# Patient Record
Sex: Female | Born: 1970 | Race: White | Hispanic: No | Smoking: Current every day smoker
Health system: Southern US, Community
[De-identification: ages and names within clinical notes are randomized; demographics above are authoritative.]

## PROBLEM LIST (undated history)

## (undated) DIAGNOSIS — N200 Calculus of kidney: Secondary | ICD-10-CM

## (undated) DIAGNOSIS — I219 Acute myocardial infarction, unspecified: Secondary | ICD-10-CM

## (undated) DIAGNOSIS — M1990 Unspecified osteoarthritis, unspecified site: Secondary | ICD-10-CM

## (undated) DIAGNOSIS — M546 Pain in thoracic spine: Secondary | ICD-10-CM

## (undated) DIAGNOSIS — I509 Heart failure, unspecified: Secondary | ICD-10-CM

## (undated) DIAGNOSIS — J439 Emphysema, unspecified: Secondary | ICD-10-CM

## (undated) DIAGNOSIS — G8929 Other chronic pain: Secondary | ICD-10-CM

## (undated) DIAGNOSIS — C801 Malignant (primary) neoplasm, unspecified: Secondary | ICD-10-CM

## (undated) DIAGNOSIS — M542 Cervicalgia: Secondary | ICD-10-CM

## (undated) DIAGNOSIS — M549 Dorsalgia, unspecified: Secondary | ICD-10-CM

## (undated) DIAGNOSIS — E119 Type 2 diabetes mellitus without complications: Secondary | ICD-10-CM

## (undated) DIAGNOSIS — R52 Pain, unspecified: Secondary | ICD-10-CM

## (undated) DIAGNOSIS — I2510 Atherosclerotic heart disease of native coronary artery without angina pectoris: Secondary | ICD-10-CM

## (undated) DIAGNOSIS — M797 Fibromyalgia: Secondary | ICD-10-CM

## (undated) DIAGNOSIS — M79642 Pain in left hand: Secondary | ICD-10-CM

## (undated) DIAGNOSIS — I10 Essential (primary) hypertension: Secondary | ICD-10-CM

## (undated) DIAGNOSIS — M79641 Pain in right hand: Secondary | ICD-10-CM

## (undated) DIAGNOSIS — M5412 Radiculopathy, cervical region: Secondary | ICD-10-CM

## (undated) HISTORY — DX: Atherosclerotic heart disease of native coronary artery without angina pectoris: I25.10

## (undated) HISTORY — PX: CARDIAC SURGERY: SHX584

## (undated) HISTORY — PX: TUBAL LIGATION: SHX77

## (undated) HISTORY — DX: Emphysema, unspecified: J43.9

## (undated) HISTORY — PX: LITHOTRIPSY: SUR834

## (undated) HISTORY — PX: SPINAL FUSION: SHX223

---

## 2008-02-07 ENCOUNTER — Emergency Department (HOSPITAL_COMMUNITY): Admission: EM | Admit: 2008-02-07 | Discharge: 2008-02-07 | Payer: Self-pay | Admitting: Emergency Medicine

## 2008-02-14 ENCOUNTER — Ambulatory Visit (HOSPITAL_COMMUNITY): Admission: RE | Admit: 2008-02-14 | Discharge: 2008-02-14 | Payer: Self-pay | Admitting: Urology

## 2008-03-29 ENCOUNTER — Ambulatory Visit (HOSPITAL_COMMUNITY): Admission: RE | Admit: 2008-03-29 | Discharge: 2008-03-29 | Payer: Self-pay | Admitting: Urology

## 2010-08-01 ENCOUNTER — Encounter: Payer: Self-pay | Admitting: Orthopedic Surgery

## 2010-08-20 ENCOUNTER — Encounter: Payer: Self-pay | Admitting: Orthopedic Surgery

## 2010-09-19 ENCOUNTER — Encounter: Payer: Self-pay | Admitting: Orthopedic Surgery

## 2010-10-20 ENCOUNTER — Encounter: Payer: Self-pay | Admitting: Orthopedic Surgery

## 2010-11-20 ENCOUNTER — Encounter: Payer: Self-pay | Admitting: Orthopedic Surgery

## 2010-12-19 ENCOUNTER — Encounter: Payer: Self-pay | Admitting: Orthopedic Surgery

## 2011-07-15 LAB — URINALYSIS, ROUTINE W REFLEX MICROSCOPIC
Nitrite: NEGATIVE
Protein, ur: 30 — AB
Specific Gravity, Urine: 1.025
Urobilinogen, UA: 0.2

## 2011-07-15 LAB — COMPREHENSIVE METABOLIC PANEL
BUN: 7
CO2: 25
Calcium: 9.2
Chloride: 104
Creatinine, Ser: 0.74
GFR calc non Af Amer: >60
Total Bilirubin: 0.3

## 2011-07-15 LAB — CBC
HCT: 40.7
MCHC: 34.9
MCV: 88.2
Platelets: 174
RBC: 4.61
WBC: 8

## 2011-07-15 LAB — DIFFERENTIAL
Basophils Absolute: 0
Lymphocytes Relative: 16
Lymphs Abs: 1.3
Neutro Abs: 6.3

## 2011-07-15 LAB — URINE MICROSCOPIC-ADD ON

## 2011-07-15 LAB — PREGNANCY, URINE: Preg Test, Ur: NEGATIVE

## 2011-07-15 LAB — LIPASE, BLOOD: Lipase: 24

## 2014-10-24 ENCOUNTER — Emergency Department (HOSPITAL_COMMUNITY): Payer: Self-pay

## 2014-10-24 ENCOUNTER — Inpatient Hospital Stay (HOSPITAL_COMMUNITY)
Admission: EM | Admit: 2014-10-24 | Discharge: 2014-10-26 | DRG: 247 | Disposition: A | Discharge status: Home / Self Care | Payer: Self-pay | Attending: Cardiology | Admitting: Cardiology

## 2014-10-24 ENCOUNTER — Encounter (HOSPITAL_COMMUNITY): Payer: Self-pay | Admitting: Emergency Medicine

## 2014-10-24 DIAGNOSIS — Z955 Presence of coronary angioplasty implant and graft: Secondary | ICD-10-CM

## 2014-10-24 DIAGNOSIS — E785 Hyperlipidemia, unspecified: Secondary | ICD-10-CM | POA: Diagnosis present

## 2014-10-24 DIAGNOSIS — Z6839 Body mass index (BMI) 39.0-39.9, adult: Secondary | ICD-10-CM

## 2014-10-24 DIAGNOSIS — F17210 Nicotine dependence, cigarettes, uncomplicated: Secondary | ICD-10-CM | POA: Diagnosis present

## 2014-10-24 DIAGNOSIS — Z981 Arthrodesis status: Secondary | ICD-10-CM

## 2014-10-24 DIAGNOSIS — M797 Fibromyalgia: Secondary | ICD-10-CM | POA: Diagnosis present

## 2014-10-24 DIAGNOSIS — I214 Non-ST elevation (NSTEMI) myocardial infarction: Principal | ICD-10-CM

## 2014-10-24 DIAGNOSIS — I2510 Atherosclerotic heart disease of native coronary artery without angina pectoris: Secondary | ICD-10-CM | POA: Diagnosis present

## 2014-10-24 DIAGNOSIS — Z8249 Family history of ischemic heart disease and other diseases of the circulatory system: Secondary | ICD-10-CM

## 2014-10-24 DIAGNOSIS — Z79899 Other long term (current) drug therapy: Secondary | ICD-10-CM

## 2014-10-24 DIAGNOSIS — E669 Obesity, unspecified: Secondary | ICD-10-CM | POA: Diagnosis present

## 2014-10-24 DIAGNOSIS — R079 Chest pain, unspecified: Secondary | ICD-10-CM | POA: Diagnosis present

## 2014-10-24 DIAGNOSIS — I10 Essential (primary) hypertension: Secondary | ICD-10-CM | POA: Diagnosis present

## 2014-10-24 DIAGNOSIS — Z720 Tobacco use: Secondary | ICD-10-CM | POA: Diagnosis present

## 2014-10-24 HISTORY — DX: Fibromyalgia: M79.7

## 2014-10-24 HISTORY — DX: Unspecified osteoarthritis, unspecified site: M19.90

## 2014-10-24 HISTORY — DX: Essential (primary) hypertension: I10

## 2014-10-24 LAB — CBC
HEMATOCRIT: 39.2 % (ref 36.0–46.0)
HEMOGLOBIN: 12.7 g/dL (ref 12.0–15.0)
MCH: 27.4 pg (ref 26.0–34.0)
MCHC: 32.4 g/dL (ref 30.0–36.0)
MCV: 84.5 fL (ref 78.0–100.0)
PLATELETS: 276 10*3/uL (ref 150–400)
RBC: 4.64 MIL/uL (ref 3.87–5.11)
RDW: 15.3 % (ref 11.5–15.5)
WBC: 13.8 10*3/uL — AB (ref 4.0–10.5)

## 2014-10-24 LAB — COMPREHENSIVE METABOLIC PANEL
ALT: 19 U/L (ref 0–35)
ANION GAP: 7 (ref 5–15)
AST: 22 U/L (ref 0–37)
Albumin: 3.8 g/dL (ref 3.5–5.2)
Alkaline Phosphatase: 89 U/L (ref 39–117)
BUN: 7 mg/dL (ref 6–23)
CALCIUM: 9 mg/dL (ref 8.4–10.5)
CO2: 26 mmol/L (ref 19–32)
CREATININE: 0.75 mg/dL (ref 0.50–1.10)
Chloride: 107 mEq/L (ref 96–112)
GLUCOSE: 110 mg/dL — AB (ref 70–99)
Potassium: 3.5 mmol/L (ref 3.5–5.1)
SODIUM: 140 mmol/L (ref 135–145)
TOTAL PROTEIN: 6.8 g/dL (ref 6.0–8.3)
Total Bilirubin: 0.2 mg/dL — ABNORMAL LOW (ref 0.3–1.2)

## 2014-10-24 LAB — TROPONIN I
Troponin I: 0.06 ng/mL — ABNORMAL HIGH (ref <0.031)
Troponin I: 0.37 ng/mL — ABNORMAL HIGH (ref <0.031)

## 2014-10-24 LAB — PROTIME-INR
INR: 1.1 (ref 0.00–1.49)
Prothrombin Time: 14.3 seconds (ref 11.6–15.2)

## 2014-10-24 LAB — APTT: aPTT: 28 seconds (ref 24–37)

## 2014-10-24 LAB — TSH: TSH: 2.092 u[IU]/mL (ref 0.350–4.500)

## 2014-10-24 MED ORDER — OXYCODONE-ACETAMINOPHEN 5-325 MG PO TABS
1.0000 | ORAL_TABLET | Freq: Three times a day (TID) | ORAL | Status: DC | PRN
  Administered 2014-10-25 – 2014-10-26 (×4): 1 via ORAL
  Filled 2014-10-24 (×4): qty 1

## 2014-10-24 MED ORDER — SODIUM CHLORIDE 0.9 % IJ SOLN: 3.0000 mL | Freq: Two times a day (BID) | INTRAMUSCULAR | Status: DC

## 2014-10-24 MED ORDER — MORPHINE SULFATE 4 MG/ML IJ SOLN: 4.0000 mg | INTRAMUSCULAR | Status: DC | PRN

## 2014-10-24 MED ORDER — OXYCODONE-ACETAMINOPHEN 10-325 MG PO TABS: 1.0000 | ORAL_TABLET | Freq: Three times a day (TID) | ORAL | Status: DC | PRN

## 2014-10-24 MED ORDER — ASPIRIN 81 MG PO CHEW
324.0000 mg | CHEWABLE_TABLET | Freq: Once | ORAL | Status: AC
  Administered 2014-10-24: 324 mg via ORAL
  Filled 2014-10-24: qty 4

## 2014-10-24 MED ORDER — HEPARIN SODIUM (PORCINE) 5000 UNIT/ML IJ SOLN
5000.0000 [IU] | Freq: Three times a day (TID) | INTRAMUSCULAR | Status: DC
  Administered 2014-10-24: 5000 [IU] via SUBCUTANEOUS
  Filled 2014-10-24: qty 1

## 2014-10-24 MED ORDER — DULOXETINE HCL 60 MG PO CPEP
60.0000 mg | ORAL_CAPSULE | Freq: Every day | ORAL | Status: DC
  Administered 2014-10-26: 11:00:00 60 mg via ORAL
  Filled 2014-10-24 (×2): qty 1

## 2014-10-24 MED ORDER — NITROGLYCERIN 0.4 MG SL SUBL
0.4000 mg | SUBLINGUAL_TABLET | SUBLINGUAL | Status: DC | PRN
  Administered 2014-10-24 (×2): 0.4 mg via SUBLINGUAL
  Filled 2014-10-24 (×2): qty 1

## 2014-10-24 MED ORDER — GABAPENTIN 400 MG PO CAPS
1200.0000 mg | ORAL_CAPSULE | Freq: Three times a day (TID) | ORAL | Status: DC
  Administered 2014-10-24 – 2014-10-26 (×4): 1200 mg via ORAL
  Filled 2014-10-24 (×9): qty 3

## 2014-10-24 MED ORDER — OXYCODONE HCL 5 MG PO TABS
5.0000 mg | ORAL_TABLET | Freq: Three times a day (TID) | ORAL | Status: DC | PRN
  Administered 2014-10-26: 10:00:00 5 mg via ORAL
  Filled 2014-10-24 (×2): qty 1

## 2014-10-24 MED ORDER — PANTOPRAZOLE SODIUM 40 MG PO TBEC
40.0000 mg | DELAYED_RELEASE_TABLET | Freq: Every day | ORAL | Status: DC
  Administered 2014-10-26: 40 mg via ORAL
  Filled 2014-10-24 (×2): qty 1

## 2014-10-24 MED ORDER — CYCLOBENZAPRINE HCL 10 MG PO TABS
10.0000 mg | ORAL_TABLET | Freq: Three times a day (TID) | ORAL | Status: DC | PRN
  Administered 2014-10-25: 10 mg via ORAL
  Filled 2014-10-24: qty 1

## 2014-10-24 MED ORDER — SODIUM CHLORIDE 0.9 % IV SOLN
1000.0000 mL | INTRAVENOUS | Status: DC
  Administered 2014-10-24: 1000 mL via INTRAVENOUS

## 2014-10-24 NOTE — ED Notes (Signed)
MD at bedside. 

## 2014-10-24 NOTE — ED Provider Notes (Signed)
CSN: [id number]     Arrival date & time 10/24/14  1719 History   First MD Initiated Contact with Patient 10/24/14 1810     Chief Complaint  Patient presents with  . Shoulder Pain     HPI Patient started developing pain in her right shoulder this afternoon while washing her door. She also developed pain in her right jaw as well as pain across her chest. Patient began feeling short of breath. She denies any trouble with nausea or vomiting. She denied any diaphoresis. Nothing seems to be helping. She does have history of fibromyalgia and chronic back pain. She denies any history of heart disease. She does smoke, has hypertension and there is a family history of heart disease in her mother. Past Medical History  Diagnosis Date  . Hypertension   . Arthritis   . Fibromyalgia    Past Surgical History  Procedure Laterality Date  . Spinal fusion      L5-S1  . Tubal ligation     Family History  Problem Relation Age of Onset  . Diabetes Mother   . Stroke Mother   . Seizures Other   . Cancer Other   . Diabetes Other    History  Substance Use Topics  . Smoking status: Current Every Day Smoker -- 1.00 packs/day for 24 years    Types: Cigarettes  . Smokeless tobacco: Never Used  . Alcohol Use: No   OB History    Gravida Para Term Preterm AB TAB SAB Ectopic Multiple Living   2 2 2       2      Review of Systems  All other systems reviewed and are negative.     Allergies  Prednisone  Home Medications   Prior to Admission medications   Medication Sig Start Date End Date Taking? Authorizing Provider  cyclobenzaprine (FLEXERIL) 10 MG tablet Take 10 mg by mouth 3 (three) times daily as needed for muscle spasms.   Yes Historical Provider, MD  DULoxetine (CYMBALTA) 60 MG capsule Take 60 mg by mouth daily.   Yes Historical Provider, MD  gabapentin (NEURONTIN) 600 MG tablet Take 1,200 mg by mouth 3 (three) times daily.   Yes Historical Provider, MD  omeprazole (PRILOSEC) 20 MG capsule  Take 20 mg by mouth daily.   Yes Historical Provider, MD  oxyCODONE-acetaminophen (PERCOCET) 10-325 MG per tablet Take 1 tablet by mouth 3 (three) times daily as needed for pain.   Yes Historical Provider, MD   BP 107/74 mmHg  Pulse 88  Temp(Src) 97.8 F (36.6 C) (Oral)  Resp 17  Ht 5\' 2"  (1.575 m)  Wt 210 lb 14.4 oz (95.664 kg)  BMI 38.56 kg/m2  SpO2 93%  LMP 10/10/2014 Physical Exam  Constitutional: She appears well-developed and well-nourished. No distress.  HENT:  Head: Normocephalic and atraumatic.  Right Ear: External ear normal.  Left Ear: External ear normal.  Eyes: Conjunctivae are normal. Right eye exhibits no discharge. Left eye exhibits no discharge. No scleral icterus.  Neck: Neck supple. No tracheal deviation present.  Cardiovascular: Normal rate, regular rhythm and intact distal pulses.   Pulmonary/Chest: Effort normal and breath sounds normal. No stridor. No respiratory distress. She has no wheezes. She has no rales.  Abdominal: Soft. Bowel sounds are normal. She exhibits no distension. There is no tenderness. There is no rebound and no guarding.  Musculoskeletal: She exhibits no edema.       Right shoulder: She exhibits tenderness. She exhibits normal range of motion  and no deformity.  Patient has some tenderness palpation her shoulder but there is full range of motion does not cause any pain or discomfort  Neurological: She is alert. She has normal strength. No cranial nerve deficit (no facial droop, extraocular movements intact, no slurred speech) or sensory deficit. She exhibits normal muscle tone. She displays no seizure activity. Coordination normal.  Skin: Skin is warm and dry. No rash noted.  Psychiatric: She has a normal mood and affect.  Nursing note and vitals reviewed.   ED Course  Procedures (including critical care time) Labs Review Labs Reviewed  CBC - Abnormal; Notable for the following:    WBC 13.8 (*)    All other components within normal  limits  COMPREHENSIVE METABOLIC PANEL - Abnormal; Notable for the following:    Glucose, Bld 110 (*)    Total Bilirubin 0.2 (*)    All other components within normal limits  TROPONIN I - Abnormal; Notable for the following:    Troponin I 0.06 (*)    All other components within normal limits  APTT  PROTIME-INR    Imaging Review Dg Chest Portable 1 View  10/24/2014   CLINICAL DATA:  Right-sided chest pain since 4:30 pm Today.  EXAM: PORTABLE CHEST - 1 VIEW  COMPARISON:  None.  FINDINGS: The cardiac silhouette, mediastinal and hilar contours are within normal limits. The lungs are clear. No pleural effusion. The bony thorax is intact.  IMPRESSION: No acute cardiopulmonary findings.   Electronically Signed   By: Loralie Champagne M.D.   On: 10/24/2014 18:38     EKG Interpretation   Date/Time:  Tuesday October 24 2014 17:55:03 EST Ventricular Rate:  98 PR Interval:  146 QRS Duration: 86 QT Interval:  358 QTC Calculation: 457 R Axis:   55 Text Interpretation:  Sinus rhythm with occasional Premature ventricular  complexes Otherwise normal ECG No old tracing to compare Confirmed by  Teela Narducci  MD-J, Jaelie Aguilera ([id number]) on 10/24/2014 6:10:29 PM     Medications  0.9 %  sodium chloride infusion (1,000 mLs Intravenous New Bag/Given 10/24/14 1833)  nitroGLYCERIN (NITROSTAT) SL tablet 0.4 mg (0.4 mg Sublingual Given 10/24/14 1948)  aspirin chewable tablet 324 mg (324 mg Oral Given 10/24/14 1833)    MDM   Final diagnoses:  None    Pt with symptoms concerning for the possibility of unstable angina.  Pt has a heart score of 5.  Pain free now in the ED.  Will consult with medical service regarding admission, cardiology consultation, serial enzymes.    Linwood Dibbles, MD 10/24/14 2029

## 2014-10-24 NOTE — H&P (Signed)
[facility] History and Physical  Tanya Lowe:[id number] DOB: 1971/08/06 DOA: 10/24/2014  Referring physician: ER PCP: No primary care provider on file.   Chief Complaint: Chest pain  HPI: Tanya Lowe is a 44 y.o. female  This is a 44 year old lady, smoker, who presents with history of right-sided shoulder pain associated with chest and jaw pain. She describes this as a tightness across her chest and jaw area. It began at 4:30 PM today and lasted until she got to the emergency room approximately one hour later when it was relieved with sublingual nitroglycerin. She has never had pain like this before. She says that the pain was associated with a degree of dyspnea but there was no nausea or sweating. She also has a history of fibromyalgia and chronic back pain. There is no palpitations, fever, cough. She is now being admitted for further evaluation.   Review of Systems:  Apart from symptoms above, all systems negative.  Past Medical History  Diagnosis Date  . Hypertension   . Arthritis   . Fibromyalgia    Past Surgical History  Procedure Laterality Date  . Spinal fusion      L5-S1  . Tubal ligation     Social History:  reports that she has been smoking Cigarettes.  She has a 24 pack-year smoking history. She has never used smokeless tobacco. She reports that she does not drink alcohol or use illicit drugs.  Allergies  Allergen Reactions  . Prednisone Hypertension    Tachycardia     Family History  Problem Relation Age of Onset  . Diabetes Mother   . Stroke Mother   . Seizures Other   . Cancer Other   . Diabetes Other      Prior to Admission medications   Medication Sig Start Date End Date Taking? Authorizing Provider  cyclobenzaprine (FLEXERIL) 10 MG tablet Take 10 mg by mouth 3 (three) times daily as needed for muscle spasms.   Yes Historical Provider, MD  DULoxetine (CYMBALTA) 60 MG capsule Take 60 mg by mouth daily.   Yes Historical Provider, MD    gabapentin (NEURONTIN) 600 MG tablet Take 1,200 mg by mouth 3 (three) times daily.   Yes Historical Provider, MD  omeprazole (PRILOSEC) 20 MG capsule Take 20 mg by mouth daily.   Yes Historical Provider, MD  oxyCODONE-acetaminophen (PERCOCET) 10-325 MG per tablet Take 1 tablet by mouth 3 (three) times daily as needed for pain.   Yes Historical Provider, MD   Physical Exam: Filed Vitals:   10/24/14 1816 10/24/14 1830 10/24/14 1900 10/24/14 1930  BP: 119/72 102/71 115/72 107/74  Pulse: 98 96 94 88  Temp:      TempSrc:      Resp: Height:      Weight:      SpO2: 97% 92% 95% 93%    Wt Readings from Last 3 Encounters:  10/24/14 95.664 kg (210 lb 14.4 oz)    General:  Appears calm and comfortable Eyes: PERRL, normal lids, irises & conjunctiva ENT: grossly normal hearing, lips & tongue Neck: no LAD, masses or thyromegaly Cardiovascular: RRR, no m/r/g. No LE edema. Telemetry: SR, no arrhythmias  Respiratory: CTA bilaterally, no w/r/r. Normal respiratory effort. Abdomen: soft, ntnd Skin: no rash or induration seen on limited exam Musculoskeletal: grossly normal tone BUE/BLE Psychiatric: grossly normal mood and affect, speech fluent and appropriate Neurologic: grossly non-focal.          Labs on Admission:  Basic Metabolic Panel:  Recent Labs Lab 10/24/14 1820  NA 140  K 3.5  CL 107  CO2 26  GLUCOSE 110*  BUN 7  CREATININE 0.75  CALCIUM 9.0   Liver Function Tests:  Recent Labs Lab 10/24/14 1820  AST 22  ALT 19  ALKPHOS 89  BILITOT 0.2*  PROT 6.8  ALBUMIN 3.8   No results for input(s): LIPASE, AMYLASE in the last 168 hours. No results for input(s): AMMONIA in the last 168 hours. CBC:  Recent Labs Lab 10/24/14 1820  WBC 13.8*  HGB 12.7  HCT 39.2  MCV 84.5  PLT 276   Cardiac Enzymes:  Recent Labs Lab 10/24/14 1820  TROPONINI 0.06*    BNP (last 3 results) No results for input(s): PROBNP in the last 8760 hours. CBG: No results for  input(s): GLUCAP in the last 168 hours.  Radiological Exams on Admission: Dg Chest Portable 1 View  10/24/2014   CLINICAL DATA:  Right-sided chest pain since 4:30 pm Today.  EXAM: PORTABLE CHEST - 1 VIEW  COMPARISON:  None.  FINDINGS: The cardiac silhouette, mediastinal and hilar contours are within normal limits. The lungs are clear. No pleural effusion. The bony thorax is intact.  IMPRESSION: No acute cardiopulmonary findings.   Electronically Signed   By: Loralie ChampagneMark  Gallerani M.D.   On: 10/24/2014 18:38    EKG: Independently reviewed. Normal sinus rhythm without any acute ST-T wave changes.  Assessment/Plan   1. Chest pain, possibly cardiac in origin. Initial troponin is slightly elevated. She does have risk factors for coronary artery disease including tobacco use, obesity, family history of coronary artery disease. We will cycle cardiac enzymes, repeat electrocardiogram in the morning and ask cardiology consultation.  Further recommendations will depend on patient's hospital progress.   Code Status: Full code   DVT Prophylaxis: Heparin.  Family Communication: I discussed the plan with the patient at the bedside.   Disposition Plan: Home when medically stable.   Time spent: 45 minutes.  [name],[name] [facility] Pager [phone].

## 2014-10-24 NOTE — ED Notes (Signed)
PT stated around 1630 this evening she was washing her door and felt a sudden pain in her right jaw and into her right shoulder and felt a little sob. PT stated she took her 1200 gabapentin, 10mg  flexeril, 10/325 percocet prior to ED arrival.

## 2014-10-24 NOTE — ED Notes (Signed)
Per patient was washing windows today when she felt a sudden pain in right side of jaw. Patient now reports pain mostly in right shoulder that radiates across right side of chest and into neck. Patient states "I just don't feel right." Denies any cardiac hx.

## 2014-10-25 ENCOUNTER — Encounter (HOSPITAL_COMMUNITY)
Admission: EM | Disposition: A | Discharge status: Home / Self Care | Payer: Self-pay | Source: Home / Self Care | Attending: Cardiology

## 2014-10-25 ENCOUNTER — Encounter (HOSPITAL_COMMUNITY): Payer: Self-pay

## 2014-10-25 ENCOUNTER — Ambulatory Visit (HOSPITAL_COMMUNITY): Admit: 2014-10-25 | Payer: Self-pay | Admitting: Cardiovascular Disease

## 2014-10-25 DIAGNOSIS — E785 Hyperlipidemia, unspecified: Secondary | ICD-10-CM

## 2014-10-25 DIAGNOSIS — I214 Non-ST elevation (NSTEMI) myocardial infarction: Principal | ICD-10-CM

## 2014-10-25 DIAGNOSIS — R7989 Other specified abnormal findings of blood chemistry: Secondary | ICD-10-CM

## 2014-10-25 DIAGNOSIS — I25110 Atherosclerotic heart disease of native coronary artery with unstable angina pectoris: Secondary | ICD-10-CM

## 2014-10-25 HISTORY — PX: LEFT HEART CATHETERIZATION WITH CORONARY ANGIOGRAM: SHX5451

## 2014-10-25 LAB — CBC
HCT: 38.1 % (ref 36.0–46.0)
Hemoglobin: 12.2 g/dL (ref 12.0–15.0)
MCH: 27.3 pg (ref 26.0–34.0)
MCHC: 32 g/dL (ref 30.0–36.0)
MCV: 85.2 fL (ref 78.0–100.0)
Platelets: 263 10*3/uL (ref 150–400)
RBC: 4.47 MIL/uL (ref 3.87–5.11)
RDW: 15.4 % (ref 11.5–15.5)
WBC: 7.4 10*3/uL (ref 4.0–10.5)

## 2014-10-25 LAB — COMPREHENSIVE METABOLIC PANEL
ALT: 18 U/L (ref 0–35)
ANION GAP: 7 (ref 5–15)
AST: 23 U/L (ref 0–37)
Albumin: 3.3 g/dL — ABNORMAL LOW (ref 3.5–5.2)
Alkaline Phosphatase: 84 U/L (ref 39–117)
BUN: 7 mg/dL (ref 6–23)
CALCIUM: 8.6 mg/dL (ref 8.4–10.5)
CO2: 26 mmol/L (ref 19–32)
CREATININE: 0.63 mg/dL (ref 0.50–1.10)
Chloride: 108 mEq/L (ref 96–112)
GFR calc Af Amer: >90 mL/min (ref >90)
GFR calc non Af Amer: >90 mL/min (ref >90)
Glucose, Bld: 94 mg/dL (ref 70–99)
POTASSIUM: 3.6 mmol/L (ref 3.5–5.1)
SODIUM: 141 mmol/L (ref 135–145)
Total Bilirubin: 0.3 mg/dL (ref 0.3–1.2)
Total Protein: 6.1 g/dL (ref 6.0–8.3)

## 2014-10-25 LAB — LIPID PANEL
Cholesterol: 121 mg/dL (ref 0–200)
HDL: 21 mg/dL — ABNORMAL LOW (ref >39)
LDL Cholesterol: 50 mg/dL (ref 0–99)
Total CHOL/HDL Ratio: 5.8 RATIO
Triglycerides: 251 mg/dL — ABNORMAL HIGH (ref <150)
VLDL: 50 mg/dL — ABNORMAL HIGH (ref 0–40)

## 2014-10-25 LAB — POCT ACTIVATED CLOTTING TIME
Activated Clotting Time: 270 seconds
Activated Clotting Time: 399 seconds

## 2014-10-25 LAB — HEMOGLOBIN A1C
HEMOGLOBIN A1C: 5.9 % — AB (ref <5.7)
Mean Plasma Glucose: 123 mg/dL — ABNORMAL HIGH (ref <117)

## 2014-10-25 LAB — TROPONIN I
Troponin I: 0.81 ng/mL (ref <0.031)
Troponin I: 0.8 ng/mL (ref <0.031)

## 2014-10-25 LAB — HEPARIN LEVEL (UNFRACTIONATED)

## 2014-10-25 SURGERY — LEFT HEART CATHETERIZATION WITH CORONARY ANGIOGRAM

## 2014-10-25 MED ORDER — SODIUM CHLORIDE 0.9 % IJ SOLN: 3.0000 mL | INTRAMUSCULAR | Status: DC | PRN

## 2014-10-25 MED ORDER — FENTANYL CITRATE 0.05 MG/ML IJ SOLN
INTRAMUSCULAR | Status: AC
  Filled 2014-10-25: qty 2

## 2014-10-25 MED ORDER — NICOTINE 14 MG/24HR TD PT24
14.0000 mg | MEDICATED_PATCH | Freq: Every day | TRANSDERMAL | Status: DC
  Administered 2014-10-25 – 2014-10-26 (×2): 14 mg via TRANSDERMAL
  Filled 2014-10-25 (×2): qty 1

## 2014-10-25 MED ORDER — HEPARIN (PORCINE) IN NACL 2-0.9 UNIT/ML-% IJ SOLN
INTRAMUSCULAR | Status: AC
  Filled 2014-10-25: qty 1000

## 2014-10-25 MED ORDER — SODIUM CHLORIDE 0.9 % IV SOLN: INTRAVENOUS | Status: AC

## 2014-10-25 MED ORDER — ASPIRIN 81 MG PO CHEW
81.0000 mg | CHEWABLE_TABLET | Freq: Every day | ORAL | Status: DC
  Administered 2014-10-25 – 2014-10-26 (×2): 81 mg via ORAL
  Filled 2014-10-25: qty 1

## 2014-10-25 MED ORDER — SODIUM CHLORIDE 0.9 % IV SOLN
INTRAVENOUS | Status: DC
  Administered 2014-10-25: 09:00:00 via INTRAVENOUS

## 2014-10-25 MED ORDER — SODIUM CHLORIDE 0.9 % IJ SOLN: 3.0000 mL | Freq: Two times a day (BID) | INTRAMUSCULAR | Status: DC

## 2014-10-25 MED ORDER — ATORVASTATIN CALCIUM 40 MG PO TABS
40.0000 mg | ORAL_TABLET | Freq: Every day | ORAL | Status: DC
  Administered 2014-10-25: 40 mg via ORAL
  Filled 2014-10-25: qty 1

## 2014-10-25 MED ORDER — METOPROLOL TARTRATE 12.5 MG HALF TABLET
12.5000 mg | ORAL_TABLET | Freq: Two times a day (BID) | ORAL | Status: DC
  Administered 2014-10-25 – 2014-10-26 (×3): 12.5 mg via ORAL
  Filled 2014-10-25 (×7): qty 1

## 2014-10-25 MED ORDER — NITROGLYCERIN 1 MG/10 ML FOR IR/CATH LAB
INTRA_ARTERIAL | Status: AC
  Filled 2014-10-25: qty 10

## 2014-10-25 MED ORDER — HEPARIN (PORCINE) IN NACL 100-0.45 UNIT/ML-% IJ SOLN
1000.0000 [IU]/h | INTRAMUSCULAR | Status: DC
  Administered 2014-10-25: 1000 [IU]/h via INTRAVENOUS
  Filled 2014-10-25: qty 250

## 2014-10-25 MED ORDER — TICAGRELOR 90 MG PO TABS
ORAL_TABLET | ORAL | Status: AC
  Filled 2014-10-25: qty 1

## 2014-10-25 MED ORDER — HEPARIN SODIUM (PORCINE) 1000 UNIT/ML IJ SOLN
INTRAMUSCULAR | Status: AC
  Filled 2014-10-25: qty 1

## 2014-10-25 MED ORDER — SODIUM CHLORIDE 0.9 % IV SOLN: 250.0000 mL | INTRAVENOUS | Status: DC | PRN

## 2014-10-25 MED ORDER — ATORVASTATIN CALCIUM 80 MG PO TABS
80.0000 mg | ORAL_TABLET | Freq: Every day | ORAL | Status: DC
  Administered 2014-10-25: 80 mg via ORAL
  Filled 2014-10-25 (×3): qty 1

## 2014-10-25 MED ORDER — MIDAZOLAM HCL 2 MG/2ML IJ SOLN
INTRAMUSCULAR | Status: AC
Start: 2014-10-25 — End: 2014-10-25
  Filled 2014-10-25: qty 2

## 2014-10-25 MED ORDER — VERAPAMIL HCL 2.5 MG/ML IV SOLN
INTRAVENOUS | Status: AC
  Filled 2014-10-25: qty 2

## 2014-10-25 MED ORDER — HEART ATTACK BOUNCING BOOK
Freq: Once | Status: AC
  Administered 2014-10-25: 23:00:00
  Filled 2014-10-25: qty 1

## 2014-10-25 MED ORDER — TICAGRELOR 90 MG PO TABS
90.0000 mg | ORAL_TABLET | Freq: Two times a day (BID) | ORAL | Status: DC
  Administered 2014-10-25 – 2014-10-26 (×2): 90 mg via ORAL
  Filled 2014-10-25 (×3): qty 1

## 2014-10-25 MED ORDER — LIDOCAINE HCL (PF) 1 % IJ SOLN
INTRAMUSCULAR | Status: AC
  Filled 2014-10-25: qty 30

## 2014-10-25 MED ORDER — ACTIVE PARTNERSHIP FOR HEALTH OF YOUR HEART BOOK
Freq: Once | Status: AC
  Administered 2014-10-25: 22:00:00
  Filled 2014-10-25: qty 1

## 2014-10-25 MED ORDER — ASPIRIN 81 MG PO CHEW
CHEWABLE_TABLET | ORAL | Status: AC
  Filled 2014-10-25: qty 1

## 2014-10-25 MED ORDER — MIDAZOLAM HCL 2 MG/2ML IJ SOLN
INTRAMUSCULAR | Status: AC
  Filled 2014-10-25: qty 2

## 2014-10-25 MED ORDER — ASPIRIN 81 MG PO CHEW: 81.0000 mg | CHEWABLE_TABLET | ORAL | Status: DC

## 2014-10-25 NOTE — Progress Notes (Signed)
UR completed 

## 2014-10-25 NOTE — Interval H&P Note (Signed)
History and Physical Interval Note:  10/25/2014 10:27 AM  Brieonna Liz MaladyM Hale  has presented today for cardiac cath with the diagnosis of chest pain, NSTEMI. The various methods of treatment have been discussed with the patient and family. After consideration of risks, benefits and other options for treatment, the patient has consented to  Procedure(s): LEFT HEART CATHETERIZATION WITH CORONARY ANGIOGRAM (N/A) as a surgical intervention .  The patient's history has been reviewed, patient examined, no change in status, stable for surgery.  I have reviewed the patient's chart and labs.  Questions were answered to the patient's satisfaction.    Cath Lab Visit (complete for each Cath Lab visit)  Clinical Evaluation Leading to the Procedure:   ACS: Yes.    Non-ACS:    Anginal Classification: CCS IV  Anti-ischemic medical therapy: No Therapy  Non-Invasive Test Results: No non-invasive testing performed  Prior CABG: No previous CABG         MCALHANY,CHRISTOPHER

## 2014-10-25 NOTE — Progress Notes (Signed)
44 yo female admitted earlier for chest pain. Troponins increasing. Pt not having any active chest pain, but given her age and risk factors for ACS, will start Heparin drip, Lipitor, and BB. Ordered lipid panel. Cardio consult in am. Continue to cycle enzymes.  Tanya NormanKaren Kirby Graham, NP [facility]

## 2014-10-25 NOTE — Progress Notes (Signed)
Critical Lab Value result: Troponin 0.81 Called Dr Selena BattenKim  No new orders given at this time. Dr Selena BattenKim said to just continue Heparin drip at this time. VSS. Pt denies chest pain or any pain @ this time. Continuing to monitor. Bed is in lowest position and call bell is within reach.

## 2014-10-25 NOTE — Progress Notes (Signed)
Nurse in room to assess pt, but pt wasn't in room. Nurse walked around the unit; pt was no where to be found. Called security, who said pt outside at road smoking. Security escorted pt back to unit. Nurse explained importance of staying on unit and that the hospital is a smoke free facility. Offered pt nicotine patch; pt refused. Pt irritable with staff. Will continue to monitor frequently throughout night. Other staff alerted about situation and asked to watch for her.

## 2014-10-25 NOTE — CV Procedure (Signed)
Cardiac Catheterization Operative Report  Tanya Lowe [id number] 1/6/201610:28 AM No primary care provider on file.  Procedure Performed:  1. Left Heart Catheterization 2. Selective Coronary Angiography 3. Left ventricular angiogram 4. PTCA/DES x 1 distal RCA 5. PTCA/DES x 1 mid RCA 6. PTCA/DES x 1 mid LAD  Operator: Verne Carrow, MD  Arterial access site:  Right radial artery.   Indication: 44 yo female with history of tobacco abuse admitted with unstable angina, NSTEMI to [facility]. Transferred to [facility] for cardiac cath.                                       Procedure Details: The risks, benefits, complications, treatment options, and expected outcomes were discussed with the patient. The patient and/or family concurred with the proposed plan, giving informed consent. The patient was brought to the cath lab after IV hydration was begun and oral premedication was given. The patient was further sedated with Versed and Fentanyl. The right wrist was assessed with a modified Allens test which was positive. The right wrist was prepped and draped in a sterile fashion. 1% lidocaine was used for local anesthesia. Using the modified Seldinger access technique, a 5 French sheath was placed in the right radial artery. 3 mg Verapamil was given through the sheath. 5000 units IV heparin was given. Standard diagnostic catheters were used to perform selective coronary angiography. A pigtail catheter was used to perform a left ventricular angiogram. She was found to have severe stenosis mid and distal RCA and a severe stenosis in the mid LAD. I elected to proceed to PCI of the RCA and LAD.    PCI Note: She was given an additional 5000 units IV heparin x 1. ACT was over 200. She was given Brilinta 180 mg po x 1.   Lesion # 1 Distal RCA: JR4 guiding catheter used to engage the RCA. Cougar IC wire down the RCA. 2.0 x 15 mm balloon x 1 for pre-dilatation. 2.25 x 16 mm Promus Premier DES x 1  distal RCA post-dilated with a 2.5 x 12 mm [place] balloon x 1. Stenosis taken from 90% down to 0%.   Lesion # 2 mid RCA: 2.0 x 15 mm balloon x 1. 3.0 x 12 mm Promus Premier DES x 1. The mid stenosis was followed by an aneurysmal segment with a size mismatch before the distal vessel so stents were not overlapped. Post-dilatation with a 3.5 x 8 mm [place] balloon x 1. Stenosis taken from 95% down to 0%.   Lesion #3 mid LAD: XB LAD 3.5 guiding catheter to engage left main. 2.5 x 12 mm balloon x 1 for pre-dilatation. 3.5 x 16 mm Promus Premier DES x 1 mid LAD. Stent post-dilated with a 3.5 x 8 mm [place] balloon x 2. Stenosis taken from 80% down to 0%.  The sheath was removed from the right radial artery and a Terumo hemostasis band was applied at the arteriotomy site on the right wrist. There were no immediate complications. The patient was taken to the recovery area in stable condition.   Hemodynamic Findings: Central aortic pressure: 115/66 Left ventricular pressure: 121/3/14  Angiographic Findings:  Left main: No obstructive disease.   Left Anterior Descending Artery: Large caliber vessel that courses to the apex. 80% mid stenosis. Small caliber diagonal branch. The distal LAD tapers to a small caliber vessel.   Circumflex  Artery: Large caliber vessel with 20% proximal stenosis.   Right Coronary Artery: Large dominant vessel with diffuse 30% proximal stenosis, 95% mid stenosis followed by an aneurysmal segment. Distal 90% stenosis. Diffuse mild plaque in the posterolateral artery and PDA.   Left Ventricular Angiogram: LVEF=55-60%.   Impression: 1. Severe double vessel CAD 2. NSTEMI 3. Normal LV systolic function 4. Successful PTCA/DES x 1 mid RCA and PTCA/DES x 1 distal RCA (stents not overlapped due to size mismatch in the vessel and the presence of an aneurysmal segment in the distal segment of the mid vessel) 5. Successful PTCA/DES x 1 mid LAD  Recommendations: Will need dual anti-platelet  therapy with ASA and Brilinta for at least one year. Continue beta blocker and statin. Tobacco cessation.        Complications:  None. The patient tolerated the procedure well.

## 2014-10-25 NOTE — Progress Notes (Signed)
Tanya Lowe XBM:[id number] DOB: 21-Apr-1971 DOA: 10/24/2014 PCP: No primary care provider on file.  Brief narrative: 44 year old female chronic smoker obese no cardiac history washing her window outside yesterday she experienced sharp onset shoulder pain which radiated to her right neck and into her jaw which lasted about 45 minutes, felt like a dull pain and had radiation down to her chest. No aggravating or relieving factors. Had to sit down and only then did some of the pressure ease off. EMS gave her nitroglycerin which completely resolved her pain. Admitted overnight to the hospital and troponins trended upward cardiology consulted and recommended cardiac catheterization  Past medical history-As per Problem list Chart reviewed as below- Reviewed  Consultants:  Cardiology will take over care of this patient as per my discussion with Dr. branch  Procedures:  Cardiac cath planned 10/25/14  Antibiotics:  None   Subjective  Feels better No further pain next line somewhat anxious Does not wish tobacco patch No shortness of breath About to be transported by [facility]   Objective    Interim History:   Telemetry: Sinus rhythm   Objective: Filed Vitals:   10/24/14 1900 10/24/14 1930 10/24/14 2133 10/25/14 0538  BP: 115/72 107/74 109/57 120/72  Pulse: 94 88 88 74  Temp:   98.3 F (36.8 C) 98.1 F (36.7 C)  TempSrc:   Oral Oral  Resp: Height:    (1.575 m)   Weight:   97.796 kg (215 lb 9.6 oz)   SpO2: 95% 93% 96% 95%    Intake/Output Summary (Last 24 hours) at 10/25/14 0901 Last data filed at 10/25/14 0538  Gross per 24 hour  Intake      0 ml  Output    400 ml  Net   -400 ml    Exam:  General: Alert anxious flat affect Cardiovascular: S1-S2 no murmur rub or gallop Respiratory: Clinically clear no added sound Abdomen: Soft nontender nondistended   Data Reviewed: Basic Metabolic Panel:  Recent Labs Lab 10/24/14 1820 10/25/14 0412  NA  140 141  K 3.5 3.6  CL 107 108  CO2 26 26  GLUCOSE 110* 94  BUN 7 7  CREATININE 0.75 0.63  CALCIUM 9.0 8.6   Liver Function Tests:  Recent Labs Lab 10/24/14 1820 10/25/14 0412  AST 22 23  ALT 19 18  ALKPHOS 89 84  BILITOT 0.2* 0.3  PROT 6.8 6.1  ALBUMIN 3.8 3.3*   No results for input(s): LIPASE, AMYLASE in the last 168 hours. No results for input(s): AMMONIA in the last 168 hours. CBC:  Recent Labs Lab 10/24/14 1820  WBC 13.8*  HGB 12.7  HCT 39.2  MCV 84.5  PLT 276   Cardiac Enzymes:  Recent Labs Lab 10/24/14 1820 10/24/14 2134 10/25/14 0412  TROPONINI 0.06* 0.37* 0.81*   BNP: Invalid input(s): POCBNP CBG: No results for input(s): GLUCAP in the last 168 hours.  No results found for this or any previous visit (from the past 240 hour(s)).   Studies:              All Imaging reviewed and is as per above notation   Scheduled Meds: . aspirin  81 mg Oral Daily  . atorvastatin  80 mg Oral q1800  . DULoxetine  60 mg Oral Daily  . gabapentin  1,200 mg Oral TID  . metoprolol tartrate  12.5 mg Oral BID  . pantoprazole  40 mg Oral Daily  . sodium chloride  3 mL Intravenous Q12H   Continuous Infusions: . sodium chloride 75 mL/hr at 10/25/14 0846  . heparin 1,000 Units/hr (10/25/14 0113)     Assessment/Plan: 1. Patient has new onset chest pain which is concerning for ischemic chest pain.  HEART score~5-6.  Her features are pretty customary. She will probably undergo cardiac cath as per Dr. branch/Dr. Sanjuana KavaMcalhaney discussion with her later today.  She seems to understand the seriousness of this 2. She does not wish tobacco patch and states she will quit on her own 3. Continue high intensity statin for hyperlipidemia   HOSPITALIST SERVICE TO SIGN OFF-TRANSFERRED TO CARDIOLOGY SERVICE    Pleas KochJai Chevelle Coulson, MD  [facility] Pager [phone] 10/25/2014, 9:01 AM    LOS: 1 day

## 2014-10-25 NOTE — Progress Notes (Signed)
Troponin increased from 0.06 to 0.37. Pt denying pain at this time. VSS. Notified Dr Craige CottaKirby.  0000 Dr Craige CottaKirby called nurse and is ordering Heparin drip at this time.  Continuing to monitor pt.

## 2014-10-25 NOTE — Progress Notes (Signed)
[facility] to [facility]. [facility] Patient without c/o pain.

## 2014-10-25 NOTE — Consult Note (Signed)
Reason for Consultchest pain Referring Physician: PTH Primary Care: Dr. Henrine Screws, [facility]. Cardiologist:[facility]  Tanya Lowe is an 44 y.o. female.  HPI: This is a 44 yr old female patient with no prior cardiac history. While washing windows yesterday she developed right shoulder pain followed by chest tightness with severe jaw pain. Pain eased with 2 NTG in ER. Never had pain like this before. EKG Nonspecific ST changes. Troponins positive x 3. 0.06 0.37 , 0.81. Patient is a smoker(1ppd x 25 yrs), family history of CAD(mom MI 52), hyperlipidemia. No HTN or DM. She has chronic back pain and fibromyalgia. Recent viral URI.   Past Medical History  Diagnosis Date  . Hypertension   . Arthritis   . Fibromyalgia     Past Surgical History  Procedure Laterality Date  . Spinal fusion      L5-S1  . Tubal ligation      Family History  Problem Relation Age of Onset  . Diabetes Mother   . Stroke Mother   . Seizures Other   . Cancer Other   . Diabetes Other     Social History:  reports that she has been smoking Cigarettes.  She has a 24 pack-year smoking history. She has never used smokeless tobacco. She reports that she does not drink alcohol or use illicit drugs.  Allergies:  Allergies  Allergen Reactions  . Prednisone Hypertension    Tachycardia     Medications: Scheduled Meds: . atorvastatin  40 mg Oral q1800  . DULoxetine  60 mg Oral Daily  . gabapentin  1,200 mg Oral TID  . metoprolol tartrate  12.5 mg Oral BID  . pantoprazole  40 mg Oral Daily  . sodium chloride  3 mL Intravenous Q12H   Continuous Infusions: . heparin 1,000 Units/hr (10/25/14 0113)   PRN Meds:.cyclobenzaprine, morphine injection, nitroGLYCERIN, oxyCODONE-acetaminophen **AND** oxyCODONE   Results for orders placed or performed during the hospital encounter of 10/24/14 (from the past 48 hour(s))  APTT     Status: None   Collection Time: 10/24/14  6:20 PM  Result Value Ref Range    aPTT 28 24 - 37 seconds  CBC     Status: Abnormal   Collection Time: 10/24/14  6:20 PM  Result Value Ref Range   WBC 13.8 (H) 4.0 - 10.5 K/uL   RBC 4.64 3.87 - 5.11 MIL/uL   Hemoglobin 12.7 12.0 - 15.0 g/dL   HCT 39.2 36.0 - 46.0 %   MCV 84.5 78.0 - 100.0 fL   MCH 27.4 26.0 - 34.0 pg   MCHC 32.4 30.0 - 36.0 g/dL   RDW 15.3 11.5 - 15.5 %   Platelets 276 150 - 400 K/uL  Comprehensive metabolic panel     Status: Abnormal   Collection Time: 10/24/14  6:20 PM  Result Value Ref Range   Sodium 140 135 - 145 mmol/L    Comment: Please note change in reference range.   Potassium 3.5 3.5 - 5.1 mmol/L    Comment: Please note change in reference range.   Chloride 107 96 - 112 mEq/L   CO2 26 19 - 32 mmol/L   Glucose, Bld 110 (H) 70 - 99 mg/dL   BUN 7 6 - 23 mg/dL   Creatinine, Ser 0.75 0.50 - 1.10 mg/dL   Calcium 9.0 8.4 - 10.5 mg/dL   Total Protein 6.8 6.0 - 8.3 g/dL   Albumin 3.8 3.5 - 5.2 g/dL   AST 22 0 - 37 U/L  ALT 19 0 - 35 U/L   Alkaline Phosphatase 89 39 - 117 U/L   Total Bilirubin 0.2 (L) 0.3 - 1.2 mg/dL   GFR calc non Af Amer >90 >90 mL/min   GFR calc Af Amer >90 >90 mL/min    Comment: (NOTE) The eGFR has been calculated using the CKD EPI equation. This calculation has not been validated in all clinical situations. eGFR's persistently <90 mL/min signify possible Chronic Kidney Disease.    Anion gap 7 5 - 15  Protime-INR     Status: None   Collection Time: 10/24/14  6:20 PM  Result Value Ref Range   Prothrombin Time 14.3 11.6 - 15.2 seconds   INR 1.10 0.00 - 1.49  Troponin I (order at [facility])     Status: Abnormal   Collection Time: 10/24/14  6:20 PM  Result Value Ref Range   Troponin I 0.06 (H) <0.031 ng/mL    Comment:        PERSISTENTLY INCREASED TROPONIN VALUES IN THE RANGE OF 0.04-0.49 ng/mL CAN BE SEEN IN:       -UNSTABLE ANGINA       -CONGESTIVE HEART FAILURE       -MYOCARDITIS       -CHEST TRAUMA       -ARRYHTHMIAS       -LATE PRESENTING MYOCARDIAL  INFARCTION       -COPD   CLINICAL FOLLOW-UP RECOMMENDED. Please note change in reference range.   TSH     Status: None   Collection Time: 10/24/14  9:34 PM  Result Value Ref Range   TSH 2.092 0.350 - 4.500 uIU/mL  Troponin I     Status: Abnormal   Collection Time: 10/24/14  9:34 PM  Result Value Ref Range   Troponin I 0.37 (H) <0.031 ng/mL    Comment:        PERSISTENTLY INCREASED TROPONIN VALUES IN THE RANGE OF 0.04-0.49 ng/mL CAN BE SEEN IN:       -UNSTABLE ANGINA       -CONGESTIVE HEART FAILURE       -MYOCARDITIS       -CHEST TRAUMA       -ARRYHTHMIAS       -LATE PRESENTING MYOCARDIAL INFARCTION       -COPD   CLINICAL FOLLOW-UP RECOMMENDED. Please note change in reference range.   Lipid panel     Status: Abnormal   Collection Time: 10/25/14 12:04 AM  Result Value Ref Range   Cholesterol 121 0 - 200 mg/dL   Triglycerides 251 (H) <150 mg/dL   HDL 21 (L) >39 mg/dL   Total CHOL/HDL Ratio 5.8 RATIO   VLDL 50 (H) 0 - 40 mg/dL   LDL Cholesterol 50 0 - 99 mg/dL    Comment:        Total Cholesterol/HDL:CHD Risk Coronary Heart Disease Risk Table                     Men   Women  1/2 Average Risk   3.4   3.3  Average Risk       5.0   4.4  2 X Average Risk   9.6   7.1  3 X Average Risk  23.4   11.0        Use the calculated Patient Ratio above and the CHD Risk Table to determine the patient's CHD Risk.        ATP III CLASSIFICATION (LDL):  <100     mg/dL  Optimal  100-129  mg/dL   Near or Above                    Optimal  130-159  mg/dL   Borderline  160-189  mg/dL   High  >190     mg/dL   Very High   Troponin I     Status: Abnormal   Collection Time: 10/25/14  4:12 AM  Result Value Ref Range   Troponin I 0.81 (HH) <0.031 ng/mL    Comment: CRITICAL RESULT CALLED TO, READ BACK BY AND VERIFIED WITH: HANCOCK,C AT 6:10AM ON 10/25/13 BY FESTERMAN,C        POSSIBLE MYOCARDIAL ISCHEMIA. SERIAL TESTING RECOMMENDED. Please note change in reference range.    Comprehensive metabolic panel     Status: Abnormal   Collection Time: 10/25/14  4:12 AM  Result Value Ref Range   Sodium 141 135 - 145 mmol/L    Comment: Please note change in reference range.   Potassium 3.6 3.5 - 5.1 mmol/L    Comment: Please note change in reference range.   Chloride 108 96 - 112 mEq/L   CO2 26 19 - 32 mmol/L   Glucose, Bld 94 70 - 99 mg/dL   BUN 7 6 - 23 mg/dL   Creatinine, Ser 0.63 0.50 - 1.10 mg/dL   Calcium 8.6 8.4 - 10.5 mg/dL   Total Protein 6.1 6.0 - 8.3 g/dL   Albumin 3.3 (L) 3.5 - 5.2 g/dL   AST 23 0 - 37 U/L   ALT 18 0 - 35 U/L   Alkaline Phosphatase 84 39 - 117 U/L   Total Bilirubin 0.3 0.3 - 1.2 mg/dL   GFR calc non Af Amer >90 >90 mL/min   GFR calc Af Amer >90 >90 mL/min    Comment: (NOTE) The eGFR has been calculated using the CKD EPI equation. This calculation has not been validated in all clinical situations. eGFR's persistently <90 mL/min signify possible Chronic Kidney Disease.    Anion gap 7 5 - 15    Dg Chest Portable 1 View  10/24/2014   CLINICAL DATA:  Right-sided chest pain since 4:30 pm Today.  EXAM: PORTABLE CHEST - 1 VIEW  COMPARISON:  None.  FINDINGS: The cardiac silhouette, mediastinal and hilar contours are within normal limits. The lungs are clear. No pleural effusion. The bony thorax is intact.  IMPRESSION: No acute cardiopulmonary findings.   Electronically Signed   By: Kalman Jewels M.D.   On: 10/24/2014 18:38    ROS  See HPI Eyes: Negative Ears:Negative for hearing loss, tinnitus Cardiovascular: Negative for  palpitations,irregular heartbeat, dyspnea, , near-syncope, orthopnea, paroxysmal nocturnal dyspnea and syncope,edema, claudication, cyanosis,.  Respiratory: recent URI, but clearing.Some DOE.  Negative for cough, hemoptysis, shortness of breath, sleep disturbances due to breathing, sputum production and wheezing.   Endocrine: Negative for cold intolerance and heat intolerance.  Hematologic/Lymphatic: Negative for  adenopathy and bleeding problem. Does not bruise/bleed easily.  Musculoskeletal: chronic back pain & fibromyalgia.   Gastrointestinal: Negative for nausea, vomiting, reflux, abdominal pain, diarrhea, constipation.   Genitourinary: Negative for bladder incontinence, dysuria, flank pain, frequency, hematuria, hesitancy, nocturia and urgency.  Neurological: Negative.  Allergic/Immunologic: Negative for environmental allergies.  Blood pressure 120/72, pulse 74, temperature 98.1 F (36.7 C), temperature source Oral, resp. rate 18, height 5' 2"  (1.575 m), weight 215 lb 9.6 oz (97.796 kg), last menstrual period 10/10/2014, SpO2 95 %. Physical Exam  PHYSICAL EXAM: Obese, in no acute distress. Neck:  No JVD, HJR, Bruit, or thyroid enlargement Lungs: No tachypnea, clear without wheezing, rales, or rhonchi Cardiovascular: RRR, PMI not displaced, heart sounds normal, no murmurs, gallops, bruit, thrill, or heave. Abdomen: BS normal. Soft without organomegaly, masses, lesions or tenderness. Extremities: without cyanosis, clubbing or edema. Good distal pulses bilateral SKin: Warm, no lesions or rashes  Musculoskeletal: No deformities Neuro: no focal signs     Assessment/Plan: NSTEMI: Elevated Troponins.Recommend transfer to [facility] for cardiac cath. Discussed risks and benefits with patient who is agreeable. Will arranged. Check 2Decho for LV function.  Smoker: smoking cessation discussed.  Hyperlipidemia: statin started  Family history of CAD  Fibromyalgia  Ermalinda Barrios 10/25/2014, 7:39 AM   Attending Note Patient seen and discussed with PA Bonnell Public, I agree with her documentation above. 44 yo female with no prior cardiac history admitted with chest and jaw pain, improved with SL NG. Trop up to 0.81 and has not yet peaked, repeat EKG with some lateral TWIs. Presentation consistent with NSTEMI, we are arranging transfer to [facility] for cath. She is on statin, hep gtt, ASA, beta blocker. Defer  additional antiplatelet agent loading until time of cath. Will need echo, pending hemodynamics consider adding ACE-I in near future.Will need echo to eval LVEF. F/u HgbA1c and lipid panel.   Zandra Abts MD

## 2014-10-25 NOTE — Progress Notes (Signed)
ANTICOAGULATION CONSULT NOTE - Initial Consult  Pharmacy Consult for Heparin Indication: chest pain/ACS  Allergies  Allergen Reactions  . Prednisone Hypertension    Tachycardia     Patient Measurements: Height: 5\' 2"  (157.5 cm) Weight: 215 lb 9.6 oz (97.796 kg) IBW/kg (Calculated) : 50.1 Heparin Dosing Weight: 73 kg  Vital Signs: Temp: 98.1 F (36.7 C) (01/06 0538) Temp Source: Oral (01/06 0538) BP: 120/72 mmHg (01/06 0538) Pulse Rate: 74 (01/06 0538)  Labs:  Recent Labs  10/24/14 1820 10/24/14 2134 10/25/14 0412  HGB 12.7  --   --   HCT 39.2  --   --   PLT 276  --   --   APTT 28  --   --   LABPROT 14.3  --   --   INR 1.10  --   --   CREATININE 0.75  --  0.63  TROPONINI 0.06* 0.37* 0.81*    Estimated Creatinine Clearance: 96 mL/min (by C-G formula based on Cr of 0.63).   Medical History: Past Medical History  Diagnosis Date  . Hypertension   . Arthritis   . Fibromyalgia     Medications:  Scheduled:  . atorvastatin  40 mg Oral q1800  . DULoxetine  60 mg Oral Daily  . gabapentin  1,200 mg Oral TID  . metoprolol tartrate  12.5 mg Oral BID  . pantoprazole  40 mg Oral Daily  . sodium chloride  3 mL Intravenous Q12H    Assessment: Tanya Lowe presented with chest pain, multiple risk factors for CAD.  Asked to initiate heparin for increasing troponins.   CBC reviewed.  No bleeding noted.  Patient received 5000 units sq heparin at 2329 so will defer heparin bolus.   Goal of Therapy:  Heparin level 0.3-0.7 units/ml Monitor platelets by anticoagulation protocol: Yes   Plan:  No bolus Start heparin infusion at 1000 units/hr Check anti-Xa level in 6 hours and daily while on heparin Continue to monitor H&H and platelets  F/U cardiology recommendations  Elson ClanLilliston, Malaijah Houchen Michelle 10/25/2014,7:30 AM

## 2014-10-25 NOTE — H&P (View-Only) (Signed)
Reason for Consultchest pain Referring Physician: PTH Primary Care: Dr. Henrine Screws, [facility]. Cardiologist:new-[facility]  Tanya Lowe is an 44 y.o. female.  HPI: This is a 44 yr old female patient with no prior cardiac history. While washing windows yesterday she developed right shoulder pain followed by chest tightness with severe jaw pain. Pain eased with 2 NTG in ER. Never had pain like this before. EKG Nonspecific ST changes. Troponins positive x 3. 0.06 0.37 , 0.81. Patient is a smoker(1ppd x 25 yrs), family history of CAD(mom MI 100), hyperlipidemia. No HTN or DM. She has chronic back pain and fibromyalgia. Recent viral URI.   Past Medical History  Diagnosis Date  . Hypertension   . Arthritis   . Fibromyalgia     Past Surgical History  Procedure Laterality Date  . Spinal fusion      L5-S1  . Tubal ligation      Family History  Problem Relation Age of Onset  . Diabetes Mother   . Stroke Mother   . Seizures Other   . Cancer Other   . Diabetes Other     Social History:  reports that she has been smoking Cigarettes.  She has a 24 pack-year smoking history. She has never used smokeless tobacco. She reports that she does not drink alcohol or use illicit drugs.  Allergies:  Allergies  Allergen Reactions  . Prednisone Hypertension    Tachycardia     Medications: Scheduled Meds: . atorvastatin  40 mg Oral q1800  . DULoxetine  60 mg Oral Daily  . gabapentin  1,200 mg Oral TID  . metoprolol tartrate  12.5 mg Oral BID  . pantoprazole  40 mg Oral Daily  . sodium chloride  3 mL Intravenous Q12H   Continuous Infusions: . heparin 1,000 Units/hr (10/25/14 0113)   PRN Meds:.cyclobenzaprine, morphine injection, nitroGLYCERIN, oxyCODONE-acetaminophen **AND** oxyCODONE   Results for orders placed or performed during the hospital encounter of 10/24/14 (from the past 48 hour(s))  APTT     Status: None   Collection Time: 10/24/14  6:20 PM  Result Value Ref Range    aPTT 28 24 - 37 seconds  CBC     Status: Abnormal   Collection Time: 10/24/14  6:20 PM  Result Value Ref Range   WBC 13.8 (H) 4.0 - 10.5 K/uL   RBC 4.64 3.87 - 5.11 MIL/uL   Hemoglobin 12.7 12.0 - 15.0 g/dL   HCT 39.2 36.0 - 46.0 %   MCV 84.5 78.0 - 100.0 fL   MCH 27.4 26.0 - 34.0 pg   MCHC 32.4 30.0 - 36.0 g/dL   RDW 15.3 11.5 - 15.5 %   Platelets 276 150 - 400 K/uL  Comprehensive metabolic panel     Status: Abnormal   Collection Time: 10/24/14  6:20 PM  Result Value Ref Range   Sodium 140 135 - 145 mmol/L    Comment: Please note change in reference range.   Potassium 3.5 3.5 - 5.1 mmol/L    Comment: Please note change in reference range.   Chloride 107 96 - 112 mEq/L   CO2 26 19 - 32 mmol/L   Glucose, Bld 110 (H) 70 - 99 mg/dL   BUN 7 6 - 23 mg/dL   Creatinine, Ser 0.75 0.50 - 1.10 mg/dL   Calcium 9.0 8.4 - 10.5 mg/dL   Total Protein 6.8 6.0 - 8.3 g/dL   Albumin 3.8 3.5 - 5.2 g/dL   AST 22 0 - 37 U/L  ALT 19 0 - 35 U/L   Alkaline Phosphatase 89 39 - 117 U/L   Total Bilirubin 0.2 (L) 0.3 - 1.2 mg/dL   GFR calc non Af Amer >90 >90 mL/min   GFR calc Af Amer >90 >90 mL/min    Comment: (NOTE) The eGFR has been calculated using the CKD EPI equation. This calculation has not been validated in all clinical situations. eGFR's persistently <90 mL/min signify possible Chronic Kidney Disease.    Anion gap 7 5 - 15  Protime-INR     Status: None   Collection Time: 10/24/14  6:20 PM  Result Value Ref Range   Prothrombin Time 14.3 11.6 - 15.2 seconds   INR 1.10 0.00 - 1.49  Troponin I (order at [facility])     Status: Abnormal   Collection Time: 10/24/14  6:20 PM  Result Value Ref Range   Troponin I 0.06 (H) <0.031 ng/mL    Comment:        PERSISTENTLY INCREASED TROPONIN VALUES IN THE RANGE OF 0.04-0.49 ng/mL CAN BE SEEN IN:       -UNSTABLE ANGINA       -CONGESTIVE HEART FAILURE       -MYOCARDITIS       -CHEST TRAUMA       -ARRYHTHMIAS       -LATE PRESENTING MYOCARDIAL  INFARCTION       -COPD   CLINICAL FOLLOW-UP RECOMMENDED. Please note change in reference range.   TSH     Status: None   Collection Time: 10/24/14  9:34 PM  Result Value Ref Range   TSH 2.092 0.350 - 4.500 uIU/mL  Troponin I     Status: Abnormal   Collection Time: 10/24/14  9:34 PM  Result Value Ref Range   Troponin I 0.37 (H) <0.031 ng/mL    Comment:        PERSISTENTLY INCREASED TROPONIN VALUES IN THE RANGE OF 0.04-0.49 ng/mL CAN BE SEEN IN:       -UNSTABLE ANGINA       -CONGESTIVE HEART FAILURE       -MYOCARDITIS       -CHEST TRAUMA       -ARRYHTHMIAS       -LATE PRESENTING MYOCARDIAL INFARCTION       -COPD   CLINICAL FOLLOW-UP RECOMMENDED. Please note change in reference range.   Lipid panel     Status: Abnormal   Collection Time: 10/25/14 12:04 AM  Result Value Ref Range   Cholesterol 121 0 - 200 mg/dL   Triglycerides 251 (H) <150 mg/dL   HDL 21 (L) >39 mg/dL   Total CHOL/HDL Ratio 5.8 RATIO   VLDL 50 (H) 0 - 40 mg/dL   LDL Cholesterol 50 0 - 99 mg/dL    Comment:        Total Cholesterol/HDL:CHD Risk Coronary Heart Disease Risk Table                     Men   Women  1/2 Average Risk   3.4   3.3  Average Risk       5.0   4.4  2 X Average Risk   9.6   7.1  3 X Average Risk  23.4   11.0        Use the calculated Patient Ratio above and the CHD Risk Table to determine the patient's CHD Risk.        ATP III CLASSIFICATION (LDL):  <100     mg/dL  Optimal  100-129  mg/dL   Near or Above                    Optimal  130-159  mg/dL   Borderline  160-189  mg/dL   High  >190     mg/dL   Very High   Troponin I     Status: Abnormal   Collection Time: 10/25/14  4:12 AM  Result Value Ref Range   Troponin I 0.81 (HH) <0.031 ng/mL    Comment: CRITICAL RESULT CALLED TO, READ BACK BY AND VERIFIED WITH: HANCOCK,C AT 6:10AM ON 10/25/13 BY FESTERMAN,C        POSSIBLE MYOCARDIAL ISCHEMIA. SERIAL TESTING RECOMMENDED. Please note change in reference range.    Comprehensive metabolic panel     Status: Abnormal   Collection Time: 10/25/14  4:12 AM  Result Value Ref Range   Sodium 141 135 - 145 mmol/L    Comment: Please note change in reference range.   Potassium 3.6 3.5 - 5.1 mmol/L    Comment: Please note change in reference range.   Chloride 108 96 - 112 mEq/L   CO2 26 19 - 32 mmol/L   Glucose, Bld 94 70 - 99 mg/dL   BUN 7 6 - 23 mg/dL   Creatinine, Ser 0.63 0.50 - 1.10 mg/dL   Calcium 8.6 8.4 - 10.5 mg/dL   Total Protein 6.1 6.0 - 8.3 g/dL   Albumin 3.3 (L) 3.5 - 5.2 g/dL   AST 23 0 - 37 U/L   ALT 18 0 - 35 U/L   Alkaline Phosphatase 84 39 - 117 U/L   Total Bilirubin 0.3 0.3 - 1.2 mg/dL   GFR calc non Af Amer >90 >90 mL/min   GFR calc Af Amer >90 >90 mL/min    Comment: (NOTE) The eGFR has been calculated using the CKD EPI equation. This calculation has not been validated in all clinical situations. eGFR's persistently <90 mL/min signify possible Chronic Kidney Disease.    Anion gap 7 5 - 15    Dg Chest Portable 1 View  10/24/2014   CLINICAL DATA:  Right-sided chest pain since 4:30 pm Today.  EXAM: PORTABLE CHEST - 1 VIEW  COMPARISON:  None.  FINDINGS: The cardiac silhouette, mediastinal and hilar contours are within normal limits. The lungs are clear. No pleural effusion. The bony thorax is intact.  IMPRESSION: No acute cardiopulmonary findings.   Electronically Signed   By: Kalman Jewels M.D.   On: 10/24/2014 18:38    ROS  See HPI Eyes: Negative Ears:Negative for hearing loss, tinnitus Cardiovascular: Negative for  palpitations,irregular heartbeat, dyspnea, , near-syncope, orthopnea, paroxysmal nocturnal dyspnea and syncope,edema, claudication, cyanosis,.  Respiratory: recent URI, but clearing.Some DOE.  Negative for cough, hemoptysis, shortness of breath, sleep disturbances due to breathing, sputum production and wheezing.   Endocrine: Negative for cold intolerance and heat intolerance.  Hematologic/Lymphatic: Negative for  adenopathy and bleeding problem. Does not bruise/bleed easily.  Musculoskeletal: chronic back pain & fibromyalgia.   Gastrointestinal: Negative for nausea, vomiting, reflux, abdominal pain, diarrhea, constipation.   Genitourinary: Negative for bladder incontinence, dysuria, flank pain, frequency, hematuria, hesitancy, nocturia and urgency.  Neurological: Negative.  Allergic/Immunologic: Negative for environmental allergies.  Blood pressure 120/72, pulse 74, temperature 98.1 F (36.7 C), temperature source Oral, resp. rate 18, height 5' 2"  (1.575 m), weight 215 lb 9.6 oz (97.796 kg), last menstrual period 10/10/2014, SpO2 95 %. Physical Exam  PHYSICAL EXAM: Obese, in no acute distress. Neck:  No JVD, HJR, Bruit, or thyroid enlargement Lungs: No tachypnea, clear without wheezing, rales, or rhonchi Cardiovascular: RRR, PMI not displaced, heart sounds normal, no murmurs, gallops, bruit, thrill, or heave. Abdomen: BS normal. Soft without organomegaly, masses, lesions or tenderness. Extremities: without cyanosis, clubbing or edema. Good distal pulses bilateral SKin: Warm, no lesions or rashes  Musculoskeletal: No deformities Neuro: no focal signs     Assessment/Plan: NSTEMI: Elevated Troponins.Recommend transfer to [facility] for cardiac cath. Discussed risks and benefits with patient who is agreeable. Will arranged. Check 2Decho for LV function.  Smoker: smoking cessation discussed.  Hyperlipidemia: statin started  Family history of CAD  Fibromyalgia  Ermalinda Barrios 10/25/2014, 7:39 AM   Attending Note Patient seen and discussed with PA Bonnell Public, I agree with her documentation above. 44 yo female with no prior cardiac history admitted with chest and jaw pain, improved with SL NG. Trop up to 0.81 and has not yet peaked, repeat EKG with some lateral TWIs. Presentation consistent with NSTEMI, we are arranging transfer to [facility] for cath. She is on statin, hep gtt, ASA, beta blocker. Defer  additional antiplatelet agent loading until time of cath. Will need echo, pending hemodynamics consider adding ACE-I in near future.Will need echo to eval LVEF. F/u HgbA1c and lipid panel.   Zandra Abts MD

## 2014-10-26 LAB — CBC
HCT: 38.7 % (ref 36.0–46.0)
Hemoglobin: 12.2 g/dL (ref 12.0–15.0)
MCH: 26.3 pg (ref 26.0–34.0)
MCHC: 31.5 g/dL (ref 30.0–36.0)
MCV: 83.4 fL (ref 78.0–100.0)
Platelets: 237 10*3/uL (ref 150–400)
RBC: 4.64 MIL/uL (ref 3.87–5.11)
RDW: 15.2 % (ref 11.5–15.5)
WBC: 6.5 10*3/uL (ref 4.0–10.5)

## 2014-10-26 LAB — BASIC METABOLIC PANEL
Anion gap: 4 — ABNORMAL LOW (ref 5–15)
BUN: 5 mg/dL — AB (ref 6–23)
CHLORIDE: 108 meq/L (ref 96–112)
CO2: 28 mmol/L (ref 19–32)
CREATININE: 0.69 mg/dL (ref 0.50–1.10)
Calcium: 9 mg/dL (ref 8.4–10.5)
GFR calc Af Amer: >90 mL/min (ref >90)
GFR calc non Af Amer: >90 mL/min (ref >90)
GLUCOSE: 106 mg/dL — AB (ref 70–99)
POTASSIUM: 4.3 mmol/L (ref 3.5–5.1)
Sodium: 140 mmol/L (ref 135–145)

## 2014-10-26 MED ORDER — METOPROLOL TARTRATE 25 MG PO TABS: 12.5000 mg | ORAL_TABLET | Freq: Two times a day (BID) | ORAL | Status: DC

## 2014-10-26 MED ORDER — ATORVASTATIN CALCIUM 80 MG PO TABS: 80.0000 mg | ORAL_TABLET | Freq: Every day | ORAL | Status: AC

## 2014-10-26 MED ORDER — TICAGRELOR 90 MG PO TABS: 90.0000 mg | ORAL_TABLET | Freq: Two times a day (BID) | ORAL | Status: DC

## 2014-10-26 MED ORDER — ASPIRIN 81 MG PO CHEW: 81.0000 mg | CHEWABLE_TABLET | Freq: Every day | ORAL | Status: DC

## 2014-10-26 MED ORDER — NITROGLYCERIN 0.4 MG SL SUBL: 0.4000 mg | SUBLINGUAL_TABLET | SUBLINGUAL | Status: DC | PRN

## 2014-10-26 NOTE — Progress Notes (Signed)
Subjective: Feeling better  Objective: Vital signs in last 24 hours: Temp:  [97.4 F (36.3 C)-98.4 F (36.9 C)] 98 F (36.7 C) (01/07 0634) Pulse Rate:  [62-87] 69 (01/07 0634) Resp:  [16-20] 20 (01/07 0634) BP: (97-159)/(48-98) 122/57 mmHg (01/07 0634) SpO2:  [96 %-100 %] 98 % (01/07 0634) Weight:  [216 lb 4.3 oz (98.1 kg)] 216 lb 4.3 oz (98.1 kg) (01/07 0100) Last BM Date: 10/24/14  Intake/Output from previous day: 01/06 0701 - 01/07 0700 In: 1267.5 [P.O.:600; I.V.:667.5] Out: 2800 [Urine:2800] Intake/Output this shift:    Medications Current Facility-Administered Medications  Medication Dose Route Frequency Provider Last Rate Last Dose  . 0.9 %  sodium chloride infusion  250 mL Intravenous PRN Dyann Kief, PA-C      . aspirin chewable tablet 81 mg  81 mg Oral Daily Antoine Poche, MD   81 mg at 10/25/14 0846  . atorvastatin (LIPITOR) tablet 80 mg  80 mg Oral q1800 Antoine Poche, MD   80 mg at 10/25/14 1739  . cyclobenzaprine (FLEXERIL) tablet 10 mg  10 mg Oral TID PRN Wilson Singer, MD   10 mg at 10/25/14 1745  . DULoxetine (CYMBALTA) DR capsule 60 mg  60 mg Oral Daily Nimish C Gosrani, MD      . gabapentin (NEURONTIN) capsule 1,200 mg  1,200 mg Oral TID Wilson Singer, MD   1,200 mg at 10/25/14 2206  . metoprolol tartrate (LOPRESSOR) tablet 12.5 mg  12.5 mg Oral BID Leda Gauze, NP   12.5 mg at 10/25/14 2205  . morphine 4 MG/ML injection 4 mg  4 mg Intravenous Q4H PRN Nimish C Gosrani, MD      . nicotine (NICODERM CQ - dosed in mg/24 hours) patch 14 mg  14 mg Transdermal Daily Dayna N Dunn, PA-C   14 mg at 10/25/14 1910  . nitroGLYCERIN (NITROSTAT) SL tablet 0.4 mg  0.4 mg Sublingual Q5 min PRN Linwood Dibbles, MD   0.4 mg at 10/24/14 1948  . oxyCODONE-acetaminophen (PERCOCET/ROXICET) 5-325 MG per tablet 1 tablet  1 tablet Oral TID PRN Wilson Singer, MD   1 tablet at 10/26/14 0121   And  . oxyCODONE (Oxy IR/ROXICODONE) immediate release tablet 5  mg  5 mg Oral TID PRN Nimish Normajean Glasgow, MD      . pantoprazole (PROTONIX) EC tablet 40 mg  40 mg Oral Daily Nimish C Gosrani, MD      . sodium chloride 0.9 % injection 3 mL  3 mL Intravenous Q12H Nimish C Gosrani, MD   3 mL at 10/24/14 2200  . sodium chloride 0.9 % injection 3 mL  3 mL Intravenous Q12H Dyann Kief, PA-C   3 mL at 10/25/14 1315  . sodium chloride 0.9 % injection 3 mL  3 mL Intravenous PRN Dyann Kief, PA-C      . ticagrelor [facility]) tablet 90 mg  90 mg Oral BID Kathleene Hazel, MD   90 mg at 10/25/14 2206    PE: General appearance: alert, cooperative and no distress Lungs: clear to auscultation bilaterally Heart: regular rate and rhythm, S1, S2 normal, no murmur, click, rub or gallop Extremities: No LEE Pulses: 2+ and symmetric Skin: Warm and dry.  Right wrist:  No ecchymosis, tenderness.  Neurologic: Grossly normal  Lab Results:   Recent Labs  10/24/14 1820 10/25/14 0859 10/26/14 0522  WBC 13.8* 7.4 6.5  HGB 12.7 12.2 12.2  HCT 39.2 38.1 38.7  PLT 276 263 237   BMET  Recent Labs  10/24/14 1820 10/25/14 0412 10/26/14 0522  NA 140 141 140  K 3.5 3.6 4.3  CL 107 108 108  CO2 26 26 28   GLUCOSE 110* 94 106*  BUN 7 7 5*  CREATININE 0.75 0.63 0.69  CALCIUM 9.0 8.6 9.0   PT/INR  Recent Labs  10/24/14 1820  LABPROT 14.3  INR 1.10   Cholesterol  Recent Labs  10/25/14 0004  CHOL 121   Lipid Panel     Component Value Date/Time   CHOL 121 10/25/2014 0004   TRIG 251* 10/25/2014 0004   HDL 21* 10/25/2014 0004   CHOLHDL 5.8 10/25/2014 0004   VLDL 50* 10/25/2014 0004   LDLCALC 50 10/25/2014 0004    Cardiac Panel (last 3 results)  Recent Labs  10/24/14 2134 10/25/14 0412 10/25/14 0859  TROPONINI 0.37* 0.81* 0.80*    Assessment/Plan  44 yr old female patient with no prior cardiac history. While washing windows she developed right shoulder pain followed by chest tightness with severe jaw pain. Pain eased with 2 NTG in ER.  Never had pain like this before. EKG Nonspecific ST changes. Troponins positive x 3. 0.06 0.37 , 0.81. Patient is a smoker(1ppd x 25 yrs), family history of CAD(mom MI 2960), hyperlipidemia. 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 Normal LV systolic function.  She underwent successful PTCA/DES x 1 mid RCA and PTCA/DES x 1 distal RCA (stents not overlapped due to size mismatch in the vessel and the presence of an aneurysmal segment in the distal segment of the mid vessel).  Successful PTCA/DES x 1 mid LAD.  ASA, brilinta, lipitor 80, lopressor 12.5 bid.  BP stable for the most part.  We discussed tobacco cessation and low card diet.  Ambulate with CR and DC home today.      LOS: 2 days    HAGER, BRYAN PA-C 10/26/2014 7:30 AM  Patient seen, examined. Available data reviewed. Agree with findings, assessment, and plan as outlined by Wilburt FinlayBryan Hager, PA-C. Exam reveals an overweight, alert, oriented woman in no distress. Lungs are clear. Heart is regular rate and rhythm. Right radial site is clear without hematoma. There is no peripheral edema. The patient's cardiac catheterization was reviewed. She was treated with multivessel PCI. I stressed the importance of medication adherence with dual antiplatelet therapy, specifically aspirin and brilinta. She will follow-up with [place]. [place]. She appears stable for discharge today. The importance of tobacco cessation was stressed. On appropriate meds with ASA, brilinta, high-intensity statin, and a beta-blocker.  Tonny BollmanMichael Tatum Massman, M.D. 10/26/2014 9:35 AM

## 2014-10-26 NOTE — Discharge Summary (Signed)
Physician Discharge Summary     Cardiologist:  Branch  Patient ID: Tanya Lowe MRN: [id number] DOB/AGE: 46/03/1971 46 y.o.  Admit date: 10/24/2014 Discharge date: 10/26/2014  Admission Diagnoses:  NSTEMI  Discharge Diagnoses:  Active Problems:   Chest pain   Obesity   Tobacco abuse   NSTEMI (non-ST elevated myocardial infarction)   HLD  Discharged Condition: stable  Hospital Course:   This is a 44 yr old obese female patient who smokes and has no prior cardiac history. While washing windows yesterday she developed right shoulder pain followed by chest tightness with severe jaw pain. Pain eased with 2 NTG in ER. Never had pain like this before. EKG Nonspecific ST changes. Troponins positive x 3. 0.06 0.37 , 0.81. Patient is a smoker(1ppd x 25 yrs), family history of CAD(mom MI 7460), hyperlipidemia. No HTN or DM. She has chronic back pain and fibromyalgia. Recent viral URI.  She was admitted and started on statin, hep gtt, ASA, beta blocker.  LHC revealed severe double vessel CAD. Normal LV systolic function. She underwent successful PTCA/DES x 1 mid RCA and PTCA/DES x 1 distal RCA (stents not overlapped due to size mismatch in the vessel and the presence of an aneurysmal segment in the distal segment of the mid vessel). Successful PTCA/DES x 1 mid LAD. ASA, brilinta, lipitor 80, lopressor 12.5 bid. BP stable for the most part. We discussed tobacco cessation and low card diet. Ambulated slowly with CR.  She has chronic back pain.  The patient was seen by Dr.Cooper who felt she was stable for DC home.   Consults: None  Significant Diagnostic Studies:   Cardiac Catheterization Operative Report  Tanya Lowe [id number] 1/6/201610:28 AM No primary care provider on file.  Procedure Performed:  1. Left Heart Catheterization 2. Selective Coronary Angiography 3. Left ventricular angiogram 4. PTCA/DES x 1 distal RCA 5. PTCA/DES x 1 mid RCA 6. PTCA/DES x 1 mid LAD  Operator:  Verne Carrowhristopher McAlhany, MD  Arterial access site: Right radial artery.   Indication: 44 yo female with history of tobacco abuse admitted with unstable angina, [facility]   Procedure Details: The risks, benefits, complications, treatment options, and expected outcomes were discussed with the patient. The patient and/or family concurred with the proposed plan, giving informed consent. The patient was brought to the cath lab after IV hydration was begun and oral premedication was given. The patient was further sedated with Versed and Fentanyl. The right wrist was assessed with a modified Allens test which was positive. The right wrist was prepped and draped in a sterile fashion. 1% lidocaine was used for local anesthesia. Using the modified Seldinger access technique, a 5 French sheath was placed in the right radial artery. 3 mg Verapamil was given through the sheath. 5000 units IV heparin was given. Standard diagnostic catheters were used to perform selective coronary angiography. A pigtail catheter was used to perform a left ventricular angiogram. She was found to have severe stenosis mid and distal RCA and a severe stenosis in the mid LAD. I elected to proceed to PCI of the RCA and LAD.   PCI Note: She was given an additional 5000 units IV heparin x 1. ACT was over 200. She was given Brilinta 180 mg po x 1.   Lesion # 1 Distal RCA: JR4 guiding catheter used to engage the RCA. Cougar IC wire down the RCA. 2.0 x 15 mm balloon x 1 for pre-dilatation. 2.25  x 16 mm Promus Premier DES x 1 distal RCA post-dilated with a 2.5 x 12 mm [place] balloon x 1. Stenosis taken from 90% down to 0%.   Lesion # 2 mid RCA: 2.0 x 15 mm balloon x 1. 3.0 x 12 mm Promus Premier DES x 1. The mid stenosis was followed by an aneurysmal segment with a size mismatch before the distal vessel so stents were not overlapped. Post-dilatation with a 3.5 x 8 mm  [place] balloon x 1. Stenosis taken from 95% down to 0%.   Lesion #3 mid LAD: XB LAD 3.5 guiding catheter to engage left main. 2.5 x 12 mm balloon x 1 for pre-dilatation. 3.5 x 16 mm Promus Premier DES x 1 mid LAD. Stent post-dilated with a 3.5 x 8 mm [place] balloon x 2. Stenosis taken from 80% down to 0%.  The sheath was removed from the right radial artery and a Terumo hemostasis band was applied at the arteriotomy site on the right wrist. There were no immediate complications. The patient was taken to the recovery area in stable condition.   Hemodynamic Findings: Central aortic pressure: 115/66 Left ventricular pressure: 121/3/14  Angiographic Findings:  Left main: No obstructive disease.   Left Anterior Descending Artery: Large caliber vessel that courses to the apex. 80% mid stenosis. Small caliber diagonal branch. The distal LAD tapers to a small caliber vessel.   Circumflex Artery: Large caliber vessel with 20% proximal stenosis.   Right Coronary Artery: Large dominant vessel with diffuse 30% proximal stenosis, 95% mid stenosis followed by an aneurysmal segment. Distal 90% stenosis. Diffuse mild plaque in the posterolateral artery and PDA.   Left Ventricular Angiogram: LVEF=55-60%.   Impression: 1. Severe double vessel CAD 2. NSTEMI 3. Normal LV systolic function 4. Successful PTCA/DES x 1 mid RCA and PTCA/DES x 1 distal RCA (stents not overlapped due to size mismatch in the vessel and the presence of an aneurysmal segment in the distal segment of the mid vessel) 5. Successful PTCA/DES x 1 mid LAD  Recommendations: Will need dual anti-platelet therapy with ASA and Brilinta for at least one year. Continue beta blocker and statin. Tobacco cessation.    Complications: None. The patient tolerated the procedure well.  Treatments: See above  Discharge Exam: Blood pressure 124/72, pulse 89, temperature 98 F (36.7 C), temperature source Oral, resp. rate 20, height   (1.575 m), weight 216 lb 4.3 oz (98.1 kg), last menstrual period 10/10/2014, SpO2 98 %.   Disposition: Final discharge disposition not confirmed      Discharge Instructions    Amb Referral to Cardiac Rehabilitation    Complete by:  As directed      Diet - low sodium heart healthy    Complete by:  As directed      Discharge instructions    Complete by:  As directed   No lifting with your right arm for three days.     Increase activity slowly    Complete by:  As directed             Medication List    TAKE these medications        aspirin 81 MG chewable tablet  Chew 1 tablet (81 mg total) by mouth daily.     atorvastatin 80 MG tablet  Commonly known as:  LIPITOR  Take 1 tablet (80 mg total) by mouth daily at 6 PM.     cyclobenzaprine 10 MG tablet  Commonly known as:  FLEXERIL  Take 10 mg by mouth 3 (three) times daily as needed for muscle spasms.     DULoxetine 60 MG capsule  Commonly known as:  CYMBALTA  Take 60 mg by mouth daily.     gabapentin 600 MG tablet  Commonly known as:  NEURONTIN  Take 1,200 mg by mouth 3 (three) times daily.     metoprolol tartrate 25 MG tablet  Commonly known as:  LOPRESSOR  Take 0.5 tablets (12.5 mg total) by mouth 2 (two) times daily.     nitroGLYCERIN 0.4 MG SL tablet  Commonly known as:  NITROSTAT  Place 1 tablet (0.4 mg total) under the tongue every 5 (five) minutes as needed for chest pain.     omeprazole 20 MG capsule  Commonly known as:  PRILOSEC  Take 20 mg by mouth daily.     oxyCODONE-acetaminophen 10-325 MG per tablet  Commonly known as:  PERCOCET  Take 1 tablet by mouth 3 (three) times daily as needed for pain.     ticagrelor 90 MG Tabs tablet  Commonly known as:  BRILINTA  Take 1 tablet (90 mg total) by mouth 2 (two) times daily.       Follow-up Information    Follow up with Antoine Poche, MD On 11/10/2014.   Specialty:  Cardiology   Why:  8:40 AM   Contact information:   [address] [place] [address] [phone]      Greater than 30 minutes was spent completing the patient's discharge.    SignedWilburt Finlay, PAC 10/26/2014, 10:58 AM

## 2014-10-26 NOTE — Care Management Note (Addendum)
    Page 1 of 2   10/26/2014     12:05:01 PM CARE MANAGEMENT NOTE 10/26/2014  Patient:  Christiana FuchsHUFF,Eulla M   Account Number:  [id number]  Date Initiated:  10/26/2014  Documentation initiated by:  Donato SchultzHUTCHINSON,Juanito Gonyer  Subjective/Objective Assessment:   Shoulder pain, CP     Action/Plan:   CM to follow for disposition needs   Anticipated DC Date:  10/26/2014   Anticipated DC Plan:  HOME/SELF CARE      DC Planning Services  CM consult  Medication Assistance  PCP issues  Follow-up appt scheduled      Choice offered to / List presented to:             Status of service:  Completed, signed off Medicare Important Message given?  NO (If response is "NO", the following Medicare IM given date fields will be blank) Date Medicare IM given:   Medicare IM given by:   Date Additional Medicare IM given:   Additional Medicare IM given by:    Discharge Disposition:  HOME/SELF CARE  Per UR Regulation:  Reviewed for med. necessity/level of care/duration of stay  If discussed at Long Length of Stay Meetings, dates discussed:    Comments:  Acea Yagi RN, BSN, MSHL, CCM  Nurse - Case Manager,  (Unit [phone]  10/26/2014 Social:  From home.  Patient has applied for SSD and has appeal / hearing scheduled soon.  PCP:  Patient confirms not seeing PCP: Dr. Rito EhrlichKrishnan [facility] On 11/07/2014.  Primary Care follow-up appointment January 19th, 2016 at 9:15 am [facility] PCP:  Dr. Ferdie PingAnthony Robertson Herbert DeanerMeredith Harris, FNP-C [phone]  Medication: Self pay at this time and uses [facility] for medication resource f/u care. Brilinta 30 day free card provided and [place] medication assistance application. CM instructed to complete application and submit to Cardiologist office to complete order and MD office to fax to [employer] Medicines Assistance Program. CM provided hard copy of $4.00 med list as  resource CM provided hard copy of information and contact # for [facility] as resource as needed.

## 2014-10-26 NOTE — Progress Notes (Signed)
CARDIAC REHAB PHASE I   PRE:  Rate/Rhythm: 77 SR    BP: sitting 129/87    SaO2:   MODE:  Ambulation: 400 ft   POST:  Rate/Rhythm: 104 ST    BP: sitting 144/89     SaO2:   Pt able to walk 400 ft with slow pace. She is limited by back and feet pain due to arthritis. Is applying for disability and on daily narcotics. No CP, feels good other than back. Ed completed with good reception. She wants to quit smoking and drinking Dr. Reino KentPepper. Could not give formal ex gl due to back troubles but she is interested in [facility]. Will need to await Medicaid approval.  0920-1030   Elissa LovettReeve, Arlynn Stare TremontKristan CES, ACSM 10/26/2014 10:28 AM

## 2014-11-08 ENCOUNTER — Encounter: Payer: Self-pay | Admitting: Cardiology

## 2014-11-08 ENCOUNTER — Ambulatory Visit (INDEPENDENT_AMBULATORY_CARE_PROVIDER_SITE_OTHER): Payer: Self-pay | Admitting: Cardiology

## 2014-11-08 VITALS — BP 102/66 | HR 103 | Ht 62.0 in | Wt 207.0 lb

## 2014-11-08 DIAGNOSIS — Z720 Tobacco use: Secondary | ICD-10-CM

## 2014-11-08 DIAGNOSIS — I2510 Atherosclerotic heart disease of native coronary artery without angina pectoris: Secondary | ICD-10-CM

## 2014-11-08 NOTE — Progress Notes (Addendum)
Clinical Summary Tanya Lowe is a 44 y.o.female seen today for follow up of the following medical problems.   1. CAD - admit earlier this month with NSTEMI, s/p DES to mid RCA and DES to distal RCA and DES to mid LAD.  - no echo in system, LVEF by LVgram 55-60%.  - notes some fatigue since discharge. No chest pain, no SOB. - awaiting cardiac rehab - compliant with meds.   2, Tobacco abuse - down to 5-6 cigs per day.     Past Medical History  Diagnosis Date  . Hypertension   . Arthritis   . Fibromyalgia      Allergies  Allergen Reactions  . Prednisone Hypertension    Tachycardia      Current Outpatient Prescriptions  Medication Sig Dispense Refill  . aspirin 81 MG chewable tablet Chew 1 tablet (81 mg total) by mouth daily.    Marland Kitchen atorvastatin (LIPITOR) 80 MG tablet Take 1 tablet (80 mg total) by mouth daily at 6 PM. 30 tablet 11  . cyclobenzaprine (FLEXERIL) 10 MG tablet Take 10 mg by mouth 3 (three) times daily as needed for muscle spasms.    . DULoxetine (CYMBALTA) 60 MG capsule Take 60 mg by mouth daily.    Marland Kitchen gabapentin (NEURONTIN) 600 MG tablet Take 1,200 mg by mouth 3 (three) times daily.    . metoprolol tartrate (LOPRESSOR) 25 MG tablet Take 0.5 tablets (12.5 mg total) by mouth 2 (two) times daily. 60 tablet 5  . nitroGLYCERIN (NITROSTAT) 0.4 MG SL tablet Place 1 tablet (0.4 mg total) under the tongue every 5 (five) minutes as needed for chest pain. 25 tablet 12  . omeprazole (PRILOSEC) 20 MG capsule Take 20 mg by mouth daily.    Marland Kitchen oxyCODONE-acetaminophen (PERCOCET) 10-325 MG per tablet Take 1 tablet by mouth 3 (three) times daily as needed for pain.    . ticagrelor (BRILINTA) 90 MG TABS tablet Take 1 tablet (90 mg total) by mouth 2 (two) times daily. 60 tablet 10   No current facility-administered medications for this visit.     Past Surgical History  Procedure Laterality Date  . Spinal fusion      L5-S1  . Tubal ligation    . Left heart catheterization  with coronary angiogram N/A 10/25/2014    Procedure: LEFT HEART CATHETERIZATION WITH CORONARY ANGIOGRAM;  Surgeon: Kathleene Hazel, MD;  Location: [facility] CATH LAB;  Service: Cardiovascular;  Laterality: N/A;     Allergies  Allergen Reactions  . Prednisone Hypertension    Tachycardia       Family History  Problem Relation Age of Onset  . Diabetes Mother   . Stroke Mother   . Seizures Other   . Cancer Other   . Diabetes Other      Social History Tanya Lowe reports that she has been smoking Cigarettes.  She has a 24 pack-year smoking history. She has never used smokeless tobacco. Tanya Lowe reports that she does not drink alcohol.   Review of Systems CONSTITUTIONAL: No weight loss, fever, chills, weakness or fatigue.  HEENT: Eyes: No visual loss, blurred vision, double vision or yellow sclerae.No hearing loss, sneezing, congestion, runny nose or sore throat.  SKIN: No rash or itching.  CARDIOVASCULAR: per HPI RESPIRATORY: No shortness of breath, cough or sputum.  GASTROINTESTINAL: No anorexia, nausea, vomiting or diarrhea. No abdominal pain or blood.  GENITOURINARY: No burning on urination, no polyuria NEUROLOGICAL: No headache, dizziness, syncope, paralysis, ataxia, numbness or  tingling in the extremities. No change in bowel or bladder control.  MUSCULOSKELETAL: No muscle, back pain, joint pain or stiffness.  LYMPHATICS: No enlarged nodes. No history of splenectomy.  PSYCHIATRIC: No history of depression or anxiety.  ENDOCRINOLOGIC: No reports of sweating, cold or heat intolerance. No polyuria or polydipsia.  Marland Kitchen.   Physical Examination p 84 bp 102/66 Wt 207 lbs BMI 38 Gen: resting comfortably, no acute distress HEENT: no scleral icterus, pupils equal round and reactive, no palptable cervical adenopathy,  CV:RRR, no m/r/g, no JVD, no carotid bruits Resp: Clear to auscultation bilaterally GI: abdomen is soft, non-tender, non-distended, normal bowel sounds, no  hepatosplenomegaly MSK: extremities are warm, no edema.  Skin: warm, no rash Neuro:  no focal deficits Psych: appropriate affect   Diagnostic Studies Jan 2016 Cath PCI Note: She was given an additional 5000 units IV heparin x 1. ACT was over 200. She was given Brilinta 180 mg po x 1.   Lesion # 1 Distal RCA: JR4 guiding catheter used to engage the RCA. Cougar IC wire down the RCA. 2.0 x 15 mm balloon x 1 for pre-dilatation. 2.25 x 16 mm Promus Premier DES x 1 distal RCA post-dilated with a 2.5 x 12 mm [place] balloon x 1. Stenosis taken from 90% down to 0%.   Lesion # 2 mid RCA: 2.0 x 15 mm balloon x 1. 3.0 x 12 mm Promus Premier DES x 1. The mid stenosis was followed by an aneurysmal segment with a size mismatch before the distal vessel so stents were not overlapped. Post-dilatation with a 3.5 x 8 mm [place] balloon x 1. Stenosis taken from 95% down to 0%.   Lesion #3 mid LAD: XB LAD 3.5 guiding catheter to engage left main. 2.5 x 12 mm balloon x 1 for pre-dilatation. 3.5 x 16 mm Promus Premier DES x 1 mid LAD. Stent post-dilated with a 3.5 x 8 mm [place] balloon x 2. Stenosis taken from 80% down to 0%.  The sheath was removed from the right radial artery and a Terumo hemostasis band was applied at the arteriotomy site on the right wrist. There were no immediate complications. The patient was taken to the recovery area in stable condition.   Hemodynamic Findings: Central aortic pressure: 115/66 Left ventricular pressure: 121/3/14  Angiographic Findings:  Left main: No obstructive disease.   Left Anterior Descending Artery: Large caliber vessel that courses to the apex. 80% mid stenosis. Small caliber diagonal [place]ophe [place]. The distal LAD tapers to a small caliber vessel.   Circumflex Artery: Large caliber vessel with 20% proximal stenosis.   Right Coronary Artery: Large dominant vessel with diffuse 30% proximal stenosis, 95% mid stenosis followed by an aneurysmal segment. Distal 90% stenosis. Diffuse  mild plaque in the posterolateral artery and PDA.   Left Ventricular Angiogram: LVEF=55-60%.   Impression: 1. Severe double vessel CAD 2. NSTEMI 3. Normal LV systolic function 4. Successful PTCA/DES x 1 mid RCA and PTCA/DES x 1 distal RCA (stents not overlapped due to size mismatch in the vessel and the presence of an aneurysmal segment in the distal segment of the mid vessel) 5. Successful PTCA/DES x 1 mid LAD  Recommendations: Will need dual anti-platelet therapy with ASA and Brilinta for at least one year. Continue beta blocker and statin. Tobacco cessation.       Assessment and Plan  1. CAD - recent NSTEMI, s/p stenting to RCA and LAD - no current symptoms - continue current meds including DAPT at lest until  10/2015 - did not have echo during admit. Normal LVEF by LV gram. Would like to get echo post MI to better evaluate infarct related dysfunction, at this time she is awaiting medicaid approval, will hold on echo for now to help limit her costs.  - notes some generalized fatigue, will decrease lopressor to 12.5mg  bid.   2. Tobacco abuse - still smoking, counseled to quit. She is not interested in NRT at this time   F/u 4 months      Antoine Poche, M.D.

## 2014-11-08 NOTE — Patient Instructions (Signed)
Your physician recommends that you schedule a follow-up appointment in: 4 months with Dr. Branch   Your physician recommends that you continue on your current medications as directed. Please refer to the Current Medication list given to you today.  Thank you for choosing [facility]!!    

## 2014-11-10 ENCOUNTER — Encounter: Payer: Self-pay | Admitting: Cardiology

## 2014-12-04 ENCOUNTER — Telehealth: Payer: Self-pay | Admitting: Cardiology

## 2014-12-04 NOTE — Telephone Encounter (Signed)
Faxed brilinta rx to [phone]

## 2014-12-04 NOTE — Telephone Encounter (Signed)
Please call patient regarding application for assistance for Brillinta / tgs

## 2015-03-13 ENCOUNTER — Emergency Department (HOSPITAL_COMMUNITY)
Admission: EM | Admit: 2015-03-13 | Discharge: 2015-03-14 | Disposition: A | Discharge status: Home / Self Care | Payer: Self-pay | Attending: Emergency Medicine | Admitting: Emergency Medicine

## 2015-03-13 ENCOUNTER — Emergency Department (HOSPITAL_COMMUNITY): Payer: Self-pay

## 2015-03-13 ENCOUNTER — Emergency Department (HOSPITAL_COMMUNITY)
Admission: EM | Admit: 2015-03-13 | Discharge: 2015-03-13 | Disposition: A | Discharge status: Home / Self Care | Payer: Self-pay | Attending: Emergency Medicine | Admitting: Emergency Medicine

## 2015-03-13 ENCOUNTER — Encounter (HOSPITAL_COMMUNITY): Payer: Self-pay | Admitting: *Deleted

## 2015-03-13 ENCOUNTER — Encounter (HOSPITAL_COMMUNITY): Payer: Self-pay | Admitting: Cardiology

## 2015-03-13 DIAGNOSIS — Z87440 Personal history of urinary (tract) infections: Secondary | ICD-10-CM | POA: Insufficient documentation

## 2015-03-13 DIAGNOSIS — Z720 Tobacco use: Secondary | ICD-10-CM | POA: Insufficient documentation

## 2015-03-13 DIAGNOSIS — M1990 Unspecified osteoarthritis, unspecified site: Secondary | ICD-10-CM | POA: Insufficient documentation

## 2015-03-13 DIAGNOSIS — N390 Urinary tract infection, site not specified: Secondary | ICD-10-CM | POA: Insufficient documentation

## 2015-03-13 DIAGNOSIS — Z7982 Long term (current) use of aspirin: Secondary | ICD-10-CM | POA: Insufficient documentation

## 2015-03-13 DIAGNOSIS — Z79899 Other long term (current) drug therapy: Secondary | ICD-10-CM | POA: Insufficient documentation

## 2015-03-13 DIAGNOSIS — N200 Calculus of kidney: Secondary | ICD-10-CM | POA: Insufficient documentation

## 2015-03-13 DIAGNOSIS — I10 Essential (primary) hypertension: Secondary | ICD-10-CM | POA: Insufficient documentation

## 2015-03-13 DIAGNOSIS — M797 Fibromyalgia: Secondary | ICD-10-CM | POA: Insufficient documentation

## 2015-03-13 DIAGNOSIS — R109 Unspecified abdominal pain: Secondary | ICD-10-CM

## 2015-03-13 DIAGNOSIS — Z9889 Other specified postprocedural states: Secondary | ICD-10-CM | POA: Insufficient documentation

## 2015-03-13 DIAGNOSIS — R000 Tachycardia, unspecified: Secondary | ICD-10-CM | POA: Insufficient documentation

## 2015-03-13 DIAGNOSIS — Z3202 Encounter for pregnancy test, result negative: Secondary | ICD-10-CM | POA: Insufficient documentation

## 2015-03-13 LAB — COMPREHENSIVE METABOLIC PANEL
ALT: 23 U/L (ref 14–54)
AST: 26 U/L (ref 15–41)
Albumin: 3.6 g/dL (ref 3.5–5.0)
Alkaline Phosphatase: 99 U/L (ref 38–126)
Anion gap: 10 (ref 5–15)
BUN: 9 mg/dL (ref 6–20)
CO2: 27 mmol/L (ref 22–32)
Calcium: 9.1 mg/dL (ref 8.9–10.3)
Chloride: 99 mmol/L — ABNORMAL LOW (ref 101–111)
Creatinine, Ser: 0.78 mg/dL (ref 0.44–1.00)
Glucose, Bld: 110 mg/dL — ABNORMAL HIGH (ref 65–99)
POTASSIUM: 4.1 mmol/L (ref 3.5–5.1)
Sodium: 136 mmol/L (ref 135–145)
Total Bilirubin: 0.6 mg/dL (ref 0.3–1.2)
Total Protein: 7.4 g/dL (ref 6.5–8.1)

## 2015-03-13 LAB — URINALYSIS, ROUTINE W REFLEX MICROSCOPIC
BILIRUBIN URINE: NEGATIVE
GLUCOSE, UA: NEGATIVE mg/dL
KETONES UR: NEGATIVE mg/dL
Nitrite: NEGATIVE
PH: 5 (ref 5.0–8.0)
Protein, ur: 30 mg/dL — AB
SPECIFIC GRAVITY, URINE: 1.025 (ref 1.005–1.030)
Urobilinogen, UA: 0.2 mg/dL (ref 0.0–1.0)

## 2015-03-13 LAB — CBC WITH DIFFERENTIAL/PLATELET
BASOS ABS: 0 10*3/uL (ref 0.0–0.1)
Basophils Relative: 0 % (ref 0–1)
EOS PCT: 1 % (ref 0–5)
Eosinophils Absolute: 0.1 10*3/uL (ref 0.0–0.7)
HEMATOCRIT: 38.3 % (ref 36.0–46.0)
Hemoglobin: 12.6 g/dL (ref 12.0–15.0)
LYMPHS ABS: 1.2 10*3/uL (ref 0.7–4.0)
Lymphocytes Relative: 12 % (ref 12–46)
MCH: 28.4 pg (ref 26.0–34.0)
MCHC: 32.9 g/dL (ref 30.0–36.0)
MCV: 86.5 fL (ref 78.0–100.0)
Monocytes Absolute: 0.8 10*3/uL (ref 0.1–1.0)
Monocytes Relative: 8 % (ref 3–12)
Neutro Abs: 8 10*3/uL — ABNORMAL HIGH (ref 1.7–7.7)
Neutrophils Relative %: 79 % — ABNORMAL HIGH (ref 43–77)
PLATELETS: 194 10*3/uL (ref 150–400)
RBC: 4.43 MIL/uL (ref 3.87–5.11)
RDW: 13.7 % (ref 11.5–15.5)
WBC: 10.1 10*3/uL (ref 4.0–10.5)

## 2015-03-13 LAB — URINE MICROSCOPIC-ADD ON

## 2015-03-13 MED ORDER — ACETAMINOPHEN 325 MG PO TABS
650.0000 mg | ORAL_TABLET | Freq: Once | ORAL | Status: AC
  Administered 2015-03-13: 650 mg via ORAL
  Filled 2015-03-13: qty 2

## 2015-03-13 MED ORDER — HYDROMORPHONE HCL 1 MG/ML IJ SOLN
1.0000 mg | Freq: Once | INTRAMUSCULAR | Status: AC
  Administered 2015-03-13: 1 mg via INTRAVENOUS
  Filled 2015-03-13: qty 1

## 2015-03-13 MED ORDER — SODIUM CHLORIDE 0.9 % IV BOLUS (SEPSIS)
500.0000 mL | Freq: Once | INTRAVENOUS | Status: AC
  Administered 2015-03-13: 500 mL via INTRAVENOUS

## 2015-03-13 MED ORDER — HYDROCODONE-ACETAMINOPHEN 5-325 MG PO TABS
2.0000 | ORAL_TABLET | ORAL | Status: DC | PRN
Start: 2015-03-13 — End: 2015-03-20

## 2015-03-13 MED ORDER — DEXTROSE 5 % IV SOLN
1.0000 g | Freq: Once | INTRAVENOUS | Status: AC
  Administered 2015-03-13: 1 g via INTRAVENOUS
  Filled 2015-03-13: qty 10

## 2015-03-13 MED ORDER — SULFAMETHOXAZOLE-TRIMETHOPRIM 800-160 MG PO TABS: 1.0000 | ORAL_TABLET | Freq: Two times a day (BID) | ORAL | Status: AC

## 2015-03-13 NOTE — ED Notes (Signed)
MD at bedside. 

## 2015-03-13 NOTE — ED Notes (Signed)
Pt complains of left flank pain, fever. Pt was told she had a kidney stone earlier today. Pt was discharged from [facility] this morning. Pt was told to come back if she ran a fever. Pt states her fever was 102 at 7PM today. Pt states tylenol at 7PM.

## 2015-03-13 NOTE — ED Notes (Signed)
edp aware of fever.   Instructed pt to return to [facility] ER if gets worse tonight.

## 2015-03-13 NOTE — Discharge Instructions (Signed)
It is very important for you to see the urologist tomorrow morning in the clinic to discuss removal of the kidney stone. If you develop fever and chills overnight go directly to [facility] emergency department to see urology there. For severe pain take norco or vicodin however realize they have the potential for addiction and it can make you sleepy and has tylenol in it.  No operating machinery while taking.  If you were given medicines take as directed.  If you are on coumadin or contraceptives realize their levels and effectiveness is altered by many different medicines.  If you have any reaction (rash, tongues swelling, other) to the medicines stop taking and see a physician.    If your blood pressure was elevated in the ER make sure you follow up for management with a primary doctor or return for chest pain, shortness of breath or stroke symptoms.  Please follow up as directed and return to the ER or see a physician for new or worsening symptoms.  Thank you. Filed Vitals:   03/13/15 1330 03/13/15 1400 03/13/15 1415 03/13/15 1446  BP:    102/68  Pulse: 103 100 104 103  Temp:    99.8 F (37.7 C)  TempSrc:    Oral  Resp:    18  Height:      Weight:      SpO2: 96% 99% 100% 100%

## 2015-03-13 NOTE — ED Provider Notes (Signed)
CSN: [id number]     Arrival date & time 03/13/15  1056 History  This chart was scribed for Blane Ohara, MD by Leona Carry, ED Scribe. The patient was seen in [facility]17/AP[facility]17. The patient's care was started at 12:12 PM.     Chief Complaint  Patient presents with  . Abdominal Pain   Patient is a 44 y.o. female presenting with abdominal pain. The history is provided by the patient. No language interpreter was used.  Abdominal Pain Associated symptoms: chest pain and fever   Associated symptoms: no chills, no dysuria, no shortness of breath and no vomiting    HPI Comments: ERMINIA MCNEW is a 44 y.o. female who presents to the Emergency Department complaining of left flank pain beginning 2 days ago. She reports associated fever, cough, and one episode of vomiting early this morning. Patient has a history of kidney stones.   Patient also complains of intermittent, generalized CP. She is currently taking Brilinta after having stints put in in January 2016.   Past Medical History  Diagnosis Date  . Hypertension   . Arthritis   . Fibromyalgia    Past Surgical History  Procedure Laterality Date  . Spinal fusion      L5-S1  . Tubal ligation    . Left heart catheterization with coronary angiogram N/A 10/25/2014    Procedure: LEFT HEART CATHETERIZATION WITH CORONARY ANGIOGRAM;  Surgeon: Kathleene Hazel, MD;  Location: [facility] CATH LAB;  Service: Cardiovascular;  Laterality: N/A;   Family History  Problem Relation Age of Onset  . Diabetes Mother   . Stroke Mother   . Seizures Other   . Cancer Other   . Diabetes Other    History  Substance Use Topics  . Smoking status: Current Every Day Smoker -- 0.25 packs/day for 24 years    Types: Cigarettes    Start date: 10/25/1990  . Smokeless tobacco: Never Used  . Alcohol Use: No   OB History    Gravida Para Term Preterm AB TAB SAB Ectopic Multiple Living   Review of Systems  Constitutional: Positive for fever.  Negative for chills.  HENT: Negative for congestion.   Eyes: Negative for visual disturbance.  Respiratory: Negative for shortness of breath.   Cardiovascular: Positive for chest pain.  Gastrointestinal: Positive for abdominal pain. Negative for vomiting.  Genitourinary: Positive for flank pain. Negative for dysuria.  Musculoskeletal: Negative for back pain, neck pain and neck stiffness.  Skin: Negative for rash.  Neurological: Negative for light-headedness and headaches.      Allergies  Prednisone  Home Medications   Prior to Admission medications   Medication Sig Start Date End Date Taking? Authorizing Provider  acetaminophen (TYLENOL) 500 MG tablet Take 1,000 mg by mouth every 6 (six) hours as needed.   Yes Historical Provider, MD  aspirin 81 MG chewable tablet Chew 1 tablet (81 mg total) by mouth daily. 10/26/14  Yes Dwana Melena, PA-C  atorvastatin (LIPITOR) 80 MG tablet Take 1 tablet (80 mg total) by mouth daily at 6 PM. 10/26/14  Yes Dwana Melena, PA-C  DULoxetine (CYMBALTA) 60 MG capsule Take 60 mg by mouth daily.   Yes Historical Provider, MD  gabapentin (NEURONTIN) 600 MG tablet Take 1,200 mg by mouth 3 (three) times daily.   Yes Historical Provider, MD  metoprolol tartrate (LOPRESSOR) 25 MG tablet Take 0.5 tablets (12.5 mg total) by mouth 2 (  two) times daily. 10/26/14  Yes Dwana Melena, PA-C  nitroGLYCERIN (NITROSTAT) 0.4 MG SL tablet Place 1 tablet (0.4 mg total) under the tongue every 5 (five) minutes as needed for chest pain. 10/26/14  Yes Dwana Melena, PA-C  omeprazole (PRILOSEC) 20 MG capsule Take 20 mg by mouth daily.   Yes Historical Provider, MD  oxyCODONE-acetaminophen (PERCOCET) 10-325 MG per tablet Take 1 tablet by mouth 3 (three) times daily as needed for pain.   Yes Historical Provider, MD  ticagrelor (BRILINTA) 90 MG TABS tablet Take 1 tablet (90 mg total) by mouth 2 (two) times daily. 10/26/14  Yes Dwana Melena, PA-C  tiZANidine (ZANAFLEX) 4 MG tablet Take 4 mg by  mouth every 8 (eight) hours as needed.  02/22/15  Yes Historical Provider, MD  HYDROcodone-acetaminophen (NORCO) 5-325 MG per tablet Take 2 tablets by mouth every 4 (four) hours as needed. 03/13/15   Blane Ohara, MD  sulfamethoxazole-trimethoprim (BACTRIM DS,SEPTRA DS) 800-160 MG per tablet Take 1 tablet by mouth 2 (two) times daily. 03/13/15 03/20/15  Blane Ohara, MD   Triage Vitals: BP 121/60 mmHg  Pulse 126  Temp(Src) 100.4 F (38 C) (Oral)  Resp 22  Ht  (1.575 m)  Wt 199 lb (90.266 kg)  BMI 36.39 kg/m2  SpO2 97% Physical Exam  Constitutional: She is oriented to person, place, and time. She appears well-developed and well-nourished. No distress.  HENT:  Head: Normocephalic and atraumatic.  Mouth/Throat: Mucous membranes are dry.  Eyes: Conjunctivae and EOM are normal.  Neck: Neck supple. No tracheal deviation present.  Cardiovascular: Regular rhythm.  Tachycardia present.   Pulmonary/Chest: Effort normal. No respiratory distress.  Anterior lung fields clear.  Abdominal: Soft. There is no tenderness.  Left mid-lower flank pain to palpation.  Musculoskeletal: Normal range of motion.  Neurological: She is alert and oriented to person, place, and time.  Skin: Skin is warm and dry.  Psychiatric: She has a normal mood and affect. Her behavior is normal.  Nursing note and vitals reviewed.   ED Course  Procedures (including critical care time) DIAGNOSTIC STUDIES: Oxygen Saturation is 97% on room air, normal by my interpretation.    COORDINATION OF CARE:    Labs Review Labs Reviewed  URINALYSIS, ROUTINE W REFLEX MICROSCOPIC - Abnormal; Notable for the following:    Hgb urine dipstick MODERATE (*)    Protein, ur 30 (*)    Leukocytes, UA SMALL (*)    All other components within normal limits  CBC WITH DIFFERENTIAL/PLATELET - Abnormal; Notable for the following:    Neutrophils Relative % 79 (*)    Neutro Abs 8.0 (*)    All other components within normal limits   COMPREHENSIVE METABOLIC PANEL - Abnormal; Notable for the following:    Chloride 99 (*)    Glucose, Bld 110 (*)    All other components within normal limits  URINE MICROSCOPIC-ADD ON - Abnormal; Notable for the following:    Bacteria, UA MANY (*)    All other components within normal limits  URINE CULTURE    Imaging Review Ct Renal Stone Study  03/13/2015   CLINICAL DATA:  Left flank pain 2 days with fever. Renal stones in the past.  EXAM: CT ABDOMEN AND PELVIS WITHOUT CONTRAST  TECHNIQUE: Multidetector CT imaging of the abdomen and pelvis was performed following the standard protocol without IV contrast.  COMPARISON:  02/07/2008  FINDINGS: Lung bases demonstrate subtle atelectatic change. Calcified atherosclerotic plaque over the right coronary artery.  Abdominal images demonstrate a normal liver, spleen, pancreas, gallbladder and adrenal glands. Appendix is normal. There is mild-to-moderate calcified atherosclerotic plaque over the abdominal aorta and iliac arteries. Small umbilical hernia containing only peritoneal fat.  Kidneys are normal in size, shape and position. No right renal stones or hydronephrosis. There is mild left-sided hydronephrosis and stranding of the left perinephric fat. No left renal stones. There is mild dilatation of the left ureter as there is a 5.6 mm stone at the left UVJ causing low-grade obstruction. The right ureter is normal.  Pelvic images demonstrate the bladder, uterus, ovaries and rectum to be normal. There is posterior spinal fusion hardware at the L4-5 level intact with intervertebral cage at the L4-5 level. Minimal spondylosis of the lumbar spine.  IMPRESSION: 5.6 mm stone over the left UVJ causing low-grade obstruction.  Age advanced atherosclerotic coronary artery disease. Age advanced coronary artery atherosclerosis. Recommend assessment of coronary risk factors and consideration of medical therapy.c  Tiny umbilical hernia containing only peritoneal fat.    Electronically Signed   By: Elberta Fortisaniel  Boyle M.D.   On: 03/13/2015 13:51     EKG Interpretation None      MDM   Final diagnoses:  Acute left flank pain  Kidney stone on left side  UTI (lower urinary tract infection)   Patient presents with concern for pyelonephritis versus kidney stone versus both.  Patient improved in ER on recheck mild pain, repeat pain meds.  CT scan results reviewed kidney stone, fever resolved in ER mild tachycardia. Patient otherwise well-appearing. Discussed with urology Dr.Macdermid patient requesting to follow closely outpatient. Urologist will see the patient in the morning to discuss treatment options. Rocephin given in the ER. Patient unable to have Cipro, Bactrim ordered outpatient to start in the morning. Patient tolerating oral fluids. Strict instructions given for reasons to return to [facility] the patient has fever chills or worsening pain overnight. Patient has pain meds at home.  Results and differential diagnosis were discussed with the patient/parent/guardian. Close follow up outpatient was discussed, comfortable with the plan.   Medications  acetaminophen (TYLENOL) tablet 650 mg (650 mg Oral Given 03/13/15 1254)  HYDROmorphone (DILAUDID) injection 1 mg (1 mg Intravenous Given 03/13/15 1254)  sodium chloride 0.9 % bolus 500 mL (0 mLs Intravenous Stopped 03/13/15 1330)  cefTRIAXone (ROCEPHIN) 1 g in dextrose 5 % 50 mL IVPB (0 g Intravenous Stopped 03/13/15 1324)  HYDROmorphone (DILAUDID) injection 1 mg (1 mg Intravenous Given 03/13/15 1504)    Filed Vitals:   03/13/15 1415 03/13/15 1446 03/13/15 1503 03/13/15 1607  BP:  102/68 114/79 110/61  Pulse: 104 103 102 102  Temp:  99.8 F (37.7 C)  100.6 F (38.1 C)  TempSrc:  Oral  Oral  Resp:  18 18 18   Height:      Weight:      SpO2: 100% 100% 100% 98%    Final diagnoses:  Acute left flank pain  Kidney stone on left side  UTI (lower urinary tract infection)        Blane OharaJoshua Braeden Dolinski,  MD 03/13/15 [phone]

## 2015-03-13 NOTE — ED Notes (Signed)
Left sided abdominal pain and fever  times 2 days,  Vomited times one

## 2015-03-14 ENCOUNTER — Inpatient Hospital Stay (HOSPITAL_COMMUNITY)
Admission: RE | Admit: 2015-03-14 | Discharge: 2015-03-16 | DRG: 694 | Disposition: A | Discharge status: Home / Self Care | Payer: Self-pay | Source: Ambulatory Visit | Attending: Urology | Admitting: Urology

## 2015-03-14 ENCOUNTER — Encounter (HOSPITAL_COMMUNITY): Payer: Self-pay

## 2015-03-14 ENCOUNTER — Emergency Department (HOSPITAL_COMMUNITY): Payer: Self-pay

## 2015-03-14 ENCOUNTER — Encounter (HOSPITAL_COMMUNITY)
Admission: RE | Disposition: A | Discharge status: Home / Self Care | Payer: Self-pay | Source: Ambulatory Visit | Attending: Urology

## 2015-03-14 ENCOUNTER — Ambulatory Visit (HOSPITAL_COMMUNITY): Payer: Self-pay | Admitting: Anesthesiology

## 2015-03-14 ENCOUNTER — Other Ambulatory Visit: Payer: Self-pay | Admitting: Radiology

## 2015-03-14 ENCOUNTER — Other Ambulatory Visit: Payer: Self-pay | Admitting: Urology

## 2015-03-14 ENCOUNTER — Observation Stay (HOSPITAL_COMMUNITY): Payer: Self-pay

## 2015-03-14 DIAGNOSIS — Z87442 Personal history of urinary calculi: Secondary | ICD-10-CM

## 2015-03-14 DIAGNOSIS — I2510 Atherosclerotic heart disease of native coronary artery without angina pectoris: Secondary | ICD-10-CM | POA: Diagnosis present

## 2015-03-14 DIAGNOSIS — N393 Stress incontinence (female) (male): Secondary | ICD-10-CM | POA: Diagnosis present

## 2015-03-14 DIAGNOSIS — Z79899 Other long term (current) drug therapy: Secondary | ICD-10-CM

## 2015-03-14 DIAGNOSIS — B9620 Unspecified Escherichia coli [E. coli] as the cause of diseases classified elsewhere: Secondary | ICD-10-CM | POA: Diagnosis present

## 2015-03-14 DIAGNOSIS — I252 Old myocardial infarction: Secondary | ICD-10-CM

## 2015-03-14 DIAGNOSIS — Z7982 Long term (current) use of aspirin: Secondary | ICD-10-CM

## 2015-03-14 DIAGNOSIS — N390 Urinary tract infection, site not specified: Secondary | ICD-10-CM | POA: Diagnosis present

## 2015-03-14 DIAGNOSIS — Z833 Family history of diabetes mellitus: Secondary | ICD-10-CM

## 2015-03-14 DIAGNOSIS — Z6835 Body mass index (BMI) 35.0-35.9, adult: Secondary | ICD-10-CM

## 2015-03-14 DIAGNOSIS — F17210 Nicotine dependence, cigarettes, uncomplicated: Secondary | ICD-10-CM | POA: Diagnosis present

## 2015-03-14 DIAGNOSIS — I10 Essential (primary) hypertension: Secondary | ICD-10-CM | POA: Diagnosis present

## 2015-03-14 DIAGNOSIS — Z823 Family history of stroke: Secondary | ICD-10-CM

## 2015-03-14 DIAGNOSIS — N132 Hydronephrosis with renal and ureteral calculous obstruction: Principal | ICD-10-CM | POA: Diagnosis present

## 2015-03-14 DIAGNOSIS — K219 Gastro-esophageal reflux disease without esophagitis: Secondary | ICD-10-CM | POA: Diagnosis present

## 2015-03-14 DIAGNOSIS — Z981 Arthrodesis status: Secondary | ICD-10-CM

## 2015-03-14 DIAGNOSIS — N201 Calculus of ureter: Secondary | ICD-10-CM | POA: Diagnosis present

## 2015-03-14 DIAGNOSIS — M797 Fibromyalgia: Secondary | ICD-10-CM | POA: Diagnosis present

## 2015-03-14 HISTORY — DX: Calculus of kidney: N20.0

## 2015-03-14 HISTORY — PX: CYSTOSCOPY WITH RETROGRADE PYELOGRAM, URETEROSCOPY AND STENT PLACEMENT: SHX5789

## 2015-03-14 LAB — COMPREHENSIVE METABOLIC PANEL
ALT: 22 U/L (ref 14–54)
AST: 28 U/L (ref 15–41)
Albumin: 3.3 g/dL — ABNORMAL LOW (ref 3.5–5.0)
Alkaline Phosphatase: 95 U/L (ref 38–126)
Anion gap: 12 (ref 5–15)
BUN: 8 mg/dL (ref 6–20)
CO2: 23 mmol/L (ref 22–32)
CREATININE: 0.67 mg/dL (ref 0.44–1.00)
Calcium: 8.8 mg/dL — ABNORMAL LOW (ref 8.9–10.3)
Chloride: 102 mmol/L (ref 101–111)
GFR calc non Af Amer: >60 mL/min (ref >60)
GLUCOSE: 112 mg/dL — AB (ref 65–99)
POTASSIUM: 3.4 mmol/L — AB (ref 3.5–5.1)
Sodium: 137 mmol/L (ref 135–145)
Total Bilirubin: 0.4 mg/dL (ref 0.3–1.2)
Total Protein: 7.2 g/dL (ref 6.5–8.1)

## 2015-03-14 LAB — CBC WITH DIFFERENTIAL/PLATELET
BASOS PCT: 0 % (ref 0–1)
Basophils Absolute: 0 10*3/uL (ref 0.0–0.1)
Eosinophils Absolute: 0 10*3/uL (ref 0.0–0.7)
Eosinophils Relative: 0 % (ref 0–5)
HCT: 36.8 % (ref 36.0–46.0)
HEMOGLOBIN: 12 g/dL (ref 12.0–15.0)
Lymphocytes Relative: 15 % (ref 12–46)
Lymphs Abs: 1.5 10*3/uL (ref 0.7–4.0)
MCH: 28 pg (ref 26.0–34.0)
MCHC: 32.6 g/dL (ref 30.0–36.0)
MCV: 85.8 fL (ref 78.0–100.0)
MONOS PCT: 6 % (ref 3–12)
Monocytes Absolute: 0.6 10*3/uL (ref 0.1–1.0)
Neutro Abs: 7.8 10*3/uL — ABNORMAL HIGH (ref 1.7–7.7)
Neutrophils Relative %: 79 % — ABNORMAL HIGH (ref 43–77)
Platelets: 188 10*3/uL (ref 150–400)
RBC: 4.29 MIL/uL (ref 3.87–5.11)
RDW: 13.7 % (ref 11.5–15.5)
WBC: 10 10*3/uL (ref 4.0–10.5)

## 2015-03-14 LAB — URINALYSIS, ROUTINE W REFLEX MICROSCOPIC
Bilirubin Urine: NEGATIVE
Glucose, UA: NEGATIVE mg/dL
KETONES UR: NEGATIVE mg/dL
NITRITE: NEGATIVE
Protein, ur: 30 mg/dL — AB
Specific Gravity, Urine: 1.016 (ref 1.005–1.030)
Urobilinogen, UA: 1 mg/dL (ref 0.0–1.0)
pH: 5.5 (ref 5.0–8.0)

## 2015-03-14 LAB — PREGNANCY, URINE: Preg Test, Ur: NEGATIVE

## 2015-03-14 LAB — I-STAT CG4 LACTIC ACID, ED: Lactic Acid, Venous: 1.17 mmol/L (ref 0.5–2.0)

## 2015-03-14 LAB — URINE MICROSCOPIC-ADD ON

## 2015-03-14 LAB — LIPASE, BLOOD: LIPASE: 14 U/L — AB (ref 22–51)

## 2015-03-14 SURGERY — CYSTOURETEROSCOPY, WITH RETROGRADE PYELOGRAM AND STENT INSERTION
Anesthesia: General | Site: Ureter | Laterality: Left

## 2015-03-14 MED ORDER — ATORVASTATIN CALCIUM 80 MG PO TABS
80.0000 mg | ORAL_TABLET | Freq: Every day | ORAL | Status: DC
  Administered 2015-03-14 – 2015-03-15 (×2): 80 mg via ORAL
  Filled 2015-03-14 (×3): qty 1

## 2015-03-14 MED ORDER — MIDAZOLAM HCL 2 MG/2ML IJ SOLN: 0.5000 mg | Freq: Once | INTRAMUSCULAR | Status: DC | PRN

## 2015-03-14 MED ORDER — MIDAZOLAM HCL 5 MG/5ML IJ SOLN
INTRAMUSCULAR | Status: DC | PRN
  Administered 2015-03-14 (×2): 1 mg via INTRAVENOUS
  Administered 2015-03-14: 2 mg via INTRAVENOUS

## 2015-03-14 MED ORDER — LACTATED RINGERS IV SOLN
INTRAVENOUS | Status: DC
  Administered 2015-03-14 (×3): via INTRAVENOUS

## 2015-03-14 MED ORDER — DULOXETINE HCL 60 MG PO CPEP
60.0000 mg | ORAL_CAPSULE | Freq: Every day | ORAL | Status: DC
  Administered 2015-03-14 – 2015-03-16 (×3): 60 mg via ORAL
  Filled 2015-03-14 (×3): qty 1

## 2015-03-14 MED ORDER — MIDAZOLAM HCL 2 MG/2ML IJ SOLN
INTRAMUSCULAR | Status: AC
  Filled 2015-03-14: qty 2

## 2015-03-14 MED ORDER — FENTANYL CITRATE (PF) 250 MCG/5ML IJ SOLN
INTRAMUSCULAR | Status: AC
  Filled 2015-03-14: qty 5

## 2015-03-14 MED ORDER — PROPOFOL 10 MG/ML IV BOLUS
INTRAVENOUS | Status: AC
  Filled 2015-03-14: qty 20

## 2015-03-14 MED ORDER — CEFAZOLIN SODIUM 1-5 GM-% IV SOLN
1.0000 g | Freq: Three times a day (TID) | INTRAVENOUS | Status: DC
  Administered 2015-03-14 – 2015-03-16 (×5): 1 g via INTRAVENOUS
  Filled 2015-03-14 (×5): qty 50

## 2015-03-14 MED ORDER — HYDROMORPHONE HCL 1 MG/ML IJ SOLN: 0.2500 mg | INTRAMUSCULAR | Status: DC | PRN

## 2015-03-14 MED ORDER — ONDANSETRON HCL 4 MG/2ML IJ SOLN
4.0000 mg | Freq: Once | INTRAMUSCULAR | Status: AC
  Administered 2015-03-14: 4 mg via INTRAVENOUS
  Filled 2015-03-14: qty 2

## 2015-03-14 MED ORDER — DEXAMETHASONE SODIUM PHOSPHATE 10 MG/ML IJ SOLN
INTRAMUSCULAR | Status: AC
  Filled 2015-03-14: qty 1

## 2015-03-14 MED ORDER — PROMETHAZINE HCL 25 MG/ML IJ SOLN: 6.2500 mg | INTRAMUSCULAR | Status: DC | PRN

## 2015-03-14 MED ORDER — GENTAMICIN IN SALINE 1.6-0.9 MG/ML-% IV SOLN: 80.0000 mg | INTRAVENOUS | Status: DC

## 2015-03-14 MED ORDER — SODIUM CHLORIDE 0.9 % IV BOLUS (SEPSIS)
1000.0000 mL | Freq: Once | INTRAVENOUS | Status: AC
  Administered 2015-03-14: 1000 mL via INTRAVENOUS

## 2015-03-14 MED ORDER — PROPOFOL 10 MG/ML IV BOLUS
INTRAVENOUS | Status: AC
Start: 2015-03-14 — End: 2015-03-14
  Filled 2015-03-14: qty 20

## 2015-03-14 MED ORDER — MORPHINE SULFATE 2 MG/ML IJ SOLN
2.0000 mg | INTRAMUSCULAR | Status: DC | PRN
  Administered 2015-03-14 – 2015-03-16 (×10): 2 mg via INTRAVENOUS
  Filled 2015-03-14 (×10): qty 1

## 2015-03-14 MED ORDER — MEPERIDINE HCL 50 MG/ML IJ SOLN: 6.2500 mg | INTRAMUSCULAR | Status: DC | PRN

## 2015-03-14 MED ORDER — KCL IN DEXTROSE-NACL 20-5-0.45 MEQ/L-%-% IV SOLN
INTRAVENOUS | Status: DC
  Administered 2015-03-14 – 2015-03-15 (×2): via INTRAVENOUS
  Administered 2015-03-15: 1000 mL via INTRAVENOUS
  Administered 2015-03-15 – 2015-03-16 (×2): via INTRAVENOUS
  Filled 2015-03-14 (×6): qty 1000

## 2015-03-14 MED ORDER — GENTAMICIN SULFATE 40 MG/ML IJ SOLN
5.0000 mg/kg | Freq: Once | INTRAVENOUS | Status: AC
  Administered 2015-03-14: 330 mg via INTRAVENOUS
  Filled 2015-03-14: qty 8.25

## 2015-03-14 MED ORDER — OXYCODONE-ACETAMINOPHEN 5-325 MG PO TABS
1.0000 | ORAL_TABLET | Freq: Three times a day (TID) | ORAL | Status: DC | PRN
  Administered 2015-03-14 – 2015-03-16 (×3): 1 via ORAL
  Filled 2015-03-14 (×3): qty 1

## 2015-03-14 MED ORDER — ONDANSETRON HCL 4 MG/2ML IJ SOLN: 4.0000 mg | INTRAMUSCULAR | Status: DC | PRN

## 2015-03-14 MED ORDER — HYDROMORPHONE HCL 1 MG/ML IJ SOLN
1.0000 mg | Freq: Once | INTRAMUSCULAR | Status: AC
Start: 2015-03-14 — End: 2015-03-14
  Administered 2015-03-14: 1 mg via INTRAVENOUS
  Filled 2015-03-14: qty 1

## 2015-03-14 MED ORDER — 0.9 % SODIUM CHLORIDE (POUR BTL) OPTIME
TOPICAL | Status: DC | PRN
  Administered 2015-03-14: 1000 mL

## 2015-03-14 MED ORDER — ONDANSETRON HCL 4 MG/2ML IJ SOLN
INTRAMUSCULAR | Status: DC | PRN
  Administered 2015-03-14: 4 mg via INTRAVENOUS

## 2015-03-14 MED ORDER — MORPHINE SULFATE 4 MG/ML IJ SOLN
4.0000 mg | Freq: Once | INTRAMUSCULAR | Status: AC
  Administered 2015-03-14: 4 mg via INTRAVENOUS
  Filled 2015-03-14: qty 1

## 2015-03-14 MED ORDER — LIDOCAINE HCL (CARDIAC) 10 MG/ML IV SOLN
INTRAVENOUS | Status: DC | PRN
  Administered 2015-03-14: 100 mg via INTRAVENOUS
  Administered 2015-03-14: 25 mg via INTRAVENOUS

## 2015-03-14 MED ORDER — SODIUM CHLORIDE 0.9 % IR SOLN
Status: DC | PRN
  Administered 2015-03-14: 4000 mL

## 2015-03-14 MED ORDER — CEFAZOLIN SODIUM-DEXTROSE 2-3 GM-% IV SOLR
INTRAVENOUS | Status: AC
  Filled 2015-03-14: qty 50

## 2015-03-14 MED ORDER — METOPROLOL TARTRATE 12.5 MG HALF TABLET
12.5000 mg | ORAL_TABLET | Freq: Once | ORAL | Status: DC
  Filled 2015-03-14 (×2): qty 1

## 2015-03-14 MED ORDER — FENTANYL CITRATE (PF) 100 MCG/2ML IJ SOLN
INTRAMUSCULAR | Status: DC | PRN
  Administered 2015-03-14 (×2): 50 ug via INTRAVENOUS
  Administered 2015-03-14: 100 ug via INTRAVENOUS
  Administered 2015-03-14: 50 ug via INTRAVENOUS

## 2015-03-14 MED ORDER — MORPHINE SULFATE 2 MG/ML IJ SOLN
2.0000 mg | INTRAMUSCULAR | Status: DC | PRN
  Administered 2015-03-14: 2 mg via INTRAVENOUS
  Filled 2015-03-14: qty 1

## 2015-03-14 MED ORDER — OXYCODONE HCL 5 MG PO TABS
5.0000 mg | ORAL_TABLET | Freq: Three times a day (TID) | ORAL | Status: DC | PRN
  Administered 2015-03-15 – 2015-03-16 (×2): 5 mg via ORAL
  Filled 2015-03-14 (×2): qty 1

## 2015-03-14 MED ORDER — PROPOFOL 10 MG/ML IV BOLUS
INTRAVENOUS | Status: DC | PRN
  Administered 2015-03-14: 40 mg via INTRAVENOUS
  Administered 2015-03-14: 70 mg via INTRAVENOUS
  Administered 2015-03-14: 150 mg via INTRAVENOUS
  Administered 2015-03-14: 50 mg via INTRAVENOUS

## 2015-03-14 MED ORDER — KETOROLAC TROMETHAMINE 30 MG/ML IJ SOLN
30.0000 mg | Freq: Once | INTRAMUSCULAR | Status: AC
  Administered 2015-03-14: 30 mg via INTRAVENOUS
  Filled 2015-03-14: qty 1

## 2015-03-14 MED ORDER — NITROGLYCERIN 0.4 MG SL SUBL: 0.4000 mg | SUBLINGUAL_TABLET | SUBLINGUAL | Status: DC | PRN

## 2015-03-14 MED ORDER — BELLADONNA ALKALOIDS-OPIUM 16.2-60 MG RE SUPP
RECTAL | Status: AC
  Filled 2015-03-14: qty 1

## 2015-03-14 MED ORDER — ACETAMINOPHEN 325 MG PO TABS: 650.0000 mg | ORAL_TABLET | ORAL | Status: DC | PRN

## 2015-03-14 MED ORDER — GENTAMICIN SULFATE 40 MG/ML IJ SOLN
7.0000 mg/kg | INTRAVENOUS | Status: DC
  Administered 2015-03-15: 470 mg via INTRAVENOUS
  Filled 2015-03-14: qty 11.75

## 2015-03-14 MED ORDER — GABAPENTIN 400 MG PO CAPS
1200.0000 mg | ORAL_CAPSULE | Freq: Three times a day (TID) | ORAL | Status: DC
  Administered 2015-03-14 – 2015-03-16 (×5): 1200 mg via ORAL
  Filled 2015-03-14 (×6): qty 3

## 2015-03-14 MED ORDER — PHENYLEPHRINE HCL 10 MG/ML IJ SOLN
INTRAMUSCULAR | Status: DC | PRN
  Administered 2015-03-14 (×2): 160 ug via INTRAVENOUS
  Administered 2015-03-14: 120 ug via INTRAVENOUS

## 2015-03-14 MED ORDER — IOHEXOL 300 MG/ML  SOLN
INTRAMUSCULAR | Status: DC | PRN
  Administered 2015-03-14: 16 mL

## 2015-03-14 MED ORDER — METOPROLOL TARTRATE 25 MG PO TABS
12.5000 mg | ORAL_TABLET | Freq: Two times a day (BID) | ORAL | Status: DC
  Administered 2015-03-14 – 2015-03-16 (×4): 12.5 mg via ORAL
  Filled 2015-03-14 (×4): qty 1

## 2015-03-14 MED ORDER — SUCCINYLCHOLINE CHLORIDE 20 MG/ML IJ SOLN
INTRAMUSCULAR | Status: DC | PRN
  Administered 2015-03-14 (×2): 40 mg via INTRAVENOUS
  Administered 2015-03-14 (×2): 20 mg via INTRAVENOUS
  Administered 2015-03-14: 120 mg via INTRAVENOUS

## 2015-03-14 MED ORDER — ONDANSETRON HCL 4 MG/2ML IJ SOLN
INTRAMUSCULAR | Status: AC
  Filled 2015-03-14: qty 2

## 2015-03-14 MED ORDER — OMEPRAZOLE 20 MG PO CPDR
20.0000 mg | DELAYED_RELEASE_CAPSULE | Freq: Every day | ORAL | Status: DC
  Administered 2015-03-14 – 2015-03-16 (×3): 20 mg via ORAL
  Filled 2015-03-14 (×3): qty 1

## 2015-03-14 MED ORDER — LIDOCAINE HCL (CARDIAC) 20 MG/ML IV SOLN
INTRAVENOUS | Status: AC
  Filled 2015-03-14: qty 5

## 2015-03-14 MED ORDER — ONDANSETRON HCL 4 MG/2ML IJ SOLN
4.0000 mg | Freq: Four times a day (QID) | INTRAMUSCULAR | Status: DC | PRN
  Administered 2015-03-14: 4 mg via INTRAVENOUS
  Filled 2015-03-14: qty 2

## 2015-03-14 MED ORDER — LIDOCAINE HCL 2 % EX GEL
CUTANEOUS | Status: AC
  Filled 2015-03-14: qty 10

## 2015-03-14 MED ORDER — TICAGRELOR 90 MG PO TABS
90.0000 mg | ORAL_TABLET | Freq: Two times a day (BID) | ORAL | Status: DC
  Administered 2015-03-14 – 2015-03-16 (×4): 90 mg via ORAL
  Filled 2015-03-14 (×4): qty 1

## 2015-03-14 MED ORDER — CEFAZOLIN SODIUM-DEXTROSE 2-3 GM-% IV SOLR
2.0000 g | INTRAVENOUS | Status: AC
  Administered 2015-03-14: 2 g via INTRAVENOUS

## 2015-03-14 SURGICAL SUPPLY — 11 items
BAG URO CATCHER STRL LF (DRAPE) ×3 IMPLANT
BASKET ZERO TIP NITINOL 2.4FR (BASKET) IMPLANT
CATH URET 5FR 28IN OPEN ENDED (CATHETERS) ×3 IMPLANT
GLOVE BIOGEL M STRL SZ7.5 (GLOVE) ×3 IMPLANT
GOWN STRL REUS W/TWL XL LVL3 (GOWN DISPOSABLE) ×3 IMPLANT
GUIDEWIRE ANG ZIPWIRE 038X150 (WIRE) ×3 IMPLANT
GUIDEWIRE STR DUAL SENSOR (WIRE) IMPLANT
PACK CYSTO (CUSTOM PROCEDURE TRAY) ×3 IMPLANT
STENT URET 6FRX24 CONTOUR (STENTS) IMPLANT
SYR 20CC LL (SYRINGE) ×3 IMPLANT
WIRE COONS/BENSON .038X145CM (WIRE) ×3 IMPLANT

## 2015-03-14 NOTE — Consult Note (Signed)
Reason for consult: Left percutaneous nephrostomy  Referring Physician(s): McDiarmid,S  History of Present Illness: Tanya Lowe is a 44 y.o. female with past medical history significant for hypertension, fibromyalgia, tobacco abuse, stress incontinence, coronary artery disease with unstable angina/prior NSTEMI and stenting of RCA/LAD in January 2016 . Patient also has known history of nephrolithiasis with previous lithotripsy . She recently presented to the ED with increasing left flank pain, nausea, vomiting. She underwent cystoscopy with failed left ureteral stent placement on 5/25 secondary to an obstructing left distal ureteral stone. Renal ultrasound performed today revealed mild left-sided pelvicaliectasis. The patient is currently afebrile with WBC of 10.0, creatinine of 0.67 . Urology has now submitted request for left percutaneous nephrostomy .   Past Medical History  Diagnosis Date  . Hypertension   . Arthritis   . Fibromyalgia   . Kidney stones     hx of w previous lithotripsy    Past Surgical History  Procedure Laterality Date  . Spinal fusion      L5-S1  . Tubal ligation    . Left heart catheterization with coronary angiogram N/A 10/25/2014    Procedure: LEFT HEART CATHETERIZATION WITH CORONARY ANGIOGRAM;  Surgeon: Kathleene Hazelhristopher D McAlhany, MD;  [facility] C[facility] LAB;  Service: Cardiovascular;  Laterality: N/A;  . Lithotripsy  about 2011    Allergies: Prednisone  Medications: Prior to Admission medications   Medication Sig Start Date End Date Taking? Authorizing Provider  acetaminophen (TYLENOL) 500 MG tablet Take 1,000 mg by mouth every 6 (six) hours as needed.   Yes Historical Provider, MD  aspirin 81 MG chewable tablet Chew 1 tablet (81 mg total) by mouth daily. 10/26/14  Yes Dwana MelenaBryan W Hager, PA-C  atorvastatin (LIPITOR) 80 MG tablet Take 1 tablet (80 mg total) by mouth daily at 6 PM. 10/26/14  Yes Dwana MelenaBryan W Hager, PA-C  DULoxetine (CYMBALTA) 60 MG capsule Take 60 mg  by mouth daily.   Yes Historical Provider, MD  gabapentin (NEURONTIN) 600 MG tablet Take 1,200 mg by mouth 3 (three) times daily.   Yes Historical Provider, MD  HYDROcodone-acetaminophen (NORCO) 5-325 MG per tablet Take 2 tablets by mouth every 4 (four) hours as needed. 03/13/15  Yes Blane OharaJoshua Zavitz, MD  metoprolol tartrate (LOPRESSOR) 25 MG tablet Take 0.5 tablets (12.5 mg total) by mouth 2 (two) times daily. 10/26/14  Yes Dwana MelenaBryan W Hager, PA-C  nitroGLYCERIN (NITROSTAT) 0.4 MG SL tablet Place 1 tablet (0.4 mg total) under the tongue every 5 (five) minutes as needed for chest pain. 10/26/14  Yes Dwana MelenaBryan W Hager, PA-C  omeprazole (PRILOSEC) 20 MG capsule Take 20 mg by mouth daily.   Yes Historical Provider, MD  oxyCODONE-acetaminophen (PERCOCET) 10-325 MG per tablet Take 1 tablet by mouth 3 (three) times daily as needed for pain.   Yes Historical Provider, MD  sulfamethoxazole-trimethoprim (BACTRIM DS,SEPTRA DS) 800-160 MG per tablet Take 1 tablet by mouth 2 (two) times daily. 03/13/15 03/20/15 Yes Blane OharaJoshua Zavitz, MD  ticagrelor (BRILINTA) 90 MG TABS tablet Take 1 tablet (90 mg total) by mouth 2 (two) times daily. 10/26/14  Yes Dwana MelenaBryan W Hager, PA-C  tiZANidine (ZANAFLEX) 4 MG tablet Take 4 mg by mouth every 8 (eight) hours as needed for muscle spasms.  02/22/15  Yes Historical Provider, MD     Family History  Problem Relation Age of Onset  . Diabetes Mother   . Stroke Mother   . Seizures Other   . Cancer Other   . Diabetes Other  History   Social History  . Marital Status: Divorced    Spouse Name: N/A  . Number of Children: N/A  . Years of Education: N/A   Social History Main Topics  . Smoking status: Current Every Day Smoker -- 0.25 packs/day for 24 years    Types: Cigarettes    Start date: 10/25/1990  . Smokeless tobacco: Never Used  . Alcohol Use: No  . Drug Use: No  . Sexual Activity: Not on file   Other Topics Concern  . None   Social History Narrative      Review of Systems see  above; patient currently denies chest pain, dyspnea, abdominal or back pain, nausea vomiting. She does have occasional headaches and nonproductive cough.   Vital Signs: BP 110/65 mmHg  Pulse 84  Temp(Src) 98 F (36.7 C) (Oral)  Resp 21  Ht  (1.575 m)  Wt 206 lb (93.441 kg)  BMI 37.67 kg/m2  SpO2 96%  LMP   Physical Exam patient is awake, alert. Chest is clear to auscultation bilaterally. Heart with regular rate and rhythm. Abdomen obese, soft, positive bowel sounds, nontender. Extremities with full range of motion and no significant edema.   Mallampati Score:     Imaging: US Renal  03/14/2015   CLINICAL DATA:  Left-sided flank pain for 3 days.  Fever.  EXAM: RENAL / URINARY TRACT ULTRASOUND COMPLETE  COMPARISON:  CT abdomen pelvis- 03/13/2015  FINDINGS: Examination is degraded due to patient body habitus and poor sonographic window.  Right Kidney:  Normal cortical thickness, echogenicity and size, measuring 11.8 cm in length. No focal renal lesions. No echogenic renal stones. No urinary obstruction.  Left Kidney:  Normal cortical thickness, echogenicity and size, measuring 12.6 cm in length. No focal renal lesions. No echogenic renal stones. Grossly unchanged mild left-sided pelvicaliectasis, similar to recently obtained abdominal CT.  Bladder:  Appears normal for degree of bladder distention. Only a right-sided ureteral jet is identified (image 23).  IMPRESSION: 1. Grossly unchanged mild left-sided pelvicaliectasis, similar to recently obtained abdominal CT. 2. No evidence of right-sided urinary obstruction.   Electronically Signed   By: Simonne Come M.D.   On: 03/14/2015 01:25   Ct Renal Stone Study  03/13/2015   CLINICAL DATA:  Left flank pain 2 days with fever. Renal stones in the past.  EXAM: CT ABDOMEN AND PELVIS WITHOUT CONTRAST  TECHNIQUE: Multidetector CT imaging of the abdomen and pelvis was performed following the standard protocol without IV contrast.  COMPARISON:  02/07/2008   FINDINGS: Lung bases demonstrate subtle atelectatic change. Calcified atherosclerotic plaque over the right coronary artery.  Abdominal images demonstrate a normal liver, spleen, pancreas, gallbladder and adrenal glands. Appendix is normal. There is mild-to-moderate calcified atherosclerotic plaque over the abdominal aorta and iliac arteries. Small umbilical hernia containing only peritoneal fat.  Kidneys are normal in size, shape and position. No right renal stones or hydronephrosis. There is mild left-sided hydronephrosis and stranding of the left perinephric fat. No left renal stones. There is mild dilatation of the left ureter as there is a 5.6 mm stone at the left UVJ causing low-grade obstruction. The right ureter is normal.  Pelvic images demonstrate the bladder, uterus, ovaries and rectum to be normal. There is posterior spinal fusion hardware at the L4-5 level intact with intervertebral cage at the L4-5 level. Minimal spondylosis of the lumbar spine.  IMPRESSION: 5.6 mm stone over the left UVJ causing low-grade obstruction.  Age advanced atherosclerotic coronary artery disease. Age advanced  coronary artery atherosclerosis. Recommend assessment of coronary risk factors and consideration of medical therapy.c  Tiny umbilical hernia containing only peritoneal fat.   Electronically Signed   By: Elberta Fortis M.D.   On: 03/13/2015 13:51    Labs:  CBC:  Recent Labs  10/25/14 0859 10/26/14 0522 03/13/15 1142 03/14/15 0020  WBC 7.4 6.5 10.1 10.0  HGB 12.2 12.2 12.6 12.0  HCT 38.1 38.7 38.3 36.8  PLT 263 237 194 188    COAGS:  Recent Labs  10/24/14 1820  INR 1.10  APTT 28    BMP:  Recent Labs  10/25/14 0412 10/26/14 0522 03/13/15 1142 03/14/15 0020  NA 141 140 136 137  K 3.6 4.3 4.1 3.4*  CL 108 108 99* 102  CO2 GLUCOSE 94 106* 110* 112*  BUN 7 5* 9 8  CALCIUM 8.6 9.0 9.1 8.8*  CREATININE 0.63 0.69 0.78 0.67  GFRNONAA >90 >90 >60 >60  GFRAA >90 >90 >60 >60     LIVER FUNCTION TESTS:  Recent Labs  10/24/14 1820 10/25/14 0412 03/13/15 1142 03/14/15 0020  BILITOT 0.2* 0.3 0.6 0.4  AST ALT ALKPHOS 89 84 99 95  PROT 6.8 6.1 7.4 7.2  ALBUMIN 3.8 3.3* 3.6 3.3*    TUMOR MARKERS: No results for input(s): AFPTM, CEA, CA199, CHROMGRNA in the last 8760 hours.  Assessment and Plan: Tanya Lowe is a 44 y.o. female with past medical history significant for hypertension, fibromyalgia, tobacco abuse, stress incontinence, coronary artery disease with unstable angina/prior NSTEMI and stenting of RCA/LAD in January 2016 (on Brilinta and ASA) . Patient also has known history of nephrolithiasis with previous lithotripsy . She recently presented to the ED with increasing left flank pain, nausea, vomiting. She underwent cystoscopy with failed left ureteral stent placement on 5/25 secondary to an obstructing left distal ureteral stone. Renal ultrasound performed today revealed mild left-sided pelvicaliectasis. The patient is currently afebrile with WBC of 10.0, creatinine of 0.67 . Urology has now submitted request for left percutaneous nephrostomy. Recent imaging studies and clinical history have been reviewed by Dr. Miles Costain. Case has also been discussed with Dr. McDiarmid and Dr. Clifton James (cardiology). Per cardiology, Brilinta cannot be discontinued at this time secondary to extensive coronary disease and recent  stenting. Plan is for repeat renal ultrasound on 5/26 to assess degree of left hydronephrosis and reevaluate clinical status/labs in am with Dr. Lowella Dandy for potential nephrostomy. Details/risks of nephrostomy, including but not limited to, internal bleeding, sepsis, inability to access kidney, injury to adjacent organs, need for emergency surgery and possible death have been discussed with patient and family with their understanding and consent.    Signed: D. Jeananne Rama 03/14/2015, 4:02 PM   I spent a total of 40 minutes in face  to face in clinical consultation, greater than 50% of which was counseling/coordinating care for left percutaneous nephrostomy

## 2015-03-14 NOTE — Interval H&P Note (Signed)
History and Physical Interval Note:  03/14/2015 12:29 PM  Lindell Liz MaladyM Evon  has presented today for surgery, with the diagnosis of left ureteral obstruction  The various methods of treatment have been discussed with the patient and family. After consideration of risks, benefits and other options for treatment, the patient has consented to  Procedure(s): CYSTOSCOPY WITH LEFT RETROGRADE PYELOGRAM, AND LEFT STENT PLACEMENT (Left) as a surgical intervention .  The patient's history has been reviewed, patient examined, no change in status, stable for surgery.  I have reviewed the patient's chart and labs.  Questions were answered to the patient's satisfaction.     Marieta Markov A

## 2015-03-14 NOTE — Discharge Instructions (Signed)
Kidney Stones Tanya Lowe, go to urology this morning at 8am to be seen for your kidney stone.  If symptoms worsen, come back to the ED immediately. Thank you. Kidney stones (urolithiasis) are solid masses that form inside your kidneys. The intense pain is caused by the stone moving through the kidney, ureter, bladder, and urethra (urinary tract). When the stone moves, the ureter starts to spasm around the stone. The stone is usually passed in your pee (urine).  HOME CARE  Drink enough fluids to keep your pee clear or pale yellow. This helps to get the stone out.  Strain all pee through the provided strainer. Do not pee without peeing through the strainer, not even once. If you pee the stone out, catch it in the strainer. The stone may be as small as a grain of salt. Take this to your doctor. This will help your doctor figure out what you can do to try to prevent more kidney stones.  Only take medicine as told by your doctor.  Follow up with your doctor as told.  Get follow-up X-rays as told by your doctor. GET HELP IF: You have pain that gets worse even if you have been taking pain medicine. GET HELP RIGHT AWAY IF:   Your pain does not get better with medicine.  You have a fever or shaking chills.  Your pain increases and gets worse over 18 hours.  You have new belly (abdominal) pain.  You feel faint or pass out.  You are unable to pee. MAKE SURE YOU:   Understand these instructions.  Will watch your condition.  Will get help right away if you are not doing well or get worse. Document Released: 03/24/2008 Document Revised: 06/08/2013 Document Reviewed: 03/09/2013 [facility] Patient Information 2015 [place], [place]. This information is not intended to replace advice given to you by your health care provider. Make sure you discuss any questions you have with your health care provider.

## 2015-03-14 NOTE — Anesthesia Postprocedure Evaluation (Signed)
  Anesthesia Post-op Note  Patient: Tanya Lowe  Procedure(s) Performed: Procedure(s): CYSTOSCOPY WITH LEFT RETROGRADE PYELOGRAM (Left)  Patient Location: PACU  Anesthesia Type:General  Level of Consciousness: awake, alert , patient cooperative and responds to stimulation  Airway and Oxygen Therapy: Patient Spontanous Breathing  Post-op Pain: none  Post-op Assessment: Post-op Vital signs reviewed, Patient's Cardiovascular Status Stable, Respiratory Function Stable, Patent Airway, No signs of Nausea or vomiting and Pain level controlled  Post-op Vital Signs: Reviewed and stable  Last Vitals:  Filed Vitals:   03/14/15 1512  BP: 110/65  Pulse: 84  Temp: 36.7 C  Resp:     Complications: No apparent anesthesia complications

## 2015-03-14 NOTE — ED Provider Notes (Signed)
CSN: [id number]     Arrival date & time 03/13/15  2057 History   First MD Initiated Contact with Patient 03/14/15 0046     Chief Complaint  Patient presents with  . Flank Pain  . Fever     (Consider location/radiation/quality/duration/timing/severity/associated sxs/prior Treatment) HPI  Tanya Lowe is a 44 y.o. female with past medical history of hypertension, fibromyalgia, nephrolithiasis presenting today with flank pain and fever. Patient was recently seen in the emergency department for the same. CT scan revealed a kidney stone and urinalysis revealed an infection. She was discharged with follow-up with urology this morning. Patient comes back to emergency department because she had fevers and worsening nausea with vomiting. She denies taking any meds since her discharge. She has no abdominal pain. She has no dysuria or hematuria. She is only having severe left-sided flank pain. Patient has no further concerns.  10 Systems reviewed and are negative for acute change except as noted in the HPI.    Past Medical History  Diagnosis Date  . Hypertension   . Arthritis   . Fibromyalgia    Past Surgical History  Procedure Laterality Date  . Spinal fusion      L5-S1  . Tubal ligation    . Left heart catheterization with coronary angiogram N/A 10/25/2014    Procedure: LEFT HEART CATHETERIZATION WITH CORONARY ANGIOGRAM;  Surgeon: Kathleene Hazel, MD;  Location: [facility] CATH LAB;  Service: Cardiovascular;  Laterality: N/A;   Family History  Problem Relation Age of Onset  . Diabetes Mother   . Stroke Mother   . Seizures Other   . Cancer Other   . Diabetes Other    History  Substance Use Topics  . Smoking status: Current Every Day Smoker -- 0.25 packs/day for 24 years    Types: Cigarettes    Start date: 10/25/1990  . Smokeless tobacco: Never Used  . Alcohol Use: No   OB History    Gravida Para Term Preterm AB TAB SAB Ectopic Multiple Living   Review of  Systems    Allergies  Prednisone  Home Medications   Prior to Admission medications   Medication Sig Start Date End Date Taking? Authorizing Provider  acetaminophen (TYLENOL) 500 MG tablet Take 1,000 mg by mouth every 6 (six) hours as needed.   Yes Historical Provider, MD  aspirin 81 MG chewable tablet Chew 1 tablet (81 mg total) by mouth daily. 10/26/14  Yes Dwana Melena, PA-C  atorvastatin (LIPITOR) 80 MG tablet Take 1 tablet (80 mg total) by mouth daily at 6 PM. 10/26/14  Yes Dwana Melena, PA-C  DULoxetine (CYMBALTA) 60 MG capsule Take 60 mg by mouth daily.   Yes Historical Provider, MD  gabapentin (NEURONTIN) 600 MG tablet Take 1,200 mg by mouth 3 (three) times daily.   Yes Historical Provider, MD  metoprolol tartrate (LOPRESSOR) 25 MG tablet Take 0.5 tablets (12.5 mg total) by mouth 2 (two) times daily. 10/26/14  Yes Dwana Melena, PA-C  omeprazole (PRILOSEC) 20 MG capsule Take 20 mg by mouth daily.   Yes Historical Provider, MD  oxyCODONE-acetaminophen (PERCOCET) 10-325 MG per tablet Take 1 tablet by mouth 3 (three) times daily as needed for pain.   Yes Historical Provider, MD  ticagrelor (BRILINTA) 90 MG TABS tablet Take 1 tablet (90 mg total) by mouth 2 (two) times daily. 10/26/14  Yes Dwana Melena, PA-C  tiZANidine (ZANAFLEX)  4 MG tablet Take 4 mg by mouth every 8 (eight) hours as needed for muscle spasms.  02/22/15  Yes Historical Provider, MD  HYDROcodone-acetaminophen (NORCO) 5-325 MG per tablet Take 2 tablets by mouth every 4 (four) hours as needed. 03/13/15   Blane Ohara, MD  nitroGLYCERIN (NITROSTAT) 0.4 MG SL tablet Place 1 tablet (0.4 mg total) under the tongue every 5 (five) minutes as needed for chest pain. 10/26/14   Dwana Melena, PA-C  sulfamethoxazole-trimethoprim (BACTRIM DS,SEPTRA DS) 800-160 MG per tablet Take 1 tablet by mouth 2 (two) times daily. 03/13/15 03/20/15  Blane Ohara, MD   BP 142/76 mmHg  Pulse 112  Temp(Src) 98.6 F (37 C) (Oral)  Resp 18  Ht  (1.575  m)  Wt 199 lb (90.266 kg)  BMI 36.39 kg/m2  SpO2 93% Physical Exam  Constitutional: She is oriented to person, place, and time. She appears well-developed and well-nourished. She appears distressed.  HENT:  Head: Normocephalic and atraumatic.  Nose: Nose normal.  Mouth/Throat: Oropharynx is clear and moist. No oropharyngeal exudate.  Eyes: Conjunctivae and EOM are normal. Pupils are equal, round, and reactive to light. No scleral icterus.  Neck: Normal range of motion. Neck supple. No JVD present. No tracheal deviation present. No thyromegaly present.  Cardiovascular: Regular rhythm and normal heart sounds.  Exam reveals no gallop and no friction rub.   No murmur heard. Tachycardia  Pulmonary/Chest: Effort normal and breath sounds normal. No respiratory distress. She has no wheezes. She exhibits no tenderness.  Abdominal: Soft. Bowel sounds are normal. She exhibits no distension and no mass. There is no tenderness. There is no rebound and no guarding.  No CVA tenderness bilaterally.  Musculoskeletal: Normal range of motion. She exhibits no edema or tenderness.  Lymphadenopathy:    She has no cervical adenopathy.  Neurological: She is alert and oriented to person, place, and time. No cranial nerve deficit. She exhibits normal muscle tone.  Skin: Skin is warm and dry. No rash noted. She is not diaphoretic. No erythema. No pallor.  Nursing note and vitals reviewed.   ED Course  Procedures (including critical care time) Labs Review Labs Reviewed  URINALYSIS, ROUTINE W REFLEX MICROSCOPIC - Abnormal; Notable for the following:    Hgb urine dipstick SMALL (*)    Protein, ur 30 (*)    Leukocytes, UA SMALL (*)    All other components within normal limits  CBC WITH DIFFERENTIAL/PLATELET - Abnormal; Notable for the following:    Neutrophils Relative % 79 (*)    Neutro Abs 7.8 (*)    All other components within normal limits  COMPREHENSIVE METABOLIC PANEL - Abnormal; Notable for the  following:    Potassium 3.4 (*)    Glucose, Bld 112 (*)    Calcium 8.8 (*)    Albumin 3.3 (*)    All other components within normal limits  LIPASE, BLOOD - Abnormal; Notable for the following:    Lipase 14 (*)    All other components within normal limits  PREGNANCY, URINE  I-STAT CG4 LACTIC ACID, ED    Imaging Review US Renal  03/14/2015   CLINICAL DATA:  Left-sided flank pain for 3 days.  Fever.  EXAM: RENAL / URINARY TRACT ULTRASOUND COMPLETE  COMPARISON:  CT abdomen pelvis- 03/13/2015  FINDINGS: Examination is degraded due to patient body habitus and poor sonographic window.  Right Kidney:  Normal cortical thickness, echogenicity and size, measuring 11.8 cm in length. No focal renal lesions. No echogenic renal  stones. No urinary obstruction.  Left Kidney:  Normal cortical thickness, echogenicity and size, measuring 12.6 cm in length. No focal renal lesions. No echogenic renal stones. Grossly unchanged mild left-sided pelvicaliectasis, similar to recently obtained abdominal CT.  Bladder:  Appears normal for degree of bladder distention. Only a right-sided ureteral jet is identified (image 23).  IMPRESSION: 1. Grossly unchanged mild left-sided pelvicaliectasis, similar to recently obtained abdominal CT. 2. No evidence of right-sided urinary obstruction.   Electronically Signed   By: Simonne ComeJohn  Watts M.D.   On: 03/14/2015 01:25   Ct Renal Stone Study  03/13/2015   CLINICAL DATA:  Left flank pain 2 days with fever. Renal stones in the past.  EXAM: CT ABDOMEN AND PELVIS WITHOUT CONTRAST  TECHNIQUE: Multidetector CT imaging of the abdomen and pelvis was performed following the standard protocol without IV contrast.  COMPARISON:  02/07/2008  FINDINGS: Lung bases demonstrate subtle atelectatic change. Calcified atherosclerotic plaque over the right coronary artery.  Abdominal images demonstrate a normal liver, spleen, pancreas, gallbladder and adrenal glands. Appendix is normal. There is mild-to-moderate  calcified atherosclerotic plaque over the abdominal aorta and iliac arteries. Small umbilical hernia containing only peritoneal fat.  Kidneys are normal in size, shape and position. No right renal stones or hydronephrosis. There is mild left-sided hydronephrosis and stranding of the left perinephric fat. No left renal stones. There is mild dilatation of the left ureter as there is a 5.6 mm stone at the left UVJ causing low-grade obstruction. The right ureter is normal.  Pelvic images demonstrate the bladder, uterus, ovaries and rectum to be normal. There is posterior spinal fusion hardware at the L4-5 level intact with intervertebral cage at the L4-5 level. Minimal spondylosis of the lumbar spine.  IMPRESSION: 5.6 mm stone over the left UVJ causing low-grade obstruction.  Age advanced atherosclerotic coronary artery disease. Age advanced coronary artery atherosclerosis. Recommend assessment of coronary risk factors and consideration of medical therapy.c  Tiny umbilical hernia containing only peritoneal fat.   Electronically Signed   By: Elberta Fortisaniel  Boyle M.D.   On: 03/13/2015 13:51     EKG Interpretation None      MDM   Final diagnoses:  Flank pain    Patient presents emergency department for worsening fever, nausea, vomiting in the setting of recent diagnosis of nephrolithiasis with UTI. She was already given ceftriaxone earlier today. There is no need for repeat in about. Will obtain creatinine and renal ultrasound to assess for any worsening of kidney function. Patient was given morphine and Toradol for pain control. Upon repeat evaluation the patient she states that her pain has significantly improved. Patient was given fluid bolus for tachycardia as well.  Urinalysis does not reveal an infection currently. Renal ultrasound does not show any worsening of hydronephrosis. Creatinine is also baseline. Patient was given 2 doses of morphine as well as a dose of Dilaudid for good pain control. She is  advised to keep her urology follow-up appointment for ultimate care. She now appears comfortable in no acute distress. Her vital signs remain within her normal limits and she is safe for discharge.  Tomasita CrumbleAdeleke Birdella Sippel, MD 03/14/15 [phone]

## 2015-03-14 NOTE — Anesthesia Procedure Notes (Addendum)
Procedure Name: Intubation Date/Time: 03/14/2015 1:25 PM Performed by: Illene SilverEVANS, Prerana Strayer E Pre-anesthesia Checklist: Patient identified, Emergency Drugs available, Suction available and Patient being monitored Patient Re-evaluated:Patient Re-evaluated prior to inductionOxygen Delivery Method: Circle System Utilized Preoxygenation: Pre-oxygenation with 100% oxygen Intubation Type: IV induction Ventilation: Mask ventilation without difficulty Laryngoscope Size: Mac and 4 Grade View: Grade II Tube type: Oral Number of attempts: 1 Airway Equipment and Method: Stylet and Oral airway Placement Confirmation: ETT inserted through vocal cords under direct vision,  positive ETCO2 and breath sounds checked- equal and bilateral Secured at: 21 cm Tube secured with: Tape Dental Injury: Teeth and Oropharynx as per pre-operative assessment

## 2015-03-14 NOTE — Op Note (Signed)
Preoperative diagnosis: Distal left ureteral stone and low-grade fever Postoperative diagnosis: Distal left ureteral stone and low-grade fever Surgery: Cystoscopy left ureter retrograde ureterogram and failed insertion of left ureteral stent Surgeon: Dr. Lorin PicketScott Makynzee Tigges  The patient has the above diagnoses and consented to the above procedure. Ms. Tanya Lowe has left flank pain and a low-grade temperature that has varied from normal to mildly elevated. She consented to the above procedure.  A 21 [place] scope was utilized. Bladder mucosa and trigone were normal. She may have had a faint elevation in the transmural ureter but basically the findings were normal with no significant periureteral edema  Under cystoscopic and fluoroscopic guidance I could see the distal stone and my initial attempt to pass a sensor wire was not successful. Utilizing a 5 [place] open-ended ureteral catheter I tried for approximately 20 minutes to past a Glidewire utilizing the ureteral catheter with different angles.  On 2 occasions I inserted the open-end ureteral catheter just inside the ureteral meatus and did a retrograde gently. I could see the filling defect of the stone and it would not move. I I could see the ureter nicely outlined and only mildly dilated on the left to the upper third. I did not perform fluoroscopy to the left renal pelvis. There was no injury to the ureter.  Utilizing dye in the ureter and fluoroscopy I continued to try to pass the Glidewire gently at different angles and then again the sensor wire. I had gently tried to place a ureteral catheter pass the stone but it would not easily pass.  After 30 or 40 minutes the procedure was stopped. She had efflux or urine from the left ureter. Bladder was emptied and the patient was taken to recovery. She is on blood thinners.

## 2015-03-14 NOTE — Anesthesia Preprocedure Evaluation (Addendum)
Anesthesia Evaluation  Patient identified by MRN, date of birth, ID band Patient awake    Reviewed: Allergy & Precautions, NPO status , Patient's Chart, lab work & pertinent test results  History of Anesthesia Complications Negative for: history of anesthetic complications  Airway Mallampati: II  TM Distance: >3 FB Neck ROM: Full    Dental  (+) Poor Dentition, Chipped, Loose, Missing, Dental Advisory Given   Pulmonary Current Smoker,  breath sounds clear to auscultation        Cardiovascular hypertension, Pt. on medications and Pt. on home beta blockers - angina+ CAD (2v ASCADZ), + Past MI (NSTEMI 1/16) and + Cardiac Stents (1/16 DES x3: Brilinta for 1 year) Rhythm:Regular Rate:Normal  EF 55-60%   Neuro/Psych negative neurological ROS     GI/Hepatic Neg liver ROS, GERD-  Medicated and Controlled,  Endo/Other  Morbid obesity  Renal/GU stones     Musculoskeletal  (+) Arthritis -, Fibromyalgia -, narcotic dependent  Abdominal (+) + obese,   Peds  Hematology  (+) Blood dyscrasia (Brilinta), ,   Anesthesia Other Findings   Reproductive/Obstetrics                         Anesthesia Physical Anesthesia Plan  ASA: III  Anesthesia Plan: General   Post-op Pain Management:    Induction: Intravenous  Airway Management Planned: Oral ETT  Additional Equipment:   Intra-op Plan:   Post-operative Plan: Extubation in OR  Informed Consent: I have reviewed the patients History and Physical, chart, labs and discussed the procedure including the risks, benefits and alternatives for the proposed anesthesia with the patient or authorized representative who has indicated his/her understanding and acceptance.   Dental advisory given  Plan Discussed with: CRNA and Surgeon  Anesthesia Plan Comments: (Plan routine monitors, GETA )        Anesthesia Quick Evaluation

## 2015-03-14 NOTE — Transfer of Care (Signed)
Immediate Anesthesia Transfer of Care Note  Patient: Tanya Lowe  Procedure(s) Performed: Procedure(s): CYSTOSCOPY WITH LEFT RETROGRADE PYELOGRAM (Left)  Patient Location: PACU  Anesthesia Type:General  Level of Consciousness: awake, alert , oriented and patient cooperative  Airway & Oxygen Therapy: Patient Spontanous Breathing and Patient connected to face mask oxygen  Post-op Assessment: Report given to RN, Post -op Vital signs reviewed and stable and Patient moving all extremities X 4  Post vital signs: stable  Last Vitals:  Filed Vitals:   03/14/15 1445  BP: 108/63  Pulse: 88  Temp:   Resp: 17    Complications: No apparent anesthesia complications  Pt's airway very irritable with lots of coughing.HX of smoking and secretions. Teeth in preop condition with left incisor slightly loose as preop. Lungs clear

## 2015-03-14 NOTE — ED Notes (Signed)
[facility]0800 due to need to see urology first thing in the am.  Pt lives 1.5 hours away from this site and it is easier for patient ot stay here than travel only to return.

## 2015-03-14 NOTE — Progress Notes (Signed)
Patient received via w/c from MD office. States she received an injection for pain and nausea

## 2015-03-14 NOTE — Progress Notes (Signed)
ANTIBIOTIC CONSULT NOTE - INITIAL  Pharmacy Consult for Gentamicin Indication: UTI  Allergies  Allergen Reactions  . Prednisone Hypertension    Tachycardia     Patient Measurements: Height: 5\' 2"  (157.5 cm) Weight: 206 lb (93.441 kg) IBW/kg (Calculated) : 50.1 Adjusted Body Weight:   Vital Signs: Temp: 98 F (36.7 C) (05/25 1512) Temp Source: Oral (05/25 1109) BP: 110/65 mmHg (05/25 1512) Pulse Rate: 84 (05/25 1512) Intake/Output from previous day:   Intake/Output from this shift: Total I/O In: 1600 [I.V.:1600] Out: 25 [Urine:25]  Labs:  Recent Labs  03/13/15 1142 03/14/15 0020  WBC 10.1 10.0  HGB 12.6 12.0  PLT 194 188  CREATININE 0.78 0.67   Estimated Creatinine Clearance: 93.5 mL/min (by C-G formula based on Cr of 0.67). No results for input(s): VANCOTROUGH, VANCOPEAK, VANCORANDOM, GENTTROUGH, GENTPEAK, GENTRANDOM, TOBRATROUGH, TOBRAPEAK, TOBRARND, AMIKACINPEAK, AMIKACINTROU, AMIKACIN in the last 72 hours.   Microbiology: No results found for this or any previous visit (from the past 720 hour(s)).  Medical History: Past Medical History  Diagnosis Date  . Hypertension   . Arthritis   . Fibromyalgia   . Kidney stones     hx of w previous lithotripsy   Assessment: [age] yoF with left flank pain and low-grade temperature who presented for left stent placement for infected ureteral stone earlier today however stent placement was unsuccessful.  Bactrim ordered PTA but pt has not started medication yet. Pre-op pt received ancef and gentamicin.  Pharmacy consulted to continue gentamicin for UTI. No antibiotic allergies listed.   5/25 >> Ancef >> 5/25 >> Gentamicin >>  Currently afebrile.  WBC WNL.  SCr 0.67, CrCl 94 CG (N>100). Urine culture drawn in MD office 5/24.   Goal of Therapy:  Gentamicin trough level <2 mcg/ml  Eradication of infection  Plan:  Gentamicin 7mg /kg q24h. Consider checking level if continued for several days. F/u renal function,  clinical course  Haynes Hoehnolleen Cleotilde Spadaccini, PharmD, BCPS 03/14/2015, 3:40 PM  Pager: [phone]

## 2015-03-14 NOTE — Progress Notes (Signed)
Spoke with IR who spoke with cardiology NPO midnight Agree with plan to watch for signs of sepsis Pt looks GREAT and stable now Stay on anticoagulants Family and pt educated with plan extensively and agrees with plan My partner on call aware

## 2015-03-14 NOTE — H&P (Signed)
History of Present Illness   Ms Thomas Landry followed up today for her left stone. Since Sunday, she has left flank pain, sometimes associated with nausea and vomiting, but no hematuria. She describes a low-grade temperature that was normal when seen at [facility] yesterday. The plan was to see her this morning in the office, but they would go to the emergency room if she developed fever. I have spoken to the emergency room physician, and she actually looked excellent while she was at [facility] and wanted to go home. She was started on an oral antibiotic, Septra, and her urine was sent for culture. She has Percocet at home which controls her pain, and this is for her lower back.  At baseline, she can leak with coughing and sneezing and wears 1 pad a day. She voids every 2-3 hours and gets up once at night.  She has had lumbar disc surgery. She has not had a history of a hysterectomy. Her bowel function is normal.  Apparently, she went to [facility] last night because she did develop a temperature of 102. I am not sure the mechanism, but she was seen this morning as my first patient.  There are no other modifying factors or associated signs or symptoms. There are no other aggravating or relieving factors. The presentation is moderate in severity and ongoing.      I was able to locate notes from [place]. She came in complaining of fevers and worsening nausea with vomiting. She has a history of fibromyalgia as well. She is on a number of medications listed. A renal ultrasound was performed. Right kidney was normal. She has mild left-sided caliectasis similar to the CT scan earlier. She was actually afebrile. Her serum creatinine was 0.78, and her urinalysis was negative. Actually, she did have some bacteria in the urine on microscopic analysis. White blood count was 10.1. Her pain settled with 2 administrations of IV morphine, and she was discharged. Repeat lab tests were stable.        Surgical History Problems   1. History of Back Surgery  2. History of Cath Stent Placement  Current Meds  1. Aspirin 81 MG TABS;  Therapy: (Recorded:25May2016) to Recorded  2. Atorvastatin Calcium TABS;  Therapy: (Recorded:25May2016) to Recorded  3. Brilinta TABS;  Therapy: (Recorded:25May2016) to Recorded  4. Metoprolol Tartrate TABS;  Therapy: (Recorded:25May2016) to Recorded  5. Percocet TABS;  Therapy: (Recorded:25May2016) to Recorded  6. PriLOSEC CPDR;  Therapy: (Recorded:25May2016) to Recorded  Allergies Medication   1. predniSONE  Family History Problems   1. Family history of rheumatoid arthritis (Z82.61) : Mother  Social History Problems    Denied: History of Alcohol use   Daily caffeine consumption   2 per day   Divorced   Two children   daughters  Review of Systems Genitourinary, skin, eye, otolaryngeal, hematologic/lymphatic, cardiovascular, pulmonary, endocrine, gastrointestinal, neurological and psychiatric system(s) were reviewed and pertinent findings if present are noted and are otherwise negative.  Constitutional: night sweats, but no fever.  Musculoskeletal: back pain and joint pain.    Vitals Vital Signs [Data Includes: Last 1 Day]  Recorded: 25May2016 08:40AM  Height: 5 ft 2.5 in Weight: 199 lb  BMI Calculated: 35.82 BSA Calculated: 1.92 Blood Pressure: 113 / 78, Sitting Temperature: 99.5 F, Oral Heart Rate: 116 Respiration: 18  Physical Exam Constitutional: Well nourished and well developed . No acute distress.   ENT:. The ears and nose are normal in appearance.   Neck: The   appearance of the neck is normal and no neck mass is present.   Pulmonary: No respiratory distress and normal respiratory rhythm and effort.   Cardiovascular: Heart rate and rhythm are normal . No peripheral edema.   Lymphatics: The femoral and inguinal nodes are not enlarged or tender.   Skin: Normal skin turgor, no visible rash and no visible skin  lesions.   Neuro/Psych:. Mood and affect are appropriate.   . Abdomen:    No mass or tenderness.    . Genitourinary:    Ms Robleto looks a little bit uncomfortable. She has minimal to no left CVA tenderness or abdominal tenderness. She is afebrile.      Results/Data Urine [Data Includes: Last 1 Day]   25May2016  COLOR AMBER   APPEARANCE CLEAR   SPECIFIC GRAVITY 1.025   pH 5.5   GLUCOSE NEG mg/dL  BILIRUBIN NEG   KETONE NEG mg/dL  BLOOD LARGE   PROTEIN 100 mg/dL  UROBILINOGEN 1 mg/dL  NITRITE NEG   LEUKOCYTE ESTERASE TRACE   SQUAMOUS EPITHELIAL/HPF FEW   WBC 21-50 WBC/hpf  RBC 7-10 RBC/hpf  BACTERIA MODERATE   CRYSTALS NONE SEEN   CASTS NONE SEEN    Assessment Assessed   1. Nephrolithiasis (N20.0)  2. Female stress incontinence (N39.3)  Plan Health Maintenance   1. UA With REFLEX; [Do Not Release]; Status:Resulted - Requires Verification;   Done:  25May2016 08:29AM Nephrolithiasis   2. KUB; Status:Resulted - Requires Verification;   Done: 25May2016 09:46AM  Discussion/Summary   Ms Henle has ongoing left-sided colic. At home, she reports a fever, though she has been afebrile in the emergency room and here in the office. I think we have to assume that she has an infected stone.  After a thorough review of the management options for the patient's condition the patient  elected to proceed with surgical therapy as noted above. We have discussed the potential benefits and risks of the procedure, side effects of the proposed treatment, the likelihood of the patient achieving the goals of the procedure, and any potential problems that might occur during the procedure or recuperation. Informed consent has been obtained. 

## 2015-03-15 ENCOUNTER — Encounter (HOSPITAL_COMMUNITY): Payer: Self-pay | Admitting: Urology

## 2015-03-15 ENCOUNTER — Observation Stay (HOSPITAL_COMMUNITY): Payer: Self-pay

## 2015-03-15 LAB — BASIC METABOLIC PANEL
Anion gap: 8 (ref 5–15)
BUN: 8 mg/dL (ref 6–20)
CALCIUM: 8.4 mg/dL — AB (ref 8.9–10.3)
CO2: 26 mmol/L (ref 22–32)
CREATININE: 0.74 mg/dL (ref 0.44–1.00)
Chloride: 107 mmol/L (ref 101–111)
GFR calc Af Amer: >60 mL/min (ref >60)
Glucose, Bld: 124 mg/dL — ABNORMAL HIGH (ref 65–99)
Potassium: 3.6 mmol/L (ref 3.5–5.1)
SODIUM: 141 mmol/L (ref 135–145)

## 2015-03-15 LAB — CBC
HCT: 32.1 % — ABNORMAL LOW (ref 36.0–46.0)
Hemoglobin: 10.5 g/dL — ABNORMAL LOW (ref 12.0–15.0)
MCH: 28.3 pg (ref 26.0–34.0)
MCHC: 32.7 g/dL (ref 30.0–36.0)
MCV: 86.5 fL (ref 78.0–100.0)
PLATELETS: 173 10*3/uL (ref 150–400)
RBC: 3.71 MIL/uL — ABNORMAL LOW (ref 3.87–5.11)
RDW: 13.9 % (ref 11.5–15.5)
WBC: 4.9 10*3/uL (ref 4.0–10.5)

## 2015-03-15 LAB — URINE CULTURE

## 2015-03-15 LAB — PROTIME-INR
INR: 1.11 (ref 0.00–1.49)
PROTHROMBIN TIME: 14.5 s (ref 11.6–15.2)

## 2015-03-15 LAB — APTT: APTT: 30 s (ref 24–37)

## 2015-03-15 NOTE — Care Management Note (Signed)
Case Management Note  Patient Details  Name: Tanya Lowe MRN: [id number] Date of Birth: 09/22/1971  Subjective/Objective:   44 y/o f admitted w/ureteral stone.From home.                 Action/Plan:d/c plan home.   Expected Discharge Date:  03/15/15               Expected Discharge Plan:  Home/Self Care  In-House Referral:     Discharge planning Services  CM Consult  Post Acute Care Choice:    Choice offered to:     DME Arranged:    DME Agency:     HH Arranged:    HH Agency:     Status of Service:  In process, will continue to follow  Medicare Important Message Given:    Date Medicare IM Given:    Medicare IM give by:    Date Additional Medicare IM Given:    Additional Medicare Important Message give by:     If discussed at Long Length of Stay Meetings, dates discussed:    Additional Comments:  Tanya Lowe, Tanya Pietrzyk, RN 03/15/2015, 12:25 PM

## 2015-03-15 NOTE — Progress Notes (Signed)
Afebrile Labs OK Sore but no significant flank pain; no n/v No fever Or chills To have repeat u/sound by radiology  i would like to speak to Radiology: my cell is [phone] I would like to follow her conservatively and treat UTI; plan for u-scope on Tuesday am with our without guidewire Perc is she gets ill E coli in urine pending sensitivites

## 2015-03-15 NOTE — Plan of Care (Signed)
Problem: Phase I Progression Outcomes Goal: Pain controlled with appropriate interventions Outcome: Progressing Medicated x4  With 2 mg of morphine during the night for pain in left flank with relief obtained after each dose.

## 2015-03-15 NOTE — Progress Notes (Signed)
Repeat Renal US today showed improvement in hydronephrosis, Dr. Lowella DandyHenn has discussed the case with Dr. Sherron MondayMacdiarmid today and no plans for percutaneous nephrostomy at this time. IR will sign off and if needed please feel free to contact us   Pattricia BossKoreen Lorenso Quirino PA-C Interventional Radiology  03/15/15  1:33 PM

## 2015-03-16 ENCOUNTER — Other Ambulatory Visit: Payer: Self-pay | Admitting: Cardiology

## 2015-03-16 ENCOUNTER — Other Ambulatory Visit: Payer: Self-pay | Admitting: Urology

## 2015-03-16 ENCOUNTER — Telehealth (HOSPITAL_BASED_OUTPATIENT_CLINIC_OR_DEPARTMENT_OTHER): Payer: Self-pay | Admitting: Emergency Medicine

## 2015-03-16 MED ORDER — OXYCODONE-ACETAMINOPHEN 10-325 MG PO TABS: 1.0000 | ORAL_TABLET | ORAL | Status: DC | PRN

## 2015-03-16 NOTE — Telephone Encounter (Signed)
Post ED Visit - Positive Culture Follow-up  Culture report reviewed by antimicrobial stewardship pharmacist: []  Wes Dulaney, Pharm.D., BCPS []  Celedonio MiyamotoJeremy Frens, Pharm.D., BCPS []  Georgina PillionElizabeth Martin, Pharm.D., BCPS []  Jackson CenterMinh Pham, Vermont[place]arm.D., BCPS, AAHIVP [x]  Estella HuskMichelle Turner, Pharm.D., BCPS, AAHIVP []  Elder CyphersLorie Poole, [address] BoulevardPharm.D., BCPS  Positive urine culture E. coli Treated with bactrim, organism sensitive to the same and no further patient follow-up is required at this time.  Berle MullMiller, Dreux Mcgroarty 03/16/2015, 12:03 PM

## 2015-03-16 NOTE — Discharge Summary (Signed)
Date of admission: 03/14/2015  Date of discharge: 03/16/2015  Admission diagnosis: left ureteral stone  Discharge diagnosis: Left ureter stone and UTI  Secondary diagnoses: UTI  History and Physical: For full details, please see admission history and physical. Briefly, Christiana Fuchsammy M Hottle is a 44 y.o. year old patient with the above diagnosis.   Hospital Course: Failed stent. On Brilenta and thus no PERC tube. No sepsis. E coli in urine. Stable and sent home on septra for u-scope Tuesday  Laboratory values:  Recent Labs  03/13/15 1142 03/14/15 0020 03/15/15 0440  HGB 12.6 12.0 10.5*  HCT 38.3 36.8 32.1*    Recent Labs  03/14/15 0020 03/15/15 0440  CREATININE 0.67 0.74    Disposition: Home  Discharge instruction: The patient was instructed to be ambulatory but told to refrain from heavy lifting, strenuous activity, or driving. Described  Discharge medications:    Medication List    STOP taking these medications        aspirin 81 MG chewable tablet      TAKE these medications        acetaminophen 500 MG tablet  Commonly known as:  TYLENOL  Take 1,000 mg by mouth every 6 (six) hours as needed.     atorvastatin 80 MG tablet  Commonly known as:  LIPITOR  Take 1 tablet (80 mg total) by mouth daily at 6 PM.     DULoxetine 60 MG capsule  Commonly known as:  CYMBALTA  Take 60 mg by mouth daily.     gabapentin 600 MG tablet  Commonly known as:  NEURONTIN  Take 1,200 mg by mouth 3 (three) times daily.     HYDROcodone-acetaminophen 5-325 MG per tablet  Commonly known as:  NORCO  Take 2 tablets by mouth every 4 (four) hours as needed.     metoprolol tartrate 25 MG tablet  Commonly known as:  LOPRESSOR  Take 0.5 tablets (12.5 mg total) by mouth 2 (two) times daily.     nitroGLYCERIN 0.4 MG SL tablet  Commonly known as:  NITROSTAT  Place 1 tablet (0.4 mg total) under the tongue every 5 (five) minutes as needed for chest pain.     omeprazole 20 MG capsule  Commonly  known as:  PRILOSEC  Take 20 mg by mouth daily.     oxyCODONE-acetaminophen 10-325 MG per tablet  Commonly known as:  PERCOCET  Take 1 tablet by mouth 3 (three) times daily as needed for pain.     oxyCODONE-acetaminophen 10-325 MG per tablet  Commonly known as:  PERCOCET  Take 1 tablet by mouth every 4 (four) hours as needed for pain.     sulfamethoxazole-trimethoprim 800-160 MG per tablet  Commonly known as:  BACTRIM DS,SEPTRA DS  Take 1 tablet by mouth 2 (two) times daily.     ticagrelor 90 MG Tabs tablet  Commonly known as:  BRILINTA  Take 1 tablet (90 mg total) by mouth 2 (two) times daily.     tiZANidine 4 MG tablet  Commonly known as:  ZANAFLEX  Take 4 mg by mouth every 8 (eight) hours as needed for muscle spasms.        Followup:      Follow-up Information    Follow up with Anner CreteWRENN,JOHN J, MD.   Specialty:  Urology   Why:  surgery monday   Contact information:   [address] [place] [address] [phone]

## 2015-03-16 NOTE — Progress Notes (Signed)
i spoke with pt and we both agree that back pain is from bed/chronic back issues and NOT THE STONE She wants to go home and looks excellent Has septra DS at home Instructions given regarding fever  Consent signed

## 2015-03-16 NOTE — Telephone Encounter (Signed)
Pt needed to call refill on her bottle,she will do so today

## 2015-03-16 NOTE — Progress Notes (Signed)
Vitals normal Labs yesterday normal Still having flank pain needing morphine Chronic pain meds at home for back U/sound showed no hydro and left ureteral jet yesterday E coli pan sensitive

## 2015-03-16 NOTE — Discharge Instructions (Signed)
I have reviewed discharge instructions in detail with the patient. They will follow-up with me or their physician as scheduled. My nurse will also be calling the patients as per protocol.   Procedure is Tuesday 5/31 with Dr. Annabell HowellsWrenn at 10:00. Arrive at the [facility] at 8:00 (they will be calling you). Nothing to eat or drink after midnight on Monday night. Be sure that you have someone to drive you to and from the procedure.

## 2015-03-20 ENCOUNTER — Encounter (HOSPITAL_COMMUNITY): Payer: Self-pay | Admitting: *Deleted

## 2015-03-20 ENCOUNTER — Encounter (HOSPITAL_COMMUNITY)
Admission: RE | Disposition: A | Discharge status: Home / Self Care | Payer: Self-pay | Source: Ambulatory Visit | Attending: Urology

## 2015-03-20 ENCOUNTER — Ambulatory Visit: Payer: Self-pay | Admitting: Cardiology

## 2015-03-20 ENCOUNTER — Inpatient Hospital Stay (HOSPITAL_COMMUNITY): Payer: MEDICAID | Admitting: Certified Registered Nurse Anesthetist

## 2015-03-20 ENCOUNTER — Ambulatory Visit (HOSPITAL_COMMUNITY)
Admission: RE | Admit: 2015-03-20 | Discharge: 2015-03-20 | Disposition: A | Discharge status: Home / Self Care | Payer: Self-pay | Source: Ambulatory Visit | Attending: Urology | Admitting: Urology

## 2015-03-20 ENCOUNTER — Inpatient Hospital Stay (HOSPITAL_COMMUNITY): Payer: Self-pay | Admitting: Certified Registered Nurse Anesthetist

## 2015-03-20 DIAGNOSIS — I10 Essential (primary) hypertension: Secondary | ICD-10-CM | POA: Insufficient documentation

## 2015-03-20 DIAGNOSIS — N201 Calculus of ureter: Secondary | ICD-10-CM | POA: Insufficient documentation

## 2015-03-20 DIAGNOSIS — I252 Old myocardial infarction: Secondary | ICD-10-CM | POA: Insufficient documentation

## 2015-03-20 DIAGNOSIS — M797 Fibromyalgia: Secondary | ICD-10-CM | POA: Insufficient documentation

## 2015-03-20 DIAGNOSIS — Z79899 Other long term (current) drug therapy: Secondary | ICD-10-CM | POA: Insufficient documentation

## 2015-03-20 DIAGNOSIS — N393 Stress incontinence (female) (male): Secondary | ICD-10-CM | POA: Insufficient documentation

## 2015-03-20 DIAGNOSIS — Z955 Presence of coronary angioplasty implant and graft: Secondary | ICD-10-CM | POA: Insufficient documentation

## 2015-03-20 DIAGNOSIS — Z7982 Long term (current) use of aspirin: Secondary | ICD-10-CM | POA: Insufficient documentation

## 2015-03-20 DIAGNOSIS — Z7902 Long term (current) use of antithrombotics/antiplatelets: Secondary | ICD-10-CM | POA: Insufficient documentation

## 2015-03-20 DIAGNOSIS — M1990 Unspecified osteoarthritis, unspecified site: Secondary | ICD-10-CM | POA: Insufficient documentation

## 2015-03-20 DIAGNOSIS — Z87891 Personal history of nicotine dependence: Secondary | ICD-10-CM | POA: Insufficient documentation

## 2015-03-20 DIAGNOSIS — N2886 Ureteritis cystica: Secondary | ICD-10-CM | POA: Insufficient documentation

## 2015-03-20 DIAGNOSIS — Z79891 Long term (current) use of opiate analgesic: Secondary | ICD-10-CM | POA: Insufficient documentation

## 2015-03-20 HISTORY — PX: CYSTOSCOPY WITH RETROGRADE PYELOGRAM, URETEROSCOPY AND STENT PLACEMENT: SHX5789

## 2015-03-20 HISTORY — PX: HOLMIUM LASER APPLICATION: SHX5852

## 2015-03-20 SURGERY — CYSTOURETEROSCOPY, WITH RETROGRADE PYELOGRAM AND STENT INSERTION
Anesthesia: General | Laterality: Left

## 2015-03-20 MED ORDER — MIDAZOLAM HCL 2 MG/2ML IJ SOLN
INTRAMUSCULAR | Status: AC
  Filled 2015-03-20: qty 2

## 2015-03-20 MED ORDER — PROPOFOL 10 MG/ML IV BOLUS
INTRAVENOUS | Status: AC
  Filled 2015-03-20: qty 20

## 2015-03-20 MED ORDER — FENTANYL CITRATE (PF) 100 MCG/2ML IJ SOLN: 25.0000 ug | INTRAMUSCULAR | Status: DC | PRN

## 2015-03-20 MED ORDER — LIDOCAINE HCL (CARDIAC) 20 MG/ML IV SOLN
INTRAVENOUS | Status: DC | PRN
  Administered 2015-03-20: 50 mg via INTRAVENOUS

## 2015-03-20 MED ORDER — METOCLOPRAMIDE HCL 5 MG/ML IJ SOLN
INTRAMUSCULAR | Status: DC | PRN
  Administered 2015-03-20: 10 mg via INTRAVENOUS

## 2015-03-20 MED ORDER — HYDROMORPHONE HCL 1 MG/ML IJ SOLN
0.5000 mg | INTRAMUSCULAR | Status: DC | PRN
Start: 2015-03-20 — End: 2015-03-20

## 2015-03-20 MED ORDER — METOCLOPRAMIDE HCL 5 MG/ML IJ SOLN
INTRAMUSCULAR | Status: AC
Start: 2015-03-20 — End: 2015-03-20
  Filled 2015-03-20: qty 2

## 2015-03-20 MED ORDER — ALBUTEROL SULFATE HFA 108 (90 BASE) MCG/ACT IN AERS
INHALATION_SPRAY | RESPIRATORY_TRACT | Status: AC
  Filled 2015-03-20: qty 6.7

## 2015-03-20 MED ORDER — CIPROFLOXACIN IN D5W 400 MG/200ML IV SOLN
INTRAVENOUS | Status: AC
  Filled 2015-03-20: qty 200

## 2015-03-20 MED ORDER — ONDANSETRON HCL 4 MG/2ML IJ SOLN
INTRAMUSCULAR | Status: DC | PRN
  Administered 2015-03-20: 4 mg via INTRAVENOUS

## 2015-03-20 MED ORDER — ONDANSETRON HCL 4 MG/2ML IJ SOLN
INTRAMUSCULAR | Status: AC
Start: 2015-03-20 — End: 2015-03-20
  Filled 2015-03-20: qty 2

## 2015-03-20 MED ORDER — LACTATED RINGERS IV SOLN
INTRAVENOUS | Status: DC | PRN
  Administered 2015-03-20: 09:00:00 via INTRAVENOUS

## 2015-03-20 MED ORDER — LIDOCAINE HCL (CARDIAC) 20 MG/ML IV SOLN
INTRAVENOUS | Status: AC
  Filled 2015-03-20: qty 5

## 2015-03-20 MED ORDER — IOHEXOL 300 MG/ML  SOLN
INTRAMUSCULAR | Status: DC | PRN
  Administered 2015-03-20: 10 mL

## 2015-03-20 MED ORDER — EPHEDRINE SULFATE 50 MG/ML IJ SOLN
INTRAMUSCULAR | Status: AC
  Filled 2015-03-20: qty 1

## 2015-03-20 MED ORDER — METOPROLOL TARTRATE 12.5 MG HALF TABLET
12.5000 mg | ORAL_TABLET | Freq: Two times a day (BID) | ORAL | Status: DC
  Administered 2015-03-20: 12.5 mg via ORAL
  Filled 2015-03-20 (×4): qty 1

## 2015-03-20 MED ORDER — SODIUM CHLORIDE 0.9 % IR SOLN
Status: DC | PRN
  Administered 2015-03-20: 3000 mL
  Administered 2015-03-20: 1000 mL

## 2015-03-20 MED ORDER — PHENYLEPHRINE 40 MCG/ML (10ML) SYRINGE FOR IV PUSH (FOR BLOOD PRESSURE SUPPORT)
PREFILLED_SYRINGE | INTRAVENOUS | Status: AC
  Filled 2015-03-20: qty 10

## 2015-03-20 MED ORDER — ONDANSETRON HCL 4 MG/2ML IJ SOLN: 4.0000 mg | Freq: Once | INTRAMUSCULAR | Status: DC | PRN

## 2015-03-20 MED ORDER — MIDAZOLAM HCL 5 MG/5ML IJ SOLN
INTRAMUSCULAR | Status: DC | PRN
  Administered 2015-03-20: 2 mg via INTRAVENOUS

## 2015-03-20 MED ORDER — EPHEDRINE SULFATE 50 MG/ML IJ SOLN
INTRAMUSCULAR | Status: DC | PRN
  Administered 2015-03-20: 10 mg via INTRAVENOUS
  Administered 2015-03-20: 15 mg via INTRAVENOUS
  Administered 2015-03-20: 10 mg via INTRAVENOUS

## 2015-03-20 MED ORDER — CIPROFLOXACIN IN D5W 400 MG/200ML IV SOLN
400.0000 mg | INTRAVENOUS | Status: AC
  Administered 2015-03-20: 400 mg via INTRAVENOUS

## 2015-03-20 MED ORDER — ALBUTEROL SULFATE HFA 108 (90 BASE) MCG/ACT IN AERS
INHALATION_SPRAY | RESPIRATORY_TRACT | Status: DC | PRN
  Administered 2015-03-20: 5 via RESPIRATORY_TRACT

## 2015-03-20 MED ORDER — FENTANYL CITRATE (PF) 100 MCG/2ML IJ SOLN
INTRAMUSCULAR | Status: DC | PRN
  Administered 2015-03-20 (×3): 50 ug via INTRAVENOUS

## 2015-03-20 MED ORDER — 0.9 % SODIUM CHLORIDE (POUR BTL) OPTIME
TOPICAL | Status: DC | PRN
  Administered 2015-03-20: 1000 mL

## 2015-03-20 MED ORDER — SUCCINYLCHOLINE CHLORIDE 20 MG/ML IJ SOLN
INTRAMUSCULAR | Status: DC | PRN
  Administered 2015-03-20: 100 mg via INTRAVENOUS

## 2015-03-20 MED ORDER — PROPOFOL 10 MG/ML IV BOLUS
INTRAVENOUS | Status: DC | PRN
  Administered 2015-03-20: 200 mg via INTRAVENOUS

## 2015-03-20 MED ORDER — GLYCOPYRROLATE 0.2 MG/ML IJ SOLN
INTRAMUSCULAR | Status: DC | PRN
  Administered 2015-03-20 (×2): 0.1 mg via INTRAVENOUS

## 2015-03-20 MED ORDER — PHENAZOPYRIDINE HCL 200 MG PO TABS: 200.0000 mg | ORAL_TABLET | Freq: Three times a day (TID) | ORAL | Status: DC | PRN

## 2015-03-20 MED ORDER — PHENYLEPHRINE HCL 10 MG/ML IJ SOLN
INTRAMUSCULAR | Status: DC | PRN
  Administered 2015-03-20 (×2): 80 ug via INTRAVENOUS
  Administered 2015-03-20: 120 ug via INTRAVENOUS

## 2015-03-20 MED ORDER — FENTANYL CITRATE (PF) 250 MCG/5ML IJ SOLN
INTRAMUSCULAR | Status: AC
  Filled 2015-03-20: qty 5

## 2015-03-20 SURGICAL SUPPLY — 14 items
BAG URO CATCHER STRL LF (DRAPE) ×3 IMPLANT
CATH URET 5FR 28IN OPEN ENDED (CATHETERS) ×3 IMPLANT
CLOTH BEACON ORANGE TIMEOUT ST (SAFETY) ×3 IMPLANT
EXTRACTOR STONE NITINOL NGAGE (UROLOGICAL SUPPLIES) ×3 IMPLANT
FIBER LASER TRAC TIP (UROLOGICAL SUPPLIES) ×3 IMPLANT
GLOVE SURG SS PI 8.0 STRL IVOR (GLOVE) ×3 IMPLANT
GOWN STRL REUS W/TWL XL LVL3 (GOWN DISPOSABLE) ×3 IMPLANT
GUIDEWIRE STR DUAL SENSOR (WIRE) ×3 IMPLANT
MANIFOLD NEPTUNE II (INSTRUMENTS) ×3 IMPLANT
PACK CYSTO (CUSTOM PROCEDURE TRAY) ×3 IMPLANT
SHEATH ACCESS URETERAL 38CM (SHEATH) IMPLANT
STENT URET 6FRX24 CONTOUR (STENTS) ×3 IMPLANT
TUBING CONNECTING 10 (TUBING) ×2 IMPLANT
TUBING CONNECTING 10' (TUBING) ×1

## 2015-03-20 NOTE — Anesthesia Procedure Notes (Signed)
Procedure Name: Intubation Date/Time: 03/20/2015 10:05 AM Performed by: Ludwig LeanJONES, Cassandria Drew C Pre-anesthesia Checklist: Patient identified, Emergency Drugs available, Suction available and Patient being monitored Patient Re-evaluated:Patient Re-evaluated prior to inductionOxygen Delivery Method: Circle System Utilized Preoxygenation: Pre-oxygenation with 100% oxygen Intubation Type: IV induction Ventilation: Mask ventilation without difficulty Laryngoscope Size: Mac and 3 Grade View: Grade I Tube type: Oral Tube size: 7.5 mm Number of attempts: 1 Airway Equipment and Method: Oral airway Placement Confirmation: ETT inserted through vocal cords under direct vision,  positive ETCO2 and breath sounds checked- equal and bilateral Secured at: 21 cm Tube secured with: Tape Dental Injury: Teeth and Oropharynx as per pre-operative assessment

## 2015-03-20 NOTE — Discharge Instructions (Signed)
CYSTOSCOPY HOME CARE INSTRUCTIONS  Activity: Rest for the remainder of the day.  Do not drive or operate equipment today.  You may resume normal activities in one to two days as instructed by your physician.   Meals: Drink plenty of liquids and eat light foods such as gelatin or soup this evening.  You may return to a normal meal plan tomorrow.  Return to Work: You may return to work in one to two days or as instructed by your physician.  Special Instructions / Symptoms: Call your physician if any of these symptoms occur:   -persistent or heavy bleeding  -bleeding which continues after first few urination  -large blood clots that are difficult to pass  -urine stream diminishes or stops completely  -fever equal to or higher than 101 degrees Farenheit.  -cloudy urine with a strong, foul odor  -severe pain  Females should always wipe from front to back after elimination.  You may feel some burning pain when you urinate.  This should disappear with time.  Applying moist heat to the lower abdomen or a hot tub bath may help relieve the pain. \  A ureteral stent was left in after the procedure and it may cause you to have urinary symptoms like frequency and urgency and you may have some pain in your side when you urinate.   Please remove the stent by pulling the string at the urethral opening.  If you don't feel comfortable with that, you may come to the office on Friday morning to have that done.   Call if you plan to come.   Patient Signature:  ________________________________________________________  Nurse's Signature:  ________________________________________________________

## 2015-03-20 NOTE — Interval H&P Note (Signed)
History and Physical Interval Note:  The initial stent attempt was unsuccessful but she has done ok on antibiotic coverage with only low grade fever.  Another attempt at stent placement with possible ureteroscopy will be made today.   The need for ongoing Brilinta limits our options.   03/20/2015 8:18 AM  Tanya Lowe  has presented today for surgery, with the diagnosis of LEFT URETERAL STONE  The various methods of treatment have been discussed with the patient and family. After consideration of risks, benefits and other options for treatment, the patient has consented to  Procedure(s): CYSTOSCOPY WITH LEFT  RETROGRADE LEFT URETEROSCOPY AND LEFT  STENT  (Left) as a surgical intervention .  The patient's history has been reviewed, patient examined, no change in status, stable for surgery.  I have reviewed the patient's chart and labs.  Questions were answered to the patient's satisfaction.     Dorr Perrot J

## 2015-03-20 NOTE — Transfer of Care (Signed)
Immediate Anesthesia Transfer of Care Note  Patient: Tanya Lowe  Procedure(s) Performed: Procedure(s): CYSTOSCOPY WITH LEFT  RETROGRADE LEFT URETEROSCOPY AND LEFT  STENT  (Left) HOLMIUM LASER APPLICATION (Left)  Patient Location: PACU  Anesthesia Type:General  Level of Consciousness: Patient easily awoken, sedated, comfortable, cooperative, following commands, responds to stimulation.   Airway & Oxygen Therapy: Patient spontaneously breathing, ventilating well, oxygen via simple oxygen mask.  Post-op Assessment: Report given to PACU RN, vital signs reviewed and stable, moving all extremities.   Post vital signs: Reviewed and stable.  Complications: No apparent anesthesia complications

## 2015-03-20 NOTE — Brief Op Note (Signed)
03/20/2015  10:31 AM  PATIENT:  Tanya Lowe  44 y.o. female  PRE-OPERATIVE DIAGNOSIS:  LEFT URETERAL STONE  POST-OPERATIVE DIAGNOSIS:  LEFT URETERAL STONE  PROCEDURE:  Procedure(s): CYSTOSCOPY WITH LEFT  RETROGRADE LEFT URETEROSCOPY AND LEFT  STENT  (Left) HOLMIUM LASER APPLICATION (Left)  SURGEON:  Surgeon(s) and Role:    * Bjorn PippinJohn Aura Bibby, MD - Primary  PHYSICIAN ASSISTANT:   ASSISTANTS: none   ANESTHESIA:   general  EBL:     BLOOD ADMINISTERED:none  DRAINS: 6 x 24 stent left   LOCAL MEDICATIONS USED:  NONE  SPECIMEN:  Source of Specimen:  ureteral stone  DISPOSITION OF SPECIMEN:  given to family  COUNTS:  YES  TOURNIQUET:  * No tourniquets in log *  DICTATION: .Other Dictation: Dictation Number [id number]  PLAN OF CARE: Discharge to home after PACU  PATIENT DISPOSITION:  PACU - hemodynamically stable.   Delay start of Pharmacological VTE agent (>24hrs) due to surgical blood loss or risk of bleeding: not applicable

## 2015-03-20 NOTE — Anesthesia Postprocedure Evaluation (Signed)
  Anesthesia Post-op Note  Patient: Tanya Lowe  Procedure(s) Performed: Procedure(s): CYSTOSCOPY WITH LEFT  RETROGRADE LEFT URETEROSCOPY AND LEFT  STENT  (Left) HOLMIUM LASER APPLICATION (Left)  Patient Location: PACU  Anesthesia Type:General  Level of Consciousness: awake, alert , oriented and patient cooperative  Airway and Oxygen Therapy: Patient Spontanous Breathing  Post-op Pain: mild  Post-op Assessment: Post-op Vital signs reviewed, Patient's Cardiovascular Status Stable, Respiratory Function Stable, Patent Airway, No signs of Nausea or vomiting and Pain level controlled  Post-op Vital Signs: stable  Last Vitals:  Filed Vitals:   03/20/15 1200  BP: 109/75  Pulse: 81  Temp:   Resp: 20    Complications: No apparent anesthesia complications

## 2015-03-20 NOTE — H&P (View-Only) (Signed)
History of Present Illness   Tanya Lowe followed up today for her left stone. Since Sunday, she has left flank pain, sometimes associated with nausea and vomiting, but no hematuria. She describes a low-grade temperature that was normal when seen at [facility] yesterday. The plan was to see her this morning in the office, but they would go to the emergency room if she developed fever. I have spoken to the emergency room physician, and she actually looked excellent while she was at [facility] and wanted to go home. She was started on an oral antibiotic, Septra, and her urine was sent for culture. She has Percocet at home which controls her pain, and this is for her lower back.  At baseline, she can leak with coughing and sneezing and wears 1 pad a day. She voids every 2-3 hours and gets up once at night.  She has had lumbar disc surgery. She has not had a history of a hysterectomy. Her bowel function is normal.  Apparently, she went to [facility] last night because she did develop a temperature of 102. I am not sure the mechanism, but she was seen this morning as my first patient.  There are no other modifying factors or associated signs or symptoms. There are no other aggravating or relieving factors. The presentation is moderate in severity and ongoing.      I was able to locate notes from [facility]. She came in complaining of fevers and worsening nausea with vomiting. She has a history of fibromyalgia as well. She is on a number of medications listed. A renal ultrasound was performed. Right kidney was normal. She has mild left-sided caliectasis similar to the CT scan earlier. She was actually afebrile. Her serum creatinine was 0.78, and her urinalysis was negative. Actually, she did have some bacteria in the urine on microscopic analysis. White blood count was 10.1. Her pain settled with 2 administrations of IV morphine, and she was discharged. Repeat lab tests were stable.        Surgical History Problems   1. History of Back Surgery  2. History of Cath Stent Placement  Current Meds  1. Aspirin 81 MG TABS;  Therapy: (Recorded:25May2016) to Recorded  2. Atorvastatin Calcium TABS;  Therapy: (Recorded:25May2016) to Recorded  3. Brilinta TABS;  Therapy: (Recorded:25May2016) to Recorded  4. Metoprolol Tartrate TABS;  Therapy: (Recorded:25May2016) to Recorded  5. Percocet TABS;  Therapy: (Recorded:25May2016) to Recorded  6. PriLOSEC CPDR;  Therapy: (Recorded:25May2016) to Recorded  Allergies Medication   1. predniSONE  Family History Problems   1. Family history of rheumatoid arthritis (Z82.61) : Mother  Social History Problems    Denied: History of Alcohol use   Daily caffeine consumption   2 per day   Divorced   Two children   daughters  Review of Systems Genitourinary, skin, eye, otolaryngeal, hematologic/lymphatic, cardiovascular, pulmonary, endocrine, gastrointestinal, neurological and psychiatric system(s) were reviewed and pertinent findings if present are noted and are otherwise negative.  Constitutional: night sweats, but no fever.  Musculoskeletal: back pain and joint pain.    Vitals Vital Signs [Data Includes: Last 1 Day]  Recorded: 25May2016 08:40AM  Height: 5 ft 2.5 in Weight: 199 lb  BMI Calculated: 35.82 BSA Calculated: 1.92 Blood Pressure: 113 / 78, Sitting Temperature: 99.5 F, Oral Heart Rate: 116 Respiration: 18  Physical Exam Constitutional: Well nourished and well developed . No acute distress.   ENT:. The ears and nose are normal in appearance.   Neck: The  appearance of the neck is normal and no neck mass is present.   Pulmonary: No respiratory distress and normal respiratory rhythm and effort.   Cardiovascular: Heart rate and rhythm are normal . No peripheral edema.   Lymphatics: The femoral and inguinal nodes are not enlarged or tender.   Skin: Normal skin turgor, no visible rash and no visible skin  lesions.   Neuro/Psych:. Mood and affect are appropriate.   . Abdomen:    No mass or tenderness.    . Genitourinary:    Tanya Lowe looks a little bit uncomfortable. She has minimal to no left CVA tenderness or abdominal tenderness. She is afebrile.      Results/Data Urine [Data Includes: Last 1 Day]   25May2016  COLOR AMBER   APPEARANCE CLEAR   SPECIFIC GRAVITY 1.025   pH 5.5   GLUCOSE NEG mg/dL  BILIRUBIN NEG   KETONE NEG mg/dL  BLOOD LARGE   PROTEIN 100 mg/dL  UROBILINOGEN 1 mg/dL  NITRITE NEG   LEUKOCYTE ESTERASE TRACE   SQUAMOUS EPITHELIAL/HPF FEW   WBC 21-50 WBC/hpf  RBC 7-10 RBC/hpf  BACTERIA MODERATE   CRYSTALS NONE SEEN   CASTS NONE SEEN    Assessment Assessed   1. Nephrolithiasis (N20.0)  2. Female stress incontinence (N39.3)  Plan Health Maintenance   1. UA With REFLEX; [Do Not Release]; Status:Resulted - Requires Verification;   Done:  25May2016 08:29AM Nephrolithiasis   2. KUB; Status:Resulted - Requires Verification;   Done: 25May2016 09:46AM  Discussion/Summary   Tanya Lowe has ongoing left-sided colic. At home, she reports a fever, though she has been afebrile in the emergency room and here in the office. I think we have to assume that she has an infected stone.  After a thorough review of the management options for the patient's condition the patient  elected to proceed with surgical therapy as noted above. We have discussed the potential benefits and risks of the procedure, side effects of the proposed treatment, the likelihood of the patient achieving the goals of the procedure, and any potential problems that might occur during the procedure or recuperation. Informed consent has been obtained.

## 2015-03-20 NOTE — OR Nursing (Signed)
Left ureteral stone sent with Dr. Annabell HowellsWrenn.

## 2015-03-20 NOTE — Anesthesia Preprocedure Evaluation (Signed)
Anesthesia Evaluation  Patient identified by MRN, date of birth, ID band Patient awake    Reviewed: Allergy & Precautions, NPO status , Patient's Chart, lab work & pertinent test results  Airway Mallampati: I       Dental   Pulmonary Current Smoker,    Pulmonary exam normal       Cardiovascular hypertension, + Past MI Normal cardiovascular exam    Neuro/Psych  Neuromuscular disease    GI/Hepatic   Endo/Other    Renal/GU Renal disease     Musculoskeletal  (+) Arthritis -, Fibromyalgia -  Abdominal   Peds  Hematology   Anesthesia Other Findings   Reproductive/Obstetrics                             Anesthesia Physical Anesthesia Plan  ASA: III  Anesthesia Plan: General   Post-op Pain Management:    Induction: Intravenous  Airway Management Planned: Oral ETT and LMA  Additional Equipment:   Intra-op Plan:   Post-operative Plan: Extubation in OR  Informed Consent: I have reviewed the patients History and Physical, chart, labs and discussed the procedure including the risks, benefits and alternatives for the proposed anesthesia with the patient or authorized representative who has indicated his/her understanding and acceptance.     Plan Discussed with: CRNA, Anesthesiologist and Surgeon  Anesthesia Plan Comments:         Anesthesia Quick Evaluation

## 2015-03-20 NOTE — Progress Notes (Signed)
Late entry for 1145

## 2015-03-20 NOTE — Addendum Note (Signed)
Addendum  created 03/20/15 1259 by Ludwig Leanachel C Janvi Ammar, CRNA   Modules edited: Anesthesia Attestations

## 2015-03-21 ENCOUNTER — Encounter (HOSPITAL_COMMUNITY): Payer: Self-pay | Admitting: Urology

## 2015-03-21 NOTE — Op Note (Signed)
Tanya Lowe, Tanya Lowe                  ACCOUNT NO.:  [id number]  MEDICAL RECORD NO.:  [id number]  LOCATION:  [facility]                         FACILITY:  [facility]  PHYSICIAN:  Excell Seltzer. Tanya Lowe, M.D.    DATE OF BIRTH:  1971-03-16  DATE OF PROCEDURE:  03/20/2015 DATE OF DISCHARGE:  03/20/2015                              OPERATIVE REPORT   PROCEDURES: 1. Cystoscopy with left retrograde pyelogram and interpretation. 2. Left ureteroscopy with holmium lasertripsy and placement of left     double-J stent.  PREOPERATIVE DIAGNOSIS:  Left distal ureteral stone with infection.  POSTOPERATIVE DIAGNOSIS:  Left distal ureteral stone with infection.  SURGEON:  Excell Seltzer. Tanya Lowe, M.D.  ANESTHESIA:  General.  SPECIMEN:  Stone fragments.  DRAINS:  A 6-French 24-m double-J stent.  COMPLICATIONS:  None.  INDICATIONS:  Ms. Tanya Lowe is a 44 year old white female, who was seen last week by Dr. Sherron Monday for 2-mm left distal ureteral stone with infection.  She had minimal obstruction.  On initial attempt, the stent placement was unsuccessful due to the impacted nature of the stone.  She had been kept on antibiotics of the weekend, had only low-grade fever, but persistent pain, and I am going to take her to the operating room today to make a second attempt at removing/bypassing of the stone.  FINDINGS OF PROCEDURE:  She was taken to the operating room where she was given Cipro.  General anesthetic was induced.  She was placed in lithotomy position.  Her perineum and genitalia were prepped with Betadine solution and she was draped in usual sterile fashion.  She had been fitted with PAS hose.  Cystoscopy was performed using a 22-French scope and 30-degree lens.  Examination revealed a normal urethra.  The trigone had some squamous metaplasia, but the ureteral orifices were unremarkable.  The bladder wall was smooth and pale without tumor, stones, or inflammation.  The left ureteral orifice was cannulated with a  5-French open-end catheter and contrast was instilled.  This revealed a filling defect in the distal ureter consistent with the stone.  There was no proximal obstruction, but I did go by the stone.  A Sensor guidewire was then passed through the open-end catheter to the level of stone, but it would not pass easily and I did not force it.  The 4.5-French semi-rigid ureteroscope was then assembled and inserted. I was able to negotiate up the ureteral orifice without difficulty to the level of the stone.  There was some narrowing distal to the stone.  The stone was then engaged with a 220 microns tracked up holmium laser fiber at 0.5 watts and 10 hertz, and broken into manageable fragments.  The fragments were then removed without difficulty using a NGage basket. Once all the fragments had been removed, the ureteroscope was removed, the cystoscope was replaced and a guidewire was reinserted to the kidney without difficulty under fluoroscopic guidance.  A 6-French 24-cm double-J stent with string was then passed over the wire to the kidney.  The wire was removed leaving good coil in the kidney, good coil in the bladder.  The bladder was then drained.  The cystoscope was removed leaving the stent  string exiting the urethra. The string was tied, close to the meatus, trimmed and then tucked in a tampon string fashion.  At this point, the patient was taken down from lithotomy position, her anesthetic was reversed, she was moved to the recovery room in stable condition.  There were no complications.     Excell SeltzerJohn J. Tanya HowellsWrenn, M.D.     JJW/MEDQ  D:  03/20/2015  T:  03/21/2015  Job:  [id number]

## 2015-05-11 ENCOUNTER — Ambulatory Visit (INDEPENDENT_AMBULATORY_CARE_PROVIDER_SITE_OTHER): Payer: Self-pay | Admitting: Cardiology

## 2015-05-11 ENCOUNTER — Encounter: Payer: Self-pay | Admitting: Cardiology

## 2015-05-11 VITALS — BP 122/78 | HR 83 | Ht 62.0 in | Wt 201.6 lb

## 2015-05-11 DIAGNOSIS — I25810 Atherosclerosis of coronary artery bypass graft(s) without angina pectoris: Secondary | ICD-10-CM

## 2015-05-11 MED ORDER — LISINOPRIL 2.5 MG PO TABS: 2.5000 mg | ORAL_TABLET | Freq: Every day | ORAL | Status: DC

## 2015-05-11 NOTE — Progress Notes (Signed)
Patient ID: Tanya Lowe, female   DOB: 20-Apr-1971, 44 y.o.   MRN: [id number]     Clinical Summary Tanya Lowe is a 44 y.o.female seen today for follow up of the following medical problems.   1. CAD - history of NSTEMI, s/p DES to mid RCA and DES to distal RCA and DES to mid LAD.  - no echo in system, LVEF by LVgram 55-60%.  - denies any chest pain. No SOB or DOE - compliant with meds  Past Medical History  Diagnosis Date  . Hypertension   . Arthritis   . Fibromyalgia   . Kidney stones     hx of w previous lithotripsy     Allergies  Allergen Reactions  . Prednisone Hypertension    Tachycardia      Current Outpatient Prescriptions  Medication Sig Dispense Refill  . acetaminophen (TYLENOL) 500 MG tablet Take 1,000 mg by mouth every 6 (six) hours as needed for mild pain.     Marland Kitchen atorvastatin (LIPITOR) 80 MG tablet Take 1 tablet (80 mg total) by mouth daily at 6 PM. 30 tablet 11  . DULoxetine (CYMBALTA) 60 MG capsule Take 60 mg by mouth daily.    Marland Kitchen gabapentin (NEURONTIN) 600 MG tablet Take 1,200 mg by mouth 3 (three) times daily.    . metoprolol tartrate (LOPRESSOR) 25 MG tablet Take 0.5 tablets (12.5 mg total) by mouth 2 (two) times daily. 60 tablet 5  . nitroGLYCERIN (NITROSTAT) 0.4 MG SL tablet Place 1 tablet (0.4 mg total) under the tongue every 5 (five) minutes as needed for chest pain. 25 tablet 12  . omeprazole (PRILOSEC) 20 MG capsule Take 20 mg by mouth daily.    Marland Kitchen oxyCODONE-acetaminophen (PERCOCET) 10-325 MG per tablet Take 1 tablet by mouth 3 (three) times daily as needed for pain.    . phenazopyridine (PYRIDIUM) 200 MG tablet Take 1 tablet (200 mg total) by mouth 3 (three) times daily as needed for pain. 15 tablet 0  . ticagrelor (BRILINTA) 90 MG TABS tablet Take 1 tablet (90 mg total) by mouth 2 (two) times daily. 60 tablet 10  . tiZANidine (ZANAFLEX) 4 MG tablet Take 4 mg by mouth every 8 (eight) hours as needed for muscle spasms.      No current  facility-administered medications for this visit.     Past Surgical History  Procedure Laterality Date  . Spinal fusion      L5-S1  . Tubal ligation    . Left heart catheterization with coronary angiogram N/A 10/25/2014    Procedure: LEFT HEART CATHETERIZATION WITH CORONARY ANGIOGRAM;  Surgeon: Kathleene Hazel, MD;  Location: [facility] CATH LAB;  Service: Cardiovascular;  Laterality: N/A;  . Lithotripsy  about 2011  . Cystoscopy with retrograde pyelogram, ureteroscopy and stent placement Left 03/14/2015    Procedure: CYSTOSCOPY WITH LEFT RETROGRADE PYELOGRAM;  Surgeon: Alfredo Martinez, MD;  Location: [facility];  Service: Urology;  Laterality: Left;  . Cystoscopy with retrograde pyelogram, ureteroscopy and stent placement Left 03/20/2015    Procedure: CYSTOSCOPY WITH LEFT  RETROGRADE LEFT URETEROSCOPY AND LEFT  STENT ;  Surgeon: Bjorn Pippin, MD;  Location: [facility];  Service: Urology;  Laterality: Left;  . Holmium laser application Left 03/20/2015    Procedure: HOLMIUM LASER APPLICATION;  Surgeon: Bjorn Pippin, MD;  Location: [facility];  Service: Urology;  Laterality: Left;     Allergies  Allergen Reactions  . Prednisone Hypertension    Tachycardia       Family History  Problem Relation Age of Onset  . Diabetes Mother   . Stroke Mother   . Seizures Other   . Cancer Other   . Diabetes Other      Social History Tanya Lowe reports that she has been smoking Cigarettes.  She started smoking about 24 years ago. She has a 6 pack-year smoking history. She has never used smokeless tobacco. Tanya Lowe reports that she does not drink alcohol.   Review of Systems CONSTITUTIONAL: No weight loss, fever, chills, weakness or fatigue.  HEENT: Eyes: No visual loss, blurred vision, double vision or yellow sclerae.No hearing loss, sneezing, congestion, runny nose or sore throat.  SKIN: No rash or itching.  CARDIOVASCULAR: per HPI RESPIRATORY: No shortness of breath, cough or sputum.  GASTROINTESTINAL: No  anorexia, nausea, vomiting or diarrhea. No abdominal pain or blood.  GENITOURINARY: No burning on urination, no polyuria NEUROLOGICAL: No headache, dizziness, syncope, paralysis, ataxia, numbness or tingling in the extremities. No change in bowel or bladder control.  MUSCULOSKELETAL: No muscle, back pain, joint pain or stiffness.  LYMPHATICS: No enlarged nodes. No history of splenectomy.  PSYCHIATRIC: No history of depression or anxiety.  ENDOCRINOLOGIC: No reports of sweating, cold or heat intolerance. No polyuria or polydipsia.  Marland Kitchen   Physical Examination Filed Vitals:   05/11/15 0900  BP: 122/78  Pulse: 83   Filed Vitals:   05/11/15 0900  Height: 5\' 2"  (1.575 m)  Weight: 201 lb 9.6 oz (91.445 kg)    Gen: resting comfortably, no acute distress HEENT: no scleral icterus, pupils equal round and reactive, no palptable cervical adenopathy,  CV: RRR, no m/r/g, no JVD Resp: Clear to auscultation bilaterally GI: abdomen is soft, non-tender, non-distended, normal bowel sounds, no hepatosplenomegaly MSK: extremities are warm, no edema.  Skin: warm, no rash Neuro:  no focal deficits Psych: appropriate affect   Diagnostic Studies Jan 2016 Cath PCI Note: She was given an additional 5000 units IV heparin x 1. ACT was over 200. She was given Brilinta 180 mg po x 1.   Lesion # 1 Distal RCA: JR4 guiding catheter used to engage the RCA. Cougar IC wire down the RCA. 2.0 x 15 mm balloon x 1 for pre-dilatation. 2.25 x 16 mm Promus Premier DES x 1 distal RCA post-dilated with a 2.5 x 12 mm [place] balloon x 1. Stenosis taken from 90% down to 0%.   Lesion # 2 mid RCA: 2.0 x 15 mm balloon x 1. 3.0 x 12 mm Promus Premier DES x 1. The mid stenosis was followed by an aneurysmal segment with a size mismatch before the distal vessel so stents were not overlapped. Post-dilatation with a 3.5 x 8 mm [place] balloon x 1. Stenosis taken from 95% down to 0%.   Lesion #3 mid LAD: XB LAD 3.5 guiding catheter to engage  left main. 2.5 x 12 mm balloon x 1 for pre-dilatation. 3.5 x 16 mm Promus Premier DES x 1 mid LAD. Stent post-dilated with a 3.5 x 8 mm [place] balloon x 2. Stenosis taken from 80% down to 0%.  The sheath was removed from the right radial artery and a Terumo hemostasis band was applied at the arteriotomy site on the right wrist. There were no immediate complications. The patient was taken to the recovery area in stable condition.   Hemodynamic Findings: Central aortic pressure: 115/66 Left ventricular pressure: 121/3/14  Angiographic Findings:  Left main: No obstructive disease.   Left Anterior Descending Artery: Large caliber vessel that courses to  the apex. 80% mid stenosis. Small caliber diagonal [place]. The distal LAD tapers to a small caliber vessel.   Circumflex Artery: Large caliber vessel with 20% proximal stenosis.   Right Coronary Artery: Large dominant vessel with diffuse 30% proximal stenosis, 95% mid stenosis followed by an aneurysmal segment. Distal 90% stenosis. Diffuse mild plaque in the posterolateral artery and PDA.   Left Ventricular Angiogram: LVEF=55-60%.   Impression: 1. Severe double vessel CAD 2. NSTEMI 3. Normal LV systolic function 4. Successful PTCA/DES x 1 mid RCA and PTCA/DES x 1 distal RCA (stents not overlapped due to size mismatch in the vessel and the presence of an aneurysmal segment in the distal segment of the mid vessel) 5. Successful PTCA/DES x 1 mid LAD  Recommendations: Will need dual anti-platelet therapy with ASA and Brilinta for at least one year. Continue beta blocker and statin. Tobacco cessation.    ??echo      Assessment and Plan  1. CAD - history of NSTEMI, s/p stenting to RCA and LAD - no current symptoms - has not had echo in setting of her CAD, will order - start lisinopril 2.5mg  daily in setting of known CAD.   F/u Jan 2017  Antoine Poche, M.D.

## 2015-05-11 NOTE — Patient Instructions (Signed)
Your physician wants you to follow-up in:  January 2017 WITH DR. BRANCH You will receive a reminder letter in the mail two months in advance. If you don't receive a letter, please call our office to schedule the follow-up appointment.  Your physician has requested that you have an echocardiogram. Echocardiography is a painless test that uses sound waves to create images of your heart. It provides your doctor with information about the size and shape of your heart and how well your heart's chambers and valves are working. This procedure takes approximately one hour. There are no restrictions for this procedure.  START LISINOPRIL 2.5 MG DAILY   Thanks for choosing [facility]!!!

## 2015-05-14 ENCOUNTER — Ambulatory Visit (HOSPITAL_COMMUNITY)
Admission: RE | Admit: 2015-05-14 | Discharge: 2015-05-14 | Disposition: A | Discharge status: Home / Self Care | Payer: Self-pay | Source: Ambulatory Visit | Attending: Cardiology | Admitting: Cardiology

## 2015-05-14 DIAGNOSIS — I2510 Atherosclerotic heart disease of native coronary artery without angina pectoris: Secondary | ICD-10-CM | POA: Insufficient documentation

## 2015-05-14 DIAGNOSIS — I25810 Atherosclerosis of coronary artery bypass graft(s) without angina pectoris: Secondary | ICD-10-CM

## 2015-07-30 ENCOUNTER — Emergency Department (HOSPITAL_COMMUNITY)
Admission: EM | Admit: 2015-07-30 | Discharge: 2015-07-30 | Disposition: A | Discharge status: Home / Self Care | Payer: Self-pay | Attending: Emergency Medicine | Admitting: Emergency Medicine

## 2015-07-30 ENCOUNTER — Emergency Department (HOSPITAL_COMMUNITY): Payer: Self-pay

## 2015-07-30 ENCOUNTER — Encounter (HOSPITAL_COMMUNITY): Payer: Self-pay | Admitting: Emergency Medicine

## 2015-07-30 DIAGNOSIS — Z3202 Encounter for pregnancy test, result negative: Secondary | ICD-10-CM | POA: Insufficient documentation

## 2015-07-30 DIAGNOSIS — R0789 Other chest pain: Secondary | ICD-10-CM | POA: Insufficient documentation

## 2015-07-30 DIAGNOSIS — Z720 Tobacco use: Secondary | ICD-10-CM | POA: Insufficient documentation

## 2015-07-30 DIAGNOSIS — M1990 Unspecified osteoarthritis, unspecified site: Secondary | ICD-10-CM | POA: Insufficient documentation

## 2015-07-30 DIAGNOSIS — I10 Essential (primary) hypertension: Secondary | ICD-10-CM | POA: Insufficient documentation

## 2015-07-30 DIAGNOSIS — M797 Fibromyalgia: Secondary | ICD-10-CM | POA: Insufficient documentation

## 2015-07-30 DIAGNOSIS — I252 Old myocardial infarction: Secondary | ICD-10-CM | POA: Insufficient documentation

## 2015-07-30 DIAGNOSIS — Z7902 Long term (current) use of antithrombotics/antiplatelets: Secondary | ICD-10-CM | POA: Insufficient documentation

## 2015-07-30 DIAGNOSIS — Z9889 Other specified postprocedural states: Secondary | ICD-10-CM | POA: Insufficient documentation

## 2015-07-30 DIAGNOSIS — Z79899 Other long term (current) drug therapy: Secondary | ICD-10-CM | POA: Insufficient documentation

## 2015-07-30 DIAGNOSIS — G8929 Other chronic pain: Secondary | ICD-10-CM | POA: Insufficient documentation

## 2015-07-30 DIAGNOSIS — Z87442 Personal history of urinary calculi: Secondary | ICD-10-CM | POA: Insufficient documentation

## 2015-07-30 DIAGNOSIS — M545 Low back pain: Secondary | ICD-10-CM | POA: Insufficient documentation

## 2015-07-30 HISTORY — DX: Pain in thoracic spine: M54.6

## 2015-07-30 HISTORY — DX: Cervicalgia: M54.2

## 2015-07-30 HISTORY — DX: Other chronic pain: G89.29

## 2015-07-30 HISTORY — DX: Radiculopathy, cervical region: M54.12

## 2015-07-30 HISTORY — DX: Pain, unspecified: R52

## 2015-07-30 HISTORY — DX: Cervicalgia: M54.9

## 2015-07-30 HISTORY — DX: Pain in right hand: M79.641

## 2015-07-30 HISTORY — DX: Acute myocardial infarction, unspecified: I21.9

## 2015-07-30 HISTORY — DX: Pain in right hand: M79.642

## 2015-07-30 LAB — BASIC METABOLIC PANEL
Anion gap: 7 (ref 5–15)
BUN: 9 mg/dL (ref 6–20)
CALCIUM: 8.6 mg/dL — AB (ref 8.9–10.3)
CHLORIDE: 106 mmol/L (ref 101–111)
CO2: 27 mmol/L (ref 22–32)
CREATININE: 0.75 mg/dL (ref 0.44–1.00)
GFR calc non Af Amer: >60 mL/min (ref >60)
Glucose, Bld: 132 mg/dL — ABNORMAL HIGH (ref 65–99)
Potassium: 3.9 mmol/L (ref 3.5–5.1)
Sodium: 140 mmol/L (ref 135–145)

## 2015-07-30 LAB — URINALYSIS, ROUTINE W REFLEX MICROSCOPIC
Bilirubin Urine: NEGATIVE
Glucose, UA: NEGATIVE mg/dL
KETONES UR: NEGATIVE mg/dL
LEUKOCYTES UA: NEGATIVE
NITRITE: NEGATIVE
PROTEIN: NEGATIVE mg/dL
Specific Gravity, Urine: 1.025 (ref 1.005–1.030)
UROBILINOGEN UA: 0.2 mg/dL (ref 0.0–1.0)
pH: 6 (ref 5.0–8.0)

## 2015-07-30 LAB — CBC
HCT: 40.7 % (ref 36.0–46.0)
Hemoglobin: 13.4 g/dL (ref 12.0–15.0)
MCH: 28.5 pg (ref 26.0–34.0)
MCHC: 32.9 g/dL (ref 30.0–36.0)
MCV: 86.4 fL (ref 78.0–100.0)
PLATELETS: 254 10*3/uL (ref 150–400)
RBC: 4.71 MIL/uL (ref 3.87–5.11)
RDW: 13.7 % (ref 11.5–15.5)
WBC: 8.5 10*3/uL (ref 4.0–10.5)

## 2015-07-30 LAB — TROPONIN I

## 2015-07-30 LAB — URINE MICROSCOPIC-ADD ON

## 2015-07-30 LAB — D-DIMER, QUANTITATIVE: D-Dimer, Quant: 0.46 ug/mL-FEU (ref 0.00–0.48)

## 2015-07-30 LAB — PREGNANCY, URINE: PREG TEST UR: NEGATIVE

## 2015-07-30 NOTE — ED Notes (Signed)
PT c/o left sided chest pain radiating through to her back x2 days with hx of MI in Jan 2016 with two stents. PT also c/o cold symptoms in the back week and some SOB on exertion with productive clear cough.

## 2015-07-30 NOTE — ED Provider Notes (Signed)
CSN: [id number]     Arrival date & time 07/30/15  1146 History   First MD Initiated Contact with Patient 07/30/15 1340     Chief Complaint  Patient presents with  . Chest Pain      HPI Pt was seen at 1345.  Per pt, c/o gradual onset and persistence of constant chest "pain" and back "pain" for the past 2 to 3 days. Has been associated with coughing for the past 1 week. Pt states the pain has been constant and unchanged since onset. Denies abd pain, no N/V/D, no palpitations, no SOB.    Past Medical History  Diagnosis Date  . Hypertension   . Arthritis   . Fibromyalgia   . Kidney stones     hx of w previous lithotripsy  . MI (myocardial infarction) (HCC)   . Chronic neck and back pain   . Thoracic back pain   . Cervical radiculopathy   . Bilateral hand pain   . Pain management     [facility]   Past Surgical History  Procedure Laterality Date  . Spinal fusion      L5-S1  . Tubal ligation    . Left heart catheterization with coronary angiogram N/A 10/25/2014    Procedure: LEFT HEART CATHETERIZATION WITH CORONARY ANGIOGRAM;  Surgeon: Kathleene Hazel, MD;  Location: [facility] CATH LAB;  Service: Cardiovascular;  Laterality: N/A;  . Lithotripsy  about 2011  . Cystoscopy with retrograde pyelogram, ureteroscopy and stent placement Left 03/14/2015    Procedure: CYSTOSCOPY WITH LEFT RETROGRADE PYELOGRAM;  Surgeon: Alfredo Martinez, MD;  Location: [facility];  Service: Urology;  Laterality: Left;  . Cystoscopy with retrograde pyelogram, ureteroscopy and stent placement Left 03/20/2015    Procedure: CYSTOSCOPY WITH LEFT  RETROGRADE LEFT URETEROSCOPY AND LEFT  STENT ;  Surgeon: Bjorn Pippin, MD;  Location: [facility];  Service: Urology;  Laterality: Left;  . Holmium laser application Left 03/20/2015    Procedure: HOLMIUM LASER APPLICATION;  Surgeon: Bjorn Pippin, MD;  Location: [facility];  Service: Urology;  Laterality: Left;  . Cardiac surgery     Family History  Problem Relation Age of Onset  . Diabetes  Mother   . Stroke Mother   . Seizures Other   . Cancer Other   . Diabetes Other    Social History  Substance Use Topics  . Smoking status: Current Every Day Smoker -- 0.25 packs/day for 24 years    Types: Cigarettes    Start date: 10/25/1990  . Smokeless tobacco: Never Used  . Alcohol Use: No   OB History    Gravida Para Term Preterm AB TAB SAB Ectopic Multiple Living   Review of Systems ROS: Statement: All systems negative except as marked or noted in the HPI; Constitutional: Negative for fever and chills. ; ; Eyes: Negative for eye pain, redness and discharge. ; ; ENMT: Negative for ear pain, hoarseness, nasal congestion, sinus pressure and sore throat. ; ; Cardiovascular: Negative for palpitations, diaphoresis, dyspnea and peripheral edema. ; ; Respiratory: Negative for cough, wheezing and stridor. ; ; Gastrointestinal: Negative for nausea, vomiting, diarrhea, abdominal pain, blood in stool, hematemesis, jaundice and rectal bleeding. . ; ; Genitourinary: Negative for dysuria, flank pain and hematuria. ; ; Musculoskeletal: +CP, back pain. Negative for neck pain. Negative for swelling and trauma.; ; Skin: Negative for pruritus, rash, abrasions, blisters, bruising and skin lesion.; ; Neuro: Negative for headache, lightheadedness  and neck stiffness. Negative for weakness, altered level of consciousness , altered mental status, extremity weakness, paresthesias, involuntary movement, seizure and syncope.      Allergies  Prednisone  Home Medications   Prior to Admission medications   Medication Sig Start Date End Date Taking? Authorizing Provider  aspirin EC 81 MG tablet Take 81 mg by mouth daily.   Yes Historical Provider, MD  atorvastatin (LIPITOR) 80 MG tablet Take 1 tablet (80 mg total) by mouth daily at 6 PM. 10/26/14  Yes Dwana Melena, PA-C  busPIRone (BUSPAR) 7.5 MG tablet Take 7.5 mg by mouth 2 (two) times daily.   Yes Historical Provider, MD  DULoxetine  (CYMBALTA) 60 MG capsule Take 60 mg by mouth daily.   Yes Historical Provider, MD  gabapentin (NEURONTIN) 600 MG tablet Take 1,200 mg by mouth 3 (three) times daily.   Yes Historical Provider, MD  lisinopril (PRINIVIL,ZESTRIL) 2.5 MG tablet Take 1 tablet (2.5 mg total) by mouth daily. 05/11/15  Yes Antoine Poche, MD  metoprolol tartrate (LOPRESSOR) 25 MG tablet Take 0.5 tablets (12.5 mg total) by mouth 2 (two) times daily. 10/26/14  Yes Dwana Melena, PA-C  omeprazole (PRILOSEC) 20 MG capsule Take 20 mg by mouth daily.   Yes Historical Provider, MD  oxyCODONE-acetaminophen (PERCOCET) 10-325 MG per tablet Take 1 tablet by mouth 3 (three) times daily as needed for pain.   Yes Historical Provider, MD  ticagrelor (BRILINTA) 90 MG TABS tablet Take 1 tablet (90 mg total) by mouth 2 (two) times daily. 10/26/14  Yes Dwana Melena, PA-C  tiZANidine (ZANAFLEX) 4 MG tablet Take 4 mg by mouth every 8 (eight) hours as needed for muscle spasms.  02/22/15  Yes Historical Provider, MD  acetaminophen (TYLENOL) 500 MG tablet Take 1,000 mg by mouth every 6 (six) hours as needed for mild pain.     Historical Provider, MD  nitroGLYCERIN (NITROSTAT) 0.4 MG SL tablet Place 1 tablet (0.4 mg total) under the tongue every 5 (five) minutes as needed for chest pain. 10/26/14   Dwana Melena, PA-C   BP 106/69 mmHg  Pulse 59  Temp(Src) 97.8 F (36.6 C) (Oral)  Resp 13  Ht 5' 2.5" (1.588 m)  Wt 200 lb (90.719 kg)  BMI 35.97 kg/m2  SpO2 99% Physical Exam  1350: Physical examination:  Nursing notes reviewed; Vital signs and O2 SAT reviewed;  Constitutional: Well developed, Well nourished, Well hydrated, In no acute distress; Head:  Normocephalic, atraumatic; Eyes: EOMI, PERRL, No scleral icterus; ENMT: Mouth and pharynx normal, Mucous membranes moist; Neck: Supple, Full range of motion, No lymphadenopathy; Cardiovascular: Regular rate and rhythm, No gallop; Respiratory: Breath sounds clear & equal bilaterally, No wheezes.  Speaking  full sentences with ease, Normal respiratory effort/excursion; Chest: +bilat parasternal areas tender to palp.  Movement normal; Abdomen: Soft, Nontender, Nondistended, Normal bowel sounds; Genitourinary: No CVA tenderness; Spine:  No midline CS, TS, LS tenderness. +TTP bilat thoracic paraspinal muscles. No rash.;; Extremities: Pulses normal, No tenderness, No edema, No calf edema or asymmetry.; Neuro: AA&Ox3, Major CN grossly intact.  Speech clear. No gross focal motor or sensory deficits in extremities.; Skin: Color normal, Warm, Dry.   ED Course  Procedures (including critical care time)   Labs Review  Imaging Review  I have personally reviewed and evaluated these images and lab results as part of my medical decision-making.   EKG Interpretation   Date/Time:  Monday July 30 2015 11:47:38 EDT Ventricular Rate:  87 PR  Interval:  150 QRS Duration: 84 QT Interval:  384 QTC Calculation: 462 R Axis:   62 Text Interpretation:  Sinus rhythm with occasional Premature ventricular  complexes Septal infarct , age undetermined When compared with ECG of  10/25/2014 T wave abnormality Lateral leads has improved Confirmed by  [facility]  MD, Nicholos Johns [phone]) on 07/30/2015 2:50:42 PM      MDM  MDM Reviewed: previous chart, nursing note and vitals Reviewed previous: labs and ECG Interpretation: labs, ECG and x-ray      Results for orders placed or performed during the hospital encounter of 07/30/15  Basic metabolic panel  Result Value Ref Range   Sodium 140 135 - 145 mmol/L   Potassium 3.9 3.5 - 5.1 mmol/L   Chloride 106 101 - 111 mmol/L   CO2 27 22 - 32 mmol/L   Glucose, Bld 132 (H) 65 - 99 mg/dL   BUN 9 6 - 20 mg/dL   Creatinine, Ser 8.11 0.44 - 1.00 mg/dL   Calcium 8.6 (L) 8.9 - 10.3 mg/dL   GFR calc non Af Amer >60 >60 mL/min   GFR calc Af Amer >60 >60 mL/min   Anion gap 7 5 - 15  CBC  Result Value Ref Range   WBC 8.5 4.0 - 10.5 K/uL   RBC 4.71 3.87 - 5.11 MIL/uL    Hemoglobin 13.4 12.0 - 15.0 g/dL   HCT 91.4 78.2 - 95.6 %   MCV 86.4 78.0 - 100.0 fL   MCH 28.5 26.0 - 34.0 pg   MCHC 32.9 30.0 - 36.0 g/dL   RDW 21.3 08.6 - 57.8 %   Platelets 254 150 - 400 K/uL  Troponin I  Result Value Ref Range   Troponin I <0.03 <0.031 ng/mL  D-dimer, quantitative  Result Value Ref Range   D-Dimer, Quant 0.46 0.00 - 0.48 ug/mL-FEU  Urinalysis, Routine w reflex microscopic  Result Value Ref Range   Color, Urine YELLOW YELLOW   APPearance CLEAR CLEAR   Specific Gravity, Urine 1.025 1.005 - 1.030   pH 6.0 5.0 - 8.0   Glucose, UA NEGATIVE NEGATIVE mg/dL   Hgb urine dipstick TRACE (A) NEGATIVE   Bilirubin Urine NEGATIVE NEGATIVE   Ketones, ur NEGATIVE NEGATIVE mg/dL   Protein, ur NEGATIVE NEGATIVE mg/dL   Urobilinogen, UA 0.2 0.0 - 1.0 mg/dL   Nitrite NEGATIVE NEGATIVE   Leukocytes, UA NEGATIVE NEGATIVE  Pregnancy, urine  Result Value Ref Range   Preg Test, Ur NEGATIVE NEGATIVE  Urine microscopic-add on  Result Value Ref Range   Squamous Epithelial / LPF FEW (A) RARE   WBC, UA 0-2 <3 WBC/hpf   RBC / HPF 3-6 <3 RBC/hpf   Bacteria, UA RARE RARE   Dg Chest 2 View 07/30/2015   CLINICAL DATA:  Left-sided chest pain radiating to her back for 2 days. History of MI in January 2016 with 2 stents placed.  EXAM: CHEST  2 VIEW  COMPARISON:  Chest x-ray dated 10/24/2014.  FINDINGS: Heart size is normal. Overall cardiomediastinal silhouette is normal in size and configuration. One of the coronary artery stents described in the given clinical data are visible on the chest x-ray. No evidence of congestive heart failure.  Lungs are clear. No evidence of pneumonia. No pleural effusion. No pneumothorax. Very mild degenerative change noted within the thoracic spine. No acute osseous abnormality.  IMPRESSION: No evidence of acute cardiopulmonary abnormality. Heart size is normal. Lungs are clear.   Electronically Signed   By: Bary Richard  M.D.   On: 07/30/2015 12:30    1600:   Doubt PE as cause for symptoms with normal d-dimer and low risk [place].  Doubt ACS as cause for symptoms with normal troponin and unremarkable EKG from previous after 2 to 3 days of constant symptoms. Pt is in Pain Management at [facility]; continue usual meds. Pt states she is ready to go home now. Dx and testing d/w pt.  Questions answered.  Verb understanding, agreeable to d/c home with outpt f/u.    Samuel Jester, DO 08/02/15 1558

## 2015-07-30 NOTE — Discharge Instructions (Signed)
°Emergency Department Resource Guide °1) Find a Doctor and Pay Out of Pocket °Although you won't have to find out who is covered by your insurance plan, it is a good idea to ask around and get recommendations. You will then need to call the office and see if the doctor you have chosen will accept you as a new patient and what types of options they offer for patients who are self-pay. Some doctors offer discounts or will set up payment plans for their patients who do not have insurance, but you will need to ask so you aren't surprised when you get to your appointment. ° °2) Contact Your Local Health Department °Not all health departments have doctors that can see patients for sick visits, but many do, so it is worth a call to see if yours does. If you don't know where your local health department is, you can check in your phone book. The CDC also has a tool to help you locate your state's health department, and many state websites also have listings of all of their local health departments. ° °3) Find a Walk-in Clinic °If your illness is not likely to be very severe or complicated, you may want to try a walk in clinic. These are popping up all over the country in pharmacies, drugstores, and shopping centers. They're usually staffed by nurse practitioners or physician assistants that have been trained to treat common illnesses and complaints. They're usually fairly quick and inexpensive. However, if you have serious medical issues or chronic medical problems, these are probably not your best option. ° °No Primary Care Doctor: °- Call [facility] at  832-8000 - they can help you locate a primary care doctor that  accepts your insurance, provides certain services, etc. °- Physician Referral Service- 1-800-533-3463 ° °Chronic Pain Problems: °Organization         Address  Phone   Notes  °[facility]  (336) 297-2271 Patients need to be referred by their primary care doctor.  ° °Medication  Assistance: °Organization         Address  Phone   Notes  °[place] [facility] [address] °[place], [place] [address] (336) 641-8030 --Must be a resident of [place] °-- Must have NO insurance coverage whatsoever (no Medicaid/ Medicare, etc.) °-- The pt. MUST have a primary care doctor that directs their care regularly and follows them in the community °  °[facility]  (866) 331-1348   °[employer]  (888) 892-1162   ° °Agencies that provide inexpensive medical care: °Organization         Address  Phone   Notes  °[facility]  (336) 832-8035   °[facility]    (336) 832-7272   °[facility] [address] °[place], [place] [address] (336) 832-4777   °[facility] [address], °[place] (336) 271-4999   °[facility]    (336) 373-0678   °[facility]    (336) 272-1050   °[facility] ° [address], [place] Phone:  (336) 832-4444, Fax:  (336) 832-4440 Hours of Operation:  9 am - 6 pm, M-F.  Also accepts Medicaid/Medicare and self-pay.  °[facility] ° [address],  Phone: (336) 832-3150, Fax: (336) [phone]. Hours of Operation:  8:30 am - 5:30 pm, M-F.  Also accepts Medicaid and self-pay.  °[facility] [place] [address], [place] Phone: (336) 878-6027   °[facility] [address], [place], [place] (336)723-1848, Ext. 123 Mondays & Thursdays: 7-9 AM.  First 15 patients are seen on a first come, first serve basis. °  ° °Medicaid-accepting [place] Providers: ° °Organization         Address  Phone   Notes  °[facility] [address], [place] (336) 641-2100 Also accepts self-pay patients.  °[facility] [address], [place] ° (336) 856-9996   °[facility] [address], [place]  (336) 288-8857   °[facility] [address] [place] [address], [place] (336) 299-7000   °Veita Bland [address], [place]  ° (336) 373-1557 Only accepts [place] Access Medicaid patients after they have their name applied to their card.  ° °Self-Pay (no insurance) in [place]: ° °Organization         Address  Phone   Notes  °Sickle Cell Patients, [facility] [address], [place] (336) 832-1970   °[facility] [address], [place] (336) 832-4400   °[facility]  ° [address],  (336) 992-4800   °[facility]/Dr. Osei-Bonsu ° [address] [place] [address], [place] or [address], [place] (336) [phone] Phone number for both [place] and [place] locations is the same.  °[facility] [address], [place] (336) 299-0000   °[facility] [address] [place] [address], [place] or [address] (336) 852-7530 °(336) 878-2260   °[facility] [address], [place] (336) 350-1642, phone; (336) 294-5005, fax Sees patients 1st and 3rd Saturday of every month.  Must not qualify for public or private insurance (i.e. Medicaid, Medicare, [place] Health Choice, Veterans' Benefits) • Household income should be no more than 200% of the poverty level •The clinic cannot treat you if you are pregnant or think you are pregnant • Sexually transmitted diseases are not treated at the clinic.  ° ° °Dental Care: °Organization         Address  Phone  Notes  °[place] [facility] [address], [place] (336) 641-6152 Accepts children up to age 21 who are enrolled in Medicaid or [place] Health Choice; pregnant women with a Medicaid card; and children who have applied for Medicaid or [place] Health Choice, but were declined, whose parents can pay a reduced fee at time of service.  °[place]  [facility] [place]  [address], [place] (336) 641-7733 Accepts children up to age 21 who are enrolled in Medicaid or [place] Health Choice; pregnant women with a Medicaid card; and children who have applied for Medicaid or  Health Choice, but were declined, whose parents can pay a reduced fee at time of service.  °[facility] ° [address], [place] (336) 641-4533 Patients are seen by appointment only. Walk-ins are not accepted. [facility] will see patients 18 years of age and older. °Monday - Tuesday (8am-5pm) °Most Wednesdays (8:30-5pm) °$30 per visit, cash only  °[facility] ° [address], [place] (336) 641-4533 Patients are seen by appointment only. Walk-ins are not accepted. [facility] will see patients 18 years of age and older. °One   Wednesday Evening (Monthly: Volunteer Based).  $30 per visit, cash only  °[facility]  (919) 537-3737 for adults; Children under age 4, call Graduate Pediatric Dentistry at (919) [phone]. Children aged 4-14, please call (919) [phone] to request a pediatric application. ° Dental services are provided in all areas of dental care including fillings, crowns and bridges, complete and partial dentures, implants, gum treatment, root canals, and extractions. Preventive care is also provided. Treatment is provided to both adults and children. °Patients are selected via a lottery and there is often a waiting list. °  °[facility] [address], °[place] ° (336) 763-8833 [email or web address] °  °[facility] [address], [place], [place] (336)723-1848, Ext. 123 Second and Fourth Thursday of each month, opens at 6:30 AM; Clinic ends at 9 AM.  Patients are seen on a first-come first-served basis, and a limited number are seen during each clinic.  ° °[facility] ° [address], [place], [place] (336) [phone]    Eligibility Requirements °You must have lived in [place], [place], or [place] counties for at least the last three months. °  You cannot be eligible for state or federal sponsored healthcare insurance, including Veterans Administration, Medicaid, or Medicare. °  You generally cannot be eligible for healthcare insurance through your employer.  °  How to apply: °Eligibility screenings are held every Tuesday and Wednesday afternoon from 1:00 pm until 4:00 pm. You do not need an appointment for the interview!  °[facility] [address], [place], [place] 336-631-2330   °[facility]  336-342-8273   °[facility]  336-703-3100   °[facility]  336-570-6415   ° °Behavioral Health Resources in the Community: °Intensive Outpatient Programs °Organization         Address  Phone  Notes  °[facility] [address], [place], [place] 336-878-6098   °[facility] [address], [place], [place] 336-832-9800   °[facility] [address], [place], [place] ° 336-882-2125   °[facility] [address],  °[place], [place] 1-800-853-5163 or 336-641-4981   °Substance Abuse Resources °Organization         Address  Phone  Notes  °[facility]  336-882-2125   °[facility]  336-784-9470   °[facility]  336-285-9073   °[facility]  336-845-3988   °Residential & Outpatient Substance Abuse Program  1-800-659-3381   °Psychological Services °Organization         Address  Phone  Notes  °[facility]  336- 832-9600   °[facility]  336- 378-7881   °[facility] [address], [place] 1-800-853-5163 or 336-641-4981   ° °Mobile Crisis Teams °Organization         Address  Phone  Notes  °[facility], Mobile Crisis Care Unit  1-877-626-1772   °Assertive °[facility] ° [address]  [place], [place] 336-834-9664   °Sharon DeEsch [address] °[place] 336-554-5454   ° °Self-Help/Support Groups °Organization         Address  Phone             Notes  °[facility] - variety of support groups  336- 373-1402 Call for more information  °Narcotics Anonymous (NA), [facility] [address], °[place]  2 meetings at this location  ° °  Residential Treatment Programs °Organization         Address  Phone  Notes  °[facility] [address],    °[place]  1-866-801-8205   °[facility] ° [address], [place], [place] 704-293-8524   °[facility] [address], [place] 336-845-3988 Admissions: 8am-3pm M-F  °[facility] [address]    °[place], [place] 336-841-1104   °[facility] [address], [place], [place] 336-379-7146   °[facility] [address]  °[place], [place] 336-285-9073   °[facility] - Intensive Outpatient [address], [place], [place] 336-852-3033   °[facility] [address]  °[place], [place] 1-877-615-2722 or 336-784-9470   °[facility] [address] [place], [place] 336-227-7417 Accepts Medicaid  °[facility] [address]  °[place] 1-800-659-3381 Substance Abuse/Addiction Treatment  ° °[place] Behavioral Health Resources °Organization         Address  Phone  Notes  °[facility]  (888) 581-9988   °Julie Brannon, PhD [address] [place], [place]   (336) 349-5553 or (336) 951-0000   °[facility]   [address] °[place], [place] (336) 349-4454   °[facility] [address], [place], [place] (336) 342-8316 Insurance/Medicaid/sponsorship through [facility]  °[facility] [address]                                    [place], [place] (336) 342-8316 Therapy/tele-psych/case    °[facility] [address]  ° [place], [place] (336) 349-2233    °Dr. Arfeen  (336) 349-4544   °[facility] [place]  [employer] [place] [facility] 1) [address], [place] °2) [address], [place] °3)  [address], [place] (336) 349-3220 °(336) 342-7768 ° °(336) 342-8140   °[place] [facility] (336) 342-1394 or (336) 342-3537 (After Hours)    ° ° °Take your usual prescriptions as previously directed.  Apply moist heat or ice to the area(s) of discomfort, for 15 minutes at a time, several times per day for the next few days.  Do not fall asleep on a heating or ice pack.  Call your regular medical doctor today to schedule a follow up appointment in the next 2 days.  Return to the Emergency Department immediately if worsening. ° °

## 2015-09-04 ENCOUNTER — Telehealth: Payer: Self-pay | Admitting: Cardiology

## 2015-09-04 MED ORDER — TICAGRELOR 90 MG PO TABS: 90.0000 mg | ORAL_TABLET | Freq: Two times a day (BID) | ORAL | Status: DC

## 2015-09-04 NOTE — Telephone Encounter (Signed)
Pt's medication was sent to pt's pharmacy requested. Confirmation received.  

## 2015-09-04 NOTE — Telephone Encounter (Signed)
Pt states she is running out of her Brilinta and that she will need more before the end of December

## 2015-09-10 ENCOUNTER — Other Ambulatory Visit: Payer: Self-pay | Admitting: Cardiology

## 2015-09-10 NOTE — Telephone Encounter (Signed)
Pt needs to call refill number on bottle for Brilinta as she gets it for free,ststaes she now see's number for refill,will call the today.I cx refill sent to her local pharmacy

## 2015-09-10 NOTE — Telephone Encounter (Signed)
Patient needs Brilinta sent to assistance program for refill / tg

## 2015-11-28 ENCOUNTER — Ambulatory Visit (INDEPENDENT_AMBULATORY_CARE_PROVIDER_SITE_OTHER): Payer: Self-pay | Admitting: Cardiology

## 2015-11-28 ENCOUNTER — Encounter: Payer: Self-pay | Admitting: Cardiology

## 2015-11-28 VITALS — BP 112/78 | HR 88 | Ht 62.5 in | Wt 192.0 lb

## 2015-11-28 DIAGNOSIS — I10 Essential (primary) hypertension: Secondary | ICD-10-CM

## 2015-11-28 DIAGNOSIS — R079 Chest pain, unspecified: Secondary | ICD-10-CM

## 2015-11-28 DIAGNOSIS — I25810 Atherosclerosis of coronary artery bypass graft(s) without angina pectoris: Secondary | ICD-10-CM

## 2015-11-28 DIAGNOSIS — E785 Hyperlipidemia, unspecified: Secondary | ICD-10-CM

## 2015-11-28 MED ORDER — NITROGLYCERIN 0.4 MG SL SUBL: 0.4000 mg | SUBLINGUAL_TABLET | SUBLINGUAL | Status: DC | PRN

## 2015-11-28 NOTE — Patient Instructions (Signed)
Medication Instructions:  Your physician recommends that you continue on your current medications as directed. Please refer to the Current Medication list given to you today.   Labwork: NONE  Testing/Procedures: Your physician has requested that you have a dobutamine echocardiogram. For further information please visit [email or web address] Please follow instruction sheet as given.     Follow-Up: Your physician recommends that you schedule a follow-up appointment in: 3 MONTHS WITH DR. BRANCH   Any Other Special Instructions Will Be Listed Below (If Applicable).     If you need a refill on your cardiac medications before your next appointment, please call your pharmacy.

## 2015-11-28 NOTE — Progress Notes (Signed)
Patient ID: Tanya Lowe, female   DOB: 11-05-1970, 45 y.o.   MRN: [id number]     Clinical Summary Ms. Roebuck is a 45 y.o.female seen today for follow up of the following medical problems.   1. CAD - history of NSTEMI Jan 2016, s/p DES to mid RCA and DES to distal RCA and DES to mid LAD.  - 04/2015 echo LVEF 55-60%  - seen in [facility] late Jan 2017 with chest pain. Pain started day before. Sharp pain 8/10 after eating bacon, egg, cheese sandwich. Under right breast and into chest and right chest. Not positional. Mild nausea. No SOB. Pain lasted for several minutes. Has had multiple recurrent episodes over the last fw weeeks - denies any SOB or DOE  2. HTN - compliant with meds  3. Hyperlipidemia - compliant with statin  Past Medical History  Diagnosis Date  . Hypertension   . Arthritis   . Fibromyalgia   . Kidney stones     hx of w previous lithotripsy  . MI (myocardial infarction) (HCC)   . Chronic neck and back pain   . Thoracic back pain   . Cervical radiculopathy   . Bilateral hand pain   . Pain management     [facility]     Allergies  Allergen Reactions  . Prednisone Hypertension    Tachycardia      Current Outpatient Prescriptions  Medication Sig Dispense Refill  . acetaminophen (TYLENOL) 500 MG tablet Take 1,000 mg by mouth every 6 (six) hours as needed for mild pain.     Marland Kitchen aspirin EC 81 MG tablet Take 81 mg by mouth daily.    Marland Kitchen atorvastatin (LIPITOR) 80 MG tablet Take 1 tablet (80 mg total) by mouth daily at 6 PM. 30 tablet 11  . busPIRone (BUSPAR) 7.5 MG tablet Take 7.5 mg by mouth 2 (two) times daily.    . DULoxetine (CYMBALTA) 60 MG capsule Take 60 mg by mouth daily.    Marland Kitchen gabapentin (NEURONTIN) 600 MG tablet Take 1,200 mg by mouth 3 (three) times daily.    Marland Kitchen lisinopril (PRINIVIL,ZESTRIL) 2.5 MG tablet Take 1 tablet (2.5 mg total) by mouth daily. 90 tablet 3  . metoprolol tartrate (LOPRESSOR) 25 MG tablet Take 0.5 tablets (12.5 mg total) by mouth 2 (two) times  daily. 60 tablet 5  . nitroGLYCERIN (NITROSTAT) 0.4 MG SL tablet Place 1 tablet (0.4 mg total) under the tongue every 5 (five) minutes as needed for chest pain. 25 tablet 12  . omeprazole (PRILOSEC) 20 MG capsule Take 20 mg by mouth daily.    Marland Kitchen oxyCODONE-acetaminophen (PERCOCET) 10-325 MG per tablet Take 1 tablet by mouth 3 (three) times daily as needed for pain.    . ticagrelor (BRILINTA) 90 MG TABS tablet Take 1 tablet (90 mg total) by mouth 2 (two) times daily. 60 tablet 8  . tiZANidine (ZANAFLEX) 4 MG tablet Take 4 mg by mouth every 8 (eight) hours as needed for muscle spasms.      No current facility-administered medications for this visit.     Past Surgical History  Procedure Laterality Date  . Spinal fusion      L5-S1  . Tubal ligation    . Left heart catheterization with coronary angiogram N/A 10/25/2014    Procedure: LEFT HEART CATHETERIZATION WITH CORONARY ANGIOGRAM;  Surgeon: Kathleene Hazel, MD;  [facility] C[facility] LAB;  Service: Cardiovascular;  Laterality: N/A;  . Lithotripsy  about 2011  . Cystoscopy with retrograde pyelogram,  ureteroscopy and stent placement Left 03/14/2015    Procedure: CYSTOSCOPY WITH LEFT RETROGRADE PYELOGRAM;  Surgeon: Alfredo Martinez, MD;  Location: [facility];  Service: Urology;  Laterality: Left;  . Cystoscopy with retrograde pyelogram, ureteroscopy and stent placement Left 03/20/2015    Procedure: CYSTOSCOPY WITH LEFT  RETROGRADE LEFT URETEROSCOPY AND LEFT  STENT ;  Surgeon: Bjorn Pippin, MD;  Location: [facility];  Service: Urology;  Laterality: Left;  . Holmium laser application Left 03/20/2015    Procedure: HOLMIUM LASER APPLICATION;  Surgeon: Bjorn Pippin, MD;  Location: [facility];  Service: Urology;  Laterality: Left;  . Cardiac surgery       Allergies  Allergen Reactions  . Prednisone Hypertension    Tachycardia       Family History  Problem Relation Age of Onset  . Diabetes Mother   . Stroke Mother   . Seizures Other   . Cancer Other   .  Diabetes Other      Social History Ms. Baumbach reports that she has been smoking Cigarettes.  She started smoking about 25 years ago. She has a 6 pack-year smoking history. She has never used smokeless tobacco. Ms. Calmes reports that she does not drink alcohol.   Review of Systems CONSTITUTIONAL: No weight loss, fever, chills, weakness or fatigue.  HEENT: Eyes: No visual loss, blurred vision, double vision or yellow sclerae.No hearing loss, sneezing, congestion, runny nose or sore throat.  SKIN: No rash or itching.  CARDIOVASCULAR: per HPI RESPIRATORY: No shortness of breath, cough or sputum.  GASTROINTESTINAL: No anorexia, nausea, vomiting or diarrhea. No abdominal pain or blood.  GENITOURINARY: No burning on urination, no polyuria NEUROLOGICAL: No headache, dizziness, syncope, paralysis, ataxia, numbness or tingling in the extremities. No change in bowel or bladder control.  MUSCULOSKELETAL: No muscle, back pain, joint pain or stiffness.  LYMPHATICS: No enlarged nodes. No history of splenectomy.  PSYCHIATRIC: No history of depression or anxiety.  ENDOCRINOLOGIC: No reports of sweating, cold or heat intolerance. No polyuria or polydipsia.  Marland Kitchen   Physical Examination Filed Vitals:   11/28/15 0859  BP: 112/78  Pulse: 88   Filed Vitals:   11/28/15 0859  Height: 5' 2.5" (1.588 m)  Weight: 192 lb (87.091 kg)    Gen: resting comfortably, no acute distress HEENT: no scleral icterus, pupils equal round and reactive, no palptable cervical adenopathy,  CV: RRR, no m/r/g, no jvd Resp: Clear to auscultation bilaterally GI: abdomen is soft, non-tender, non-distended, normal bowel sounds, no hepatosplenomegaly MSK: extremities are warm, no edema.  Skin: warm, no rash Neuro:  no focal deficits Psych: appropriate affect   Diagnostic Studies  Jan 2016 Cath PCI Note: She was given an additional 5000 units IV heparin x 1. ACT was over 200. She was given Brilinta 180 mg po x 1.   Lesion #  1 Distal RCA: JR4 guiding catheter used to engage the RCA. Cougar IC wire down the RCA. 2.0 x 15 mm balloon x 1 for pre-dilatation. 2.25 x 16 mm Promus Premier DES x 1 distal RCA post-dilated with a 2.5 x 12 mm [place] balloon x 1. Stenosis taken from 90% down to 0%.   Lesion # 2 mid RCA: 2.0 x 15 mm balloon x 1. 3.0 x 12 mm Promus Premier DES x 1. The mid stenosis was followed by an aneurysmal segment with a size mismatch before the distal vessel so stents were not overlapped. Post-dilatation with a 3.5 x 8 mm [place] balloon x 1. Stenosis taken from  95% down to 0%.   Lesion #3 mid LAD: XB LAD 3.5 guiding catheter to engage left main. 2.5 x 12 mm balloon x 1 for pre-dilatation. 3.5 x 16 mm Promus Premier DES x 1 mid LAD. Stent post-dilated with a 3.5 x 8 mm [place] balloon x 2. Stenosis taken from 80% down to 0%.  The sheath was removed from the right radial artery and a Terumo hemostasis band was applied at the arteriotomy site on the right wrist. There were no immediate complications. The patient was taken to the recovery area in stable condition.   Hemodynamic Findings: Central aortic pressure: 115/66 Left ventricular pressure: 121/3/14  Angiographic Findings:  Left main: No obstructive disease.   Left Anterior Descending Artery: Large caliber vessel that courses to the apex. 80% mid stenosis. Small caliber diagonal Nyko Gell. The distal LAD tapers to a small caliber vessel.   Circumflex Artery: Large caliber vessel with 20% proximal stenosis.   Right Coronary Artery: Large dominant vessel with diffuse 30% proximal stenosis, 95% mid stenosis followed by an aneurysmal segment. Distal 90% stenosis. Diffuse mild plaque in the posterolateral artery and PDA.   Left Ventricular Angiogram: LVEF=55-60%.   Impression: 1. Severe double vessel CAD 2. NSTEMI 3. Normal LV systolic function 4. Successful PTCA/DES x 1 mid RCA and PTCA/DES x 1 distal RCA (stents not overlapped due to size mismatch in the vessel and  the presence of an aneurysmal segment in the distal segment of the mid vessel) 5. Successful PTCA/DES x 1 mid LAD  Recommendations: Will need dual anti-platelet therapy with ASA and Brilinta for at least one year. Continue beta blocker and statin. Tobacco cessation.    04/2015 echo Study Conclusions  - Left ventricle: The cavity size was normal. Wall thickness was increased in a pattern of mild LVH. Systolic function was normal. The estimated ejection fraction was in the range of 55% to 60%. Wall motion was normal; there were no regional wall motion abnormalities. Left ventricular diastolic function parameters were normal. - Aortic valve: Mildly calcified annulus. Trileaflet; mildly thickened leaflets. Valve area (VTI): 2.07 cm^2. Valve area (Vmax): 2.2 cm^2. - Atrial septum: No defect or patent foramen ovale was identified. - Technically adequate study.      Assessment and Plan   1. CAD - history of NSTEMI, s/p stenting to RCA and LAD - recent episodes of chest pain, will order DSE - cannot run on treadmill due to chronic back pain  2. HTN - at goal, continue current meds  3. Hyperlipidemia - no recent panel in our system, request pcp labs - continue statin  F/u 3 months Antoine Poche, M.D.

## 2015-11-30 ENCOUNTER — Ambulatory Visit (HOSPITAL_COMMUNITY)
Admission: RE | Admit: 2015-11-30 | Discharge: 2015-11-30 | Disposition: A | Discharge status: Home / Self Care | Payer: Medicaid Other | Source: Ambulatory Visit | Attending: Cardiology | Admitting: Cardiology

## 2015-11-30 DIAGNOSIS — R079 Chest pain, unspecified: Secondary | ICD-10-CM | POA: Insufficient documentation

## 2015-11-30 LAB — ECHOCARDIOGRAM STRESS TEST
CHL CUP RESTING HR STRESS: 96 {beats}/min
CSEPPHR: 153 {beats}/min
MPHR: 173 {beats}/min
Percent HR: 88 %

## 2015-11-30 MED ORDER — DOBUTAMINE IN D5W 4-5 MG/ML-% IV SOLN
INTRAVENOUS | Status: AC
  Administered 2015-11-30: 10 ug via INTRAVENOUS
  Filled 2015-11-30: qty 250

## 2015-11-30 MED ORDER — SODIUM CHLORIDE 0.9% FLUSH
INTRAVENOUS | Status: AC
  Administered 2015-11-30: 10 mL via INTRAVENOUS
  Filled 2015-11-30: qty 10

## 2015-12-03 ENCOUNTER — Telehealth: Payer: Self-pay | Admitting: Cardiology

## 2015-12-03 ENCOUNTER — Other Ambulatory Visit: Payer: Self-pay

## 2015-12-03 NOTE — Telephone Encounter (Signed)
Results of stress test / tg  °

## 2015-12-03 NOTE — Telephone Encounter (Signed)
Pt aware to stop brilinta

## 2015-12-03 NOTE — Telephone Encounter (Signed)
Yes, ok to stop brillinta  Dominga Ferry MD

## 2015-12-03 NOTE — Telephone Encounter (Signed)
Pt told test was normal,wants to know if she can stop Brilinta now    Will forward to Dr Wyline Mood

## 2016-02-29 ENCOUNTER — Encounter: Payer: Self-pay | Admitting: Cardiology

## 2016-02-29 ENCOUNTER — Ambulatory Visit (INDEPENDENT_AMBULATORY_CARE_PROVIDER_SITE_OTHER): Payer: Medicare Other | Admitting: Cardiology

## 2016-02-29 VITALS — BP 110/78 | HR 95 | Ht 62.5 in | Wt 196.0 lb

## 2016-02-29 DIAGNOSIS — I10 Essential (primary) hypertension: Secondary | ICD-10-CM | POA: Diagnosis not present

## 2016-02-29 DIAGNOSIS — R0789 Other chest pain: Secondary | ICD-10-CM

## 2016-02-29 DIAGNOSIS — I25810 Atherosclerosis of coronary artery bypass graft(s) without angina pectoris: Secondary | ICD-10-CM | POA: Diagnosis not present

## 2016-02-29 DIAGNOSIS — E785 Hyperlipidemia, unspecified: Secondary | ICD-10-CM

## 2016-02-29 MED ORDER — VARENICLINE TARTRATE 1 MG PO TABS: ORAL_TABLET | ORAL | Status: DC

## 2016-02-29 NOTE — Patient Instructions (Signed)
Your physician wants you to follow-up in: 6 Months with Dr. Wyline MoodBranch. You will receive a reminder letter in the mail two months in advance. If you don't receive a letter, please call our office to schedule the follow-up appointment.  Your physician has recommended you make the following change in your medication:   Start Chantix   0.5 mg for 3 Days Then 0.5 mg Two Times Daily for 4 Days, and Then 1 mg Daily for 11 Weeks  You have been given a Chantix starting pack and saving coupon.  If you need a refill on your cardiac medications before your next appointment, please call your pharmacy.  Thank you for choosing [facility]!

## 2016-02-29 NOTE — Progress Notes (Signed)
Patient ID: ALEXIS REBER, female   DOB: 06/17/1971, 45 y.o.   MRN: [id number]     Clinical Summary Ms. Dobesh is a 45 y.o.female seen today for follow up of the following medical problems.   1. CAD - history of NSTEMI Jan 2016, s/p DES to mid RCA and DES to distal RCA and DES to mid LAD.  - 04/2015 echo LVEF 55-60% -11/2015 DSE without ischemia - reports some chest pains yesterday. Dull pain midchest, 3/10. Occurs at rest or activity. Lasts for just few minutes. No other associated symptoms. Overall unchanged from last visit Not positional.  - compliant with meds.  2. HTN - compliant with meds  3. Hyperlipidemia - compliant with statin  4. Tobacco abuse - reports she quit in the past with chantix however restarted.   Past Medical History  Diagnosis Date  . Hypertension   . Arthritis   . Fibromyalgia   . Kidney stones     hx of w previous lithotripsy  . MI (myocardial infarction) (HCC)   . Chronic neck and back pain   . Thoracic back pain   . Cervical radiculopathy   . Bilateral hand pain   . Pain management     [facility]     Allergies  Allergen Reactions  . Prednisone Hypertension    Tachycardia      Current Outpatient Prescriptions  Medication Sig Dispense Refill  . acetaminophen (TYLENOL) 500 MG tablet Take 1,000 mg by mouth every 6 (six) hours as needed for mild pain.     Marland Kitchen aspirin EC 81 MG tablet Take 81 mg by mouth daily.    Marland Kitchen atorvastatin (LIPITOR) 80 MG tablet Take 1 tablet (80 mg total) by mouth daily at 6 PM. 30 tablet 11  . busPIRone (BUSPAR) 7.5 MG tablet Take 7.5 mg by mouth 2 (two) times daily.    . DULoxetine (CYMBALTA) 60 MG capsule Take 60 mg by mouth daily.    Marland Kitchen gabapentin (NEURONTIN) 600 MG tablet Take 1,200 mg by mouth 3 (three) times daily.    Marland Kitchen lisinopril (PRINIVIL,ZESTRIL) 2.5 MG tablet Take 1 tablet (2.5 mg total) by mouth daily. 90 tablet 3  . metoprolol tartrate (LOPRESSOR) 25 MG tablet Take 0.5 tablets (12.5 mg total) by mouth 2 (two) times  daily. 60 tablet 5  . nitroGLYCERIN (NITROSTAT) 0.4 MG SL tablet Place 1 tablet (0.4 mg total) under the tongue every 5 (five) minutes as needed for chest pain. 25 tablet 3  . omeprazole (PRILOSEC) 20 MG capsule Take 20 mg by mouth daily.    Marland Kitchen oxyCODONE-acetaminophen (PERCOCET) 10-325 MG per tablet Take 1 tablet by mouth 3 (three) times daily as needed for pain.    Marland Kitchen tiZANidine (ZANAFLEX) 4 MG tablet Take 4 mg by mouth every 8 (eight) hours as needed for muscle spasms.      No current facility-administered medications for this visit.     Past Surgical History  Procedure Laterality Date  . Spinal fusion      L5-S1  . Tubal ligation    . Left heart catheterization with coronary angiogram N/A 10/25/2014    Procedure: LEFT HEART CATHETERIZATION WITH CORONARY ANGIOGRAM;  Surgeon: Kathleene Hazel, MD;  Location: [facility] CATH LAB;  Service: Cardiovascular;  Laterality: N/A;  . Lithotripsy  about 2011  . Cystoscopy with retrograde pyelogram, ureteroscopy and stent placement Left 03/14/2015    Procedure: CYSTOSCOPY WITH LEFT RETROGRADE PYELOGRAM;  Surgeon: Alfredo Martinez, MD;  Location: [facility] ORS;  Service: Urology;  Laterality: Left;  . Cystoscopy with retrograde pyelogram, ureteroscopy and stent placement Left 03/20/2015    Procedure: CYSTOSCOPY WITH LEFT  RETROGRADE LEFT URETEROSCOPY AND LEFT  STENT ;  Surgeon: Bjorn Pippin, MD;  Location: [facility] ORS;  Service: Urology;  Laterality: Left;  . Holmium laser application Left 03/20/2015    Procedure: HOLMIUM LASER APPLICATION;  Surgeon: Bjorn Pippin, MD;  Location: [facility] ORS;  Service: Urology;  Laterality: Left;  . Cardiac surgery       Allergies  Allergen Reactions  . Prednisone Hypertension    Tachycardia       Family History  Problem Relation Age of Onset  . Diabetes Mother   . Stroke Mother   . Seizures Other   . Cancer Other   . Diabetes Other      Social History Ms. Saulnier reports that she has been smoking Cigarettes.  She started smoking  about 25 years ago. She has a 6 pack-year smoking history. She has never used smokeless tobacco. Ms. Siebel reports that she does not drink alcohol.   Review of Systems CONSTITUTIONAL: No weight loss, fever, chills, weakness or fatigue.  HEENT: Eyes: No visual loss, blurred vision, double vision or yellow sclerae.No hearing loss, sneezing, congestion, runny nose or sore throat.  SKIN: No rash or itching.  CARDIOVASCULAR: per hpi RESPIRATORY: No shortness of breath, cough or sputum.  GASTROINTESTINAL: No anorexia, nausea, vomiting or diarrhea. No abdominal pain or blood.  GENITOURINARY: No burning on urination, no polyuria NEUROLOGICAL: No headache, dizziness, syncope, paralysis, ataxia, numbness or tingling in the extremities. No change in bowel or bladder control.  MUSCULOSKELETAL: No muscle, back pain, joint pain or stiffness.  LYMPHATICS: No enlarged nodes. No history of splenectomy.  PSYCHIATRIC: No history of depression or anxiety.  ENDOCRINOLOGIC: No reports of sweating, cold or heat intolerance. No polyuria or polydipsia.  Marland Kitchen   Physical Examination Filed Vitals:   02/29/16 0816  BP: 110/78  Pulse: 95   Filed Vitals:   02/29/16 0816  Height: 5' 2.5" (1.588 m)  Weight: 196 lb (88.905 kg)    Gen: resting comfortably, no acute distress HEENT: no scleral icterus, pupils equal round and reactive, no palptable cervical adenopathy,  CV: RRR, no m/r/g, no jvd Resp: Clear to auscultation bilaterally GI: abdomen is soft, non-tender, non-distended, normal bowel sounds, no hepatosplenomegaly MSK: extremities are warm, no edema.  Skin: warm, no rash Neuro:  no focal deficits Psych: appropriate affect   Diagnostic Studies Jan 2016 Cath PCI Note: She was given an additional 5000 units IV heparin x 1. ACT was over 200. She was given Brilinta 180 mg po x 1.   Lesion # 1 Distal RCA: JR4 guiding catheter used to engage the RCA. Cougar IC wire down the RCA. 2.0 x 15 mm balloon x 1 for  pre-dilatation. 2.25 x 16 mm Promus Premier DES x 1 distal RCA post-dilated with a 2.5 x 12 mm [place] balloon x 1. Stenosis taken from 90% down to 0%.   Lesion # 2 mid RCA: 2.0 x 15 mm balloon x 1. 3.0 x 12 mm Promus Premier DES x 1. The mid stenosis was followed by an aneurysmal segment with a size mismatch before the distal vessel so stents were not overlapped. Post-dilatation with a 3.5 x 8 mm  balloon x 1. Stenosis taken from 95% down to 0%.   Lesion #3 mid LAD: XB LAD 3.5 guiding catheter to engage left main. 2.5 x 12 mm balloon x 1 for pre-dilatation. 3.5  x 16 mm Promus Premier DES x 1 mid LAD. Stent post-dilated with a 3.5 x 8 mm [place] balloon x 2. Stenosis taken from 80% down to 0%.  The sheath was removed from the right radial artery and a Terumo hemostasis band was applied at the arteriotomy site on the right wrist. There were no immediate complications. The patient was taken to the recovery area in stable condition.   Hemodynamic Findings: Central aortic pressure: 115/66 Left ventricular pressure: 121/3/14  Angiographic Findings:  Left main: No obstructive disease.   Left Anterior Descending Artery: Large caliber vessel that courses to the apex. 80% mid stenosis. Small caliber diagonal Arun Herrod. The distal LAD tapers to a small caliber vessel.   Circumflex Artery: Large caliber vessel with 20% proximal stenosis.   Right Coronary Artery: Large dominant vessel with diffuse 30% proximal stenosis, 95% mid stenosis followed by an aneurysmal segment. Distal 90% stenosis. Diffuse mild plaque in the posterolateral artery and PDA.   Left Ventricular Angiogram: LVEF=55-60%.   Impression: 1. Severe double vessel CAD 2. NSTEMI 3. Normal LV systolic function 4. Successful PTCA/DES x 1 mid RCA and PTCA/DES x 1 distal RCA (stents not overlapped due to size mismatch in the vessel and the presence of an aneurysmal segment in the distal segment of the mid vessel) 5. Successful PTCA/DES x 1 mid  LAD  Recommendations: Will need dual anti-platelet therapy with ASA and Brilinta for at least one year. Continue beta blocker and statin. Tobacco cessation.    04/2015 echo Study Conclusions  - Left ventricle: The cavity size was normal. Wall thickness was increased in a pattern of mild LVH. Systolic function was normal. The estimated ejection fraction was in the range of 55% to 60%. Wall motion was normal; there were no regional wall motion abnormalities. Left ventricular diastolic function parameters were normal. - Aortic valve: Mildly calcified annulus. Trileaflet; mildly thickened leaflets. Valve area (VTI): 2.07 cm^2. Valve area (Vmax): 2.2 cm^2. - Atrial septum: No defect or patent foramen ovale was identified. - Technically adequate study.        Assessment and Plan   1. CAD - history of NSTEMI, s/p stenting to RCA and LAD - recent episodes of chest pain that are atypical, recent DSE negative for ischemia. EKG in clinic without ischemia - continue to monitor at this time, continue current meds.   2. HTN - at goal, we will continue current meds  3. Hyperlipidemia - continue statin. Request labs from pcp  F/u 6 months     Antoine PocheJonathan F. Kyi Romanello, M.D.

## 2016-06-06 ENCOUNTER — Encounter: Payer: Self-pay | Admitting: Adult Health

## 2016-06-06 ENCOUNTER — Ambulatory Visit (INDEPENDENT_AMBULATORY_CARE_PROVIDER_SITE_OTHER): Payer: Medicare Other | Admitting: Adult Health

## 2016-06-06 VITALS — BP 110/76 | HR 99 | Ht 62.5 in | Wt 198.0 lb

## 2016-06-06 DIAGNOSIS — I2510 Atherosclerotic heart disease of native coronary artery without angina pectoris: Secondary | ICD-10-CM | POA: Diagnosis not present

## 2016-06-06 DIAGNOSIS — I10 Essential (primary) hypertension: Secondary | ICD-10-CM

## 2016-06-06 NOTE — Patient Instructions (Signed)
Your physician wants you to follow-up in: 6 Months with Dr. Branch. You will receive a reminder letter in the mail two months in advance. If you don't receive a letter, please call our office to schedule the follow-up appointment.  Your physician recommends that you continue on your current medications as directed. Please refer to the Current Medication list given to you today.  If you need a refill on your cardiac medications before your next appointment, please call your pharmacy.  Thank you for choosing [facility]!   

## 2016-06-06 NOTE — Progress Notes (Signed)
Cardiology Office Note   Date:  06/06/2016   ID:  Tanya Lowe, DOB [birth year], MRN [id number]  PCP:  [facility]  Cardiologist: Branch/  Joni Reining, NP   Chief Complaint  Patient presents with  . Coronary Artery Disease  . Hypertension      History of Present Illness: Tanya Lowe is a 45 y.o. female who presents for ongoing assessment and management of CAD with hx of NSTEMI in 10/2014, DES to the mid RCA and mid LAD, hypertension, hyperlipidemia, and ongoing tobacco abuse. He was last seen in May of 2017 and was to be seen in 6 months. Apparently was hospitalized in [place] recently.   She states she had been put on Lyrica 100 mg BID by PCP. On the second day of taking it, she has a syncopal episode, feel to the ground thinking she was sitting, had severe chest pressure and dyspnea. Daughter called EMS and was brought to [facility]. She was treated with ASA and NTG. Stress test was completed. She was ruled out for ACS. It was felt to be a reaction to Lyrica. Since stopping the medication she has not had any more episodes.   Her angina equivalent is bilateral jaw pain with her most recent MI. She did not have any symptoms of that when she was taken to the hospital We have requested records of hospitalization and stress test.   Past Medical History:  Diagnosis Date  . Arthritis   . Bilateral hand pain   . Cervical radiculopathy   . Chronic neck and back pain   . Fibromyalgia   . Hypertension   . Kidney stones    hx of w previous lithotripsy  . MI (myocardial infarction) (HCC)   . Pain management    [facility]  . Thoracic back pain     Past Surgical History:  Procedure Laterality Date  . CARDIAC SURGERY    . CYSTOSCOPY WITH RETROGRADE PYELOGRAM, URETEROSCOPY AND STENT PLACEMENT Left 03/14/2015   Procedure: CYSTOSCOPY WITH LEFT RETROGRADE PYELOGRAM;  Surgeon: Alfredo Martinez, MD;  Location: [facility] ORS;  Service: Urology;  Laterality: Left;  .  CYSTOSCOPY WITH RETROGRADE PYELOGRAM, URETEROSCOPY AND STENT PLACEMENT Left 03/20/2015   Procedure: CYSTOSCOPY WITH LEFT  RETROGRADE LEFT URETEROSCOPY AND LEFT  STENT ;  Surgeon: Bjorn Pippin, MD;  Location: [facility] ORS;  Service: Urology;  Laterality: Left;  . HOLMIUM LASER APPLICATION Left 03/20/2015   Procedure: HOLMIUM LASER APPLICATION;  Surgeon: Bjorn Pippin, MD;  Location: [facility] ORS;  Service: Urology;  Laterality: Left;  . LEFT HEART CATHETERIZATION WITH CORONARY ANGIOGRAM N/A 10/25/2014   Procedure: LEFT HEART CATHETERIZATION WITH CORONARY ANGIOGRAM;  Surgeon: Kathleene Hazel, MD;  Location: [facility] CATH LAB;  Service: Cardiovascular;  Laterality: N/A;  . LITHOTRIPSY  about 2011  . SPINAL FUSION     L5-S1  . TUBAL LIGATION       Current Outpatient Prescriptions  Medication Sig Dispense Refill  . acetaminophen (TYLENOL) 500 MG tablet Take 1,000 mg by mouth every 6 (six) hours as needed for mild pain.     Marland Kitchen aspirin EC 81 MG tablet Take 81 mg by mouth daily.    Marland Kitchen atorvastatin (LIPITOR) 80 MG tablet Take 1 tablet (80 mg total) by mouth daily at 6 PM. 30 tablet 11  . busPIRone (BUSPAR) 7.5 MG tablet Take 7.5 mg by mouth 2 (two) times daily.    . DULoxetine (CYMBALTA) 60 MG capsule Take 60 mg by mouth daily.    Marland Kitchen  gabapentin (NEURONTIN) 600 MG tablet Take 1,200 mg by mouth 3 (three) times daily.    Marland Kitchen. lisinopril (PRINIVIL,ZESTRIL) 2.5 MG tablet Take 1 tablet (2.5 mg total) by mouth daily. 90 tablet 3  . metoprolol tartrate (LOPRESSOR) 25 MG tablet Take 0.5 tablets (12.5 mg total) by mouth 2 (two) times daily. 60 tablet 5  . nitroGLYCERIN (NITROSTAT) 0.4 MG SL tablet Place 1 tablet (0.4 mg total) under the tongue every 5 (five) minutes as needed for chest pain. 25 tablet 3  . omeprazole (PRILOSEC) 20 MG capsule Take 20 mg by mouth daily.    Marland Kitchen. oxyCODONE-acetaminophen (PERCOCET) 10-325 MG per tablet Take 1 tablet by mouth 3 (three) times daily as needed for pain.    Marland Kitchen. tiZANidine (ZANAFLEX) 4 MG tablet Take  4 mg by mouth every 8 (eight) hours as needed for muscle spasms.      No current facility-administered medications for this visit.     Allergies:   Prednisone    Social History:  The patient  reports that she has been smoking Cigarettes.  She started smoking about 25 years ago. She has a 6.00 pack-year smoking history. She has never used smokeless tobacco. She reports that she does not drink alcohol or use drugs.   Family History:  The patient's family history includes Cancer in her other; Diabetes in her mother and other; Seizures in her other; Stroke in her mother.    ROS: All other systems are reviewed and negative. Unless otherwise mentioned in H&P    PHYSICAL EXAM: VS:  BP 110/76   Pulse 99   Ht 5' 2.5" (1.588 m)   Wt 198 lb (89.8 kg)   SpO2 96%   BMI 35.64 kg/m  , BMI Body mass index is 35.64 kg/m. GEN: Well nourished, well developed, in no acute distress  HEENT: normal  Neck: no JVD, carotid bruits, or masses Cardiac: RRR; 1/6 systolic murmurs, no rubs, or gallops,no edema  Respiratory:  clear to auscultation bilaterally, normal work of breathing GI: soft, nontender, nondistended, + BS MS: no deformity or atrophy  Skin: warm and dry, no rash Neuro:  Strength and sensation are intact Psych: euthymic mood, full affect   Recent Labs: 07/30/2015: BUN 9; Creatinine, Ser 0.75; Hemoglobin 13.4; Platelets 254; Potassium 3.9; Sodium 140    Lipid Panel    Component Value Date/Time   CHOL 121 10/25/2014 0004   TRIG 251 (H) 10/25/2014 0004   HDL 21 (L) 10/25/2014 0004   CHOLHDL 5.8 10/25/2014 0004   VLDL 50 (H) 10/25/2014 0004   LDLCALC 50 10/25/2014 0004      Wt Readings from Last 3 Encounters:  06/06/16 198 lb (89.8 kg)  02/29/16 196 lb (88.9 kg)  11/28/15 192 lb (87.1 kg)    ASSESSMENT AND PLAN:  1.  CAD: Hx of DES to mid RCA and mid LAD. Recent hospitalization to [facility] with stress test. She was ruled out for ACS. Chest pain thought to be related  to use of Lyrica. This is now added to her allergy list. We are requesting records from recent hospitalization.   No changes in her medical regimen at this time. No further testing is planned.   2. Hypertension: BP is well controlled currently. No changes in regimen. Labs are followed by PCP. Will review recent labs from hospitalization.    Current medicines are reviewed at length with the patient today.    Labs/ tests ordered today include:  No orders of the defined types were  placed in this encounter.    Disposition:   FU with 6 months.   Signed, Joni ReiningKathryn Sudie Bandel, NP  06/06/2016 2:20 PM    [facility] [address], [place], [place] [address] Phone: [phone]; Fax: [phone]

## 2016-06-06 NOTE — Progress Notes (Signed)
Name: Tanya Lowe    DOB: 07/06/1971  Age: 45 y.o.  MR#: [id number]       [facility]:  [facility]      Insurance: Payor: MEDICARE / Plan: MEDICARE PART A AND B / Product Type: *No Product type* /   CC:   No chief complaint on file.   VS Vitals:   06/06/16 1359  BP: 110/76  Pulse: 99  SpO2: 96%  Weight: 198 lb (89.8 kg)  Height: 5' 2.5" (1.588 m)    Weights Current Weight  06/06/16 198 lb (89.8 kg)  02/29/16 196 lb (88.9 kg)  11/28/15 192 lb (87.1 kg)    Blood Pressure  BP Readings from Last 3 Encounters:  06/06/16 110/76  02/29/16 110/78  11/28/15 112/78     Admit date:  (Not on file) Last encounter with RMR:  Visit date not found   Allergy Prednisone  Current Outpatient Prescriptions  Medication Sig Dispense Refill  . acetaminophen (TYLENOL) 500 MG tablet Take 1,000 mg by mouth every 6 (six) hours as needed for mild pain.     Marland Kitchen. aspirin EC 81 MG tablet Take 81 mg by mouth daily.    Marland Kitchen. atorvastatin (LIPITOR) 80 MG tablet Take 1 tablet (80 mg total) by mouth daily at 6 PM. 30 tablet 11  . busPIRone (BUSPAR) 7.5 MG tablet Take 7.5 mg by mouth 2 (two) times daily.    . DULoxetine (CYMBALTA) 60 MG capsule Take 60 mg by mouth daily.    Marland Kitchen. gabapentin (NEURONTIN) 600 MG tablet Take 1,200 mg by mouth 3 (three) times daily.    Marland Kitchen. lisinopril (PRINIVIL,ZESTRIL) 2.5 MG tablet Take 1 tablet (2.5 mg total) by mouth daily. 90 tablet 3  . metoprolol tartrate (LOPRESSOR) 25 MG tablet Take 0.5 tablets (12.5 mg total) by mouth 2 (two) times daily. 60 tablet 5  . nitroGLYCERIN (NITROSTAT) 0.4 MG SL tablet Place 1 tablet (0.4 mg total) under the tongue every 5 (five) minutes as needed for chest pain. 25 tablet 3  . omeprazole (PRILOSEC) 20 MG capsule Take 20 mg by mouth daily.    Marland Kitchen. oxyCODONE-acetaminophen (PERCOCET) 10-325 MG per tablet Take 1 tablet by mouth 3 (three) times daily as needed for pain.    Marland Kitchen. tiZANidine (ZANAFLEX) 4 MG tablet Take 4 mg by mouth every 8 (eight)  hours as needed for muscle spasms.      No current facility-administered medications for this visit.     Discontinued Meds:    Medications Discontinued During This Encounter  Medication Reason  . varenicline (CHANTIX) 1 MG tablet Error    Patient Active Problem List   Diagnosis Date Noted  . Ureteral stone 03/14/2015  . NSTEMI (non-ST elevated myocardial infarction) (HCC) 10/25/2014  . Chest pain 10/24/2014  . Obesity 10/24/2014  . Tobacco abuse 10/24/2014    LABS    Component Value Date/Time   NA 140 07/30/2015 1218   NA 141 03/15/2015 0440   NA 137 03/14/2015 0020   K 3.9 07/30/2015 1218   K 3.6 03/15/2015 0440   K 3.4 (L) 03/14/2015 0020   CL 106 07/30/2015 1218   CL 107 03/15/2015 0440   CL 102 03/14/2015 0020   CO2 27 07/30/2015 1218   CO2 26 03/15/2015 0440   CO2 23 03/14/2015 0020   GLUCOSE 132 (H) 07/30/2015 1218   GLUCOSE 124 (H) 03/15/2015 0440   GLUCOSE 112 (H) 03/14/2015 0020   BUN 9 07/30/2015 1218  BUN 8 03/15/2015 0440   BUN 8 03/14/2015 0020   CREATININE 0.75 07/30/2015 1218   CREATININE 0.74 03/15/2015 0440   CREATININE 0.67 03/14/2015 0020   CALCIUM 8.6 (L) 07/30/2015 1218   CALCIUM 8.4 (L) 03/15/2015 0440   CALCIUM 8.8 (L) 03/14/2015 0020   GFRNONAA >60 07/30/2015 1218   GFRNONAA >60 03/15/2015 0440   GFRNONAA >60 03/14/2015 0020   GFRAA >60 07/30/2015 1218   GFRAA >60 03/15/2015 0440   GFRAA >60 03/14/2015 0020   CMP     Component Value Date/Time   NA 140 07/30/2015 1218   K 3.9 07/30/2015 1218   CL 106 07/30/2015 1218   CO2 27 07/30/2015 1218   GLUCOSE 132 (H) 07/30/2015 1218   BUN 9 07/30/2015 1218   CREATININE 0.75 07/30/2015 1218   CALCIUM 8.6 (L) 07/30/2015 1218   PROT 7.2 03/14/2015 0020   ALBUMIN 3.3 (L) 03/14/2015 0020   AST 28 03/14/2015 0020   ALT 22 03/14/2015 0020   ALKPHOS 95 03/14/2015 0020   BILITOT 0.4 03/14/2015 0020   GFRNONAA >60 07/30/2015 1218   GFRAA >60 07/30/2015 1218       Component Value  Date/Time   WBC 8.5 07/30/2015 1218   WBC 4.9 03/15/2015 0440   WBC 10.0 03/14/2015 0020   HGB 13.4 07/30/2015 1218   HGB 10.5 (L) 03/15/2015 0440   HGB 12.0 03/14/2015 0020   HCT 40.7 07/30/2015 1218   HCT 32.1 (L) 03/15/2015 0440   HCT 36.8 03/14/2015 0020   MCV 86.4 07/30/2015 1218   MCV 86.5 03/15/2015 0440   MCV 85.8 03/14/2015 0020    Lipid Panel     Component Value Date/Time   CHOL 121 10/25/2014 0004   TRIG 251 (H) 10/25/2014 0004   HDL 21 (L) 10/25/2014 0004   CHOLHDL 5.8 10/25/2014 0004   VLDL 50 (H) 10/25/2014 0004   LDLCALC 50 10/25/2014 0004    ABG No results found for: PHART, PCO2ART, PO2ART, HCO3, TCO2, ACIDBASEDEF, O2SAT   Lab Results  Component Value Date   TSH 2.092 10/24/2014   BNP (last 3 results) No results for input(s): BNP in the last 8760 hours.  ProBNP (last 3 results) No results for input(s): PROBNP in the last 8760 hours.  Cardiac Panel (last 3 results) No results for input(s): CKTOTAL, CKMB, TROPONINI, RELINDX in the last 72 hours.  Iron/TIBC/Ferritin/ %Sat No results found for: IRON, TIBC, FERRITIN, IRONPCTSAT   EKG Orders placed or performed in visit on 02/29/16  . EKG 12-Lead  . EKG 12-Lead     Prior Assessment and Plan Problem List as of 06/06/2016 Reviewed: 03/02/2016  7:05 PM by Dina RichBranch, Jonathan, MD     Cardiovascular and Mediastinum   NSTEMI (non-ST elevated myocardial infarction) [facility](HCC)     Genitourinary   Ureteral stone     Other   Chest pain   Obesity   Tobacco abuse       Imaging: No results found.

## 2016-11-25 ENCOUNTER — Ambulatory Visit (INDEPENDENT_AMBULATORY_CARE_PROVIDER_SITE_OTHER): Payer: Medicare Other | Admitting: Neurology

## 2016-11-25 ENCOUNTER — Encounter: Payer: Self-pay | Admitting: Neurology

## 2016-11-25 VITALS — BP 168/96 | HR 88 | Resp 20 | Ht 62.0 in | Wt 199.0 lb

## 2016-11-25 DIAGNOSIS — R519 Headache, unspecified: Secondary | ICD-10-CM

## 2016-11-25 DIAGNOSIS — R0683 Snoring: Secondary | ICD-10-CM

## 2016-11-25 DIAGNOSIS — G4761 Periodic limb movement disorder: Secondary | ICD-10-CM

## 2016-11-25 DIAGNOSIS — E669 Obesity, unspecified: Secondary | ICD-10-CM

## 2016-11-25 DIAGNOSIS — F1190 Opioid use, unspecified, uncomplicated: Secondary | ICD-10-CM

## 2016-11-25 DIAGNOSIS — F17200 Nicotine dependence, unspecified, uncomplicated: Secondary | ICD-10-CM | POA: Diagnosis not present

## 2016-11-25 DIAGNOSIS — R400 Somnolence: Secondary | ICD-10-CM | POA: Diagnosis not present

## 2016-11-25 DIAGNOSIS — R51 Headache: Secondary | ICD-10-CM | POA: Diagnosis not present

## 2016-11-25 DIAGNOSIS — G2581 Restless legs syndrome: Secondary | ICD-10-CM

## 2016-11-25 NOTE — Progress Notes (Signed)
Subjective:    Patient ID: Tanya Lowe is a 46 y.o. female.  HPI     Huston Foley, MD, PhD [facility] [address] [address] [place], [place] [address]  Dear Dr. Murray Hodgkins,   I saw your patient, Tanya Lowe, upon your kind request in my neurologic clinic today for initial consultation of her sleep disorder, in particular, concern for underlying obstructive sleep apnea. The patient is unaccompanied today. As you know, [name] is a 46 year old right-handed woman with an underlying medical history of smoking, low back pain, on chronic narcotic pain medication, fibromyalgia, anxiety, and obesity, who reports snoring and excessive daytime somnolence. I reviewed her office note from 10/30/2016, which you kindly included. She has undergone epidural steroid injections. She avoids taking opioids after 6 PM as instructed. She smokes one pack per day, she is trying to quit, she endorses stress. She is on disability. She used to work as a Administrator, sports. She has 2 grown children. She lives with her boyfriend. She drinks no alcohol, 1 serving of caffeine per day. Bedtime is around 10 PM. She watches TV in bed and puts the TV on a timer. Wakeup time is typically between 4 and 5 AM. She does not sleep longer than this typically. She denies night to night nocturia but has occasional morning headaches. Of note, her mother and her sister both have sleep apnea and uses CPAP machines. She endorses restless leg symptoms and feels that the combination of gabapentin and Cymbalta has been helpful for this. She has a history of leg twitching in her sleep and is a light sleeper. Of note, she takes Gabapentin 1200 mg tid, Cymbalta 60 mg once daily, buspar 7.5 mg bid, Zanaflex 4 mg tid.   Her Epworth sleepiness score is 6 out of 24 today, her fatigue score is 14 out of 63.  Her Past Medical History Is Significant For: Past Medical History:  Diagnosis Date  . Arthritis   . Bilateral  hand pain   . Cervical radiculopathy   . Chronic neck and back pain   . Fibromyalgia   . Hypertension   . Kidney stones    hx of w previous lithotripsy  . MI (myocardial infarction)   . Pain management    [facility]  . Thoracic back pain     Her Past Surgical History Is Significant For: Past Surgical History:  Procedure Laterality Date  . CARDIAC SURGERY    . CYSTOSCOPY WITH RETROGRADE PYELOGRAM, URETEROSCOPY AND STENT PLACEMENT Left 03/14/2015   Procedure: CYSTOSCOPY WITH LEFT RETROGRADE PYELOGRAM;  Surgeon: Alfredo Martinez, MD;  Location: [facility];  Service: Urology;  Laterality: Left;  . CYSTOSCOPY WITH RETROGRADE PYELOGRAM, URETEROSCOPY AND STENT PLACEMENT Left 03/20/2015   Procedure: CYSTOSCOPY WITH LEFT  RETROGRADE LEFT URETEROSCOPY AND LEFT  STENT ;  Surgeon: Bjorn Pippin, MD;  Location: [facility];  Service: Urology;  Laterality: Left;  . HOLMIUM LASER APPLICATION Left 03/20/2015   Procedure: HOLMIUM LASER APPLICATION;  Surgeon: Bjorn Pippin, MD;  Location: [facility];  Service: Urology;  Laterality: Left;  . LEFT HEART CATHETERIZATION WITH CORONARY ANGIOGRAM N/A 10/25/2014   Procedure: LEFT HEART CATHETERIZATION WITH CORONARY ANGIOGRAM;  Surgeon: Kathleene Hazel, MD;  Location: [facility] CATH LAB;  Service: Cardiovascular;  Laterality: N/A;  . LITHOTRIPSY  about 2011  . SPINAL FUSION     L5-S1  . TUBAL LIGATION      Her Family History Is Significant For: Family History  Problem Relation Age  of Onset  . Diabetes Mother   . Stroke Mother   . Seizures Other   . Cancer Other   . Diabetes Other     Her Social History Is Significant For: Social History   Social History  . Marital status: Divorced    Spouse name: N/A  . Number of children: N/A  . Years of education: N/A   Social History Main Topics  . Smoking status: Current Every Day Smoker    Packs/day: 0.25    Years: 24.00    Types: Cigarettes    Start date: 10/25/1990  . Smokeless tobacco: Never Used  . Alcohol use No  . Drug  use: No  . Sexual activity: Not Asked   Other Topics Concern  . None   Social History Narrative  . None    Her Allergies Are:  Allergies  Allergen Reactions  . Lyrica [Pregabalin] Shortness Of Breath    Chest Pain   . Prednisone Hypertension    Tachycardia   :   Her Current Medications Are:  Outpatient Encounter Prescriptions as of 11/25/2016  Medication Sig  . acetaminophen (TYLENOL) 500 MG tablet Take 1,000 mg by mouth every 6 (six) hours as needed for mild pain.   Marland Kitchen. aspirin EC 81 MG tablet Take 81 mg by mouth daily.  Marland Kitchen. atorvastatin (LIPITOR) 80 MG tablet Take 1 tablet (80 mg total) by mouth daily at 6 PM.  . busPIRone (BUSPAR) 7.5 MG tablet Take 7.5 mg by mouth 2 (two) times daily.  . DULoxetine (CYMBALTA) 60 MG capsule Take 60 mg by mouth daily.  Marland Kitchen. gabapentin (NEURONTIN) 600 MG tablet Take 1,200 mg by mouth 3 (three) times daily.  Marland Kitchen. lisinopril (PRINIVIL,ZESTRIL) 2.5 MG tablet Take 1 tablet (2.5 mg total) by mouth daily.  . metoprolol tartrate (LOPRESSOR) 25 MG tablet Take 0.5 tablets (12.5 mg total) by mouth 2 (two) times daily.  . nitroGLYCERIN (NITROSTAT) 0.4 MG SL tablet Place 1 tablet (0.4 mg total) under the tongue every 5 (five) minutes as needed for chest pain.  Marland Kitchen. omeprazole (PRILOSEC) 20 MG capsule Take 20 mg by mouth daily.  Marland Kitchen. oxyCODONE-acetaminophen (PERCOCET) 10-325 MG per tablet Take 1 tablet by mouth 3 (three) times daily as needed for pain.  Marland Kitchen. tiZANidine (ZANAFLEX) 4 MG tablet Take 4 mg by mouth every 8 (eight) hours as needed for muscle spasms.    No facility-administered encounter medications on file as of 11/25/2016.   :  Review of Systems:  Out of a complete 14 point review of systems, all are reviewed and negative with the exception of these symptoms as listed below:  Review of Systems  Eyes: Positive for visual disturbance.  Musculoskeletal: Positive for arthralgias and myalgias.  Neurological: Positive for weakness, numbness and headaches.       Pt  presents today to discuss her sleep. Pt has never had a sleep study but does endorse snoring.  Epworth Sleepiness Scale 0= would never doze 1= slight chance of dozing 2= moderate chance of dozing 3= high chance of dozing  Sitting and reading: 2 Watching TV: 2 Sitting inactive in a public place (ex. Theater or meeting): 0 As a passenger in a car for an hour without a break: 0 Lying down to rest in the afternoon: 2 Sitting and talking to someone: 0 Sitting quietly after lunch (no alcohol): 0 In a car, while stopped in traffic: 0 Total: 6   Psychiatric/Behavioral: The patient is nervous/anxious.     Objective:  Neurologic Exam  Physical Exam Physical Examination:   Vitals:   11/25/16 1551  BP: (!) 168/96  Pulse: 88  Resp: 20    General Examination: The patient is a very pleasant 46 y.o. female in no acute distress. She appears well-developed and well-nourished and well groomed.   HEENT: Normocephalic, atraumatic, pupils are equal, round and reactive to light and accommodation. Funduscopic exam is normal with sharp disc margins noted. Extraocular tracking is good without limitation to gaze excursion or nystagmus noted. Normal smooth pursuit is noted. Hearing is grossly intact. Tympanic membranes are clear bilaterally. Face is symmetric with normal facial animation and normal facial sensation. Speech is clear with no dysarthria noted. There is no hypophonia. There is no lip, neck/head, jaw or voice tremor. Neck is supple with full range of passive and active motion. There are no carotid bruits on auscultation. Oropharynx exam reveals: moderate mouth dryness, adequate dental hygiene and several missing teeth on bottom and full dentures on top, .  moderate airway crowding, due to smaller airway entry, larger uvula, thicker tongue, tonsils in place of about 1+ bilaterally. Neck circumference is 16 inches.  Chest: Clear to auscultation without wheezing, rhonchi or crackles  noted.  Heart: S1+S2+0, regular and normal without murmurs, rubs or gallops noted.   Abdomen: Soft, non-tender and non-distended with normal bowel sounds appreciated on auscultation.  Extremities: There is no pitting edema in the distal lower extremities bilaterally. Pedal pulses are intact.  Skin: Warm and dry without trophic changes noted. There are no varicose veins.  Musculoskeletal: exam reveals no obvious joint deformities, tenderness or joint swelling or erythema.   Neurologically:  Mental status: The patient is awake, alert and oriented in all 4 spheres. Her immediate and remote memory, attention, language skills and fund of knowledge are appropriate. There is no evidence of aphasia, agnosia, apraxia or anomia. Speech is clear with normal prosody and enunciation. Thought process is linear. Mood is normal and affect is normal.  Cranial nerves II - XII are as described above under HEENT exam. In addition: shoulder shrug is normal with equal shoulder height noted. Motor exam: Normal bulk, strength and tone is noted. There is no drift, tremor or rebound. Romberg is negative. Reflexes are 2+ throughout. Fine motor skills and coordination: intact with normal finger taps, normal hand movements, normal rapid alternating patting, normal foot taps and normal foot agility.  Cerebellar testing: No dysmetria or intention tremor on finger to nose testing. Heel to shin is unremarkable bilaterally. There is no truncal or gait ataxia.  Sensory exam: intact to light touch, pinprick, vibration, temperature sense in the upper and lower extremities.  Gait, station and balance: She stands with mild difficulty. No veering to one side is noted. No leaning to one side is noted. Posture is age-appropriate and stance is narrow based. Gait shows normal stride length and normal pace. No problems turning are noted. She cannot do tandem walk.   Assessment and Plan:  In summary, Wednesday M League is a very pleasant 46  y.o.-year old female with an underlying medical history of smoking, low back pain, on chronic narcotic pain medication, fibromyalgia, anxiety, and obesity, whose history and physical exam are concerning for obstructive sleep apnea (OSA). Of note, the due to narcotic pain medication intake increases her risk for sleep disordered breathing and her ongoing smoking increases her risk for significant sleep-related desaturations. In addition, she endorses restless leg symptoms and leg twitching at night.  I had a long chat with the patient about my  findings and the diagnosis of OSA, its prognosis and treatment options. We talked about medical treatments, surgical interventions and non-pharmacological approaches. I explained in particular the risks and ramifications of untreated moderate to severe OSA, especially with respect to developing cardiovascular disease down the Road, including congestive heart failure, difficult to treat hypertension, cardiac arrhythmias, or stroke. Even type 2 diabetes has, in part, been linked to untreated OSA. Symptoms of untreated OSA include daytime sleepiness, memory problems, mood irritability and mood disorder such as depression and anxiety, lack of energy, as well as recurrent headaches, especially morning headaches. We talked about smoking cessation and trying to maintain a healthy lifestyle in general, as well as the importance of weight control. I encouraged the patient to eat healthy, exercise daily and keep well hydrated, to keep a scheduled bedtime and wake time routine, to not skip any meals and eat healthy snacks in between meals. I advised the patient not to drive when feeling sleepy. I recommended the following at this time: sleep study with potential positive airway pressure titration. (We will score hypopneas at 4%).   I explained the sleep test procedure to the patient and also outlined possible surgical and non-surgical treatment options of OSA, including the use of a  custom-made dental device (which would require a referral to a specialist dentist or oral surgeon), upper airway surgical options, such as pillar implants, radiofrequency surgery, tongue base surgery, and UPPP (which would involve a referral to an ENT surgeon). Rarely, jaw surgery such as mandibular advancement may be considered.  I also explained the CPAP treatment option to the patient, who indicated that she would be willing to try CPAP if the need arises. I explained the importance of being compliant with PAP treatment, not only for insurance purposes but primarily to improve Her symptoms, and for the patient's long term health benefit, including to reduce Her cardiovascular risks. I answered all her questions today and the patient was in agreement. I would like to see her back after the sleep study is completed and encouraged her to call with any interim questions, concerns, problems or updates.   Thank you very much for allowing me to participate in the care of this nice patient. If I can be of any further assistance to you please do not hesitate to call me at [phone].  Sincerely,   Huston Foley, MD, PhD

## 2016-11-25 NOTE — Patient Instructions (Signed)

## 2016-12-09 ENCOUNTER — Encounter: Payer: Self-pay | Admitting: Cardiology

## 2016-12-09 ENCOUNTER — Ambulatory Visit (INDEPENDENT_AMBULATORY_CARE_PROVIDER_SITE_OTHER): Payer: Medicare Other | Admitting: Cardiology

## 2016-12-09 VITALS — BP 104/64 | HR 72 | Ht 62.0 in | Wt 201.0 lb

## 2016-12-09 DIAGNOSIS — E782 Mixed hyperlipidemia: Secondary | ICD-10-CM

## 2016-12-09 DIAGNOSIS — I10 Essential (primary) hypertension: Secondary | ICD-10-CM

## 2016-12-09 DIAGNOSIS — I2510 Atherosclerotic heart disease of native coronary artery without angina pectoris: Secondary | ICD-10-CM

## 2016-12-09 NOTE — Progress Notes (Signed)
Clinical Summary Ms. Jahnke is a 46 y.o.female seen today for follow up of the following medical problems.   1. CAD - history of NSTEMI Jan 2016, s/p DES to mid RCA and DES to distal RCA and DES to mid LAD.  - 04/2015 echo LVEF 55-60% -11/2015 DSE without ischemia - reports some chest pains yesterday. Dull pain midchest, 3/10. Occurs at rest or activity. Lasts for just few minutes. No other associated symptoms. Overall unchanged from last visit Not positional.  - compliant with meds.   - she reports recent stress test in [place] that was negative.  - just occasional symptoms since last visit. Occasional SOB/DOE. No significnat chest pain.     2. HTN - she remains compliant with meds - does not check bp at home regularly.   3. Hyperlipidemia - compliant with statin - she reports having recent  labs with pcp    Past Medical History:  Diagnosis Date  . Arthritis   . Bilateral hand pain   . Cervical radiculopathy   . Chronic neck and back pain   . Fibromyalgia   . Hypertension   . Kidney stones    hx of w previous lithotripsy  . MI (myocardial infarction)   . Pain management    [facility]  . Thoracic back pain      Allergies  Allergen Reactions  . Lyrica [Pregabalin] Shortness Of Breath    Chest Pain   . Prednisone Hypertension    Tachycardia      Current Outpatient Prescriptions  Medication Sig Dispense Refill  . acetaminophen (TYLENOL) 500 MG tablet Take 1,000 mg by mouth every 6 (six) hours as needed for mild pain.     Marland Kitchen aspirin EC 81 MG tablet Take 81 mg by mouth daily.    Marland Kitchen atorvastatin (LIPITOR) 80 MG tablet Take 1 tablet (80 mg total) by mouth daily at 6 PM. 30 tablet 11  . busPIRone (BUSPAR) 7.5 MG tablet Take 7.5 mg by mouth 2 (two) times daily.    . DULoxetine (CYMBALTA) 60 MG capsule Take 60 mg by mouth daily.    Marland Kitchen gabapentin (NEURONTIN) 600 MG tablet Take 1,200 mg by mouth 3 (three) times daily.    Marland Kitchen lisinopril (PRINIVIL,ZESTRIL) 2.5 MG tablet  Take 1 tablet (2.5 mg total) by mouth daily. 90 tablet 3  . metoprolol tartrate (LOPRESSOR) 25 MG tablet Take 0.5 tablets (12.5 mg total) by mouth 2 (two) times daily. 60 tablet 5  . nitroGLYCERIN (NITROSTAT) 0.4 MG SL tablet Place 1 tablet (0.4 mg total) under the tongue every 5 (five) minutes as needed for chest pain. 25 tablet 3  . omeprazole (PRILOSEC) 20 MG capsule Take 20 mg by mouth daily.    Marland Kitchen oxyCODONE-acetaminophen (PERCOCET) 10-325 MG per tablet Take 1 tablet by mouth 3 (three) times daily as needed for pain.    Marland Kitchen tiZANidine (ZANAFLEX) 4 MG tablet Take 4 mg by mouth every 8 (eight) hours as needed for muscle spasms.      No current facility-administered medications for this visit.      Past Surgical History:  Procedure Laterality Date  . CARDIAC SURGERY    . CYSTOSCOPY WITH RETROGRADE PYELOGRAM, URETEROSCOPY AND STENT PLACEMENT Left 03/14/2015   Procedure: CYSTOSCOPY WITH LEFT RETROGRADE PYELOGRAM;  Surgeon: Alfredo Martinez, MD;  Location: [facility];  Service: Urology;  Laterality: Left;  . CYSTOSCOPY WITH RETROGRADE PYELOGRAM, URETEROSCOPY AND STENT PLACEMENT Left 03/20/2015   Procedure: CYSTOSCOPY WITH LEFT  RETROGRADE LEFT URETEROSCOPY  AND LEFT  STENT ;  Surgeon: Bjorn PippinJohn Wrenn, MD;  Location: [facility];  Service: Urology;  Laterality: Left;  . HOLMIUM LASER APPLICATION Left 03/20/2015   Procedure: HOLMIUM LASER APPLICATION;  Surgeon: Bjorn PippinJohn Wrenn, MD;  Location: [facility];  Service: Urology;  Laterality: Left;  . LEFT HEART CATHETERIZATION WITH CORONARY ANGIOGRAM N/A 10/25/2014   Procedure: LEFT HEART CATHETERIZATION WITH CORONARY ANGIOGRAM;  Surgeon: Kathleene Hazelhristopher D McAlhany, MD;  [facility];  Service: Cardiovascular;  Laterality: N/A;  . LITHOTRIPSY  about 2011  . SPINAL FUSION     L5-S1  . TUBAL LIGATION       Allergies  Allergen Reactions  . Lyrica [Pregabalin] Shortness Of Breath    Chest Pain   . Prednisone Hypertension    Tachycardia       Family History  Problem  Relation Age of Onset  . Diabetes Mother   . Stroke Mother   . Seizures Other   . Cancer Other   . Diabetes Other      Social History Ms. Ottis StainHuff reports that she has been smoking Cigarettes.  She started smoking about 26 years ago. She has a 6.00 pack-year smoking history. She has never used smokeless tobacco. Ms. Ottis StainHuff reports that she does not drink alcohol.   Review of Systems CONSTITUTIONAL: No weight loss, fever, chills, weakness or fatigue.  HEENT: Eyes: No visual loss, blurred vision, double vision or yellow sclerae.No hearing loss, sneezing, congestion, runny nose or sore throat.  SKIN: No rash or itching.  CARDIOVASCULAR: per HPI RESPIRATORY: No shortness of breath, cough or sputum.  GASTROINTESTINAL: No anorexia, nausea, vomiting or diarrhea. No abdominal pain or blood.  GENITOURINARY: No burning on urination, no polyuria NEUROLOGICAL: No headache, dizziness, syncope, paralysis, ataxia, numbness or tingling in the extremities. No change in bowel or bladder control.  MUSCULOSKELETAL: No muscle, back pain, joint pain or stiffness.  LYMPHATICS: No enlarged nodes. No history of splenectomy.  PSYCHIATRIC: No history of depression or anxiety.  ENDOCRINOLOGIC: No reports of sweating, cold or heat intolerance. No polyuria or polydipsia.  Marland Kitchen.   Physical Examination Vitals:   12/09/16 1011  BP: 104/64  Pulse: 72   Vitals:   12/09/16 1011  Weight: 201 lb (91.2 kg)  Height: 5\' 2"  (1.575 m)    Gen: resting comfortably, no acute distress HEENT: no scleral icterus, pupils equal round and reactive, no palptable cervical adenopathy,  CV: RRR, no m/r/g, no jvd Resp: Clear to auscultation bilaterally GI: abdomen is soft, non-tender, non-distended, normal bowel sounds, no hepatosplenomegaly MSK: extremities are warm, no edema.  Skin: warm, no rash Neuro:  no focal deficits Psych: appropriate affect   Diagnostic Studies Jan 2016 Cath PCI Note: She was given an additional 5000  units IV heparin x 1. ACT was over 200. She was given Brilinta 180 mg po x 1.   Lesion # 1 Distal RCA: JR4 guiding catheter used to engage the RCA. Cougar IC wire down the RCA. 2.0 x 15 mm balloon x 1 for pre-dilatation. 2.25 x 16 mm Promus Premier DES x 1 distal RCA post-dilated with a 2.5 x 12 mm [place] balloon x 1. Stenosis taken from 90% down to 0%.   Lesion # 2 mid RCA: 2.0 x 15 mm balloon x 1. 3.0 x 12 mm Promus Premier DES x 1. The mid stenosis was followed by an aneurysmal segment with a size mismatch before the distal vessel so stents were not overlapped. Post-dilatation with a 3.5 x 8 mm  [place] balloon x 1. Stenosis taken from 95% down to 0%.   Lesion #3 mid LAD: XB LAD 3.5 guiding catheter to engage left main. 2.5 x 12 mm balloon x 1 for pre-dilatation. 3.5 x 16 mm Promus Premier DES x 1 mid LAD. Stent post-dilated with a 3.5 x 8 mm [place] balloon x 2. Stenosis taken from 80% down to 0%.  The sheath was removed from the right radial artery and a Terumo hemostasis band was applied at the arteriotomy site on the right wrist. There were no immediate complications. The patient was taken to the recovery area in stable condition.   Hemodynamic Findings: Central aortic pressure: 115/66 Left ventricular pressure: 121/3/14  Angiographic Findings:  Left main: No obstructive disease.   Left Anterior Descending Artery: Large caliber vessel that courses to the apex. 80% mid stenosis. Small caliber diagonal Danean Marner. The distal LAD tapers to a small caliber vessel.   Circumflex Artery: Large caliber vessel with 20% proximal stenosis.   Right Coronary Artery: Large dominant vessel with diffuse 30% proximal stenosis, 95% mid stenosis followed by an aneurysmal segment. Distal 90% stenosis. Diffuse mild plaque in the posterolateral artery and PDA.   Left Ventricular Angiogram: LVEF=55-60%.   Impression: 1. Severe double vessel CAD 2. NSTEMI 3. Normal LV systolic function 4. Successful  PTCA/DES x 1 mid RCA and PTCA/DES x 1 distal RCA (stents not overlapped due to size mismatch in the vessel and the presence of an aneurysmal segment in the distal segment of the mid vessel) 5. Successful PTCA/DES x 1 mid LAD  Recommendations: Will need dual anti-platelet therapy with ASA and Brilinta for at least one year. Continue beta blocker and statin. Tobacco cessation.    04/2015 echo Study Conclusions  - Left ventricle: The cavity size was normal. Wall thickness was increased in a pattern of mild LVH. Systolic function was normal. The estimated ejection fraction was in the range of 55% to 60%. Wall motion was normal; there were no regional wall motion abnormalities. Left ventricular diastolic function parameters were normal. - Aortic valve: Mildly calcified annulus. Trileaflet; mildly thickened leaflets. Valve area (VTI): 2.07 cm^2. Valve area (Vmax): 2.2 cm^2. - Atrial septum: No defect or patent foramen ovale was identified. - Technically adequate study.     Assessment and Plan  1. CAD - history of NSTEMI, s/p stenting to RCA and LAD - recent episodes of chest pain that are atypical, recent stress tests have been negative.  - no recurrent symptoms at this time, continue to monitor.   2. HTN - she is at goal, we will continue current meds  3. Hyperlipidemia - she will continue statin. We will request labs from pcp  F/u 6 months      Antoine Poche, M.D.

## 2016-12-09 NOTE — Patient Instructions (Signed)
Medication Instructions:  Your physician recommends that you continue on your current medications as directed. Please refer to the Current Medication list given to you today.   Labwork: I will request a copy of your labs from pcp  Testing/Procedures: none  Follow-Up: Your physician wants you to follow-up in: 6 months .  You will receive a reminder letter in the mail two months in advance. If you don't receive a letter, please call our office to schedule the follow-up appointment.   Any Other Special Instructions Will Be Listed Below (If Applicable).     If you need a refill on your cardiac medications before your next appointment, please call your pharmacy.

## 2016-12-11 ENCOUNTER — Ambulatory Visit (INDEPENDENT_AMBULATORY_CARE_PROVIDER_SITE_OTHER): Payer: Medicare Other | Admitting: Neurology

## 2016-12-11 DIAGNOSIS — G4720 Circadian rhythm sleep disorder, unspecified type: Secondary | ICD-10-CM

## 2016-12-11 DIAGNOSIS — G4733 Obstructive sleep apnea (adult) (pediatric): Secondary | ICD-10-CM | POA: Diagnosis not present

## 2016-12-12 NOTE — Addendum Note (Signed)
Addended by: Huston FoleyATHAR, Divine Imber on: 12/12/2016 03:07 PM   Modules accepted: Orders

## 2016-12-12 NOTE — Procedures (Signed)
PATIENT'S NAME:  Tanya Lowe, [name] DOB:      03/15/1971      MR#:    [id number]     DATE OF RECORDING: 12/11/2016 REFERRING M.D.:  Callie FieldingAlbert Bartko, MD Study Performed:   Baseline Polysomnogram HISTORY: 46 year old right-handed woman with an underlying medical history of smoking, low back pain, on chronic narcotic pain medication, fibromyalgia, anxiety, and obesity, who reports snoring and excessive daytime somnolence. The patient endorsed the Epworth Sleepiness Scale at 6 points. The patient's weight 199 pounds with a height of 62 (inches), resulting in a BMI of 36.5 kg/m2. The patient's neck circumference measured 16 inches.  CURRENT MEDICATIONS: Tylenol, Aspirin, Lipitor, Buspar, Cymbalta, Neurontin, Lisinopril, Lopressor, Nitrostat, Prilosec, Percocet, Zanaflex   PROCEDURE:  This is a multichannel digital polysomnogram utilizing the Somnostar 11.2 system.  Electrodes and sensors were applied and monitored per AASM Specifications.   EEG, EOG, Chin and Limb EMG, were sampled at 200 Hz.  ECG, Snore and Nasal Pressure, Thermal Airflow, Respiratory Effort, CPAP Flow and Pressure, Oximetry was sampled at 50 Hz. Digital video and audio were recorded.      BASELINE STUDY Lights Out was at 21:01 and Lights On at 04:58.  Total recording time (TRT) was 478 minutes, with a total sleep time (TST) of  427.5 minutes.   The patient's sleep latency was 9 minutes.  REM latency was 126.5 minutes.  The sleep efficiency was 89.4 %.     SLEEP ARCHITECTURE: WASO (Wake after sleep onset) was 43 minutes with mild to moderate sleep fragmentation noted.  There were 10.5 minutes in Stage N1, 238.5 minutes Stage N2, 86 minutes Stage N3 and 92.5 minutes in Stage REM.  The percentage of Stage N1 was 2.5%, Stage N2 was 55.8%, which is normal, Stage N3 was 20.1%, which is normal and Stage R (REM sleep) was 21.6%, which is normal.   The arousals were noted as: 65 were spontaneous, 66 were associated with PLMs, 39 were associated with  respiratory events.  Audio and video analysis did not show any abnormal or unusual movements, behaviors, phonations or vocalizations; the patient stated, that she could not sleep on her back due to her back pain.  The patient took 1 bathroom break. Mild to moderate snoring was noted. The EKG was in keeping with normal sinus rhythm (NSR).  RESPIRATORY ANALYSIS:  There were a total of 77 respiratory events:  19 obstructive apneas, 3 central apneas and 4 mixed apneas with a total of 26 apneas and an apnea index (AI) of 3.6 /hour. There were 51 hypopneas with a hypopnea index of 7.2 /hour. The patient also had 1 respiratory event related arousals (RERAs).      The total APNEA/HYPOPNEA INDEX (AHI) was 10.8/hour and the total RESPIRATORY DISTURBANCE INDEX was 10.9 /hour.  15 events occurred in REM sleep and 87 events in NREM. The REM AHI was 9.7 /hour, versus a non-REM AHI of 11.1. The patient spent 24 minutes of total sleep time in the supine position and 404 minutes in non-supine.. The supine AHI was 65.0 versus a non-supine AHI of 7.6.  OXYGEN SATURATION & C02:  The Wake baseline 02 saturation was 95%, with the lowest being 86%. Time spent below 89% saturation equaled 27 minutes.  PERIODIC LIMB MOVEMENTS:   The patient had a total of 75 Periodic Limb Movements.  The Periodic Limb Movement (PLM) index was 10.5 and the PLM Arousal index was 9.3/hour. Post-study, the patient indicated that sleep was the same as usual.  IMPRESSION: 1. Obstructive Sleep Apnea (OSA) 2. Dysfunctions associated with sleep stages or arousal from sleep  RECOMMENDATIONS: 1. This study demonstrates overall mild obstructive sleep apnea, severe in supine sleep, with a total AHI of 10.8/hour, REM AHI of 9.7/hour, supine AHI of 65/hour, and O2 nadir of 86%. Of note, the absence of supine REM sleep during this study may underestimate her AHI and O2 nadir. Given the patient's medical history and sleep related complaints, a full-night  CPAP titration study is recommended to optimize therapy. Other treatment options may include avoidance of supine sleep position along with weight loss, upper airway or jaw surgery in selected patients or the use of an oral appliance in certain patients. ENT evaluation and/or consultation with a maxillofacial surgeon or dentist may be feasible and some instances.    2. Please note that untreated obstructive sleep apnea carries additional perioperative morbidity. Patients with significant obstructive sleep apnea should receive perioperative PAP therapy and the surgeons and particularly the anesthesiologist should be informed of the diagnosis and the severity of the sleep disordered breathing. 3. This study shows sleep fragmentation and abnormal sleep stage percentages; these are nonspecific findings and per se do not signify an intrinsic sleep disorder or a cause for the patient's sleep-related symptoms. Causes include (but are not limited to) the first night effect of the sleep study, circadian rhythm disturbances, medication effect or an underlying mood disorder or medical problem.  4. The patient should be cautioned not to drive, work at heights, or operate dangerous or heavy equipment when tired or sleepy. Review and reiteration of good sleep hygiene measures should be pursued with any patient. 5. The patient will be seen in follow-up by Dr. Frances Furbish at [facility] for discussion of the test results and further management strategies. The referring provider will be notified of the test results.  I certify that I have reviewed the entire raw data recording prior to the issuance of this report in accordance with the Standards of Accreditation of the American Academy of Sleep Medicine (AASM)    Huston Foley, MD, PhD Diplomat, American Board of Psychiatry and Neurology (Neurology and Sleep Medicine)

## 2016-12-12 NOTE — Progress Notes (Signed)
Patient referred by Dr. Murray HodgkinsBartko, seen by me on 11/25/16, diagnostic PSG on 12/11/16.    Please call and notify the patient that the recent sleep study did confirm the diagnosis of overall mild obstructive sleep apnea, severe in supine sleep and that I recommend treatment for this in the form of CPAP. This will require a repeat sleep study for proper titration and mask fitting. Please explain to patient and arrange for a CPAP titration study. I have placed an order in the chart. Thanks, and [facility].  Huston FoleySaima Romano Stigger, MD, PhD [facility])

## 2016-12-15 ENCOUNTER — Telehealth: Payer: Self-pay

## 2016-12-15 NOTE — Telephone Encounter (Signed)
-----   Message from Huston FoleySaima Athar, MD sent at 12/12/2016  3:07 PM EST ----- Patient referred by Dr. Murray HodgkinsBartko, seen by me on 11/25/16, diagnostic PSG on 12/11/16.    Please call and notify the patient that the recent sleep study did confirm the diagnosis of overall mild obstructive sleep apnea, severe in supine sleep and that I recommend treatment for this in the form of CPAP. This will require a repeat sleep study for proper titration and mask fitting. Please explain to patient and arrange for a CPAP titration study. I have placed an order in the chart. Thanks, and [facility].  Huston FoleySaima Athar, MD, PhD [facility])

## 2016-12-15 NOTE — Telephone Encounter (Signed)
I called pt to discuss her sleep study results. No answer, left a message asking her to call me back. 

## 2016-12-16 NOTE — Telephone Encounter (Signed)
I called pt. I advised her of her sleep study results. Pt is agreeable to a cpap titration study. Pt verbalized understanding of results. Pt knows that our sleep lab will call her to schedule her sleep study. Pt had no questions at this time but was encouraged to call back if questions arise.

## 2016-12-16 NOTE — Telephone Encounter (Signed)
Patient called office returning nurse's call.  Please call °

## 2016-12-24 ENCOUNTER — Ambulatory Visit (INDEPENDENT_AMBULATORY_CARE_PROVIDER_SITE_OTHER): Payer: Medicare Other | Admitting: Neurology

## 2016-12-24 DIAGNOSIS — R9431 Abnormal electrocardiogram [ECG] [EKG]: Secondary | ICD-10-CM

## 2016-12-24 DIAGNOSIS — G4733 Obstructive sleep apnea (adult) (pediatric): Secondary | ICD-10-CM

## 2016-12-24 DIAGNOSIS — G4720 Circadian rhythm sleep disorder, unspecified type: Secondary | ICD-10-CM

## 2016-12-26 NOTE — Addendum Note (Signed)
Addended by: Huston FoleyATHAR, Delayne Sanzo on: 12/26/2016 11:52 AM   Modules accepted: Orders

## 2016-12-26 NOTE — Progress Notes (Signed)
Patient referred by Dr. Murray HodgkinsBartko, seen by me on 11/25/16, diagnostic PSG on 12/11/16, CPAP study on 12/24/16.  Please call and inform patient that I have entered an order for treatment with positive airway pressure (PAP) treatment of obstructive sleep apnea (OSA). She did well during the latest sleep study with CPAP. We will, therefore, arrange for a machine for home use through a DME (durable medical equipment) company of Her choice; and I will see the patient back in follow-up in about 10 weeks. Please also explain to the patient that I will be looking out for compliance data, which can be downloaded from the machine (stored on an SD card, that is inserted in the machine) or via remote access through a modem, that is built into the machine. At the time of the followup appointment we will discuss sleep study results and how it is going with PAP treatment at home. Please advise patient to bring Her machine at the time of the first FU visit, even though this is cumbersome. Bringing the machine for every visit after that will likely not be needed, but often helps for the first visit to troubleshoot if needed. Please re-enforce the importance of compliance with treatment and the need for us to monitor compliance data - often an insurance requirement and actually good feedback for the patient as far as how they are doing.  Also remind patient, that any interim PAP machine or mask issues should be first addressed with the DME company, as they can often help better with technical and mask fit issues. Please ask if patient has a preference regarding DME company.  Please also make sure, the patient has a follow-up appointment with me in about 8-10 weeks from the setup date, thanks.  Once you have spoken to the patient - and faxed/routed report to PCP and referring MD (if other than PCP), you can close this encounter, thanks,   Huston FoleySaima Arvel Oquinn, MD, PhD [facility] ([facility])

## 2016-12-26 NOTE — Procedures (Signed)
PATIENT'S NAME:  Tanya Lowe, Tanya Lowe DOB:      12/10/1970      MR#:    [id number]     DATE OF RECORDING: 12/24/2016 REFERRING M.D.:  Callie FieldingAlbert Bartko, MD Study Performed:   CPAP  Titration HISTORY: 46 year old right-handed woman with an underlying medical history of smoking, low back pain, on chronic narcotic pain medication, fibromyalgia, anxiety, and obesity, who returns for a full night CPAP titration for her OSA. She had a PSG performed on 12/11/2016 with AHI of 10.8, REM AHI of 9.7 and supine AHI of 65. O2 nadir was 86%. The patient endorsed the Epworth Sleepiness Scale at 6 points. The patient's weight 198 pounds with a height of 62 (inches), resulting in a BMI of 36.5 kg/m2. The patient's neck circumference measured 16 inches.  CURRENT MEDICATIONS: Tylenol, Aspirin, Lipitor, Buspar, Cymbalta, Neurontin, Lisinopril, Lopressor, Nitrostat, Prilosec, Percocet, Zanaflex   PROCEDURE:  This is a multichannel digital polysomnogram utilizing the SomnoStar 11.2 system.  Electrodes and sensors were applied and monitored per AASM Specifications.   EEG, EOG, Chin and Limb EMG, were sampled at 200 Hz.  ECG, Snore and Nasal Pressure, Thermal Airflow, Respiratory Effort, CPAP Flow and Pressure, Oximetry was sampled at 50 Hz. Digital video and audio were recorded.      The patient was fitted with small P10 nasal pillows. CPAP was initiated at 5 cmH20 with heated humidity per AASM standards and pressure was advanced to 9 cmH20 because of hypopneas, apneas and desaturations.  At a PAP pressure of 9 cmH20, there was a reduction of the AHI to 0, O2 nadir of 92%, REM sleep achieved.   Lights Out was at 21:18 and Lights On at 04:58. Total recording time (TRT) was 460.5 minutes, with a total sleep time (TST) of 382.5 minutes. The patient's sleep latency was 12 minutes with 10.5 minutes of wake time after sleep onset. REM latency was 187.5 minutes, which is prolonged.  The sleep efficiency was 83.1 %.    SLEEP ARCHITECTURE: WASO  (Wake after sleep onset) was 55.5 minutes with mild to moderate sleep fragmentation noted.  There were 25.5 minutes in Stage N1, 187 minutes Stage N2, 107.5 minutes Stage N3 and 62.5 minutes in Stage REM.  The percentage of Stage N1 was 6.7%, Stage N2 was 48.9%, which is normal, Stage N3 was 28.1%, which is mildly increased, and Stage R (REM sleep) was 16.3%, which is mildly decreased.   The arousals were noted as: 54 were spontaneous, 0 were associated with PLMs, 1 were associated with respiratory events.  Audio and video analysis did not show any abnormal or unusual movements, behaviors, phonations or vocalizations.  The patient took 1 bathroom break. The EKG showed occasional PACs.   RESPIRATORY ANALYSIS:  There was a total of 1 respiratory events: 0 obstructive apneas, 0 central apneas and 0 mixed apneas with a total of 0 apneas and an apnea index (AI) of 0 /hour. There were 1 hypopneas with a hypopnea index of .2/hour. The patient also had 0 respiratory event related arousals (RERAs).      The total APNEA/HYPOPNEA INDEX  (AHI) was .2 /hour and the total RESPIRATORY DISTURBANCE INDEX was .2 .hour  0 events occurred in REM sleep and 1 events in NREM. The REM AHI was 0 /hour versus a non-REM AHI of .2 /hour.  The patient spent 0 minutes of total sleep time in the supine position and 383 minutes in non-supine. The supine AHI was 0.0, versus a non-supine AHI of 0.2.  OXYGEN SATURATION & C02:  The baseline 02 saturation was 96%, with the lowest being 89%. Time spent below 89% saturation equaled 0 minutes.  PERIODIC LIMB MOVEMENTS:    The patient had a total of 0 Periodic Limb Movements. The Periodic Limb Movement (PLM) index was 0 and the PLM Arousal index was 0 /hour. Post-study, the patient indicated that sleep was better than usual.  DIAGNOSIS 1. Obstructive Sleep Apnea  2. Nonspecific abnormal EKG 3. Dysfunction associated with Sleep stages or arousal from sleep     PLANS/RECOMMENDATIONS: 1. This study demonstrates resolution of the patient's obstructive sleep apnea with CPAP therapy. I will, therefore, start the patient on home CPAP treatment at a pressure of 9 cm via small nasal pillows with heated humidity. The patient should be reminded to be fully compliant with PAP therapy to improve sleep related symptoms and decrease long term cardiovascular risks. The patient should be reminded, that it may take up to 3 months to get fully used to using PAP with all planned sleep. The earlier full compliance is achieved, the better long term compliance tends to be. Please note that untreated obstructive sleep apnea carries additional perioperative morbidity. Patients with significant obstructive sleep apnea should receive perioperative PAP therapy and the surgeons and particularly the anesthesiologist should be informed of the diagnosis and the severity of the sleep disordered breathing. 2. The study showed occasional PACs on single lead EKG; clinical correlation is recommended and consultation with cardiology may be feasible.  3. This study shows sleep fragmentation and abnormal sleep stage percentages; these are nonspecific findings and per se do not signify an intrinsic sleep disorder or a cause for the patient's sleep-related symptoms. Causes include (but are not limited to) the first night effect of the sleep study, circadian rhythm disturbances, medication effect or an underlying mood disorder or medical problem.  4. The patient should be cautioned not to drive, work at heights, or operate dangerous or heavy equipment when tired or sleepy. Review and reiteration of good sleep hygiene measures should be pursued with any patient. 5. The patient will be seen in follow-up by Dr. Frances Furbish at [facility] for discussion of the test results and further management strategies. The referring provider will be notified of the test results.  I certify that I have reviewed the entire raw data  recording prior to the issuance of this report in accordance with the Standards of Accreditation of the American Academy of Sleep Medicine (AASM)  Huston Foley, MD, PhD Diplomat, American Board of Psychiatry and Neurology (Neurology and Sleep Medicine)

## 2016-12-30 ENCOUNTER — Telehealth: Payer: Self-pay

## 2016-12-30 NOTE — Telephone Encounter (Signed)
Tanya Lowe,  When I was speaking with patient. She had a question about the bill she received. During 1st visit, patient report that there was "something wrong with her Medicaid". Tanya Lowe says it should be straightened out now. She needs to know if she needs to pay this bill.

## 2016-12-30 NOTE — Telephone Encounter (Signed)
I spoke to patient and she is aware of results and recommendations. She is willing to start treatment. [place] to go to [place]. I [facility] PCP wil receive a copy of the report. Patient was able to make f/u appt.

## 2016-12-30 NOTE — Telephone Encounter (Signed)
-----   Message from Tanya FoleySaima Athar, MD sent at 12/26/2016 11:52 AM EST ----- Patient referred by Dr. Murray HodgkinsBartko, seen by me on 11/25/16, diagnostic PSG on 12/11/16, CPAP study on 12/24/16.  Please call and inform patient that I have entered an order for treatment with positive airway pressure (PAP) treatment of obstructive sleep apnea (OSA). She did well during the latest sleep study with CPAP. We will, therefore, arrange for a machine for home use through a DME (durable medical equipment) company of Her choice; and I will see the patient back in follow-up in about 10 weeks. Please also explain to the patient that I will be looking out for compliance data, which can be downloaded from the machine (stored on an SD card, that is inserted in the machine) or via remote access through a modem, that is built into the machine. At the time of the followup appointment we will discuss sleep study results and how it is going with PAP treatment at home. Please advise patient to bring Her machine at the time of the first FU visit, even though this is cumbersome. Bringing the machine for every visit after that will likely not be needed, but often helps for the first visit to troubleshoot if needed. Please re-enforce the importance of compliance with treatment and the need for us to monitor compliance data - often an insurance requirement and actually good feedback for the patient as far as how they are doing.  Also remind patient, that any interim PAP machine or mask issues should be first addressed with the DME company, as they can often help better with technical and mask fit issues. Please ask if patient has a preference regarding DME company.  Please also make sure, the patient has a follow-up appointment with me in about 8-10 weeks from the setup date, thanks.  Once you have spoken to the patient - and faxed/routed report to PCP and referring MD (if other than PCP), you can close this encounter, thanks,   Tanya FoleySaima Athar, MD,  PhD [facility] ([facility]

## 2017-01-07 NOTE — Telephone Encounter (Signed)
Called and LM with phone number [phone]

## 2017-01-07 NOTE — Telephone Encounter (Signed)
Pt called back said [facility] has not rec'd RX for CPAP. Please call pt when this has been faxed

## 2017-01-07 NOTE — Telephone Encounter (Signed)
I have confirmation that referral was faxed and received 12/30/16 at 17:20. I have re-faxed this referral now.

## 2017-01-07 NOTE — Telephone Encounter (Signed)
Pt has called back in stating that the telephone # she was given by Lafonda Mossesiana [facility]# she is asking to be called on her home# with the telephone# for them please

## 2017-01-08 ENCOUNTER — Telehealth: Payer: Self-pay | Admitting: Neurology

## 2017-01-08 NOTE — Telephone Encounter (Signed)
[facility] called [facility] they stated they have not received an order for patient to received CPAP machine.  Please call

## 2017-01-08 NOTE — Telephone Encounter (Signed)
I spoke to patient.  I called [facility], they state that they have the referral and just waiting for insurance approval.  I let patient know she voiced understanding.

## 2017-03-30 ENCOUNTER — Ambulatory Visit: Payer: Self-pay | Admitting: Neurology

## 2017-03-31 ENCOUNTER — Encounter: Payer: Self-pay | Admitting: Adult Health

## 2017-04-01 ENCOUNTER — Ambulatory Visit (INDEPENDENT_AMBULATORY_CARE_PROVIDER_SITE_OTHER): Payer: Medicare Other | Admitting: Adult Health

## 2017-04-01 ENCOUNTER — Encounter: Payer: Self-pay | Admitting: Adult Health

## 2017-04-01 VITALS — BP 117/75 | HR 80 | Wt 205.8 lb

## 2017-04-01 DIAGNOSIS — I2510 Atherosclerotic heart disease of native coronary artery without angina pectoris: Secondary | ICD-10-CM | POA: Diagnosis not present

## 2017-04-01 DIAGNOSIS — G4733 Obstructive sleep apnea (adult) (pediatric): Secondary | ICD-10-CM | POA: Diagnosis not present

## 2017-04-01 DIAGNOSIS — Z9989 Dependence on other enabling machines and devices: Secondary | ICD-10-CM

## 2017-04-01 NOTE — Patient Instructions (Addendum)
Continue using CPAP nightly >4 hours each night Order sent to DME company: for memory chip Mask refitting today If your symptoms worsen or you develop new symptoms please let us know.

## 2017-04-01 NOTE — Progress Notes (Addendum)
PATIENT: Tanya Lowe DOB: February 11, 1971  REASON FOR VISIT: follow up- obstructive sleep apnea on CPAP HISTORY FROM: patient  HISTORY OF PRESENT ILLNESS: Tanya Lowe is a 46 year old female with a history of obstructive sleep apnea on CPAP. She returns today for her first compliance visit. Her download indicates that she uses her machine 30 out of 30 days for compliance of 100%. She uses her machine greater than 4 hours 29 out of 30 days for compliance of 97%. On average she uses her machine 6 hours and 5 minutes. Her residual AHI is 9.o on 9 cm of water with EPR of 2. She has a significant leak in the 95th percentile at 112 L/m. The patient states that she puts the machine on every night but she does wake up and sometime during the night she has taken it off. She reports that she always puts the mask back on. At this point she states that she does not notice any change in her sleep pattern. She returns today for an evaluation.   HISTORY 11/25/16 Copied from Dr. Teofilo Pod notes: Tanya Lowe is a 46 year old right-handed woman with an underlying medical history of smoking, low back pain, on chronic narcotic pain medication, fibromyalgia, anxiety, and obesity, who reports snoring and excessive daytime somnolence. I reviewed her office note from 10/30/2016, which you kindly included. She has undergone epidural steroid injections. She avoids taking opioids after 6 PM as instructed. She smokes one pack per day, she is trying to quit, she endorses stress. She is on disability. She used to work as a Administrator, sports. She has 2 grown children. She lives with her boyfriend. She drinks no alcohol, 1 serving of caffeine per day. Bedtime is around 10 PM. She watches TV in bed and puts the TV on a timer. Wakeup time is typically between 4 and 5 AM. She does not sleep longer than this typically. She denies night to night nocturia but has occasional morning headaches. Of note, her mother and her sister both have sleep apnea and  uses CPAP machines. She endorses restless leg symptoms and feels that the combination of gabapentin and Cymbalta has been helpful for this. She has a history of leg twitching in her sleep and is a light sleeper. Of note, she takes Gabapentin 1200 mg tid, Cymbalta 60 mg once daily, buspar 7.5 mg bid, Zanaflex 4 mg tid.   Her Epworth sleepiness score is 6 out of 24 today, her fatigue score is 14 out of 63.  REVIEW OF SYSTEMS: Out of a complete 14 system review of symptoms, the patient complains only of the following symptoms, and all other reviewed systems are negative.  Numbness, joint pain, back pain, walking difficulty, neck pain  ALLERGIES: Allergies  Allergen Reactions  . Lyrica [Pregabalin] Shortness Of Breath    Chest Pain   . Bactrim [Sulfamethoxazole-Trimethoprim]     rash  . Prednisone Hypertension    Tachycardia     HOME MEDICATIONS: Outpatient Medications Prior to Visit  Medication Sig Dispense Refill  . acetaminophen (TYLENOL) 500 MG tablet Take 1,000 mg by mouth every 6 (six) hours as needed for mild pain.     Marland Kitchen aspirin EC 81 MG tablet Take 81 mg by mouth daily.    Marland Kitchen atorvastatin (LIPITOR) 80 MG tablet Take 1 tablet (80 mg total) by mouth daily at 6 PM. 30 tablet 11  . busPIRone (BUSPAR) 7.5 MG tablet Take 7.5 mg by mouth 2 (two) times daily.    Marland Kitchen  DULoxetine (CYMBALTA) 60 MG capsule Take 60 mg by mouth daily.    Marland Kitchen gabapentin (NEURONTIN) 600 MG tablet Take 1,200 mg by mouth 3 (three) times daily.    Marland Kitchen lisinopril (PRINIVIL,ZESTRIL) 2.5 MG tablet Take 1 tablet (2.5 mg total) by mouth daily. 90 tablet 3  . metoprolol tartrate (LOPRESSOR) 25 MG tablet Take 0.5 tablets (12.5 mg total) by mouth 2 (two) times daily. 60 tablet 5  . nitroGLYCERIN (NITROSTAT) 0.4 MG SL tablet Place 1 tablet (0.4 mg total) under the tongue every 5 (five) minutes as needed for chest pain. 25 tablet 3  . omeprazole (PRILOSEC) 20 MG capsule Take 20 mg by mouth daily.    Marland Kitchen oxyCODONE-acetaminophen  (PERCOCET) 10-325 MG per tablet Take 1 tablet by mouth 3 (three) times daily as needed for pain.    Marland Kitchen tiZANidine (ZANAFLEX) 4 MG tablet Take 4 mg by mouth every 8 (eight) hours as needed for muscle spasms.      No facility-administered medications prior to visit.     PAST MEDICAL HISTORY: Past Medical History:  Diagnosis Date  . Arthritis   . Bilateral hand pain   . Cervical radiculopathy   . Chronic neck and back pain   . Fibromyalgia   . Hypertension   . Kidney stones    hx of w previous lithotripsy  . MI (myocardial infarction) (HCC)   . Pain management    [facility]  . Thoracic back pain     PAST SURGICAL HISTORY: Past Surgical History:  Procedure Laterality Date  . CARDIAC SURGERY    . CYSTOSCOPY WITH RETROGRADE PYELOGRAM, URETEROSCOPY AND STENT PLACEMENT Left 03/14/2015   Procedure: CYSTOSCOPY WITH LEFT RETROGRADE PYELOGRAM;  Surgeon: Alfredo Martinez, MD;  Location: [facility];  Service: Urology;  Laterality: Left;  . CYSTOSCOPY WITH RETROGRADE PYELOGRAM, URETEROSCOPY AND STENT PLACEMENT Left 03/20/2015   Procedure: CYSTOSCOPY WITH LEFT  RETROGRADE LEFT URETEROSCOPY AND LEFT  STENT ;  Surgeon: Bjorn Pippin, MD;  Location: [facility];  Service: Urology;  Laterality: Left;  . HOLMIUM LASER APPLICATION Left 03/20/2015   Procedure: HOLMIUM LASER APPLICATION;  Surgeon: Bjorn Pippin, MD;  Location: [facility];  Service: Urology;  Laterality: Left;  . LEFT HEART CATHETERIZATION WITH CORONARY ANGIOGRAM N/A 10/25/2014   Procedure: LEFT HEART CATHETERIZATION WITH CORONARY ANGIOGRAM;  Surgeon: Kathleene Hazel, MD;  Location: [facility] CATH LAB;  Service: Cardiovascular;  Laterality: N/A;  . LITHOTRIPSY  about 2011  . SPINAL FUSION     L5-S1  . TUBAL LIGATION      FAMILY HISTORY: Family History  Problem Relation Age of Onset  . Diabetes Mother   . Stroke Mother   . Seizures Other   . Cancer Other   . Diabetes Other     SOCIAL HISTORY: Social History   Social History  . Marital status: Divorced      Spouse name: N/A  . Number of children: N/A  . Years of education: N/A   Occupational History  . Not on file.   Social History Main Topics  . Smoking status: Current Every Day Smoker    Packs/day: 1.00    Years: 24.00    Types: Cigarettes    Start date: 10/25/1990  . Smokeless tobacco: Never Used  . Alcohol use No  . Drug use: No  . Sexual activity: Not on file   Other Topics Concern  . Not on file   Social History Narrative  . No narrative on file      PHYSICAL EXAM  Vitals:   04/01/17 0908  BP: 117/75  Pulse: 80  Weight: 205 lb 12.8 oz (93.4 kg)   Body mass index is 37.64 kg/m.  Generalized: Well developed, in no acute distress   Neurological examination  Mentation: Alert oriented to time, place, history taking. Follows all commands speech and language fluent Cranial nerve II-XII: Pupils were equal round reactive to light. Extraocular movements were full, visual field were full on confrontational test. Facial sensation and strength were normal. Uvula tongue midline. Head turning and shoulder shrug  were normal and symmetric. Motor: The motor testing reveals 5 over 5 strength of all 4 extremities. Good symmetric motor tone is noted throughout.  Sensory: Sensory testing is intact to soft touch on all 4 extremities. No evidence of extinction is noted.  Coordination: Cerebellar testing reveals good finger-nose-finger and heel-to-shin bilaterally.  Gait and station: Gait is normal. Tandem gait is normal. Romberg is negative. No drift is seen.  Reflexes: Deep tendon reflexes are symmetric and normal bilaterally.   DIAGNOSTIC DATA (LABS, IMAGING, TESTING) - I reviewed patient records, labs, notes, testing and imaging myself where available.  Lab Results  Component Value Date   WBC 8.5 07/30/2015   HGB 13.4 07/30/2015   HCT 40.7 07/30/2015   MCV 86.4 07/30/2015   PLT 254 07/30/2015      Component Value Date/Time   NA 140 07/30/2015 1218   K 3.9 07/30/2015  1218   CL 106 07/30/2015 1218   CO2 27 07/30/2015 1218   GLUCOSE 132 (H) 07/30/2015 1218   BUN 9 07/30/2015 1218   CREATININE 0.75 07/30/2015 1218   CALCIUM 8.6 (L) 07/30/2015 1218   PROT 7.2 03/14/2015 0020   ALBUMIN 3.3 (L) 03/14/2015 0020   AST 28 03/14/2015 0020   ALT 22 03/14/2015 0020   ALKPHOS 95 03/14/2015 0020   BILITOT 0.4 03/14/2015 0020   GFRNONAA >60 07/30/2015 1218   GFRAA >60 07/30/2015 1218   Lab Results  Component Value Date   CHOL 121 10/25/2014   HDL 21 (L) 10/25/2014   LDLCALC 50 10/25/2014   TRIG 251 (H) 10/25/2014   CHOLHDL 5.8 10/25/2014   Lab Results  Component Value Date   HGBA1C 5.9 (H) 10/24/2014    Lab Results  Component Value Date   TSH 2.092 10/24/2014      ASSESSMENT AND PLAN 46 y.o. year old female  has a past medical history of Arthritis; Bilateral hand pain; Cervical radiculopathy; Chronic neck and back pain; Fibromyalgia; Hypertension; Kidney stones; MI (myocardial infarction) (HCC); Pain management; and Thoracic back pain. here with:  1. Obstructive sleep apnea on CPAP  The patient download shows that she is using the machine nightly however she does have a significant leak. Her apnea index is also elevated. She will stop by her sleep lab today and have her mask refitted. I have sent an order to her DME company to have us added to their wireless website and advised that they should give the patient a memory chip for her machine. Patient advised that she will follow-up in 4 months with Dr. Frances FurbishAthar for a another download.  I spent 15 minutes with the patient. 50% of this time was spent discussing her CPAP download.   Butch PennyMegan Octavia Velador, MSN, NP-C 04/01/2017, 9:10 AM [facility] [address], [address] [place], [place] [address] [phone]  I reviewed the above note and documentation by the Nurse Practitioner and agree with the history, physical exam, assessment and plan as outlined above. I  was immediately available for  face-to-face consultation. Huston Foley, MD, PhD [facility])

## 2017-06-29 ENCOUNTER — Telehealth: Payer: Self-pay | Admitting: Adult Health

## 2017-06-29 NOTE — Telephone Encounter (Signed)
Spoke to pt today to r/s her apt, and she says she never receieved a memory chip for her CPAP. DME told her that they send the memory chip directly to [facility].

## 2017-06-29 NOTE — Telephone Encounter (Signed)
I called [facility]. They will check on this and contact the pt. They will call us back if they need anything from us.

## 2017-08-03 ENCOUNTER — Ambulatory Visit: Payer: Medicare Other | Admitting: Neurology

## 2017-08-05 ENCOUNTER — Encounter: Payer: Self-pay | Admitting: Cardiology

## 2017-08-05 ENCOUNTER — Ambulatory Visit (INDEPENDENT_AMBULATORY_CARE_PROVIDER_SITE_OTHER): Payer: Medicare Other | Admitting: Cardiology

## 2017-08-05 VITALS — BP 118/80 | HR 97 | Ht 62.0 in | Wt 211.0 lb

## 2017-08-05 DIAGNOSIS — E782 Mixed hyperlipidemia: Secondary | ICD-10-CM | POA: Diagnosis not present

## 2017-08-05 DIAGNOSIS — I2510 Atherosclerotic heart disease of native coronary artery without angina pectoris: Secondary | ICD-10-CM

## 2017-08-05 DIAGNOSIS — I10 Essential (primary) hypertension: Secondary | ICD-10-CM | POA: Diagnosis not present

## 2017-08-05 MED ORDER — METOPROLOL TARTRATE 25 MG PO TABS: 25.0000 mg | ORAL_TABLET | Freq: Two times a day (BID) | ORAL | 3 refills | Status: DC

## 2017-08-05 NOTE — Progress Notes (Signed)
Clinical Summary Ms. Ottis StainHuff is a 46 y.o.female seen today for follow up of the following medical problems.   1. CAD - history of NSTEMI Jan 2016, s/p DES to mid RCA and DES to distal RCA and DES to mid LAD.  - 04/2015 echo LVEF 55-60% -11/2015 DSE without ischemia - 05/2016 nuclear stress [place] no clear ischemia, limited by signicant gut radiotrace uptake   - recent episode of chest pain 1 month ago while at home at rest. Pressure midchest and right ear, 8/10 in severity. No other associated symptoms. Not positional. Took NG with some improvement. Called 911 EMS. [facility] Monitored overnight, trop neg x 2 per her report. She left prior to completion of workup. - first episode in 1 year - has noted some increase in SOB/DOE.    2. Palpitations - no recent symptoms   3. HTN - compliant with meds  4. Hyperlipidemia 07/2017 TC 103 TG 106 HDL 39 LDL 45 - compliant with statin Past Medical History:  Diagnosis Date  . Arthritis   . Bilateral hand pain   . Cervical radiculopathy   . Chronic neck and back pain   . Fibromyalgia   . Hypertension   . Kidney stones    hx of w previous lithotripsy  . MI (myocardial infarction) (HCC)   . Pain management    [facility]  . Thoracic back pain      Allergies  Allergen Reactions  . Lyrica [Pregabalin] Shortness Of Breath    Chest Pain   . Bactrim [Sulfamethoxazole-Trimethoprim]     rash  . Prednisone Hypertension    Tachycardia      Current Outpatient Prescriptions  Medication Sig Dispense Refill  . acetaminophen (TYLENOL) 500 MG tablet Take 1,000 mg by mouth every 6 (six) hours as needed for mild pain.     Marland Kitchen. aspirin EC 81 MG tablet Take 81 mg by mouth daily.    Marland Kitchen. atorvastatin (LIPITOR) 80 MG tablet Take 1 tablet (80 mg total) by mouth daily at 6 PM. 30 tablet 11  . busPIRone (BUSPAR) 7.5 MG tablet Take 7.5 mg by mouth 2 (two) times daily.    . DULoxetine (CYMBALTA) 60 MG capsule Take 60 mg by mouth daily.     Marland Kitchen. gabapentin (NEURONTIN) 600 MG tablet Take 1,200 mg by mouth 3 (three) times daily.    Marland Kitchen. lisinopril (PRINIVIL,ZESTRIL) 2.5 MG tablet Take 1 tablet (2.5 mg total) by mouth daily. 90 tablet 3  . metoprolol tartrate (LOPRESSOR) 25 MG tablet Take 0.5 tablets (12.5 mg total) by mouth 2 (two) times daily. 60 tablet 5  . nitroGLYCERIN (NITROSTAT) 0.4 MG SL tablet Place 1 tablet (0.4 mg total) under the tongue every 5 (five) minutes as needed for chest pain. 25 tablet 3  . omeprazole (PRILOSEC) 20 MG capsule Take 20 mg by mouth daily.    Marland Kitchen. oxyCODONE-acetaminophen (PERCOCET) 10-325 MG per tablet Take 1 tablet by mouth 3 (three) times daily as needed for pain.    Marland Kitchen. tiZANidine (ZANAFLEX) 4 MG tablet Take 4 mg by mouth every 8 (eight) hours as needed for muscle spasms.      No current facility-administered medications for this visit.      Past Surgical History:  Procedure Laterality Date  . CARDIAC SURGERY    . CYSTOSCOPY WITH RETROGRADE PYELOGRAM, URETEROSCOPY AND STENT PLACEMENT Left 03/14/2015   Procedure: CYSTOSCOPY WITH LEFT RETROGRADE PYELOGRAM;  Surgeon: Alfredo MartinezScott MacDiarmid, MD;  Location: [facility];  Service: Urology;  Laterality: Left;  . CYSTOSCOPY WITH RETROGRADE PYELOGRAM, URETEROSCOPY AND STENT PLACEMENT Left 03/20/2015   Procedure: CYSTOSCOPY WITH LEFT  RETROGRADE LEFT URETEROSCOPY AND LEFT  STENT ;  Surgeon: Bjorn Pippin, MD;  Location: [facility];  Service: Urology;  Laterality: Left;  . HOLMIUM LASER APPLICATION Left 03/20/2015   Procedure: HOLMIUM LASER APPLICATION;  Surgeon: Bjorn Pippin, MD;  Location: [facility];  Service: Urology;  Laterality: Left;  . LEFT HEART CATHETERIZATION WITH CORONARY ANGIOGRAM N/A 10/25/2014   Procedure: LEFT HEART CATHETERIZATION WITH CORONARY ANGIOGRAM;  Surgeon: Kathleene Hazel, MD;  Location: [facility] CATH LAB;  Service: Cardiovascular;  Laterality: N/A;  . LITHOTRIPSY  about 2011  . SPINAL FUSION     L5-S1  . TUBAL LIGATION       Allergies  Allergen Reactions  .  Lyrica [Pregabalin] Shortness Of Breath    Chest Pain   . Bactrim [Sulfamethoxazole-Trimethoprim]     rash  . Prednisone Hypertension    Tachycardia       Family History  Problem Relation Age of Onset  . Diabetes Mother   . Stroke Mother   . Seizures Other   . Cancer Other   . Diabetes Other      Social History Ms. Chesmore reports that she has been smoking Cigarettes.  She started smoking about 26 years ago. She has a 24.00 pack-year smoking history. She has never used smokeless tobacco. Ms. Broshears reports that she does not drink alcohol.   Review of Systems CONSTITUTIONAL: No weight loss, fever, chills, weakness or fatigue.  HEENT: Eyes: No visual loss, blurred vision, double vision or yellow sclerae.No hearing loss, sneezing, congestion, runny nose or sore throat.  SKIN: No rash or itching.  CARDIOVASCULAR: per hpi RESPIRATORY: No shortness of breath, cough or sputum.  GASTROINTESTINAL: No anorexia, nausea, vomiting or diarrhea. No abdominal pain or blood.  GENITOURINARY: No burning on urination, no polyuria NEUROLOGICAL: No headache, dizziness, syncope, paralysis, ataxia, numbness or tingling in the extremities. No change in bowel or bladder control.  MUSCULOSKELETAL: No muscle, back pain, joint pain or stiffness.  LYMPHATICS: No enlarged nodes. No history of splenectomy.  PSYCHIATRIC: No history of depression or anxiety.  ENDOCRINOLOGIC: No reports of sweating, cold or heat intolerance. No polyuria or polydipsia.  Marland Kitchen   Physical Examination Vitals:   08/05/17 1524  BP: 118/80  Pulse: 97  SpO2: 98%   Vitals:   08/05/17 1524  Weight: 211 lb (95.7 kg)  Height: 5\' 2"  (1.575 m)    Gen: resting comfortably, no acute distress HEENT: no scleral icterus, pupils equal round and reactive, no palptable cervical adenopathy,  CV: RRR, no m/r/g, no jvd Resp: Clear to auscultation bilaterally GI: abdomen is soft, non-tender, non-distended, normal bowel sounds, no  hepatosplenomegaly MSK: extremities are warm, no edema.  Skin: warm, no rash Neuro:  no focal deficits Psych: appropriate affect   Diagnostic Studies Jan 2016 Cath PCI Note:She was given an additional 5000 units IV heparin x 1. ACT was over 200. She was given Brilinta 180 mg po x 1.   Lesion # 1 Distal RCA: JR4 guiding catheter used to engage the RCA. Cougar IC wire down the RCA. 2.0 x 15 mm balloon x 1 for pre-dilatation. 2.25 x 16 mm Promus Premier DES x 1 distal RCA post-dilated with a 2.5 x 12 mm Jenkins balloon x 1. Stenosis taken from 90% down to 0%.   Lesion # 2 mid RCA: 2.0 x 15 mm balloon x 1. 3.0 x 12  mm Promus Premier DES x 1. The mid stenosis was followed by an aneurysmal segment with a size mismatch before the distal vessel so stents were not overlapped. Post-dilatation with a 3.5 x 8 mm [place] balloon x 1. Stenosis taken from 95% down to 0%.   Lesion #3 mid LAD: XB LAD 3.5 guiding catheter to engage left main. 2.5 x 12 mm balloon x 1 for pre-dilatation. 3.5 x 16 mm Promus Premier DES x 1 mid LAD. Stent post-dilated with a 3.5 x 8 mm [place] balloon x 2. Stenosis taken from 80% down to 0%.  The sheath was removed from the right radial artery and a Terumo hemostasis band was applied at the arteriotomy site on the right wrist. There were no immediate complications. The patient was taken to the recovery area in stable condition.   Hemodynamic Findings: Central aortic pressure: 115/66 Left ventricular pressure: 121/3/14  Angiographic Findings:  Left main: No obstructive disease.   Left Anterior Descending Artery: Large caliber vessel that courses to the apex. 80% mid stenosis. Small caliber diagonal Makenzee Choudhry. The distal LAD tapers to a small caliber vessel.   Circumflex Artery: Large caliber vessel with 20% proximal stenosis.   Right Coronary Artery: Large dominant vessel with diffuse 30% proximal stenosis, 95% mid stenosis followed by an aneurysmal segment. Distal 90% stenosis.  Diffuse mild plaque in the posterolateral artery and PDA.   Left Ventricular Angiogram: LVEF=55-60%.   Impression: 1. Severe double vessel CAD 2. NSTEMI 3. Normal LV systolic function 4. Successful PTCA/DES x 1 mid RCA and PTCA/DES x 1 distal RCA (stents not overlapped due to size mismatch in the vessel and the presence of an aneurysmal segment in the distal segment of the mid vessel) 5. Successful PTCA/DES x 1 mid LAD  Recommendations: Will need dual anti-platelet therapy with ASA and Brilinta for at least one year. Continue beta blocker and statin. Tobacco cessation.    04/2015 echo Study Conclusions  - Left ventricle: The cavity size was normal. Wall thickness was increased in a pattern of mild LVH. Systolic function was normal. The estimated ejection fraction was in the range of 55% to 60%. Wall motion was normal; there were no regional wall motion abnormalities. Left ventricular diastolic function parameters were normal. - Aortic valve: Mildly calcified annulus. Trileaflet; mildly thickened leaflets. Valve area (VTI): 2.07 cm^2. Valve area (Vmax): 2.2 cm^2. - Atrial septum: No defect or patent foramen ovale was identified. - Technically adequate study.  11/2015 DSE ------------------------------------------------------------------- Stress echo results:     Left ventricular ejection fraction was normal at rest and with stress. Normal echo stress  05/2016 Nuclear stress [place] Severe gut uptake, limited study. No clear ischemia    Assessment and Plan  1. CAD - history of NSTEMI, s/p stenting to RCA and LAD - isolated episode of chest pain. We will increase lopressor to 25mg  bid for additional antianginal effects  2. HTN - bp is at goal, continue current meds  3. Hyperlipidemia - at goal, continue current staitn  F/u 3 months       Antoine Poche, M.D.

## 2017-08-05 NOTE — Patient Instructions (Signed)
Medication Instructions:  INCREASE LOPRESSOR TO 25 MG - TWO TIMES DAILY       Labwork: NONE  Testing/Procedures: NONE  Follow-Up: Your physician recommends that you schedule a follow-up appointment in: 3 MONTHS    Any Other Special Instructions Will Be Listed Below (If Applicable).     If you need a refill on your cardiac medications before your next appointment, please call your pharmacy.

## 2017-08-23 ENCOUNTER — Encounter: Payer: Self-pay | Admitting: Neurology

## 2017-08-26 ENCOUNTER — Encounter: Payer: Self-pay | Admitting: Neurology

## 2017-08-26 ENCOUNTER — Ambulatory Visit (INDEPENDENT_AMBULATORY_CARE_PROVIDER_SITE_OTHER): Payer: Medicare Other | Admitting: Neurology

## 2017-08-26 VITALS — BP 120/72 | HR 74 | Resp 18 | Wt 207.0 lb

## 2017-08-26 DIAGNOSIS — I2510 Atherosclerotic heart disease of native coronary artery without angina pectoris: Secondary | ICD-10-CM | POA: Diagnosis not present

## 2017-08-26 DIAGNOSIS — Z9989 Dependence on other enabling machines and devices: Secondary | ICD-10-CM | POA: Diagnosis not present

## 2017-08-26 DIAGNOSIS — G4733 Obstructive sleep apnea (adult) (pediatric): Secondary | ICD-10-CM | POA: Diagnosis not present

## 2017-08-26 NOTE — Patient Instructions (Addendum)
Keep up the good work! We can see you in 1 year, you can see Butch PennyMegan Millikan next time. I will see you after that.   I placed an order for supplies. I replaced your filter.   Please continue using your CPAP regularly. While your insurance requires that you use CPAP at least 4 hours each night on 70% of the nights, I recommend, that you not skip any nights and use it throughout the night if you can. Getting used to CPAP and staying with the treatment long term does take time and patience and discipline. Untreated obstructive sleep apnea when it is moderate to severe can have an adverse impact on cardiovascular health and raise her risk for heart disease, arrhythmias, hypertension, congestive heart failure, stroke and diabetes. Untreated obstructive sleep apnea causes sleep disruption, nonrestorative sleep, and sleep deprivation. This can have an impact on your day to day functioning and cause daytime sleepiness and impairment of cognitive function, memory loss, mood disturbance, and problems focussing. Using CPAP regularly can improve these symptoms.
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
## 2017-11-17 ENCOUNTER — Encounter: Payer: Self-pay | Admitting: Cardiology

## 2017-11-17 ENCOUNTER — Ambulatory Visit (INDEPENDENT_AMBULATORY_CARE_PROVIDER_SITE_OTHER): Payer: Medicare Other | Admitting: Cardiology

## 2017-11-17 VITALS — BP 92/58 | HR 106 | Ht 62.0 in | Wt 207.0 lb

## 2017-11-17 DIAGNOSIS — E782 Mixed hyperlipidemia: Secondary | ICD-10-CM | POA: Diagnosis not present

## 2017-11-17 DIAGNOSIS — Z01810 Encounter for preprocedural cardiovascular examination: Secondary | ICD-10-CM

## 2017-11-17 DIAGNOSIS — I10 Essential (primary) hypertension: Secondary | ICD-10-CM | POA: Diagnosis not present

## 2017-11-17 DIAGNOSIS — I2510 Atherosclerotic heart disease of native coronary artery without angina pectoris: Secondary | ICD-10-CM

## 2017-11-17 DIAGNOSIS — Z0289 Encounter for other administrative examinations: Secondary | ICD-10-CM

## 2017-11-17 NOTE — Patient Instructions (Signed)

## 2017-11-17 NOTE — Progress Notes (Signed)
Clinical Summary Ms. Mallis is a 47 y.o.female seen today for follow up of the following medical problems.   1. CAD - history of NSTEMI Jan 2016, s/p DES to mid RCA and DES to distal RCA and DES to mid LAD.  - 04/2015 echo LVEF 55-60% -11/2015 DSE without ischemia - 05/2016 nuclear stress [place] no clear ischemia, limited by signicant gut radiotrace uptake  - no recent chest pain  No SOB or DOE - compliant with meds  2. Palpitations  - no recent symptoms.   3. HTN - she is compliant with meds  4. Hyperlipidemia 07/2017 TC 103 TG 106 HDL 39 LDL 45 - compliant with meds  5. OSA - followed by Dr Frances Furbish neurology - compliant with cpap, started nearly year go.   6. Preoperative evaluation - possible spinal surgery - no recent symptoms - can walk up flight of stairs without troubles.  - 05/2016 nuclear stress [place] without ischemia.    Past Medical History:  Diagnosis Date  . Arthritis   . Bilateral hand pain   . Cervical radiculopathy   . Chronic neck and back pain   . Fibromyalgia   . Hypertension   . Kidney stones    hx of w previous lithotripsy  . MI (myocardial infarction) ([facility])   . Pain management    [facility]  . Thoracic back pain      Allergies  Allergen Reactions  . Lyrica [Pregabalin] Shortness Of Breath    Chest Pain   . Bactrim [Sulfamethoxazole-Trimethoprim]     rash  . Prednisone Hypertension    Tachycardia      Current Outpatient Medications  Medication Sig Dispense Refill  . acetaminophen (TYLENOL) 500 MG tablet Take 1,000 mg by mouth every 6 (six) hours as needed for mild pain.     Marland Kitchen amoxicillin-clavulanate (AUGMENTIN) 875-125 MG tablet     . aspirin EC 81 MG tablet Take 81 mg by mouth daily.    Marland Kitchen atorvastatin (LIPITOR) 80 MG tablet Take 1 tablet (80 mg total) by mouth daily at 6 PM. 30 tablet 11  . busPIRone (BUSPAR) 7.5 MG tablet Take 7.5 mg by mouth 2 (two) times daily.    . DULoxetine (CYMBALTA) 60 MG capsule Take 60 mg by  mouth daily.    . fluticasone (FLONASE) 50 MCG/ACT nasal spray Place 2 sprays into both nostrils daily.    Marland Kitchen gabapentin (NEURONTIN) 600 MG tablet Take 1,200 mg by mouth 3 (three) times daily.    Marland Kitchen lisinopril (PRINIVIL,ZESTRIL) 2.5 MG tablet Take 1 tablet (2.5 mg total) by mouth daily. 90 tablet 3  . metoprolol tartrate (LOPRESSOR) 25 MG tablet Take 1 tablet (25 mg total) by mouth 2 (two) times daily. 180 tablet 3  . nitroGLYCERIN (NITROSTAT) 0.4 MG SL tablet Place 1 tablet (0.4 mg total) under the tongue every 5 (five) minutes as needed for chest pain. 25 tablet 3  . omeprazole (PRILOSEC) 20 MG capsule Take 20 mg by mouth daily.    Marland Kitchen oxyCODONE-acetaminophen (PERCOCET) 10-325 MG per tablet Take 1 tablet by mouth 3 (three) times daily as needed for pain.    Marland Kitchen tiZANidine (ZANAFLEX) 4 MG tablet Take 4 mg by mouth every 8 (eight) hours as needed for muscle spasms.      No current facility-administered medications for this visit.      Past Surgical History:  Procedure Laterality Date  . CARDIAC SURGERY    . CYSTOSCOPY WITH RETROGRADE PYELOGRAM, URETEROSCOPY AND STENT PLACEMENT  Left 03/14/2015   Procedure: CYSTOSCOPY WITH LEFT RETROGRADE PYELOGRAM;  Surgeon: Alfredo MartinezScott MacDiarmid, MD;  Location: [facility];  Service: Urology;  Laterality: Left;  . CYSTOSCOPY WITH RETROGRADE PYELOGRAM, URETEROSCOPY AND STENT PLACEMENT Left 03/20/2015   Procedure: CYSTOSCOPY WITH LEFT  RETROGRADE LEFT URETEROSCOPY AND LEFT  STENT ;  Surgeon: Bjorn PippinJohn Wrenn, MD;  Location: [facility];  Service: Urology;  Laterality: Left;  . HOLMIUM LASER APPLICATION Left 03/20/2015   Procedure: HOLMIUM LASER APPLICATION;  Surgeon: Bjorn PippinJohn Wrenn, MD;  Location: [facility];  Service: Urology;  Laterality: Left;  . LEFT HEART CATHETERIZATION WITH CORONARY ANGIOGRAM N/A 10/25/2014   Procedure: LEFT HEART CATHETERIZATION WITH CORONARY ANGIOGRAM;  Surgeon: Kathleene Hazelhristopher D McAlhany, MD;  [facility];  Service: Cardiovascular;  Laterality: N/A;  . LITHOTRIPSY   about 2011  . SPINAL FUSION     L5-S1  . TUBAL LIGATION       Allergies  Allergen Reactions  . Lyrica [Pregabalin] Shortness Of Breath    Chest Pain   . Bactrim [Sulfamethoxazole-Trimethoprim]     rash  . Prednisone Hypertension    Tachycardia       Family History  Problem Relation Age of Onset  . Diabetes Mother   . Stroke Mother   . Seizures Other   . Cancer Other   . Diabetes Other      Social History Ms. Ottis StainHuff reports that she has been smoking cigarettes.  She started smoking about 27 years ago. She has a 24.00 pack-year smoking history. she has never used smokeless tobacco. Ms. Ottis StainHuff reports that she does not drink alcohol.   Review of Systems CONSTITUTIONAL: No weight loss, fever, chills, weakness or fatigue.  HEENT: Eyes: No visual loss, blurred vision, double vision or yellow sclerae.No hearing loss, sneezing, congestion, runny nose or sore throat.  SKIN: No rash or itching.  CARDIOVASCULAR: per hpi RESPIRATORY: No shortness of breath, cough or sputum.  GASTROINTESTINAL: No anorexia, nausea, vomiting or diarrhea. No abdominal pain or blood.  GENITOURINARY: No burning on urination, no polyuria NEUROLOGICAL: No headache, dizziness, syncope, paralysis, ataxia, numbness or tingling in the extremities. No change in bowel or bladder control.  MUSCULOSKELETAL:+back pain.  LYMPHATICS: No enlarged nodes. No history of splenectomy.  PSYCHIATRIC: No history of depression or anxiety.  ENDOCRINOLOGIC: No reports of sweating, cold or heat intolerance. No polyuria or polydipsia.  Marland Kitchen.   Physical Examination Vitals:   11/17/17 0841  BP: (!) 92/58  Pulse: (!) 106  SpO2: 93%   Vitals:   11/17/17 0841  Weight: 207 lb (93.9 kg)  Height: 5\' 2"  (1.575 m)    Gen: resting comfortably, no acute distress HEENT: no scleral icterus, pupils equal round and reactive, no palptable cervical adenopathy,  CV: RRR, no m/r/g, no jvd Resp: Clear to auscultation bilaterally GI:  abdomen is soft, non-tender, non-distended, normal bowel sounds, no hepatosplenomegaly MSK: extremities are warm, no edema.  Skin: warm, no rash Neuro:  no focal deficits Psych: appropriate affect   Diagnostic Studies Jan 2016 Cath PCI Note:She was given an additional 5000 units IV heparin x 1. ACT was over 200. She was given Brilinta 180 mg po x 1.   Lesion # 1 Distal RCA: JR4 guiding catheter used to engage the RCA. [place]ougar IC wire down the RCA. 2.0 x 15 mm balloon x 1 for pre-dilatation. 2.25 x 16 mm Promus Premier DES x 1 distal RCA post-dilated with a 2.5 x 12 mm [place] balloon x 1. Stenosis taken from 90% down to  0%.   Lesion # 2 mid RCA: 2.0 x 15 mm balloon x 1. 3.0 x 12 mm Promus Premier DES x 1. The mid stenosis was followed by an aneurysmal segment with a size mismatch before the distal vessel so stents were not overlapped. Post-dilatation with a 3.5 x 8 mm [place] balloon x 1. Stenosis taken from 95% down to 0%.   Lesion #3 mid LAD: XB LAD 3.5 guiding catheter to engage left main. 2.5 x 12 mm balloon x 1 for pre-dilatation. 3.5 x 16 mm Promus Premier DES x 1 mid LAD. Stent post-dilated with a 3.5 x 8 mm [place] balloon x 2. Stenosis taken from 80% down to 0%.  The sheath was removed from the right radial artery and a Terumo hemostasis band was applied at the arteriotomy site on the right wrist. There were no immediate complications. The patient was taken to the recovery area in stable condition.   Hemodynamic Findings: Central aortic pressure: 115/66 Left ventricular pressure: 121/3/14  Angiographic Findings:  Left main: No obstructive disease.   Left Anterior Descending Artery: Large caliber vessel that courses to the apex. 80% mid stenosis. Small caliber diagonal Lennard [place]k. The distal LAD tapers to a small caliber vessel.   Circumflex Artery: Large caliber vessel with 20% proximal stenosis.   Right Coronary Artery: Large dominant vessel with diffuse 30% proximal stenosis, 95%  mid stenosis followed by an aneurysmal segment. Distal 90% stenosis. Diffuse mild plaque in the posterolateral artery and PDA.   Left Ventricular Angiogram: LVEF=55-60%.   Impression: 1. Severe double vessel CAD 2. NSTEMI 3. Normal LV systolic function 4. Successful PTCA/DES x 1 mid RCA and PTCA/DES x 1 distal RCA (stents not overlapped due to size mismatch in the vessel and the presence of an aneurysmal segment in the distal segment of the mid vessel) 5. Successful PTCA/DES x 1 mid LAD  Recommendations: Will need dual anti-platelet therapy with ASA and Brilinta for at least one year. Continue beta blocker and statin. Tobacco cessation.    04/2015 echo Study Conclusions  - Left ventricle: The cavity size was normal. Wall thickness was increased in a pattern of mild LVH. Systolic function was normal. The estimated ejection fraction was in the range of 55% to 60%. Wall motion was normal; there were no regional wall motion abnormalities. Left ventricular diastolic function parameters were normal. - Aortic valve: Mildly calcified annulus. Trileaflet; mildly thickened leaflets. Valve area (VTI): 2.07 cm^2. Valve area (Vmax): 2.2 cm^2. - Atrial septum: No defect or patent foramen ovale was identified. - Technically adequate study.  11/2015 DSE ------------------------------------------------------------------- Stress echo results: Left ventricular ejection fraction was normal at rest and with stress. Normal echo stress  05/2016 Nuclear stress [place] Severe gut uptake, limited study. No clear ischemia     Assessment and Plan   1. CAD - history of NSTEMI, s/p stenting to RCA and LAD - no recent symptoms, continue current meds  2. HTN - repeat manual check 114/70, she is at goal. Continue current meds  3. Hyperlipidemia - lipids at goal, continue statin   4. Preoperative evaluation - tolerates greater than without cardiopulmonary  symptoms, though limited by back pain. Most recent stress testing 05/2016 without ischemia - recommend proceeding with surgery, may hold ASA as needed  F/u 6 months     Antoine Poche, M.D.

## 2018-03-02 ENCOUNTER — Telehealth: Payer: Self-pay | Admitting: Neurology

## 2018-03-02 DIAGNOSIS — Z9989 Dependence on other enabling machines and devices: Principal | ICD-10-CM

## 2018-03-02 DIAGNOSIS — G4733 Obstructive sleep apnea (adult) (pediatric): Secondary | ICD-10-CM

## 2018-03-02 NOTE — Telephone Encounter (Signed)
T[facility] Lowe  [facility] called asking for verbal orders for pt's CPAP supplies.  Tanya Lowe is asking for a call back at [phone] Fax#[phone](intake fax#) if verbal order is approved Tanya Lowe said it can just be faxed because she leaves for the day at 1pm

## 2018-03-02 NOTE — Telephone Encounter (Signed)
Received VO from Dr. Frances Furbish to renew pt's cpap supplies. Order placed. Faxed to [facility]. Received a receipt of confirmation.

## 2018-03-02 NOTE — Addendum Note (Signed)
Addended by: Geronimo Running A on: 03/02/2018 01:27 PM   Modules accepted: Orders

## 2018-03-05 ENCOUNTER — Other Ambulatory Visit: Payer: Self-pay | Admitting: Physical Medicine and Rehabilitation

## 2018-03-05 ENCOUNTER — Telehealth: Payer: Self-pay | Admitting: General Practice

## 2018-03-05 DIAGNOSIS — M5412 Radiculopathy, cervical region: Secondary | ICD-10-CM

## 2018-03-05 NOTE — Telephone Encounter (Signed)
Phone call to patient to verify medication list and allergies for myelogram procedure. Pt informed to stop Buspar and Cymbalta 48hrs prior to myelogram appointment time. Pt verbalized understanding.

## 2018-03-12 ENCOUNTER — Ambulatory Visit
Admission: RE | Admit: 2018-03-12 | Discharge: 2018-03-12 | Disposition: A | Discharge status: Home / Self Care | Payer: Medicare Other | Source: Ambulatory Visit | Attending: Physical Medicine and Rehabilitation | Admitting: Physical Medicine and Rehabilitation

## 2018-03-12 DIAGNOSIS — M5412 Radiculopathy, cervical region: Secondary | ICD-10-CM

## 2018-03-12 MED ORDER — DIAZEPAM 5 MG PO TABS
10.0000 mg | ORAL_TABLET | Freq: Once | ORAL | Status: AC
  Administered 2018-03-12: 10 mg via ORAL

## 2018-03-12 MED ORDER — MEPERIDINE HCL 100 MG/ML IJ SOLN
75.0000 mg | Freq: Once | INTRAMUSCULAR | Status: AC
  Administered 2018-03-12: 75 mg via INTRAMUSCULAR

## 2018-03-12 MED ORDER — ONDANSETRON HCL 4 MG/2ML IJ SOLN: 4.0000 mg | Freq: Four times a day (QID) | INTRAMUSCULAR | Status: DC | PRN

## 2018-03-12 MED ORDER — IOPAMIDOL (ISOVUE-M 300) INJECTION 61%
10.0000 mL | Freq: Once | INTRAMUSCULAR | Status: AC | PRN
  Administered 2018-03-12: 10 mL via INTRATHECAL

## 2018-03-12 MED ORDER — ONDANSETRON HCL 4 MG/2ML IJ SOLN
4.0000 mg | Freq: Once | INTRAMUSCULAR | Status: AC
  Administered 2018-03-12: 4 mg via INTRAMUSCULAR

## 2018-03-12 NOTE — Discharge Instructions (Signed)
Myelogram Discharge Instructions  1. Go home and rest quietly for the next 24 hours.  It is important to lie flat for the next 24 hours.  Get up only to go to the restroom.  You may lie in the bed or on a couch on your back, your stomach, your left side or your right side.  You may have one pillow under your head.  You may have pillows between your knees while you are on your side or under your knees while you are on your back.  2. DO NOT drive today.  Recline the seat as far back as it will go, while still wearing your seat belt, on the way home.  3. You may get up to go to the bathroom as needed.  You may sit up for 10 minutes to eat.  You may resume your normal diet and medications unless otherwise indicated.  Drink lots of extra fluids today and tomorrow.  4. The incidence of headache, nausea, or vomiting is about 5% (one in 20 patients).  If you develop a headache, lie flat and drink plenty of fluids until the headache goes away.  Caffeinated beverages may be helpful.  If you develop severe nausea and vomiting or a headache that does not go away with flat bed rest, call [phone].  5. You may resume normal activities after your 24 hours of bed rest is over; however, do not exert yourself strongly or do any heavy lifting tomorrow. If when you get up you have a headache when standing, go back to bed and force fluids for another 24 hours.  6. Call your physician for a follow-up appointment.  The results of your myelogram will be sent directly to your physician by the following day.  7. If you have any questions or if complications develop after you arrive home, please call [phone].  Discharge instructions have been explained to the patient.  The patient, or the person responsible for the patient, fully understands these instructions.   YOU MAY RESTART YOUR BUSPAR AND CYMBALTA TOMORROW 03/13/2018 AT 09:30AM.

## 2018-08-30 ENCOUNTER — Telehealth: Payer: Self-pay

## 2018-08-30 ENCOUNTER — Ambulatory Visit: Payer: Medicare Other | Admitting: Neurology

## 2018-08-30 ENCOUNTER — Ambulatory Visit: Payer: Medicare Other | Admitting: Adult Health

## 2018-08-30 NOTE — Telephone Encounter (Signed)
Pt did not show for their appt with Dr. Athar today.  

## 2018-08-31 ENCOUNTER — Encounter: Payer: Self-pay | Admitting: Neurology

## 2018-09-14 ENCOUNTER — Encounter: Payer: Self-pay | Admitting: Neurology

## 2018-09-14 ENCOUNTER — Ambulatory Visit (INDEPENDENT_AMBULATORY_CARE_PROVIDER_SITE_OTHER): Payer: Medicare Other | Admitting: Neurology

## 2018-09-14 VITALS — BP 125/78 | HR 85 | Ht 62.0 in | Wt 193.0 lb

## 2018-09-14 DIAGNOSIS — G4733 Obstructive sleep apnea (adult) (pediatric): Secondary | ICD-10-CM

## 2018-09-14 DIAGNOSIS — Z9989 Dependence on other enabling machines and devices: Secondary | ICD-10-CM

## 2018-09-14 DIAGNOSIS — I2510 Atherosclerotic heart disease of native coronary artery without angina pectoris: Secondary | ICD-10-CM | POA: Diagnosis not present

## 2018-09-14 NOTE — Progress Notes (Signed)
Subjective:    Patient ID: [name] is a 47 y.o. female.  HPI     Interim history:   Tanya Lowe is a 47 year old right-handed woman with an underlying medical history of smoking, low back pain, on chronic narcotic pain medication, fibromyalgia, anxiety, and obesity, who presents for follow up consultation of her sleep apnea, established on CPAP therapy. The patient is unaccompanied today, she missed an appointment on 08/30/2018. I last saw her on 08/26/2017, at which time she was compliant with her CPAP. She was advised to get new supplies from her DME company. She was advised to follow-up routinely in one year.   Today, 09/14/2018: I reviewed her CPAP compliance data from 07/27/2014 through 08/25/2018 which is a total of 30 days, during which time she used her CPAP every night with percent used days greater than 4 hours at 80%, indicating very good compliance with an average usage of 5 hours and 3 minutes, residual AHI suboptimal at 8.3 per hour, leak high with the 95th percentile at 97.6 L/m on a pressure of 9 cm with EPR of 2. She reports having difficulty with mouth dryness, tries to hydrate well with water. She pulls off the mask at times without realizing it. She has reduced her soda intake, has in fact lost weight, about 13 pounds compared to last year. She has not yet been able to quit smoking. She uses a small Simplus fullface mask. She would be open to trying a different mask.   The patient's allergies, current medications, family history, past medical history, past social history, past surgical history and problem list were reviewed and updated as appropriate.    Previously:  I first met her on 11/25/16 at the request of her PCP, at which time she reported snoring and EDS. I suggested we proceed with sleep study testing. She had a baseline sleep study, followed by a CPAP titration. I went over her test results with her in detail today. Her baseline sleep study from 12/11/16 showed a sleep  efficiency of 89.4%, sleep latency of 9 minutes, REM latency of 126.5 min. She had a fairly normal breakdown of her sleep stages. Overall AHI was 10.8/hour, REM AHI of 9.7/hour supine AHI of 65/hour. Baseline 02 saturation was 95%, with the lowest being 86%. Time spent below 89% saturation equaled 27 minutes. She had some PLMs. Based on her test results, I suggested treatment with CPAP. She had a CPAP titration on 12/24/16: sleep efficiency was 83.1%, sleep latency of 12 minutes, REM latency of 187.5 minutes. Stage N3 was 28.1%, which is mildly increased, and Stage R (REM sleep) was 16.3%, which is mildly decreased. The patient was fitted with small P10 nasal pillows. CPAP was titrated from 5 cm to 9 cm. On the final pressure, her AHI was 0/h, O2 nadir of 92%, REM sleep achieved. Baseline 02 saturation was 96%, with the lowest being 89%. No significant PLMs noted. Based on her test results, I prescribed CPAP for home use.    She saw Tanya Givens, NP on 04/01/17, at which time she was fully compliant with CPAP.   I reviewed her CPAP compliance data from 07/25/17 through 08/23/17, which is a total of 30 days, during which time she used her CPAP 26 days with percent used days greater than 4 hours at 70%, indicating adequate compliance with an average usage of 4 hours and 48 minutes, residual AHI suboptimal at 9.1 per hour, leak consistently high with the 95th percentile at 85 L/m  on a pressure of 9 cm with EPR of 2.    11/25/16: (She) reports snoring and excessive daytime somnolence. I reviewed her office note from 10/30/2016, which you kindly included. She has undergone epidural steroid injections. She avoids taking opioids after 6 PM as instructed. She smokes one pack per day, she is trying to quit, she endorses stress. She is on disability. She used to work as a Artist. She has 2 grown children. She lives with her boyfriend. She drinks no alcohol, 1 serving of caffeine per day. Bedtime is around 10 PM. She  watches TV in bed and puts the TV on a timer. Wakeup time is typically between 4 and 5 AM. She does not sleep longer than this typically. She denies night to night nocturia but has occasional morning headaches. Of note, her mother and her sister both have sleep apnea and uses CPAP machines. She endorses restless leg symptoms and feels that the combination of gabapentin and Cymbalta has been helpful for this. She has a history of leg twitching in her sleep and is a light sleeper. Of note, she takes Gabapentin 1200 mg tid, Cymbalta 60 mg once daily, buspar 7.5 mg bid, Zanaflex 4 mg tid.    Her Epworth sleepiness score is 6 out of 24 today, her fatigue score is 14 out of 63.  Her Past Medical History Is Significant For: Past Medical History:  Diagnosis Date  . Arthritis   . Bilateral hand pain   . Cervical radiculopathy   . Chronic neck and back pain   . Fibromyalgia   . Hypertension   . Kidney stones    hx of w previous lithotripsy  . MI (myocardial infarction) ([place])   . Pain management    [facility]  . Thoracic back pain     Her Past Surgical History Is Significant For: Past Surgical History:  Procedure Laterality Date  . CARDIAC SURGERY    . CYSTOSCOPY WITH RETROGRADE PYELOGRAM, URETEROSCOPY AND STENT PLACEMENT Left 03/14/2015   Procedure: CYSTOSCOPY WITH LEFT RETROGRADE PYELOGRAM;  Surgeon: Tanya Loser, MD;  Location: [facility];  Service: Urology;  Laterality: Left;  . CYSTOSCOPY WITH RETROGRADE PYELOGRAM, URETEROSCOPY AND STENT PLACEMENT Left 03/20/2015   Procedure: CYSTOSCOPY WITH LEFT  RETROGRADE LEFT URETEROSCOPY AND LEFT  STENT ;  Surgeon: Tanya Seal, MD;  Location: [facility];  Service: Urology;  Laterality: Left;  . HOLMIUM LASER APPLICATION Left 3/41/9622   Procedure: HOLMIUM LASER APPLICATION;  Surgeon: Tanya Seal, MD;  Location: [facility];  Service: Urology;  Laterality: Left;  . LEFT HEART CATHETERIZATION WITH CORONARY ANGIOGRAM N/A 10/25/2014   Procedure: LEFT HEART CATHETERIZATION WITH  CORONARY ANGIOGRAM;  Surgeon: Tanya Blanks, MD;  Location: [facility] CATH LAB;  Service: Cardiovascular;  Laterality: N/A;  . LITHOTRIPSY  about 2011  . SPINAL FUSION     L5-S1  . TUBAL LIGATION      Her Family History Is Significant For: Family History  Problem Relation Age of Onset  . Diabetes Mother   . Stroke Mother   . Seizures Other   . Cancer Other   . Diabetes Other     Her Social History Is Significant For: Social History   Socioeconomic History  . Marital status: Divorced    Spouse name: Not on file  . Number of children: Not on file  . Years of education: Not on file  . Highest education level: Not on file  Occupational History  . Not on file  Social Needs  .  Financial resource strain: Not on file  . Food insecurity:    Worry: Not on file    Inability: Not on file  . Transportation needs:    Medical: Not on file    Non-medical: Not on file  Tobacco Use  . Smoking status: Current Every Day Smoker    Packs/day: 1.00    Years: 24.00    Pack years: 24.00    Types: Cigarettes    Start date: 10/25/1990  . Smokeless tobacco: Never Used  Substance and Sexual Activity  . Alcohol use: No    Alcohol/week: 0.0 standard drinks  . Drug use: No  . Sexual activity: Not on file  Lifestyle  . Physical activity:    Days per week: Not on file    Minutes per session: Not on file  . Stress: Not on file  Relationships  . Social connections:    Talks on phone: Not on file    Gets together: Not on file    Attends religious service: Not on file    Active member of club or organization: Not on file    Attends meetings of clubs or organizations: Not on file    Relationship status: Not on file  Other Topics Concern  . Not on file  Social History Narrative  . Not on file    Her Allergies Are:  Allergies  Allergen Reactions  . Lyrica [Pregabalin] Shortness Of Breath    Chest Pain   . Bactrim [Sulfamethoxazole-Trimethoprim]     rash  . Prednisone Hypertension     Tachycardia   :   Her Current Medications Are:  Outpatient Encounter Medications as of 09/14/2018  Medication Sig  . acetaminophen (TYLENOL) 500 MG tablet Take 1,000 mg by mouth every 6 (six) hours as needed for mild pain.   Marland Kitchen aspirin EC 81 MG tablet Take 81 mg by mouth daily.  Marland Kitchen atorvastatin (LIPITOR) 80 MG tablet Take 1 tablet (80 mg total) by mouth daily at 6 PM.  . busPIRone (BUSPAR) 7.5 MG tablet Take 7.5 mg by mouth 2 (two) times daily.  . DULoxetine (CYMBALTA) 60 MG capsule Take 60 mg by mouth daily.  . fluticasone (FLONASE) 50 MCG/ACT nasal spray Place 2 sprays into both nostrils daily.  Marland Kitchen gabapentin (NEURONTIN) 600 MG tablet Take 1,200 mg by mouth 3 (three) times daily.  Marland Kitchen lisinopril (PRINIVIL,ZESTRIL) 2.5 MG tablet Take 1 tablet (2.5 mg total) by mouth daily.  . metoprolol tartrate (LOPRESSOR) 25 MG tablet Take 1 tablet (25 mg total) by mouth 2 (two) times daily.  . nitroGLYCERIN (NITROSTAT) 0.4 MG SL tablet Place 1 tablet (0.4 mg total) under the tongue every 5 (five) minutes as needed for chest pain.  Marland Kitchen omeprazole (PRILOSEC) 20 MG capsule Take 20 mg by mouth daily.  Marland Kitchen oxyCODONE-acetaminophen (PERCOCET) 10-325 MG per tablet Take 1 tablet by mouth 3 (three) times daily as needed for pain.  Marland Kitchen tiZANidine (ZANAFLEX) 4 MG tablet Take 4 mg by mouth every 8 (eight) hours as needed for muscle spasms.    No facility-administered encounter medications on file as of 09/14/2018.   :  Review of Systems:  Out of a complete 14 point review of systems, all are reviewed and negative with the exception of these symptoms as listed below: Review of Systems  Neurological:       Pt presents today to discuss her cpap. She says that she takes it off during the night without knowing it.    Objective:  Neurological Exam  Physical Exam Physical Examination:   Vitals:   09/14/18 0905  BP: 125/78  Pulse: 85    General Examination: The patient is a very pleasant 47 y.o. female in no acute  distress. She appears well-developed and well-nourished and well groomed.   HEENT:Normocephalic, atraumatic, pupils are equal, round and reactive to light and accommodation. Wears corrective eyeglasses. Extraocular tracking is good without limitation to gaze excursion or nystagmus noted. Normal smooth pursuit is noted. Hearing is grossly intact. Face is symmetric with normal facial animation and normal facial sensation. Speech is clear with no dysarthria noted, mild hoarseness, no hypophonia. There is no lip, neck/head, jaw or voice tremor. Neck with FROM. Oropharynx exam reveals: mild to moderatemouth dryness, adequatedental hygiene with dentures in place, and moderateairway crowding. Tongue protrudes centrally and palate elevates symmetrically   Chest:Clear to auscultation without wheezing, rhonchi or crackles noted.  Heart:S1+S2+0, regular and normal without murmurs, rubs or gallops noted.   Abdomen:Soft, non-tender and non-distended with normal bowel sounds appreciated on auscultation.  Extremities:There isnopitting edema in the distal lower extremities bilaterally.  Skin: Warm and dry without trophic changes noted. There are novaricose veins.  Musculoskeletal: exam reveals no obvious joint deformities, tenderness or joint swelling or erythema, with the exception of achiness reported and low back pain with standing.   Neurologically:  Mental status: The patient is awake, alert and oriented in all 4 spheres.Herimmediate and remote memory, attention, language skills and fund of knowledge are appropriate. There is no evidence of aphasia, agnosia, apraxia or anomia. Speech is clear with normal prosody and enunciation. Thought process is linear. Mood is normaland affect is normal.  Cranial nerves II - XII are as described above under HEENT exam. Motor exam: Normal bulk, strength and tone is noted. There is no tremor. Fine motor skills and coordination: grossly intact.   Cerebellar testing: No dysmetria or intention tremor. There is no truncal or gait ataxia.  Sensory exam: intact to light touch.  Gait, station and balance:Shestands withmilddifficulty. No veering to one side is noted. No leaning to one side is noted. Posture is age-appropriate and stance is narrow based. Gait showsnormalstride length and normalpace. No problems turning are noted.  Assessmentand Plan:  In summary,Tanya Lowe a very pleasant 60 year oldfemalewith an underlying medical history of smoking, low back pain, s/p pain stimulator implantation, on chronic narcotic pain medication, fibromyalgia, anxiety, and obesity, who presents for follow-up consultation of her obstructive sleep apnea, established on CPAP therapy. She has been compliant with treatment but has issues with mask fit. She uses a small full face mask. She is advised to stop by in the sleep lab for a mask refit before she leaves today. She is commended for treatment adherence. She is strongly advised to quit smoking. She has been able to lose some weight and is encouraged to continue to work on weight loss. She is advised to follow-up for sleep apnea routinely in one year, she can see Vaughan Browner, nurse practitioner next time. I answered all her questions today and she was in agreement. I spent 20 minutes in total face-to-face time with the patient, more than 50% of which was spent in counseling and coordination of care, reviewing test results, reviewing medication and discussing or reviewing the diagnosis of OSA its prognosis and treatment options. Pertinent laboratory and imaging test results that were available during this visit with the patient were reviewed by me and considered in my medical decision making (see chart for details).

## 2018-09-14 NOTE — Patient Instructions (Addendum)
Please continue using your CPAP regularly. While your insurance requires that you use CPAP at least 4 hours each night on 70% of the nights, I recommend, that you not skip any nights and use it throughout the night if you can. Getting used to CPAP and staying with the treatment long term does take time and patience and discipline. Untreated obstructive sleep apnea when it is moderate to severe can have an adverse impact on cardiovascular health and raise her risk for heart disease, arrhythmias, hypertension, congestive heart failure, stroke and diabetes. Untreated obstructive sleep apnea causes sleep disruption, nonrestorative sleep, and sleep deprivation. This can have an impact on your day to day functioning and cause daytime sleepiness and impairment of cognitive function, memory loss, mood disturbance, and problems focussing. Using CPAP regularly can improve these symptoms.  Keep up the good work! We can see you in 1 year, you can see one of our nurse practitioners as you are stable.   We will have you stop in the sleep lab to see Zella BallRobin for a mask refit/troubleshooting mask issues.

## 2018-11-18 ENCOUNTER — Ambulatory Visit (INDEPENDENT_AMBULATORY_CARE_PROVIDER_SITE_OTHER): Payer: Medicare Other | Admitting: Cardiology

## 2018-11-18 ENCOUNTER — Encounter: Payer: Self-pay | Admitting: Cardiology

## 2018-11-18 VITALS — BP 112/70 | HR 76 | Ht 62.5 in | Wt 188.0 lb

## 2018-11-18 DIAGNOSIS — R079 Chest pain, unspecified: Secondary | ICD-10-CM | POA: Diagnosis not present

## 2018-11-18 DIAGNOSIS — E782 Mixed hyperlipidemia: Secondary | ICD-10-CM | POA: Diagnosis not present

## 2018-11-18 DIAGNOSIS — I10 Essential (primary) hypertension: Secondary | ICD-10-CM | POA: Diagnosis not present

## 2018-11-18 DIAGNOSIS — I2510 Atherosclerotic heart disease of native coronary artery without angina pectoris: Secondary | ICD-10-CM | POA: Diagnosis not present

## 2018-11-18 NOTE — Progress Notes (Signed)
Clinical Summary Ms. Bennion is a 48 y.o.female  seen today for follow up of the following medical problems.   1. CAD - history of NSTEMI Jan 2016, s/p DES to mid RCA and DES to distal RCA and DES to mid LAD.  - 04/2015 echo LVEF 55-60% -11/2015 DSE without ischemia - 05/2016 nuclear stress [place] no clear ischemia, limited by signicant gut radiotrace uptake    - can be sharp/nagging pain chest pain, mid to right sided. Most often with rest, no other associated symptoms. Lasts a few minutes. Occurs few times a week. Numbness bother arms R>L. Not positional.  - has not tried nitro. Can be better with additional with ASA. - mild SOB with activities. Not related to eating.   - cannot run on treadmill due to back pain.    2. HTN -compliant with meds  3. Hyperlipidemia 07/2017 TC 103 TG 106 HDL 39 LDL 45 -- labs followed by pcp  4. OSA - followed by Dr Frances Furbish neurology - compliant with cpap, started nearly year go.   5. Chronic back pain - prior nerve stimulator surgery   Past Medical History:  Diagnosis Date  . Arthritis   . Bilateral hand pain   . Cervical radiculopathy   . Chronic neck and back pain   . Fibromyalgia   . Hypertension   . Kidney stones    hx of w previous lithotripsy  . MI (myocardial infarction) (HCC)   . Pain management    [facility]  . Thoracic back pain      Allergies  Allergen Reactions  . Lyrica [Pregabalin] Shortness Of Breath    Chest Pain   . Bactrim [Sulfamethoxazole-Trimethoprim]     rash  . Prednisone Hypertension    Tachycardia      Current Outpatient Medications  Medication Sig Dispense Refill  . acetaminophen (TYLENOL) 500 MG tablet Take 1,000 mg by mouth every 6 (six) hours as needed for mild pain.     Marland Kitchen aspirin EC 81 MG tablet Take 81 mg by mouth daily.    Marland Kitchen atorvastatin (LIPITOR) 80 MG tablet Take 1 tablet (80 mg total) by mouth daily at 6 PM. 30 tablet 11  . busPIRone (BUSPAR) 7.5 MG tablet Take 7.5 mg by mouth 2  (two) times daily.    . DULoxetine (CYMBALTA) 60 MG capsule Take 60 mg by mouth daily.    . fluticasone (FLONASE) 50 MCG/ACT nasal spray Place 2 sprays into both nostrils daily.    Marland Kitchen gabapentin (NEURONTIN) 600 MG tablet Take 1,200 mg by mouth 3 (three) times daily.    Marland Kitchen lisinopril (PRINIVIL,ZESTRIL) 2.5 MG tablet Take 1 tablet (2.5 mg total) by mouth daily. 90 tablet 3  . metoprolol tartrate (LOPRESSOR) 25 MG tablet Take 1 tablet (25 mg total) by mouth 2 (two) times daily. 180 tablet 3  . nitroGLYCERIN (NITROSTAT) 0.4 MG SL tablet Place 1 tablet (0.4 mg total) under the tongue every 5 (five) minutes as needed for chest pain. 25 tablet 3  . omeprazole (PRILOSEC) 20 MG capsule Take 20 mg by mouth daily.    Marland Kitchen oxyCODONE-acetaminophen (PERCOCET) 10-325 MG per tablet Take 1 tablet by mouth 3 (three) times daily as needed for pain.    Marland Kitchen tiZANidine (ZANAFLEX) 4 MG tablet Take 4 mg by mouth every 8 (eight) hours as needed for muscle spasms.      No current facility-administered medications for this visit.      Past Surgical History:  Procedure Laterality  Date  . CARDIAC SURGERY    . CYSTOSCOPY WITH RETROGRADE PYELOGRAM, URETEROSCOPY AND STENT PLACEMENT Left 03/14/2015   Procedure: CYSTOSCOPY WITH LEFT RETROGRADE PYELOGRAM;  Surgeon: Alfredo MartinezScott MacDiarmid, MD;  Location: [facility];  Service: Urology;  Laterality: Left;  . CYSTOSCOPY WITH RETROGRADE PYELOGRAM, URETEROSCOPY AND STENT PLACEMENT Left 03/20/2015   Procedure: CYSTOSCOPY WITH LEFT  RETROGRADE LEFT URETEROSCOPY AND LEFT  STENT ;  Surgeon: Bjorn PippinJohn Wrenn, MD;  Location: [facility];  Service: Urology;  Laterality: Left;  . HOLMIUM LASER APPLICATION Left 03/20/2015   Procedure: HOLMIUM LASER APPLICATION;  Surgeon: Bjorn PippinJohn Wrenn, MD;  Location: [facility];  Service: Urology;  Laterality: Left;  . LEFT HEART CATHETERIZATION WITH CORONARY ANGIOGRAM N/A 10/25/2014   Procedure: LEFT HEART CATHETERIZATION WITH CORONARY ANGIOGRAM;  Surgeon: Kathleene Hazelhristopher D McAlhany, MD;  [facility];  Service: Cardiovascular;  Laterality: N/A;  . LITHOTRIPSY  about 2011  . SPINAL FUSION     L5-S1  . TUBAL LIGATION       Allergies  Allergen Reactions  . Lyrica [Pregabalin] Shortness Of Breath    Chest Pain   . Bactrim [Sulfamethoxazole-Trimethoprim]     rash  . Prednisone Hypertension    Tachycardia       Family History  Problem Relation Age of Onset  . Diabetes Mother   . Stroke Mother   . Seizures Other   . Cancer Other   . Diabetes Other      Social History Ms. Ottis StainHuff reports that she has been smoking cigarettes. She started smoking about 28 years ago. She has a 24.00 pack-year smoking history. She has never used smokeless tobacco. Ms. Ottis StainHuff reports no history of alcohol use.   Review of Systems CONSTITUTIONAL: No weight loss, fever, chills, weakness or fatigue.  HEENT: Eyes: No visual loss, blurred vision, double vision or yellow sclerae.No hearing loss, sneezing, congestion, runny nose or sore throat.  SKIN: No rash or itching.  CARDIOVASCULAR: per hpi RESPIRATORY: No shortness of breath, cough or sputum.  GASTROINTESTINAL: No anorexia, nausea, vomiting or diarrhea. No abdominal pain or blood.  GENITOURINARY: No burning on urination, no polyuria NEUROLOGICAL: No headache, dizziness, syncope, paralysis, ataxia, numbness or tingling in the extremities. No change in bowel or bladder control.  MUSCULOSKELETAL: No muscle, back pain, joint pain or stiffness.  LYMPHATICS: No enlarged nodes. No history of splenectomy.  PSYCHIATRIC: No history of depression or anxiety.  ENDOCRINOLOGIC: No reports of sweating, cold or heat intolerance. No polyuria or polydipsia.  Marland Kitchen.   Physical Examination Vitals:   11/18/18 0811  BP: 112/70  Pulse: 76  SpO2: 99%   Vitals:   11/18/18 0811  Weight: 188 lb (85.3 kg)  Height: 5' 2.5" (1.588 m)    Gen: resting comfortably, no acute distress HEENT: no scleral icterus, pupils equal round and reactive, no palptable  cervical adenopathy,  CV: RRR, no m/r/g, no jvd Resp: Clear to auscultation bilaterally GI: abdomen is soft, non-tender, non-distended, normal bowel sounds, no hepatosplenomegaly MSK: extremities are warm, no edema.  Skin: warm, no rash Neuro:  no focal deficits Psych: appropriate affect   Diagnostic Studies Jan 2016 Cath PCI Note:She was given an additional 5000 units IV heparin x 1. ACT was over 200. She was given Brilinta 180 mg po x 1.   Lesion # 1 Distal RCA: JR4 guiding catheter used to engage the RCA. Cougar IC wire down the RCA. 2.0 x 15 mm balloon x 1 for pre-dilatation. 2.25 x 16 mm Promus Premier DES x  1 distal RCA post-dilated with a 2.5 x 12 mm [place] balloon x 1. Stenosis taken from 90% down to 0%.   Lesion # 2 mid RCA: 2.0 x 15 mm balloon x 1. 3.0 x 12 mm Promus Premier DES x 1. The mid stenosis was followed by an aneurysmal segment with a size mismatch before the distal vessel so stents were not overlapped. Post-dilatation with a 3.5 x 8 mm [place] balloon x 1. Stenosis taken from 95% down to 0%.   Lesion #3 mid LAD: XB LAD 3.5 guiding catheter to engage left main. 2.5 x 12 mm balloon x 1 for pre-dilatation. 3.5 x 16 mm Promus Premier DES x 1 mid LAD. Stent post-dilated with a 3.5 x 8 mm [place] balloon x 2. Stenosis taken from 80% down to 0%.  The sheath was removed from the right radial artery and a Terumo hemostasis band was applied at the arteriotomy site on the right wrist. There were no immediate complications. The patient was taken to the recovery area in stable condition.   Hemodynamic Findings: Central aortic pressure: 115/66 Left ventricular pressure: 121/3/14  Angiographic Findings:  Left main: No obstructive disease.   Left Anterior Descending Artery: Large caliber vessel that courses to the apex. 80% mid stenosis. Small caliber diagonal [place]. The distal LAD tapers to a small caliber vessel.   Circumflex Artery: Large caliber vessel with 20% proximal  stenosis.   Right Coronary Artery: Large dominant vessel with diffuse 30% proximal stenosis, 95% mid stenosis followed by an aneurysmal segment. Distal 90% stenosis. Diffuse mild plaque in the posterolateral artery and PDA.   Left Ventricular Angiogram: LVEF=55-60%.   Impression: 1. Severe double vessel CAD 2. NSTEMI 3. Normal LV systolic function 4. Successful PTCA/DES x 1 mid RCA and PTCA/DES x 1 distal RCA (stents not overlapped due to size mismatch in the vessel and the presence of an aneurysmal segment in the distal segment of the mid vessel) 5. Successful PTCA/DES x 1 mid LAD  Recommendations: Will need dual anti-platelet therapy with ASA and Brilinta for at least one year. Continue beta blocker and statin. Tobacco cessation.    04/2015 echo Study Conclusions  - Left ventricle: The cavity size was normal. Wall thickness was increased in a pattern of mild LVH. Systolic function was normal. The estimated ejection fraction was in the range of 55% to 60%. Wall motion was normal; there were no regional wall motion abnormalities. Left ventricular diastolic function parameters were normal. - Aortic valve: Mildly calcified annulus. Trileaflet; mildly thickened leaflets. Valve area (VTI): 2.07 cm^2. Valve area (Vmax): 2.2 cm^2. - Atrial septum: No defect or patent foramen ovale was identified. - Technically adequate study.  11/2015 DSE ------------------------------------------------------------------- Stress echo results: Left ventricular ejection fraction was normal at rest and with stress. Normal echo stress  05/2016 Nuclear stress [place] Severe gut uptake, limited study. No clear ischemia    Assessment and Plan  1. CAD - history of NSTEMI, s/p stenting to RCA and LAD - recent chest pain symptoms of unclear etiology. We will plan for a lexiscan to evaluate for underlying ishcemia  2. HTN - at goal, cotninue current meds  3.  Hyperlipidemia -request labs from pcp, conitnue current meds.      F/u pending stress results      Antoine PocheJonathan F. Fannie Gathright, M.D.

## 2018-11-18 NOTE — Patient Instructions (Signed)
Medication Instructions:  Your physician recommends that you continue on your current medications as directed. Please refer to the Current Medication list given to you today.   Labwork: I will request labs from pcp  Testing/Procedures: Your physician has requested that you have a lexiscan myoview. For further information please visit [email or web address]. Please follow instruction sheet, as given.    Follow-Up: Your physician recommends that you schedule a follow-up appointment in: pending test results    Any Other Special Instructions Will Be Listed Below (If Applicable).     If you need a refill on your cardiac medications before your next appointment, please call your pharmacy.

## 2018-11-29 ENCOUNTER — Encounter (HOSPITAL_COMMUNITY)
Admission: RE | Admit: 2018-11-29 | Discharge: 2018-11-29 | Disposition: A | Discharge status: Home / Self Care | Payer: Medicare Other | Source: Ambulatory Visit | Attending: Cardiology | Admitting: Cardiology

## 2018-11-29 ENCOUNTER — Encounter (HOSPITAL_BASED_OUTPATIENT_CLINIC_OR_DEPARTMENT_OTHER)
Admission: RE | Admit: 2018-11-29 | Discharge: 2018-11-29 | Disposition: A | Discharge status: Home / Self Care | Payer: Medicare Other | Source: Ambulatory Visit | Attending: Cardiology | Admitting: Cardiology

## 2018-11-29 DIAGNOSIS — R079 Chest pain, unspecified: Secondary | ICD-10-CM | POA: Insufficient documentation

## 2018-11-29 LAB — NM MYOCAR MULTI W/SPECT W/WALL MOTION / EF
CHL CUP NUCLEAR SSS: 1
LV dias vol: 59 mL (ref 46–106)
LV sys vol: 26 mL
Peak HR: 96 {beats}/min
RATE: 0.51
Rest HR: 63 {beats}/min
SDS: 1
SRS: 0
TID: 1.65

## 2018-11-29 MED ORDER — TECHNETIUM TC 99M TETROFOSMIN IV KIT
10.0000 | PACK | Freq: Once | INTRAVENOUS | Status: AC | PRN
Start: 2018-11-29 — End: 2018-11-29
  Administered 2018-11-29: 11 via INTRAVENOUS

## 2018-11-29 MED ORDER — REGADENOSON 0.4 MG/5ML IV SOLN
INTRAVENOUS | Status: AC
  Administered 2018-11-29: 0.4 mg via INTRAVENOUS
  Filled 2018-11-29: qty 5

## 2018-11-29 MED ORDER — TECHNETIUM TC 99M TETROFOSMIN IV KIT
30.0000 | PACK | Freq: Once | INTRAVENOUS | Status: AC | PRN
  Administered 2018-11-29: 31 via INTRAVENOUS

## 2018-11-29 MED ORDER — SODIUM CHLORIDE 0.9% FLUSH
INTRAVENOUS | Status: AC
  Administered 2018-11-29: 10 mL via INTRAVENOUS
  Filled 2018-11-29: qty 10

## 2018-12-08 ENCOUNTER — Other Ambulatory Visit: Payer: Self-pay | Admitting: Neurosurgery

## 2018-12-24 ENCOUNTER — Ambulatory Visit: Payer: Medicare Other | Admitting: Cardiology

## 2018-12-24 NOTE — Progress Notes (Deleted)
Clinical Summary Ms. Favaro is a 48 y.o.female seen today for follow up of the following medical problems.   1. CAD - history of NSTEMI Jan 2016, s/p DES to mid RCA and DES to distal RCA and DES to mid LAD.  - 04/2015 echo LVEF 55-60% -11/2015 DSE without ischemia - 05/2016 nuclear stress [place] no clear ischemia, limited by signicant gut radiotrace uptake    - can be sharp/nagging pain chest pain, mid to right sided. Most often with rest, no other associated symptoms. Lasts a few minutes. Occurs few times a week. Numbness bother arms R>L. Not positional.  - has not tried nitro. Can be better with additional with ASA. - mild SOB with activities. Not related to eating.   - cannot run on treadmill due to back pain.    11/2018 nuclear stress: no ischemia    2. HTN -compliant with meds  3. Hyperlipidemia 07/2017 TC 103 TG 106 HDL 39 LDL 45 -- labs followed by pcp  4. OSA - followed by Dr Frances Furbish neurology - compliant with cpap, started nearly year go.   5. Chronic back pain - prior nerve stimulator surgery    Past Medical History:  Diagnosis Date  . Arthritis   . Bilateral hand pain   . Cervical radiculopathy   . Chronic neck and back pain   . Fibromyalgia   . Hypertension   . Kidney stones    hx of w previous lithotripsy  . MI (myocardial infarction) (HCC)   . Pain management    [facility]  . Thoracic back pain      Allergies  Allergen Reactions  . Lyrica [Pregabalin] Shortness Of Breath    Chest Pain   . Bactrim [Sulfamethoxazole-Trimethoprim]     rash  . Prednisone Hypertension    Tachycardia      Current Outpatient Medications  Medication Sig Dispense Refill  . acetaminophen (TYLENOL) 500 MG tablet Take 1,000 mg by mouth every 6 (six) hours as needed for mild pain.     Marland Kitchen aspirin EC 81 MG tablet Take 81 mg by mouth daily.    Marland Kitchen atorvastatin (LIPITOR) 80 MG tablet Take 1 tablet (80 mg total) by mouth daily at 6 PM. 30 tablet 11  . busPIRone  (BUSPAR) 7.5 MG tablet Take 7.5 mg by mouth 2 (two) times daily.    . DULoxetine (CYMBALTA) 60 MG capsule Take 60 mg by mouth daily.    . fluticasone (FLONASE) 50 MCG/ACT nasal spray Place 2 sprays into both nostrils daily.    Marland Kitchen gabapentin (NEURONTIN) 600 MG tablet Take 1,200 mg by mouth 3 (three) times daily.    Marland Kitchen lisinopril (PRINIVIL,ZESTRIL) 2.5 MG tablet Take 1 tablet (2.5 mg total) by mouth daily. 90 tablet 3  . metoprolol tartrate (LOPRESSOR) 25 MG tablet Take 1 tablet (25 mg total) by mouth 2 (two) times daily. 180 tablet 3  . nitroGLYCERIN (NITROSTAT) 0.4 MG SL tablet Place 1 tablet (0.4 mg total) under the tongue every 5 (five) minutes as needed for chest pain. 25 tablet 3  . omeprazole (PRILOSEC) 20 MG capsule Take 20 mg by mouth daily.    Marland Kitchen oxyCODONE-acetaminophen (PERCOCET) 10-325 MG per tablet Take 1 tablet by mouth 3 (three) times daily as needed for pain.    Marland Kitchen tiZANidine (ZANAFLEX) 4 MG tablet Take 4 mg by mouth every 8 (eight) hours as needed for muscle spasms.      No current facility-administered medications for this visit.  Past Surgical History:  Procedure Laterality Date  . CARDIAC SURGERY    . CYSTOSCOPY WITH RETROGRADE PYELOGRAM, URETEROSCOPY AND STENT PLACEMENT Left 03/14/2015   Procedure: CYSTOSCOPY WITH LEFT RETROGRADE PYELOGRAM;  Surgeon: Alfredo Martinez, MD;  Location: [facility];  Service: Urology;  Laterality: Left;  . CYSTOSCOPY WITH RETROGRADE PYELOGRAM, URETEROSCOPY AND STENT PLACEMENT Left 03/20/2015   Procedure: CYSTOSCOPY WITH LEFT  RETROGRADE LEFT URETEROSCOPY AND LEFT  STENT ;  Surgeon: Bjorn Pippin, MD;  Location: [facility];  Service: Urology;  Laterality: Left;  . HOLMIUM LASER APPLICATION Left 03/20/2015   Procedure: HOLMIUM LASER APPLICATION;  Surgeon: Bjorn Pippin, MD;  Location: [facility];  Service: Urology;  Laterality: Left;  . LEFT HEART CATHETERIZATION WITH CORONARY ANGIOGRAM N/A 10/25/2014   Procedure: LEFT HEART CATHETERIZATION WITH CORONARY ANGIOGRAM;   Surgeon: Kathleene Hazel, MD;  Location[facility] CATH LAB;  Service: Cardiovascular;  Laterality: N/A;  . LITHOTRIPSY  about 2011  . SPINAL FUSION     L5-S1  . TUBAL LIGATION       Allergies  Allergen Reactions  . Lyrica [Pregabalin] Shortness Of Breath    Chest Pain   . Bactrim [Sulfamethoxazole-Trimethoprim]     rash  . Prednisone Hypertension    Tachycardia       Family History  Problem Relation Age of Onset  . Diabetes Mother   . Stroke Mother   . Seizures Other   . Cancer Other   . Diabetes Other      Social History Ms. Eke reports that she has been smoking cigarettes. She started smoking about 28 years ago. She has a 18.00 pack-year smoking history. She has never used smokeless tobacco. Ms. Jasin reports no history of alcohol use.   Review of Systems CONSTITUTIONAL: No weight loss, fever, chills, weakness or fatigue.  HEENT: Eyes: No visual loss, blurred vision, double vision or yellow sclerae.No hearing loss, sneezing, congestion, runny nose or sore throat.  SKIN: No rash or itching.  CARDIOVASCULAR:  RESPIRATORY: No shortness of breath, cough or sputum.  GASTROINTESTINAL: No anorexia, nausea, vomiting or diarrhea. No abdominal pain or blood.  GENITOURINARY: No burning on urination, no polyuria NEUROLOGICAL: No headache, dizziness, syncope, paralysis, ataxia, numbness or tingling in the extremities. No change in bowel or bladder control.  MUSCULOSKELETAL: No muscle, back pain, joint pain or stiffness.  LYMPHATICS: No enlarged nodes. No history of splenectomy.  PSYCHIATRIC: No history of depression or anxiety.  ENDOCRINOLOGIC: No reports of sweating, cold or heat intolerance. No polyuria or polydipsia.  Marland Kitchen   Physical Examination There were no vitals filed for this visit. There were no vitals filed for this visit.  Gen: resting comfortably, no acute distress HEENT: no scleral icterus, pupils equal round and reactive, no palptable cervical adenopathy,    CV Resp: Clear to auscultation bilaterally GI: abdomen is soft, non-tender, non-distended, normal bowel sounds, no hepatosplenomegaly MSK: extremities are warm, no edema.  Skin: warm, no rash Neuro:  no focal deficits Psych: appropriate affect   Diagnostic Studies Jan 2016 Cath PCI Note:She was given an additional 5000 units IV heparin x 1. ACT was over 200. She was given Brilinta 180 mg po x 1.   Lesion # 1 Distal RCA: JR4 guiding catheter used to engage the RCA. Cougar IC wire down the RCA. 2.0 x 15 mm balloon x 1 for pre-dilatation. 2.25 x 16 mm Promus Premier DES x 1 distal RCA post-dilated with a 2.5 x 12 mm [place] balloon x 1. Stenosis taken from 90%  down to 0%.   Lesion # 2 mid RCA: 2.0 x 15 mm balloon x 1. 3.0 x 12 mm Promus Premier DES x 1. The mid stenosis was followed by an aneurysmal segment with a size mismatch before the distal vessel so stents were not overlapped. Post-dilatation with a 3.5 x 8 mm [place] balloon x 1. Stenosis taken from 95% down to 0%.   Lesion #3 mid LAD: XB LAD 3.5 guiding catheter to engage left main. 2.5 x 12 mm balloon x 1 for pre-dilatation. 3.5 x 16 mm Promus Premier DES x 1 mid LAD. Stent post-dilated with a 3.5 x 8 mm [place] balloon x 2. Stenosis taken from 80% down to 0%.  The sheath was removed from the right radial artery and a Terumo hemostasis band was applied at the arteriotomy site on the right wrist. There were no immediate complications. The patient was taken to the recovery area in stable condition.   Hemodynamic Findings: Central aortic pressure: 115/66 Left ventricular pressure: 121/3/14  Angiographic Findings:  Left main: No obstructive disease.   Left Anterior Descending Artery: Large caliber vessel that courses to the apex. 80% mid stenosis. Small caliber diagonal [place] Meun[place]. The distal LAD tapers to a small caliber vessel.   Circumflex Artery: Large caliber vessel with 20% proximal stenosis.   Right Coronary Artery: Large  dominant vessel with diffuse 30% proximal stenosis, 95% mid stenosis followed by an aneurysmal segment. Distal 90% stenosis. Diffuse mild plaque in the posterolateral artery and PDA.   Left Ventricular Angiogram: LVEF=55-60%.   Impression: 1. Severe double vessel CAD 2. NSTEMI 3. Normal LV systolic function 4. Successful PTCA/DES x 1 mid RCA and PTCA/DES x 1 distal RCA (stents not overlapped due to size mismatch in the vessel and the presence of an aneurysmal segment in the distal segment of the mid vessel) 5. Successful PTCA/DES x 1 mid LAD  Recommendations: Will need dual anti-platelet therapy with ASA and Brilinta for at least one year. Continue beta blocker and statin. Tobacco cessation.    04/2015 echo Study Conclusions  - Left ventricle: The cavity size was normal. Wall thickness was increased in a pattern of mild LVH. Systolic function was normal. The estimated ejection fraction was in the range of 55% to 60%. Wall motion was normal; there were no regional wall motion abnormalities. Left ventricular diastolic function parameters were normal. - Aortic valve: Mildly calcified annulus. Trileaflet; mildly thickened leaflets. Valve area (VTI): 2.07 cm^2. Valve area (Vmax): 2.2 cm^2. - Atrial septum: No defect or patent foramen ovale was identified. - Technically adequate study.  11/2015 DSE ------------------------------------------------------------------- Stress echo results: Left ventricular ejection fraction was normal at rest and with stress. Normal echo stress  05/2016 Nuclear stress [place] Severe gut uptake, limited study. No clear ischemia   11/2018 nuclear stress  There was no ST segment deviation noted during stress.  The study is normal. There are no perfusion defects consistent with prior infarct or current ischemia  This is a low risk study.  The left ventricular ejection fraction is normal (55-65%).  Assessment and Plan  1.  CAD - history of NSTEMI, s/p stenting to RCA and LAD - recent chest pain symptoms of unclear etiology. We will plan for a lexiscan to evaluate for underlying ishcemia  2. HTN -at goal, cotninue current meds  3. Hyperlipidemia -request labs from pcp, conitnue current meds      Antoine Poche, M.D., F.A.C.C.

## 2019-01-06 ENCOUNTER — Inpatient Hospital Stay (HOSPITAL_COMMUNITY): Admission: RE | Admit: 2019-01-06 | Payer: Medicare Other | Source: Ambulatory Visit

## 2019-01-12 ENCOUNTER — Encounter (HOSPITAL_COMMUNITY): Payer: Self-pay

## 2019-01-12 ENCOUNTER — Ambulatory Visit (HOSPITAL_COMMUNITY): Admit: 2019-01-12 | Payer: Medicare Other | Admitting: Neurosurgery

## 2019-01-12 SURGERY — INSERTION, SPINAL CORD STIMULATOR, LUMBAR
Anesthesia: General

## 2019-01-13 ENCOUNTER — Telehealth: Payer: Self-pay | Admitting: *Deleted

## 2019-01-13 NOTE — Telephone Encounter (Signed)
   Cardiac Questionnaire:    Since your last visit or hospitalization:    1. Have you been having new or worsening chest pain? No    2. Have you been having new or worsening shortness of breath? No 3. Have you been having new or worsening leg swelling, wt gain, or increase in abdominal girth (pants fitting more tightly)? No    4. Have you had any passing out spells? No     *A YES to any of these questions would result in the appointment being kept. *If all the answers to these questions are NO, we should indicate that given the current situation regarding the worldwide coronarvirus pandemic, at the recommendation of the CDC, we are looking to limit gatherings in our waiting area, and thus will reschedule their appointment beyond four weeks from today.   _____________      Will reschedule pt.

## 2019-01-18 ENCOUNTER — Ambulatory Visit: Payer: Medicare Other | Admitting: Student

## 2019-03-10 ENCOUNTER — Telehealth: Payer: Self-pay | Admitting: Student

## 2019-03-10 NOTE — Telephone Encounter (Signed)
Virtual Visit Pre-Appointment Phone Call  "(Name), I am calling you today to discuss your upcoming appointment. We are currently trying to limit exposure to the virus that causes COVID-19 by seeing patients at home rather than in the office."  1. "What is the BEST phone number to call the day of the visit?" - include this in appointment notes  2. Do you have or have access to (through a family member/friend) a smartphone with video capability that we can use for your visit?" a. If yes - list this number in appt notes as cell (if different from BEST phone #) and list the appointment type as a VIDEO visit in appointment notes b. If no - list the appointment type as a PHONE visit in appointment notes  3. Confirm consent - "In the setting of the current Covid19 crisis, you are scheduled for a (phone or video) visit with your provider on (date) at (time).  Just as we do with many in-office visits, in order for you to participate in this visit, we must obtain consent.  If you'd like, I can send this to your mychart (if signed up) or email for you to review.  Otherwise, I can obtain your verbal consent now.  All virtual visits are billed to your insurance company just like a normal visit would be.  By agreeing to a virtual visit, we'd like you to understand that the technology does not allow for your provider to perform an examination, and thus may limit your provider's ability to fully assess your condition. If your provider identifies any concerns that need to be evaluated in person, we will make arrangements to do so.  Finally, though the technology is pretty good, we cannot assure that it will always work on either your or our end, and in the setting of a video visit, we may have to convert it to a phone-only visit.  In either situation, we cannot ensure that we have a secure connection.  Are you willing to proceed?" STAFF: Did the patient verbally acknowledge consent to telehealth visit? Document  YES/NO here: Yes  4. Advise patient to be prepared - "Two hours prior to your appointment, go ahead and check your blood pressure, pulse, oxygen saturation, and your weight (if you have the equipment to check those) and write them all down. When your visit starts, your provider will ask you for this information. If you have an Apple Watch or Kardia device, please plan to have heart rate information ready on the day of your appointment. Please have a pen and paper handy nearby the day of the visit as well."  5. Give patient instructions for MyChart download to smartphone OR [email or web address] as below if video visit (depending on what platform provider is using)  6. Inform patient they will receive a phone call 15 minutes prior to their appointment time (may be from unknown caller ID) so they should be prepared to answer    TELEPHONE CALL NOTE  Tanya Liz MaladyM Pereda has been deemed a candidate for a follow-up tele-health visit to limit community exposure during the Covid-19 pandemic. I spoke with the patient via phone to ensure availability of phone/video source, confirm preferred email & phone number, and discuss instructions and expectations.  I reminded Tanya Lowe to be prepared with any vital sign and/or heart rhythm information that could potentially be obtained via home monitoring, at the time of her visit. I reminded Tanya Lowe to expect a phone call prior to  her visit.  Dyane Dustman 03/10/2019 12:18 PM

## 2019-03-18 ENCOUNTER — Encounter: Payer: Self-pay | Admitting: Student

## 2019-03-18 ENCOUNTER — Other Ambulatory Visit: Payer: Self-pay

## 2019-03-18 ENCOUNTER — Telehealth (INDEPENDENT_AMBULATORY_CARE_PROVIDER_SITE_OTHER): Payer: Medicare Other | Admitting: Student

## 2019-03-18 VITALS — HR 77 | Ht 62.5 in | Wt 180.0 lb

## 2019-03-18 DIAGNOSIS — G4733 Obstructive sleep apnea (adult) (pediatric): Secondary | ICD-10-CM

## 2019-03-18 DIAGNOSIS — I2510 Atherosclerotic heart disease of native coronary artery without angina pectoris: Secondary | ICD-10-CM | POA: Diagnosis not present

## 2019-03-18 DIAGNOSIS — E785 Hyperlipidemia, unspecified: Secondary | ICD-10-CM

## 2019-03-18 DIAGNOSIS — I10 Essential (primary) hypertension: Secondary | ICD-10-CM

## 2019-03-18 DIAGNOSIS — Z7189 Other specified counseling: Secondary | ICD-10-CM

## 2019-03-18 DIAGNOSIS — Z720 Tobacco use: Secondary | ICD-10-CM

## 2019-03-18 NOTE — Patient Instructions (Signed)
Medication Instructions:  Your physician recommends that you continue on your current medications as directed. Please refer to the Current Medication list given to you today.   Labwork: NONE   Testing/Procedures: NONE   Follow-Up: Your physician wants you to follow-up in: 1 Year with Dr. Branch.  You will receive a reminder letter in the mail two months in advance. If you don't receive a letter, please call our office to schedule the follow-up appointment.   Any Other Special Instructions Will Be Listed Below (If Applicable).     If you need a refill on your cardiac medications before your next appointment, please call your pharmacy. Thank you for choosing [facility]!    

## 2019-03-18 NOTE — Progress Notes (Signed)
Virtual Visit via Telephone Note   This visit type was conducted due to national recommendations for restrictions regarding the COVID-19 Pandemic (e.g. social distancing) in an effort to limit this patient's exposure and mitigate transmission in our community.  Due to her co-morbid illnesses, this patient is at least at moderate risk for complications without adequate follow up.  This format is felt to be most appropriate for this patient at this time.  The patient did not have access to video technology/had technical difficulties with video requiring transitioning to audio format only (telephone).  All issues noted in this document were discussed and addressed.  No physical exam could be performed with this format.  Please refer to the patient's chart for her  consent to telehealth for [facility].   Date:  03/18/2019   ID:  Tanya Lowe, DOB 1971-02-01, MRN [id number]  Patient Location: Home Provider Location: Home  PCP:  [facility]  Cardiologist:  Dina Rich, MD  Electrophysiologist:  None   Evaluation Performed:  Follow-Up Visit  Chief Complaint:  No recent symptoms  History of Present Illness:    Tanya Lowe is a 48 y.o. female with past medical history of CAD (s/p NSTEMI in 2016 with DES to mid-RCA, DES to distal-RCA, and DES to mid-LAD), HTN, HLD, OSA, and continued tobacco use who presents for a telehealth follow-up visit.   She was last examined by Dr. Wyline Mood in 10/2018 and reported episodes of chest pain which would last more minutes at a time and occur a few times per week which was not associated with exertion or food consumption. Given her recent symptoms, a repeat Lexiscan Myoview was recommended for ischemic evaluation. This was performed in 11/2018 and showed no significant ischemia, overall being a low-risk study. Her follow-up visit in 12/2018 was rescheduled due to COVID-19.   In talking with the patient today, she reports overall  doing well from a cardiac perspective since her last office visit. She denies any recurrent episodes of chest pain. Reports that her brother had passed away unexpectedly around the time of her symptoms and she feels like the anxiety and stress of this might have been playing a role. She denies any recent dyspnea on exertion, orthopnea, PND, lower extremity edema, or palpitations.  She does not have a blood pressure cuff at home but denies any recent changes in her medication regimen. She does continue to smoke approximately 1 pack/day.  The patient does not have symptoms concerning for COVID-19 infection (fever, chills, cough, or new shortness of breath).    Past Medical History:  Diagnosis Date  . Arthritis   . Bilateral hand pain   . Cervical radiculopathy   . Chronic neck and back pain   . Fibromyalgia   . Hypertension   . Kidney stones    hx of w previous lithotripsy  . MI (myocardial infarction) (HCC)   . Pain management    [facility]  . Thoracic back pain    Past Surgical History:  Procedure Laterality Date  . CARDIAC SURGERY    . CYSTOSCOPY WITH RETROGRADE PYELOGRAM, URETEROSCOPY AND STENT PLACEMENT Left 03/14/2015   Procedure: CYSTOSCOPY WITH LEFT RETROGRADE PYELOGRAM;  Surgeon: Alfredo Martinez, MD;  Location: [facility];  Service: Urology;  Laterality: Left;  . CYSTOSCOPY WITH RETROGRADE PYELOGRAM, URETEROSCOPY AND STENT PLACEMENT Left 03/20/2015   Procedure: CYSTOSCOPY WITH LEFT  RETROGRADE LEFT URETEROSCOPY AND LEFT  STENT ;  Surgeon: Bjorn Pippin, MD;  Location: [facility];  Service: Urology;  Laterality: Left;  . HOLMIUM LASER APPLICATION Left 03/20/2015   Procedure: HOLMIUM LASER APPLICATION;  Surgeon: Bjorn PippinJohn Wrenn, MD;  Location: [facility];  Service: Urology;  Laterality: Left;  . LEFT HEART CATHETERIZATION WITH CORONARY ANGIOGRAM N/A 10/25/2014   Procedure: LEFT HEART CATHETERIZATION WITH CORONARY ANGIOGRAM;  Surgeon: Kathleene Hazelhristopher D McAlhany, MD;  [facility];  Service: Cardiovascular;   Laterality: N/A;  . LITHOTRIPSY  about 2011  . SPINAL FUSION     L5-S1  . TUBAL LIGATION       Current Meds  Medication Sig  . acetaminophen (TYLENOL) 500 MG tablet Take 1,000 mg by mouth every 6 (six) hours as needed for mild pain.   Marland Kitchen. albuterol (PROVENTIL HFA;VENTOLIN HFA) 108 (90 Base) MCG/ACT inhaler Inhale 1-2 puffs into the lungs every 6 (six) hours as needed for wheezing or shortness of breath.  Marland Kitchen. aspirin EC 81 MG tablet Take 81 mg by mouth daily.  Marland Kitchen. atorvastatin (LIPITOR) 80 MG tablet Take 1 tablet (80 mg total) by mouth daily at 6 PM.  . busPIRone (BUSPAR) 7.5 MG tablet Take 7.5 mg by mouth 2 (two) times daily.  . DULoxetine (CYMBALTA) 60 MG capsule Take 60 mg by mouth daily.  . fluticasone (FLONASE) 50 MCG/ACT nasal spray Place 2 sprays into both nostrils daily as needed for allergies.   Marland Kitchen. gabapentin (NEURONTIN) 600 MG tablet Take 1,200 mg by mouth 3 (three) times daily.  Marland Kitchen. lisinopril (PRINIVIL,ZESTRIL) 2.5 MG tablet Take 1 tablet (2.5 mg total) by mouth daily.  . metoprolol tartrate (LOPRESSOR) 25 MG tablet Take 1 tablet (25 mg total) by mouth 2 (two) times daily.  . nitroGLYCERIN (NITROSTAT) 0.4 MG SL tablet Place 1 tablet (0.4 mg total) under the tongue every 5 (five) minutes as needed for chest pain.  Marland Kitchen. omeprazole (PRILOSEC) 20 MG capsule Take 20 mg by mouth daily.  Marland Kitchen. oxyCODONE-acetaminophen (PERCOCET) 10-325 MG per tablet Take 1 tablet by mouth 2 (two) times daily.   Marland Kitchen. tiZANidine (ZANAFLEX) 4 MG tablet Take 4 mg by mouth 3 (three) times daily.      Allergies:   Lyrica [pregabalin]; Bactrim [sulfamethoxazole-trimethoprim]; and Prednisone   Social History   Tobacco Use  . Smoking status: Current Every Day Smoker    Packs/day: 0.75    Years: 24.00    Pack years: 18.00    Types: Cigarettes    Start date: 10/25/1990  . Smokeless tobacco: Never Used  Substance Use Topics  . Alcohol use: No    Alcohol/week: 0.0 standard drinks  . Drug use: No     Family Hx: The  patient's family history includes Cancer in an other family member; Diabetes in her mother and another family member; Seizures in an other family member; Stroke in her mother.  ROS:   Please see the history of present illness.     All other systems reviewed and are negative.   Prior CV studies:   The following studies were reviewed today:  Echocardiogram: 04/2015 Study Conclusions  - Left ventricle: The cavity size was normal. Wall thickness was   increased in a pattern of mild LVH. Systolic function was normal.   The estimated ejection fraction was in the range of 55% to 60%.   Wall motion was normal; there were no regional wall motion   abnormalities. Left ventricular diastolic function parameters   were normal. - Aortic valve: Mildly calcified annulus. Trileaflet; mildly   thickened leaflets. Valve area (VTI): 2.07 cm^2. Valve area   (  Vmax): 2.2 cm^2. - Atrial septum: No defect or patent foramen ovale was identified. - Technically adequate study.  NST: 11/2018  There was no ST segment deviation noted during stress.  The study is normal. There are no perfusion defects consistent with prior infarct or current ischemia  This is a low risk study.  The left ventricular ejection fraction is normal (55-65%).  Labs/Other Tests and Data Reviewed:    EKG:  No ECG reviewed.  Recent Labs: No results found for requested labs within last 8760 hours.   Recent Lipid Panel Lab Results  Component Value Date/Time   CHOL 121 10/25/2014 12:04 AM   TRIG 251 (H) 10/25/2014 12:04 AM   HDL 21 (L) 10/25/2014 12:04 AM   CHOLHDL 5.8 10/25/2014 12:04 AM   LDLCALC 50 10/25/2014 12:04 AM    Wt Readings from Last 3 Encounters:  03/18/19 180 lb (81.6 kg)  11/18/18 188 lb (85.3 kg)  09/14/18 193 lb (87.5 kg)     Objective:    Vital Signs:  Pulse 77   Ht 5' 2.5" (1.588 m)   Wt 180 lb (81.6 kg)   BMI 32.40 kg/m    General: Pleasant female sounding in NAD Psych: Normal affect.  Neuro: Alert and oriented X 3. Lungs:  Resp regular and unlabored while talking on the phone.    ASSESSMENT & PLAN:    1. CAD - she is s/p NSTEMI in 2016 with DES to mid-RCA, DES to distal-RCA, and DES to mid-LAD. Was recently experiencing chest pain in the setting of having an unexpected death in her family with NST showing no significant ischemia, overall being a low-risk study. She denies any recurrent symptoms since. No indication for further ischemic testing at this time.  - continue ASA, statin, and BB therapy.   2. HTN - she does not have a BP cuff at home but BP has been well-controlled at her most recent office visits.  - continue Lisinopril 2.5mg  daily and Lopressor  BID.   3. HLD - followed by PCP. LDL at 45 on most recent check which is at goal. Continue Atorvastatin  daily.   4. OSA - she reports good compliance with her CPAP.   5. Tobacco Use - she continues to smoke 1 ppd. Cessation advised.   6. COVID-19 Education - The signs and symptoms of COVID-19 were discussed with the patient. The importance of social distancing was discussed today.  Time:   Today, I have spent 17 minutes with the patient with telehealth technology discussing the above problems.     Medication Adjustments/Labs and Tests Ordered: Current medicines are reviewed at length with the patient today.  Concerns regarding medicines are outlined above.   Tests Ordered: No orders of the defined types were placed in this encounter.   Medication Changes: No orders of the defined types were placed in this encounter.   Disposition:  Keep Annual Follow-up with Dr. Wyline Mood  Signed, Ellsworth Lennox, PA-C  03/18/2019 2:41 PM    [facility]

## 2019-03-21 ENCOUNTER — Telehealth: Payer: Self-pay | Admitting: Neurology

## 2019-03-21 DIAGNOSIS — G4733 Obstructive sleep apnea (adult) (pediatric): Secondary | ICD-10-CM

## 2019-03-21 NOTE — Telephone Encounter (Signed)
Order for cpap supplies placed, faxed to [facility]. Received a receipt of confirmation.

## 2019-03-21 NOTE — Telephone Encounter (Signed)
Pt called stating that [facility] is needing a new RX for her CPAP supplies. Phone number to [facility] in [place] is [phone]. Please advise.

## 2019-08-11 ENCOUNTER — Other Ambulatory Visit: Payer: Self-pay | Admitting: Family Medicine

## 2019-08-11 DIAGNOSIS — N644 Mastodynia: Secondary | ICD-10-CM

## 2019-09-06 ENCOUNTER — Ambulatory Visit
Admission: RE | Admit: 2019-09-06 | Discharge: 2019-09-06 | Disposition: A | Discharge status: Home / Self Care | Payer: Medicare Other | Source: Ambulatory Visit | Attending: Family Medicine | Admitting: Family Medicine

## 2019-09-06 ENCOUNTER — Ambulatory Visit: Payer: Medicare Other

## 2019-09-06 ENCOUNTER — Other Ambulatory Visit: Payer: Self-pay

## 2019-09-06 DIAGNOSIS — N644 Mastodynia: Secondary | ICD-10-CM

## 2019-09-08 ENCOUNTER — Encounter: Payer: Self-pay | Admitting: Internal Medicine

## 2019-09-20 ENCOUNTER — Encounter: Payer: Self-pay | Admitting: Adult Health

## 2019-09-22 ENCOUNTER — Other Ambulatory Visit: Payer: Self-pay

## 2019-09-22 ENCOUNTER — Encounter: Payer: Self-pay | Admitting: Adult Health

## 2019-09-22 ENCOUNTER — Ambulatory Visit (INDEPENDENT_AMBULATORY_CARE_PROVIDER_SITE_OTHER): Payer: Medicare Other | Admitting: Adult Health

## 2019-09-22 ENCOUNTER — Telehealth: Payer: Self-pay

## 2019-09-22 VITALS — BP 109/74 | HR 72 | Temp 97.4°F | Ht 62.5 in | Wt 176.4 lb

## 2019-09-22 DIAGNOSIS — I2510 Atherosclerotic heart disease of native coronary artery without angina pectoris: Secondary | ICD-10-CM

## 2019-09-22 DIAGNOSIS — Z9989 Dependence on other enabling machines and devices: Secondary | ICD-10-CM | POA: Diagnosis not present

## 2019-09-22 DIAGNOSIS — G4733 Obstructive sleep apnea (adult) (pediatric): Secondary | ICD-10-CM
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
## 2019-09-22 NOTE — Telephone Encounter (Signed)
CPAP orders have been faxed to the DME company. Confirmation fax has been received.    DME: [place] P: [phone] F: [phone]

## 2019-09-22 NOTE — Patient Instructions (Signed)

## 2019-09-23 ENCOUNTER — Ambulatory Visit: Payer: Medicare Other | Admitting: Gastroenterology

## 2019-10-20 ENCOUNTER — Ambulatory Visit: Payer: Medicare Other | Admitting: Gastroenterology

## 2020-03-13 ENCOUNTER — Encounter: Payer: Self-pay | Admitting: Cardiology

## 2020-03-13 ENCOUNTER — Encounter: Payer: Self-pay | Admitting: *Deleted

## 2020-03-13 ENCOUNTER — Telehealth (INDEPENDENT_AMBULATORY_CARE_PROVIDER_SITE_OTHER): Payer: Medicare Other | Admitting: Cardiology

## 2020-03-13 VITALS — Ht 62.5 in | Wt 176.0 lb

## 2020-03-13 DIAGNOSIS — I10 Essential (primary) hypertension: Secondary | ICD-10-CM

## 2020-03-13 DIAGNOSIS — I2510 Atherosclerotic heart disease of native coronary artery without angina pectoris: Secondary | ICD-10-CM

## 2020-03-13 DIAGNOSIS — E782 Mixed hyperlipidemia: Secondary | ICD-10-CM | POA: Diagnosis not present

## 2020-03-13 NOTE — Patient Instructions (Signed)

## 2020-03-13 NOTE — Progress Notes (Signed)
Virtual Visit via Telephone Note   This visit type was conducted due to national recommendations for restrictions regarding the COVID-19 Pandemic (e.g. social distancing) in an effort to limit this patient's exposure and mitigate transmission in our community.  Due to her co-morbid illnesses, this patient is at least at moderate risk for complications without adequate follow up.  This format is felt to be most appropriate for this patient at this time.  The patient did not have access to video technology/had technical difficulties with video requiring transitioning to audio format only (telephone).  All issues noted in this document were discussed and addressed.  No physical exam could be performed with this format.  Please refer to the patient's chart for her  consent to telehealth for [facility].   The patient was identified using 2 identifiers.  Date:  03/13/2020   ID:  Tanya Lowe, DOB 1971-05-31, MRN [id number]  Patient Location: Home Provider Location: Office  PCP:  Tanna Furry, MD  Cardiologist:  Dina Rich, MD  Electrophysiologist:  None   Evaluation Performed:  Follow-Up Visit  Chief Complaint:  Follow up  History of Present Illness:    Tanya Lowe is a 49 y.o. female seen today for follow up of the following medical problems.   1. CAD - history of NSTEMI Jan 2016, s/p DES to mid RCA and DES to distal RCA and DES to mid LAD.  - 04/2015 echo LVEF 55-60% -11/2015 DSE without ischemia - 05/2016 nuclear stress [place] no clear ischemia, limited by signicant gut radiotrace uptake  11/2018 nuclear stress: no ischemia  - can be sharp/nagging pain chest pain, mid to right sided. Most often with rest, no other associated symptoms. Lasts a few minutes. Occurs few times a week. Numbness bother arms R>L. Not positional.  - has not tried nitro. Can be better with additional with ASA. - mild SOB with activities. Not related to eating.   - cannot run on  treadmill due to back pain.    - some chest pain few weeks ago she associates with her mother passing away.  - compliant with meds  2. HTN -she is compliant with meds  3. Hyperlipidemia 07/2017 TC 103 TG 106 HDL 39 LDL 45 -- she is compliant with statin  4. OSA - followed by Dr Frances Furbish neurology - she remains compliant with CPAP  5. Chronic back pain - prior nerve stimulator surgery   6. DM2 - start metformin 500mg  bid.      SH: has not had covid vaccine, she is resistant    The patient does not have symptoms concerning for COVID-19 infection (fever, chills, cough, or new shortness of breath).    Past Medical History:  Diagnosis Date  . Arthritis   . Bilateral hand pain   . Cervical radiculopathy   . Chronic neck and back pain   . Fibromyalgia   . Hypertension   . Kidney stones    hx of w previous lithotripsy  . MI (myocardial infarction) (HCC)   . Pain management    [facility]  . Thoracic back pain    Past Surgical History:  Procedure Laterality Date  . CARDIAC SURGERY    . CYSTOSCOPY WITH RETROGRADE PYELOGRAM, URETEROSCOPY AND STENT PLACEMENT Left 03/14/2015   Procedure: CYSTOSCOPY WITH LEFT RETROGRADE PYELOGRAM;  Surgeon: 03/16/2015, MD;  Location: [facility];  Service: Urology;  Laterality: Left;  . CYSTOSCOPY WITH RETROGRADE PYELOGRAM, URETEROSCOPY AND STENT PLACEMENT Left 03/20/2015   Procedure: CYSTOSCOPY  WITH LEFT  RETROGRADE LEFT URETEROSCOPY AND LEFT  STENT ;  Surgeon: Bjorn Pippin, MD;  Location: [facility];  Service: Urology;  Laterality: Left;  . HOLMIUM LASER APPLICATION Left 03/20/2015   Procedure: HOLMIUM LASER APPLICATION;  Surgeon: Bjorn Pippin, MD;  Location: [facility];  Service: Urology;  Laterality: Left;  . LEFT HEART CATHETERIZATION WITH CORONARY ANGIOGRAM N/A 10/25/2014   Procedure: LEFT HEART CATHETERIZATION WITH CORONARY ANGIOGRAM;  Surgeon: Kathleene Hazel, MD;  Location: [facility] CATH LAB;  Service: Cardiovascular;  Laterality: N/A;  .  LITHOTRIPSY  about 2011  . SPINAL FUSION     L5-S1  . TUBAL LIGATION       Current Meds  Medication Sig  . acetaminophen (TYLENOL) 500 MG tablet Take 1,000 mg by mouth every 6 (six) hours as needed for mild pain.   Marland Kitchen albuterol (PROVENTIL HFA;VENTOLIN HFA) 108 (90 Base) MCG/ACT inhaler Inhale 1-2 puffs into the lungs every 6 (six) hours as needed for wheezing or shortness of breath.  Marland Kitchen aspirin EC 81 MG tablet Take 81 mg by mouth daily.  Marland Kitchen atorvastatin (LIPITOR) 80 MG tablet Take 1 tablet (80 mg total) by mouth daily at 6 PM.  . busPIRone (BUSPAR) 7.5 MG tablet Take 7.5 mg by mouth 2 (two) times daily.  . Cetirizine HCl (ZYRTEC PO) Take by mouth.  . DULoxetine (CYMBALTA) 60 MG capsule Take 60 mg by mouth daily.  . fluticasone (FLONASE) 50 MCG/ACT nasal spray Place 2 sprays into both nostrils daily as needed for allergies.   Marland Kitchen gabapentin (NEURONTIN) 600 MG tablet Take 1,200 mg by mouth 3 (three) times daily.  Marland Kitchen lisinopril (PRINIVIL,ZESTRIL) 2.5 MG tablet Take 1 tablet (2.5 mg total) by mouth daily.  . metoprolol tartrate (LOPRESSOR) 25 MG tablet Take 1 tablet (25 mg total) by mouth 2 (two) times daily.  . nitroGLYCERIN (NITROSTAT) 0.4 MG SL tablet Place 1 tablet (0.4 mg total) under the tongue every 5 (five) minutes as needed for chest pain.  Marland Kitchen omeprazole (PRILOSEC) 20 MG capsule Take 20 mg by mouth daily.  Marland Kitchen oxyCODONE-acetaminophen (PERCOCET) 10-325 MG per tablet Take 1 tablet by mouth 2 (two) times daily.   Marland Kitchen tiZANidine (ZANAFLEX) 4 MG tablet Take 4 mg by mouth 3 (three) times daily.      Allergies:   Lyrica [pregabalin], Bactrim [sulfamethoxazole-trimethoprim], and Prednisone   Social History   Tobacco Use  . Smoking status: Current Every Day Smoker    Packs/day: 0.75    Years: 24.00    Pack years: 18.00    Types: Cigarettes    Start date: 10/25/1990  . Smokeless tobacco: Never Used  Substance Use Topics  . Alcohol use: No    Alcohol/week: 0.0 standard drinks  . Drug use: No       Family Hx: The patient's family history includes Cancer in an other family member; Diabetes in her mother and another family member; Seizures in an other family member; Stroke in her mother.  ROS:   Please see the history of present illness.     All other systems reviewed and are negative.   Prior CV studies:   The following studies were reviewed today:  Jan 2016 Cath PCI Note:She was given an additional 5000 units IV heparin x 1. ACT was over 200. She was given Brilinta 180 mg po x 1.   Lesion # 1 Distal RCA: JR4 guiding catheter used to engage the RCA. Cougar IC wire down the RCA. 2.0 x 15 mm balloon x 1  for pre-dilatation. 2.25 x 16 mm Promus Premier DES x 1 distal RCA post-dilated with a 2.5 x 12 mm [place] balloon x 1. Stenosis taken from 90% down to 0%.   Lesion # 2 mid RCA: 2.0 x 15 mm balloon x 1. 3.0 x 12 mm Promus Premier DES x 1. The mid stenosis was followed by an aneurysmal segment with a size mismatch before the distal vessel so stents were not overlapped. Post-dilatation with a 3.5 x 8 mm [place] balloon x 1. Stenosis taken from 95% down to 0%.   Lesion #3 mid LAD: XB LAD 3.5 guiding catheter to engage left main. 2.5 x 12 mm balloon x 1 for pre-dilatation. 3.5 x 16 mm Promus Premier DES x 1 mid LAD. Stent post-dilated with a 3.5 x 8 mm [place] balloon x 2. Stenosis taken from 80% down to 0%.  The sheath was removed from the right radial artery and a Terumo hemostasis band was applied at the arteriotomy site on the right wrist. There were no immediate complications. The patient was taken to the recovery area in stable condition.   Hemodynamic Findings: Central aortic pressure: 115/66 Left ventricular pressure: 121/3/14  Angiographic Findings:  Left main: No obstructive disease.   Left Anterior Descending Artery: Large caliber vessel that courses to the apex. 80% mid stenosis. Small caliber diagonal [place]. The distal LAD tapers to a small caliber vessel.   Circumflex  Artery: Large caliber vessel with 20% proximal stenosis.   Right Coronary Artery: Large dominant vessel with diffuse 30% proximal stenosis, 95% mid stenosis followed by an aneurysmal segment. Distal 90% stenosis. Diffuse mild plaque in the posterolateral artery and PDA.   Left Ventricular Angiogram: LVEF=55-60%.   Impression: 1. Severe double vessel CAD 2. NSTEMI 3. Normal LV systolic function 4. Successful PTCA/DES x 1 mid RCA and PTCA/DES x 1 distal RCA (stents not overlapped due to size mismatch in the vessel and the presence of an aneurysmal segment in the distal segment of the mid vessel) 5. Successful PTCA/DES x 1 mid LAD  Recommendations: Will need dual anti-platelet therapy with ASA and Brilinta for at least one year. Continue beta blocker and statin. Tobacco cessation.    04/2015 echo Study Conclusions  - Left ventricle: The cavity size was normal. Wall thickness was increased in a pattern of mild LVH. Systolic function was normal. The estimated ejection fraction was in the range of 55% to 60%. Wall motion was normal; there were no regional wall motion abnormalities. Left ventricular diastolic function parameters were normal. - Aortic valve: Mildly calcified annulus. Trileaflet; mildly thickened leaflets. Valve area (VTI): 2.07 cm^2. Valve area (Vmax): 2.2 cm^2. - Atrial septum: No defect or patent foramen ovale was identified. - Technically adequate study.  11/2015 DSE ------------------------------------------------------------------- Stress echo results: Left ventricular ejection fraction was normal at rest and with stress. Normal echo stress  05/2016 Nuclear stress [place] Severe gut uptake, limited study. No clear ischemia    11/2018 nuclear stress   There was no ST segment deviation noted during stress.  The study is normal. There are no perfusion defects consistent with prior infarct or current ischemia  This is a low risk  study.  The left ventricular ejection fraction is normal (55-65%). Labs/Other Tests and Data Reviewed:    EKG:  No ECG reviewed.  Recent Labs: No results found for requested labs within last 8760 hours.   Recent Lipid Panel Lab Results  Component Value Date/Time   CHOL 121 10/25/2014 12:04 AM  TRIG 251 (H) 10/25/2014 12:04 AM   HDL 21 (L) 10/25/2014 12:04 AM   CHOLHDL 5.8 10/25/2014 12:04 AM   LDLCALC 50 10/25/2014 12:04 AM    Wt Readings from Last 3 Encounters:  03/13/20 176 lb (79.8 kg)  09/22/19 176 lb 6.4 oz (80 kg)  03/18/19 180 lb (81.6 kg)     Objective:    Vital Signs:  Ht 5' 2.5" (1.588 m)   Wt 176 lb (79.8 kg)   BMI 31.68 kg/m    Normal affect, normal speech pattern and tone. Comforatble, no apparent distress. No audible signs of sob or wheezing.   ASSESSMENT & PLAN:    1. CAD -no recent cardiac symptoms. Stress test last year was benign - continue current meds  2. HTN -continue current meds  3. Hyperlipidemia -continue statin, request labs from pcp  COVID-19 Education: The signs and symptoms of COVID-19 were discussed with the patient and how to seek care for testing (follow up with PCP or arrange E-visit).  The importance of social distancing was discussed today.  Time:   Today, I have spent 16 minutes with the patient with telehealth technology discussing the above problems.     Medication Adjustments/Labs and Tests Ordered: Current medicines are reviewed at length with the patient today.  Concerns regarding medicines are outlined above.   Tests Ordered: No orders of the defined types were placed in this encounter.   Medication Changes: No orders of the defined types were placed in this encounter.   Follow Up:  In Person in 6 month(s)  Signed, Carlyle Dolly, MD  03/13/2020 8:12 AM    [place]

## 2020-03-22 ENCOUNTER — Ambulatory Visit: Payer: Medicare Other | Admitting: Adult Health

## 2020-04-17 ENCOUNTER — Encounter: Payer: Self-pay | Admitting: Internal Medicine
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
## 2020-04-24 ENCOUNTER — Encounter: Payer: Self-pay | Admitting: Adult Health

## 2020-04-24 ENCOUNTER — Ambulatory Visit: Payer: Medicare Other | Admitting: Adult Health

## 2020-04-30 ENCOUNTER — Telehealth: Payer: Self-pay

## 2020-04-30 NOTE — Telephone Encounter (Signed)
Pt left a VM asking to r/s her appt. Please call.

## 2020-04-30 NOTE — Telephone Encounter (Signed)
Attempted to call pt back , to schedule OV  Pt NS last appt, wants to reschedule

## 2020-05-01 NOTE — Telephone Encounter (Signed)
LM on the VM for the pt to call back and reschedule her NS appt

## 2020-05-03 ENCOUNTER — Encounter: Payer: Self-pay | Admitting: Adult Health

## 2020-05-03 ENCOUNTER — Other Ambulatory Visit: Payer: Self-pay

## 2020-05-03 ENCOUNTER — Ambulatory Visit (INDEPENDENT_AMBULATORY_CARE_PROVIDER_SITE_OTHER): Payer: Medicare Other | Admitting: Adult Health

## 2020-05-03 VITALS — BP 107/70 | HR 85 | Ht 62.5 in | Wt 180.0 lb

## 2020-05-03 DIAGNOSIS — I2510 Atherosclerotic heart disease of native coronary artery without angina pectoris: Secondary | ICD-10-CM | POA: Diagnosis not present

## 2020-05-03 DIAGNOSIS — Z9989 Dependence on other enabling machines and devices: Secondary | ICD-10-CM | POA: Diagnosis not present

## 2020-05-03 DIAGNOSIS — G4733 Obstructive sleep apnea (adult) (pediatric): Secondary | ICD-10-CM | POA: Diagnosis not present

## 2020-05-03 NOTE — Patient Instructions (Signed)
Continue using CPAP nightly and greater than 4 hours each night °If your symptoms worsen or you develop new symptoms please let us know.  ° °
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
## 2020-06-21 ENCOUNTER — Ambulatory Visit: Payer: Medicare Other | Admitting: Nurse Practitioner

## 2020-07-26 ENCOUNTER — Ambulatory Visit: Payer: Medicare Other | Admitting: Nurse Practitioner

## 2020-11-12 ENCOUNTER — Telehealth: Payer: Self-pay | Admitting: Cardiology

## 2020-11-12 NOTE — Telephone Encounter (Signed)
New message    Pt c/o of Chest Pain: STAT if CP now or developed within 24 hours  1. Are you having CP right now? No   2. Are you experiencing any other symptoms (ex. SOB, nausea, vomiting, sweating)? Pain down right arm   3. How long have you been experiencing CP? Off and on since having covid   4. Is your CP continuous or coming and going? Comes and goes  5. Have you taken Nitroglycerin? yes ?

## 2020-11-12 NOTE — Telephone Encounter (Signed)
Difficult to distinguish COVID related inflammation and pain vs cardiac chest pain, can she get a PA virtual visit this week. If significant recurent pains needs ER eval. Since virtual could actually be with any PA in the system.   Dominga Ferry MD

## 2020-11-12 NOTE — Progress Notes (Signed)
Cardiology Office Note    Date:  11/14/2020   ID:  Tanya Lowe, DOB 12-15-1970, MRN [id number]  PCP:  Tanna Furry, MD  Cardiologist: Dina Rich, MD EPS: None  Chief Complaint  Patient presents with  . Follow-up    History of Present Illness:  Tanya Lowe is a 50 y.o. female with history ofCAD status post NSTEMI 10/2014 treated with DES to the mid RCA and DES to the distal RCA and DES to the mid LAD, NST 2020 no ischemia, history of sharp nagging chest pain at rest, Hypertension, Hyperlipidemia, OSA on CPAP,DM type II.  Patient last saw Dr. Wyline Mood 03/13/2020 at which time she was having sharp nagging chest pain at rest felt to be atypical no changes made.  Patient added onto my schedule for chest pain after testing positive for Covid19. Patient says she had all the symptoms of covid 4 and daughter and grandson were positive. She only tested 8 days later and tested negative. Complains of a constant dull stabbing pain in her chest into her back. Sunday her right arm went numb and hurting in her chest ear and face. Took 1 NTG and 2 ASA with relief. Was watching TV. No shortness of breath. Doesn't have a cough or congestion. Under immense stress at home and thinks that may have contributed to it.  No active-disabled from back problems. Smokes 1 ppd. Not ready to quit.      Past Medical History:  Diagnosis Date  . Arthritis   . Bilateral hand pain   . Cervical radiculopathy   . Chronic neck and back pain   . Fibromyalgia   . Hypertension   . Kidney stones    hx of w previous lithotripsy  . MI (myocardial infarction) (HCC)   . Pain management    [facility]  . Thoracic back pain     Past Surgical History:  Procedure Laterality Date  . CARDIAC SURGERY    . CYSTOSCOPY WITH RETROGRADE PYELOGRAM, URETEROSCOPY AND STENT PLACEMENT Left 03/14/2015   Procedure: CYSTOSCOPY WITH LEFT RETROGRADE PYELOGRAM;  Surgeon: Alfredo Martinez, MD;  Location: [facility];  Service: Urology;   Laterality: Left;  . CYSTOSCOPY WITH RETROGRADE PYELOGRAM, URETEROSCOPY AND STENT PLACEMENT Left 03/20/2015   Procedure: CYSTOSCOPY WITH LEFT  RETROGRADE LEFT URETEROSCOPY AND LEFT  STENT ;  Surgeon: Bjorn Pippin, MD;  Location: [facility];  Service: Urology;  Laterality: Left;  . HOLMIUM LASER APPLICATION Left 03/20/2015   Procedure: HOLMIUM LASER APPLICATION;  Surgeon: Bjorn Pippin, MD;  Location: [facility];  Service: Urology;  Laterality: Left;  . LEFT HEART CATHETERIZATION WITH CORONARY ANGIOGRAM N/A 10/25/2014   Procedure: LEFT HEART CATHETERIZATION WITH CORONARY ANGIOGRAM;  Surgeon: Kathleene Hazel, MD;  Location: [facility] CATH LAB;  Service: Cardiovascular;  Laterality: N/A;  . LITHOTRIPSY  about 2011  . SPINAL FUSION     L5-S1  . TUBAL LIGATION      Current Medications: Current Meds  Medication Sig  . acetaminophen (TYLENOL) 500 MG tablet Take 1,000 mg by mouth every 6 (six) hours as needed for mild pain.   Marland Kitchen albuterol (PROVENTIL HFA;VENTOLIN HFA) 108 (90 Base) MCG/ACT inhaler Inhale 1-2 puffs into the lungs every 6 (six) hours as needed for wheezing or shortness of breath.  Marland Kitchen aspirin EC 81 MG tablet Take 81 mg by mouth daily.  Marland Kitchen atorvastatin (LIPITOR) 80 MG tablet Take 1 tablet (80 mg total) by mouth daily at 6 PM.  . busPIRone (BUSPAR) 7.5 MG tablet Take  7.5 mg by mouth 2 (two) times daily.  . Cetirizine HCl (ZYRTEC PO) Take by mouth.  . DULoxetine (CYMBALTA) 60 MG capsule Take 60 mg by mouth daily.  . fluticasone (FLONASE) 50 MCG/ACT nasal spray Place 2 sprays into both nostrils daily as needed for allergies.   Marland Kitchen. gabapentin (NEURONTIN) 600 MG tablet Take 1,200 mg by mouth 3 (three) times daily.  Marland Kitchen. lisinopril (PRINIVIL,ZESTRIL) 2.5 MG tablet Take 1 tablet (2.5 mg total) by mouth daily.  . metFORMIN (GLUCOPHAGE) 500 MG tablet Take 500 mg by mouth 2 (two) times daily.  . metoprolol tartrate (LOPRESSOR) 25 MG tablet Take 1 tablet (25 mg total) by mouth 2 (two) times daily.  . naproxen (NAPROSYN)  500 MG tablet Take 500 mg by mouth 2 (two) times daily.  . nitroGLYCERIN (NITROSTAT) 0.4 MG SL tablet Place 1 tablet (0.4 mg total) under the tongue every 5 (five) minutes as needed for chest pain.  Marland Kitchen. omeprazole (PRILOSEC) 20 MG capsule Take 20 mg by mouth daily.  Marland Kitchen. oxyCODONE-acetaminophen (PERCOCET) 10-325 MG per tablet Take 1 tablet by mouth 2 (two) times daily.  Marland Kitchen. tiZANidine (ZANAFLEX) 4 MG tablet Take 4 mg by mouth 3 (three) times daily.     Allergies:   Lyrica [pregabalin], Bactrim [sulfamethoxazole-trimethoprim], and Prednisone   Social History   Socioeconomic History  . Marital status: Divorced    Spouse name: Not on file  . Number of children: Not on file  . Years of education: Not on file  . Highest education level: Not on file  Occupational History  . Not on file  Tobacco Use  . Smoking status: Current Every Day Smoker    Packs/day: 0.75    Years: 24.00    Pack years: 18.00    Types: Cigarettes    Start date: 10/25/1990  . Smokeless tobacco: Never Used  Vaping Use  . Vaping Use: Never used  Substance and Sexual Activity  . Alcohol use: No    Alcohol/week: 0.0 standard drinks  . Drug use: No  . Sexual activity: Not on file  Other Topics Concern  . Not on file  Social History Narrative  . Not on file   Social Determinants of Health   Financial Resource Strain: Not on file  Food Insecurity: Not on file  Transportation Needs: Not on file  Physical Activity: Not on file  Stress: Not on file  Social Connections: Not on file     Family History:  The patient's family history includes Cancer in an other family member; Diabetes in her mother and another family member; Seizures in an other family member; Stroke in her mother.   ROS:   Please see the history of present illness.    ROS All other systems reviewed and are negative.   PHYSICAL EXAM:   VS:  BP (!) 142/84   Pulse 99   Ht 5\' 2"  (1.575 m)   Wt 182 lb (82.6 kg)   SpO2 95%   BMI 33.29 kg/m   Physical  Exam  GEN: Obesity, in no acute distress  Neck: no JVD, carotid bruits, or masses Cardiac:RRR; no murmurs, rubs, or gallops  Respiratory:  clear to auscultation bilaterally, normal work of breathing GI: soft, nontender, nondistended, + BS Ext: without cyanosis, clubbing, or edema, Good distal pulses bilaterally Neuro:  Alert and Oriented x 3 Psych: euthymic mood, full affect  Wt Readings from Last 3 Encounters:  11/14/20 182 lb (82.6 kg)  05/03/20 180 lb (81.6 kg)  03/13/20 176  lb (79.8 kg)      Studies/Labs Reviewed:   EKG:  EKG is ordered today.  The ekg ordered today demonstrates NSR with nonspecific ST changes  Recent Labs: No results found for requested labs within last 8760 hours.   Lipid Panel    Component Value Date/Time   CHOL 121 10/25/2014 0004   TRIG 251 (H) 10/25/2014 0004   HDL 21 (L) 10/25/2014 0004   CHOLHDL 5.8 10/25/2014 0004   VLDL 50 (H) 10/25/2014 0004   LDLCALC 50 10/25/2014 0004    Additional studies/ records that were reviewed today include:  Jan 2016 Cath PCI Note: She was given an additional 5000 units IV heparin x 1. ACT was over 200. She was given Brilinta 180 mg po x 1.    Lesion # 1 Distal RCA: JR4 guiding catheter used to engage the RCA. Cougar IC wire down the RCA. 2.0 x 15 mm balloon x 1 for pre-dilatation. 2.25 x 16 mm Promus Premier DES x 1 distal RCA post-dilated with a 2.5 x 12 mm [place] balloon x 1. Stenosis taken from 90% down to 0%.    Lesion # 2 mid RCA: 2.0 x 15 mm balloon x 1. 3.0 x 12 mm Promus Premier DES x 1. The mid stenosis was followed by an aneurysmal segment with a size mismatch before the distal vessel so stents were not overlapped. Post-dilatation with a 3.5 x 8 mm [place] balloon x 1. Stenosis taken from 95% down to 0%.    Lesion #3 mid LAD: XB LAD 3.5 guiding catheter to engage left main. 2.5 x 12 mm balloon x 1 for pre-dilatation. 3.5 x 16 mm Promus Premier DES x 1 mid LAD. Stent post-dilated with a 3.5 x 8 mm [place] balloon x 2.  Stenosis taken from 80% down to 0%.   The sheath was removed from the right radial artery and a Terumo hemostasis band was applied at the arteriotomy site on the right wrist. There were no immediate complications. The patient was taken to the recovery area in stable condition.    Hemodynamic Findings: Central aortic pressure: 115/66 Left ventricular pressure: 121/3/14   Angiographic Findings:   Left main: No obstructive disease.     Left Anterior Descending Artery: Large caliber vessel that courses to the apex. 80% mid stenosis. Small caliber diagonal branch. The distal LAD tapers to a small caliber vessel.     Circumflex Artery: Large caliber vessel with 20% proximal stenosis.     Right Coronary Artery: Large dominant vessel with diffuse 30% proximal stenosis, 95% mid stenosis followed by an aneurysmal segment. Distal 90% stenosis. Diffuse mild plaque in the posterolateral artery and PDA.    Left Ventricular Angiogram: LVEF=55-60%.    Impression: 1. Severe double vessel CAD 2. NSTEMI 3. Normal LV systolic function 4. Successful PTCA/DES x 1 mid RCA and PTCA/DES x 1 distal RCA (stents not overlapped due to size mismatch in the vessel and the presence of an aneurysmal segment in the distal segment of the mid vessel) 5. Successful PTCA/DES x 1 mid LAD   Recommendations: Will need dual anti-platelet therapy with ASA and Brilinta for at least one year. Continue beta blocker and statin. Tobacco cessation.          04/2015 echo Study Conclusions  - Left ventricle: The cavity size was normal. Wall thickness was   increased in a pattern of mild LVH. Systolic function was normal.   The estimated ejection fraction was in the range of 55%  to 60%.   Wall motion was normal; there were no regional wall motion   abnormalities. Left ventricular diastolic function parameters   were normal. - Aortic valve: Mildly calcified annulus. Trileaflet; mildly   thickened leaflets. Valve area (VTI): 2.07  cm^2. Valve area   (Vmax): 2.2 cm^2. - Atrial septum: No defect or patent foramen ovale was identified. - Technically adequate study.   11/2015 DSE ------------------------------------------------------------------- Stress echo results:     Left ventricular ejection fraction was normal at rest and with stress. Normal echo stress   05/2016 Nuclear stress [place] Severe gut uptake, limited study. No clear ischemia       11/2018 nuclear stress    There was no ST segment deviation noted during stress.  The study is normal. There are no perfusion defects consistent with prior infarct or current ischemia  This is a low risk study.  The left ventricular ejection fraction is normal (55-65%).    Risk Assessment/Calculations:         ASSESSMENT:    1. Other chest pain   2. Coronary artery disease involving native coronary artery of native heart without angina pectoris   3. Essential hypertension   4. Mixed hyperlipidemia   5. OSA (obstructive sleep apnea)      PLAN:  In order of problems listed above:  Chest pain in the setting of possible Covid positive infection. Somewhat atypical and under a lot of stress. Will check echo.No shortness of breath and O2 sat 95% so doubt PE. If continues to have chest pain consider Lexiscan  CAD status post NSTEMI 10/2014 treated with DES to the mid RCA and DES to the distal RCA and DES to the mid LAD, NST 2020 no ischemia, history of sharp nagging chest pain at rest. Will check echo  Hypertension BP controlled  Hyperlipidemia managed by PCP  OSA on CPAP  DM type II managed by PP, on a statin  Shared Decision Making/Informed Consent        Medication Adjustments/Labs and Tests Ordered: Current medicines are reviewed at length with the patient today.  Concerns regarding medicines are outlined above.  Medication changes, Labs and Tests ordered today are listed in the Patient Instructions below. Patient Instructions  Medication  Instructions:  Your physician recommends that you continue on your current medications as directed. Please refer to the Current Medication list given to you today.  *If you need a refill on your cardiac medications before your next appointment, please call your pharmacy*   Lab Work: None If you have labs (blood work) drawn today and your tests are completely normal, you will receive your results only by: Marland Kitchen MyChart Message (if you have MyChart) OR . A paper copy in the mail If you have any lab test that is abnormal or we need to change your treatment, we will call you to review the results.   Testing/Procedures: Your physician has requested that you have an echocardiogram. Echocardiography is a painless test that uses sound waves to create images of your heart. It provides your doctor with information about the size and shape of your heart and how well your heart's chambers and valves are working. This procedure takes approximately one hour. There are no restrictions for this procedure.  Follow-Up: At [facility], you and your health needs are our priority.  As part of our continuing mission to provide you with exceptional heart care, we have created designated Provider Care Teams.  These Care Teams include your primary Cardiologist (physician) and  Advanced Practice Providers (APPs -  Physician Assistants and Nurse Practitioners) who all work together to provide you with the care you need, when you need it.  Your next appointment:   2 month(s)  The format for your next appointment:   In Person  Provider:   You may see Dina RichBranch, Jonathan, MD or one of the following Advanced Practice Providers on your designated Care Team:    Randall AnBrittany Strader, PA-C   Jacolyn ReedyMichele Madelyne Millikan, PA-C       Signed, Jacolyn ReedyMichele Damia Bobrowski, [place]-C  11/14/2020 11:20 AM    [facility] [address], [place], [place]  [address] Phone: [phone]; Fax: [phone]

## 2020-11-12 NOTE — Telephone Encounter (Signed)
Patient came down with Covid 10 days ago and developed constant chest pain for two days which then stopped. Then yesterday she had right sided chest pain with radiation to her right arm. Denies SOC,N/V but did feel some pain in the right side of her ear and jaw. She took one NTG and pain was relieved. Currently w/o pain.

## 2020-11-14 ENCOUNTER — Other Ambulatory Visit: Payer: Self-pay

## 2020-11-14 ENCOUNTER — Ambulatory Visit (INDEPENDENT_AMBULATORY_CARE_PROVIDER_SITE_OTHER): Payer: Medicare Other | Admitting: Physician Assistant

## 2020-11-14 ENCOUNTER — Encounter: Payer: Self-pay | Admitting: Physician Assistant

## 2020-11-14 VITALS — BP 142/84 | HR 99 | Ht 62.0 in | Wt 182.0 lb

## 2020-11-14 DIAGNOSIS — M25572 Pain in left ankle and joints of left foot: Secondary | ICD-10-CM

## 2020-11-14 DIAGNOSIS — I2510 Atherosclerotic heart disease of native coronary artery without angina pectoris: Secondary | ICD-10-CM

## 2020-11-14 DIAGNOSIS — R0789 Other chest pain: Secondary | ICD-10-CM | POA: Diagnosis not present

## 2020-11-14 DIAGNOSIS — G4733 Obstructive sleep apnea (adult) (pediatric): Secondary | ICD-10-CM

## 2020-11-14 DIAGNOSIS — I10 Essential (primary) hypertension: Secondary | ICD-10-CM | POA: Diagnosis not present

## 2020-11-14 DIAGNOSIS — E782 Mixed hyperlipidemia: Secondary | ICD-10-CM

## 2020-11-14 NOTE — Patient Instructions (Signed)
Medication Instructions:  Your physician recommends that you continue on your current medications as directed. Please refer to the Current Medication list given to you today.  *If you need a refill on your cardiac medications before your next appointment, please call your pharmacy*   Lab Work: None If you have labs (blood work) drawn today and your tests are completely normal, you will receive your results only by: Marland Kitchen MyChart Message (if you have MyChart) OR . A paper copy in the mail If you have any lab test that is abnormal or we need to change your treatment, we will call you to review the results.   Testing/Procedures: Your physician has requested that you have an echocardiogram. Echocardiography is a painless test that uses sound waves to create images of your heart. It provides your doctor with information about the size and shape of your heart and how well your heart's chambers and valves are working. This procedure takes approximately one hour. There are no restrictions for this procedure.  Follow-Up: At [facility], you and your health needs are our priority.  As part of our continuing mission to provide you with exceptional heart care, we have created designated Provider Care Teams.  These Care Teams include your primary Cardiologist (physician) and Advanced Practice Providers (APPs -  Physician Assistants and Nurse Practitioners) who all work together to provide you with the care you need, when you need it.  Your next appointment:   2 month(s)  The format for your next appointment:   In Person  Provider:   You may see Dina Rich, MD or one of the following Advanced Practice Providers on your designated Care Team:    [place], PA-C   Jacolyn Reedy, [place]

## 2020-11-19 ENCOUNTER — Other Ambulatory Visit: Payer: Self-pay

## 2020-11-19 ENCOUNTER — Ambulatory Visit (HOSPITAL_COMMUNITY)
Admission: RE | Admit: 2020-11-19 | Discharge: 2020-11-19 | Disposition: A | Discharge status: Home / Self Care | Payer: Medicare Other | Source: Ambulatory Visit | Attending: Physician Assistant | Admitting: Physician Assistant

## 2020-11-19 DIAGNOSIS — R0789 Other chest pain: Secondary | ICD-10-CM | POA: Insufficient documentation

## 2020-11-19 LAB — ECHOCARDIOGRAM COMPLETE
Area-P 1/2: 3.81 cm2
S' Lateral: 2.5 cm

## 2020-11-19 NOTE — Progress Notes (Signed)
*  PRELIMINARY RESULTS* Echocardiogram 2D Echocardiogram has been performed.  Tanya Lowe 11/19/2020, 9:13 AM

## 2020-11-20 ENCOUNTER — Telehealth: Payer: Self-pay

## 2020-11-20 NOTE — Telephone Encounter (Signed)
Contacted patient and she verbalized understanding of her echo results. She said she had no questions or concerns at this time.

## 2020-11-20 NOTE — Telephone Encounter (Signed)
-----   Message from Ellsworth Lennox, [place] sent at 11/19/2020  7:41 PM EST ----- Covering for Elon Jester - Please let the patient know her echocardiogram showed normal pumping function of the heart with a preserved EF of 65-70%. There was mild thickness of the heart muscle which is common to see with hypertension and good blood pressure control is essential. Trivial leakage along the mitral and aortic valve but no significant abnormalities. Overall, a reassuring study.

## 2020-11-22 NOTE — Progress Notes (Signed)
Order(s) created erroneously. Erroneous order ID: [id number]  Order moved by: SHEALY, DEBRA D  Order move date/time: 11/22/2020 11:11 AM  Source Patient: [id number]  Source Contact: 11/14/2020  Destination Patient: [id number]  Destination Contact: 06/07/2020 

## 2021-01-16 ENCOUNTER — Ambulatory Visit: Payer: Medicare Other | Admitting: Cardiology

## 2021-02-02 LAB — COLOGUARD: COLOGUARD: NEGATIVE

## 2021-02-02 LAB — EXTERNAL GENERIC LAB PROCEDURE: COLOGUARD: NEGATIVE

## 2021-05-03 ENCOUNTER — Telehealth (INDEPENDENT_AMBULATORY_CARE_PROVIDER_SITE_OTHER): Payer: Medicare Other | Admitting: Cardiology

## 2021-05-03 ENCOUNTER — Encounter: Payer: Self-pay | Admitting: Cardiology

## 2021-05-03 ENCOUNTER — Other Ambulatory Visit: Payer: Self-pay

## 2021-05-03 VITALS — Ht 62.5 in | Wt 178.0 lb

## 2021-05-03 DIAGNOSIS — I10 Essential (primary) hypertension: Secondary | ICD-10-CM | POA: Diagnosis not present

## 2021-05-03 DIAGNOSIS — I2510 Atherosclerotic heart disease of native coronary artery without angina pectoris: Secondary | ICD-10-CM

## 2021-05-03 DIAGNOSIS — E782 Mixed hyperlipidemia: Secondary | ICD-10-CM

## 2021-05-03 NOTE — Patient Instructions (Signed)
Medication Instructions:  Your physician recommends that you continue on your current medications as directed. Please refer to the Current Medication list given to you today.  *If you need a refill on your cardiac medications before your next appointment, please call your pharmacy*   Lab Work: We have requested your most recent lab work from your primary care provider If you have labs (blood work) drawn today and your tests are completely normal, you will receive your results only by: MyChart Message (if you have MyChart) OR A paper copy in the mail If you have any lab test that is abnormal or we need to change your treatment, we will call you to review the results.   Testing/Procedures: None   Follow-Up: At [facility], you and your health needs are our priority.  As part of our continuing mission to provide you with exceptional heart care, we have created designated Provider Care Teams.  These Care Teams include your primary Cardiologist (physician) and Advanced Practice Providers (APPs -  Physician Assistants and Nurse Practitioners) who all work together to provide you with the care you need, when you need it.  We recommend signing up for the patient portal called "MyChart".  Sign up information is provided on this After Visit Summary.  MyChart is used to connect with patients for Virtual Visits (Telemedicine).  Patients are able to view lab/test results, encounter notes, upcoming appointments, etc.  Non-urgent messages can be sent to your provider as well.   To learn more about what you can do with MyChart, go to [email or web address].    Your next appointment:   6 month(s)  The format for your next appointment:   In Person  Provider:   Jonathan Branch, MD   Other Instructions    

## 2021-05-03 NOTE — Progress Notes (Signed)
Virtual Visit via Telephone Note   This visit type was conducted due to national recommendations for restrictions regarding the COVID-19 Pandemic (e.g. social distancing) in an effort to limit this patient's exposure and mitigate transmission in our community.  Due to her co-morbid illnesses, this patient is at least at moderate risk for complications without adequate follow up.  This format is felt to be most appropriate for this patient at this time.  The patient did not have access to video technology/had technical difficulties with video requiring transitioning to audio format only (telephone).  All issues noted in this document were discussed and addressed.  No physical exam could be performed with this format.  Please refer to the patient's chart for her  consent to telehealth for [facility].    Date:  05/03/2021   ID:  Tanya Lowe, DOB 12/09/1970, MRN [id number] The patient was identified using 2 identifiers.  Patient Location: Home Provider Location: Office/Clinic   PCP:  Zhou-Talbert, Ralene Bathe, MD   [facility] Providers Cardiologist:  Dina Rich, MD     Evaluation Performed:  Follow-Up Visit  Chief Complaint:  Follow up  History of Present Illness:    Tanya Lowe is a 50 y.o. female seen today for follow up of the following medical problems.   1. CAD - history of NSTEMI Jan 2016, s/p DES to mid RCA and DES to distal RCA and DES to mid LAD.   - 04/2015 echo LVEF 55-60% -11/2015 DSE without ischemia - 05/2016 nuclear stress [place] no clear ischemia, limited by signicant gut radiotrace uptake   11/2018 nuclear stress: no ischemia    - can be sharp/nagging pain chest pain, mid to right sided. Most often with rest, no other associated symptoms. Lasts a few minutes. Occurs few times a week. Numbness bother arms R>L. Not positional. - has not tried nitro. Can be better with additional with ASA. - mild SOB with activities. Not related to eating.   - cannot run  on treadmill due to back pain.     Jan 2022 echo: LVEF 65-70%, no WMAs, normal diastolic, normal RV  - no recent chest pain. No SOB or DOE - compliant with meds    2. HTN -she is compliant with meds   3. Hyperlipidemia 07/2017 TC 103 TG 106 HDL 39 LDL 45 -- she is compliant with statin   4. OSA - followed by Dr Frances Furbish neurology - she remains compliant with CPAP   5. Chronic back pain - prior nerve stimulator surgery     6. DM2 - followed by pcp    The patient does not have symptoms concerning for COVID-19 infection (fever, chills, cough, or new shortness of breath).    Past Medical History:  Diagnosis Date   Arthritis    Bilateral hand pain    Cervical radiculopathy    Chronic neck and back pain    Fibromyalgia    Hypertension    Kidney stones    hx of w previous lithotripsy   MI (myocardial infarction) (HCC)    Pain management    [facility]   Thoracic back pain    Past Surgical History:  Procedure Laterality Date   CARDIAC SURGERY     CYSTOSCOPY WITH RETROGRADE PYELOGRAM, URETEROSCOPY AND STENT PLACEMENT Left 03/14/2015   Procedure: CYSTOSCOPY WITH LEFT RETROGRADE PYELOGRAM;  Surgeon: Alfredo Martinez, MD;  Location: [facility];  Service: Urology;  Laterality: Left;   CYSTOSCOPY WITH RETROGRADE PYELOGRAM, URETEROSCOPY AND STENT PLACEMENT Left  03/20/2015   Procedure: CYSTOSCOPY WITH LEFT  RETROGRADE LEFT URETEROSCOPY AND LEFT  STENT ;  Surgeon: Bjorn Pippin, MD;  Location: [facility];  Service: Urology;  Laterality: Left;   HOLMIUM LASER APPLICATION Left 03/20/2015   Procedure: HOLMIUM LASER APPLICATION;  Surgeon: Bjorn Pippin, MD;  Location: [facility];  Service: Urology;  Laterality: Left;   LEFT HEART CATHETERIZATION WITH CORONARY ANGIOGRAM N/A 10/25/2014   Procedure: LEFT HEART CATHETERIZATION WITH CORONARY ANGIOGRAM;  Surgeon: Kathleene Hazel, MD;  Location: [facility] CATH LAB;  Service: Cardiovascular;  Laterality: N/A;   LITHOTRIPSY  about 2011   SPINAL FUSION     L5-S1   TUBAL  LIGATION       Current Meds  Medication Sig   acetaminophen (TYLENOL) 500 MG tablet Take 1,000 mg by mouth every 6 (six) hours as needed for mild pain.    albuterol (PROVENTIL HFA;VENTOLIN HFA) 108 (90 Base) MCG/ACT inhaler Inhale 1-2 puffs into the lungs every 6 (six) hours as needed for wheezing or shortness of breath.   aspirin EC 81 MG tablet Take 81 mg by mouth daily.   atorvastatin (LIPITOR) 80 MG tablet Take 1 tablet (80 mg total) by mouth daily at 6 PM.   busPIRone (BUSPAR) 7.5 MG tablet Take 7.5 mg by mouth 2 (two) times daily.   Cetirizine HCl (ZYRTEC PO) Take by mouth.   DULoxetine (CYMBALTA) 60 MG capsule Take 90 mg by mouth daily.   fluticasone (FLONASE) 50 MCG/ACT nasal spray Place 2 sprays into both nostrils daily as needed for allergies.    gabapentin (NEURONTIN) 600 MG tablet Take 1,200 mg by mouth 3 (three) times daily.   lisinopril (PRINIVIL,ZESTRIL) 2.5 MG tablet Take 1 tablet (2.5 mg total) by mouth daily.   metFORMIN (GLUCOPHAGE) 500 MG tablet Take 500 mg by mouth 2 (two) times daily.   metoprolol tartrate (LOPRESSOR) 25 MG tablet Take 1 tablet (25 mg total) by mouth 2 (two) times daily.   naproxen (NAPROSYN) 500 MG tablet Take 500 mg by mouth 2 (two) times daily.   nitroGLYCERIN (NITROSTAT) 0.4 MG SL tablet Place 1 tablet (0.4 mg total) under the tongue every 5 (five) minutes as needed for chest pain.   omeprazole (PRILOSEC) 20 MG capsule Take 20 mg by mouth daily.   oxyCODONE-acetaminophen (PERCOCET) 10-325 MG per tablet Take 1 tablet by mouth 2 (two) times daily.   tiZANidine (ZANAFLEX) 4 MG tablet Take 4 mg by mouth 3 (three) times daily.     Allergies:   Lyrica [pregabalin], Bactrim [sulfamethoxazole-trimethoprim], and Prednisone   Social History   Tobacco Use   Smoking status: Every Day    Packs/day: 0.75    Years: 24.00    Pack years: 18.00    Types: Cigarettes    Start date: 10/25/1990   Smokeless tobacco: Never  Vaping Use   Vaping Use: Never used   Substance Use Topics   Alcohol use: No    Alcohol/week: 0.0 standard drinks   Drug use: No     Family Hx: The patient's family history includes Cancer in an other family member; Diabetes in her mother and another family member; Seizures in an other family member; Stroke in her mother.  ROS:   Please see the history of present illness.    All other systems reviewed and are negative.   Prior CV studies:   The following studies were reviewed today: Jan 2016 Cath PCI Note: She was given an additional 5000 units IV heparin x 1. ACT was  over 200. She was given Brilinta 180 mg po x 1.   Lesion # 1 Distal RCA: JR4 guiding catheter used to engage the RCA. Cougar IC wire down the RCA. 2.0 x 15 mm balloon x 1 for pre-dilatation. 2.25 x 16 mm Promus Premier DES x 1 distal RCA post-dilated with a 2.5 x 12 mm [place] balloon x 1. Stenosis taken from 90% down to 0%.   Lesion # 2 mid RCA: 2.0 x 15 mm balloon x 1. 3.0 x 12 mm Promus Premier DES x 1. The mid stenosis was followed by an aneurysmal segment with a size mismatch before the distal vessel so stents were not overlapped. Post-dilatation with a 3.5 x 8 mm [place] balloon x 1. Stenosis taken from 95% down to 0%.   Lesion #3 mid LAD: XB LAD 3.5 guiding catheter to engage left main. 2.5 x 12 mm balloon x 1 for pre-dilatation. 3.5 x 16 mm Promus Premier DES x 1 mid LAD. Stent post-dilated with a 3.5 x 8 mm [place] balloon x 2. Stenosis taken from 80% down to 0%.   The sheath was removed from the right radial artery and a Terumo hemostasis band was applied at the arteriotomy site on the right wrist. There were no immediate complications. The patient was taken to the recovery area in stable condition.   Hemodynamic Findings: Central aortic pressure: 115/66 Left ventricular pressure: 121/3/14   Angiographic Findings:   Left main: No obstructive disease.     Left Anterior Descending Artery: Large caliber vessel that courses to the apex. 80% mid stenosis. Small  caliber diagonal [place] Feig. The distal LAD tapers to a small caliber vessel.     Circumflex Artery: Large caliber vessel with 20% proximal stenosis.     Right Coronary Artery: Large dominant vessel with diffuse 30% proximal stenosis, 95% mid stenosis followed by an aneurysmal segment. Distal 90% stenosis. Diffuse mild plaque in the posterolateral artery and PDA.   Left Ventricular Angiogram: LVEF=55-60%.   Impression: 1. Severe double vessel CAD 2. NSTEMI 3. Normal LV systolic function 4. Successful PTCA/DES x 1 mid RCA and PTCA/DES x 1 distal RCA (stents not overlapped due to size mismatch in the vessel and the presence of an aneurysmal segment in the distal segment of the mid vessel) 5. Successful PTCA/DES x 1 mid LAD   Recommendations: Will need dual anti-platelet therapy with ASA and Brilinta for at least one year. Continue beta blocker and statin. Tobacco cessation.          04/2015 echo Study Conclusions  - Left ventricle: The cavity size was normal. Wall thickness was   increased in a pattern of mild LVH. Systolic function was normal.   The estimated ejection fraction was in the range of 55% to 60%.   Wall motion was normal; there were no regional wall motion   abnormalities. Left ventricular diastolic function parameters   were normal. - Aortic valve: Mildly calcified annulus. Trileaflet; mildly   thickened leaflets. Valve area (VTI): 2.07 cm^2. Valve area   (Vmax): 2.2 cm^2. - Atrial septum: No defect or patent foramen ovale was identified. - Technically adequate study.   11/2015 DSE ------------------------------------------------------------------- Stress echo results:     Left ventricular ejection fraction was normal at rest and with stress. Normal echo stress   05/2016 Nuclear stress [place] Severe gut uptake, limited study. No clear ischemia       11/2018 nuclear stress   There was no ST segment deviation noted during stress. The study  is normal. There are no  perfusion defects consistent with prior infarct or current ischemia This is a low risk study. The left ventricular ejection fraction is normal (55-65%).  Jan 2022 echo  IMPRESSIONS     1. Left ventricular ejection fraction, by estimation, is 65 to 70%. The  left ventricle has normal function. The left ventricle has no regional  wall motion abnormalities. There is mild left ventricular hypertrophy.  Left ventricular diastolic parameters  were normal.   2. Right ventricular systolic function is normal. The right ventricular  size is normal. Tricuspid regurgitation signal is inadequate for assessing  PA pressure.   3. The mitral valve is grossly normal. Trivial mitral valve  regurgitation.   4. The aortic valve is tricuspid. Aortic valve regurgitation is trivial.   5. The inferior vena cava is normal in size with greater than 50%  respiratory variability, suggesting right atrial pressure of 3 mmHg.   Labs/Other Tests and Data Reviewed:    EKG:  No ECG reviewed.  Recent Labs: No results found for requested labs within last 8760 hours.   Recent Lipid Panel Lab Results  Component Value Date/Time   CHOL 121 10/25/2014 12:04 AM   TRIG 251 (H) 10/25/2014 12:04 AM   HDL 21 (L) 10/25/2014 12:04 AM   CHOLHDL 5.8 10/25/2014 12:04 AM   LDLCALC 50 10/25/2014 12:04 AM    Wt Readings from Last 3 Encounters:  05/03/21 178 lb (80.7 kg)  11/14/20 182 lb (82.6 kg)  05/03/20 180 lb (81.6 kg)     Risk Assessment/Calculations:          Objective:    Vital Signs:  Ht 5' 2.5" (1.588 m)   Wt 178 lb (80.7 kg)   BMI 32.04 kg/m      ASSESSMENT & PLAN:    1.CAD No symptoms, continue current meds   2. HTN - at goal from prior visits, continue currrent meds   3. Hyperlipidemia - request labs from pcp, continue atorvastatin  COVID-19 Education: The signs and symptoms of COVID-19 were discussed with the patient and how to seek care for testing (follow up with PCP or arrange  E-visit).  The importance of social distancing was discussed today.  Time:   Today, I have spent 17 minutes with the patient with telehealth technology discussing the above problems.     Medication Adjustments/Labs and Tests Ordered: Current medicines are reviewed at length with the patient today.  Concerns regarding medicines are outlined above.   Tests Ordered: No orders of the defined types were placed in this encounter.   Medication Changes: No orders of the defined types were placed in this encounter.   Follow Up:  In Person 6 months  Signed, Dina Rich, MD  05/03/2021 1:34 PM    [facility]

## 2021-05-07 ENCOUNTER — Telehealth (INDEPENDENT_AMBULATORY_CARE_PROVIDER_SITE_OTHER): Payer: Medicare Other | Admitting: Adult Health

## 2021-05-07 DIAGNOSIS — G4733 Obstructive sleep apnea (adult) (pediatric): Secondary | ICD-10-CM | POA: Diagnosis not present

## 2021-05-07 DIAGNOSIS — Z9989 Dependence on other enabling machines and devices: Secondary | ICD-10-CM | POA: Diagnosis not present

## 2021-05-07 DIAGNOSIS — I2510 Atherosclerotic heart disease of native coronary artery without angina pectoris: Secondary | ICD-10-CM

## 2021-05-07 NOTE — Progress Notes (Signed)
PATIENT: Tanya Lowe DOB: 11-19-1970  REASON FOR VISIT: follow up HISTORY FROM: patient PRIMARY NEUROLOGIST:   Virtual Visit via Video Note  I connected with Tanya Lowe on 05/07/21 at  2:45 PM EDT by a video enabled telemedicine application located remotely at [facility] and verified that I am speaking with the correct person using two identifiers who was located at their own home.   I discussed the limitations of evaluation and management by telemedicine and the availability of in person appointments. The patient expressed understanding and agreed to proceed.   PATIENT: Tanya Lowe DOB: 1970/11/25  REASON FOR VISIT: follow up HISTORY FROM: patient  HISTORY OF PRESENT ILLNESS: Today 05/07/21:  Tanya Lowe is a 50 year old female with a history of obstructive sleep apnea on CPAP she returns today for follow-up.  She reports that the CPAP is working well for her.  She denies any new issues.  She returns today for evaluation.     REVIEW OF SYSTEMS: Out of a complete 14 system review of symptoms, the patient complains only of the following symptoms, and all other reviewed systems are negative.  See HPI  ALLERGIES: Allergies  Allergen Reactions   Lyrica [Pregabalin] Shortness Of Breath    Chest Pain    Bactrim [Sulfamethoxazole-Trimethoprim] Rash   Prednisone Palpitations and Hypertension         HOME MEDICATIONS: Outpatient Medications Prior to Visit  Medication Sig Dispense Refill   acetaminophen (TYLENOL) 500 MG tablet Take 1,000 mg by mouth every 6 (six) hours as needed for mild pain.      albuterol (PROVENTIL HFA;VENTOLIN HFA) 108 (90 Base) MCG/ACT inhaler Inhale 1-2 puffs into the lungs every 6 (six) hours as needed for wheezing or shortness of breath.     aspirin EC 81 MG tablet Take 81 mg by mouth daily.     atorvastatin (LIPITOR) 80 MG tablet Take 1 tablet (80 mg total) by mouth daily at 6 PM. 30 tablet 11   busPIRone (BUSPAR) 7.5 MG  tablet Take 7.5 mg by mouth 2 (two) times daily.     Cetirizine HCl (ZYRTEC PO) Take by mouth.     DULoxetine (CYMBALTA) 60 MG capsule Take 90 mg by mouth daily.     fluticasone (FLONASE) 50 MCG/ACT nasal spray Place 2 sprays into both nostrils daily as needed for allergies.      gabapentin (NEURONTIN) 600 MG tablet Take 1,200 mg by mouth 3 (three) times daily.     lisinopril (PRINIVIL,ZESTRIL) 2.5 MG tablet Take 1 tablet (2.5 mg total) by mouth daily. 90 tablet 3   metFORMIN (GLUCOPHAGE) 500 MG tablet Take 500 mg by mouth 2 (two) times daily.     metoprolol tartrate (LOPRESSOR) 25 MG tablet Take 1 tablet (25 mg total) by mouth 2 (two) times daily. 180 tablet 3   naproxen (NAPROSYN) 500 MG tablet Take 500 mg by mouth 2 (two) times daily.     nitroGLYCERIN (NITROSTAT) 0.4 MG SL tablet Place 1 tablet (0.4 mg total) under the tongue every 5 (five) minutes as needed for chest pain. 25 tablet 3   omeprazole (PRILOSEC) 20 MG capsule Take 20 mg by mouth daily.     oxyCODONE-acetaminophen (PERCOCET) 10-325 MG per tablet Take 1 tablet by mouth 2 (two) times daily.     tiZANidine (ZANAFLEX) 4 MG tablet Take 4 mg by mouth 3 (three) times daily.     No facility-administered medications prior to visit.    PAST  MEDICAL HISTORY: Past Medical History:  Diagnosis Date   Arthritis    Bilateral hand pain    Cervical radiculopathy    Chronic neck and back pain    Fibromyalgia    Hypertension    Kidney stones    hx of w previous lithotripsy   MI (myocardial infarction) (HCC)    Pain management    [facility]   Thoracic back pain     PAST SURGICAL HISTORY: Past Surgical History:  Procedure Laterality Date   CARDIAC SURGERY     CYSTOSCOPY WITH RETROGRADE PYELOGRAM, URETEROSCOPY AND STENT PLACEMENT Left 03/14/2015   Procedure: CYSTOSCOPY WITH LEFT RETROGRADE PYELOGRAM;  Surgeon: Alfredo Martinez, MD;  Location: [facility];  Service: Urology;  Laterality: Left;   CYSTOSCOPY WITH RETROGRADE PYELOGRAM, URETEROSCOPY  AND STENT PLACEMENT Left 03/20/2015   Procedure: CYSTOSCOPY WITH LEFT  RETROGRADE LEFT URETEROSCOPY AND LEFT  STENT ;  Surgeon: Bjorn Pippin, MD;  Location: [facility];  Service: Urology;  Laterality: Left;   HOLMIUM LASER APPLICATION Left 03/20/2015   Procedure: HOLMIUM LASER APPLICATION;  Surgeon: Bjorn Pippin, MD;  Location: [facility];  Service: Urology;  Laterality: Left;   LEFT HEART CATHETERIZATION WITH CORONARY ANGIOGRAM N/A 10/25/2014   Procedure: LEFT HEART CATHETERIZATION WITH CORONARY ANGIOGRAM;  Surgeon: Kathleene Hazel, MD;  Location: [facility] CATH LAB;  Service: Cardiovascular;  Laterality: N/A;   LITHOTRIPSY  about 2011   SPINAL FUSION     L5-S1   TUBAL LIGATION      FAMILY HISTORY: Family History  Problem Relation Age of Onset   Diabetes Mother    Stroke Mother    Seizures Other    Cancer Other    Diabetes Other     SOCIAL HISTORY: Social History   Socioeconomic History   Marital status: Divorced    Spouse name: Not on file   Number of children: Not on file   Years of education: Not on file   Highest education level: Not on file  Occupational History   Not on file  Tobacco Use   Smoking status: Every Day    Packs/day: 0.75    Years: 24.00    Pack years: 18.00    Types: Cigarettes    Start date: 10/25/1990   Smokeless tobacco: Never  Vaping Use   Vaping Use: Never used  Substance and Sexual Activity   Alcohol use: No    Alcohol/week: 0.0 standard drinks   Drug use: No   Sexual activity: Not on file  Other Topics Concern   Not on file  Social History Narrative   Not on file   Social Determinants of Health   Financial Resource Strain: Not on file  Food Insecurity: Not on file  Transportation Needs: Not on file  Physical Activity: Not on file  Stress: Not on file  Social Connections: Not on file  Intimate Partner Violence: Not on file      PHYSICAL EXAM Generalized: Well developed, in no acute distress   Neurological examination  Mentation: Alert  oriented to time, place, history taking. Follows all commands speech and language fluent Cranial nerve II-XII:Extraocular movements were full. Facial symmetry noted. uvula tongue midline. Head turning and shoulder shrug  were normal and symmetric. Motor: Good strength throughout subjectively per patient Sensory: Sensory testing is intact to soft touch on all 4 extremities subjectively per patient Coordination: Cerebellar testing reveals good finger-nose-finger  Gait and station: Patient is able to stand from a seated position. gait is normal.  Reflexes: UTA  DIAGNOSTIC DATA (LABS, IMAGING, TESTING) - I reviewed patient records, labs, notes, testing and imaging myself where available.  Lab Results  Component Value Date   WBC 8.5 07/30/2015   HGB 13.4 07/30/2015   HCT 40.7 07/30/2015   MCV 86.4 07/30/2015   PLT 254 07/30/2015      Component Value Date/Time   NA 140 07/30/2015 1218   K 3.9 07/30/2015 1218   CL 106 07/30/2015 1218   CO2 27 07/30/2015 1218   GLUCOSE 132 (H) 07/30/2015 1218   BUN 9 07/30/2015 1218   CREATININE 0.75 07/30/2015 1218   CALCIUM 8.6 (L) 07/30/2015 1218   PROT 7.2 03/14/2015 0020   ALBUMIN 3.3 (L) 03/14/2015 0020   AST 28 03/14/2015 0020   ALT 22 03/14/2015 0020   ALKPHOS 95 03/14/2015 0020   BILITOT 0.4 03/14/2015 0020   GFRNONAA >60 07/30/2015 1218   GFRAA >60 07/30/2015 1218   Lab Results  Component Value Date   CHOL 121 10/25/2014   HDL 21 (L) 10/25/2014   LDLCALC 50 10/25/2014   TRIG 251 (H) 10/25/2014   CHOLHDL 5.8 10/25/2014   Lab Results  Component Value Date   HGBA1C 5.9 (H) 10/24/2014   No results found for: VITAMINB12 Lab Results  Component Value Date   TSH 2.092 10/24/2014      ASSESSMENT AND PLAN 50 y.o. year old female  has a past medical history of Arthritis, Bilateral hand pain, Cervical radiculopathy, Chronic neck and back pain, Fibromyalgia, Hypertension, Kidney stones, MI (myocardial infarction) (HCC), Pain  management, and Thoracic back pain. here with:  OSA on CPAP  CPAP compliance excellent Residual AHI is good Encouraged patient to continue using CPAP nightly and > 4 hours each night F/U in 1 year or sooner if needed   Butch Penny, MSN, NP-C 05/07/2021, 3:29 PM [facility] [address] [place], [place] [address] [phone]

## 2021-11-07 ENCOUNTER — Ambulatory Visit: Payer: Medicare Other | Admitting: Cardiology

## 2021-11-11 ENCOUNTER — Telehealth: Payer: Self-pay | Admitting: Student

## 2021-11-11 NOTE — Telephone Encounter (Signed)
Patient states she was exposed to covid and would like to know if she can have her appointment 11/13/21 virtually.

## 2021-11-11 NOTE — Telephone Encounter (Signed)
Pt notified that she can have her appt virtually.

## 2021-11-13 ENCOUNTER — Ambulatory Visit (INDEPENDENT_AMBULATORY_CARE_PROVIDER_SITE_OTHER): Payer: Medicare Other | Admitting: Student

## 2021-11-13 ENCOUNTER — Other Ambulatory Visit: Payer: Self-pay

## 2021-11-13 ENCOUNTER — Encounter: Payer: Self-pay | Admitting: Student

## 2021-11-13 VITALS — Ht 62.0 in | Wt 188.0 lb

## 2021-11-13 DIAGNOSIS — G4733 Obstructive sleep apnea (adult) (pediatric): Secondary | ICD-10-CM

## 2021-11-13 DIAGNOSIS — I2510 Atherosclerotic heart disease of native coronary artery without angina pectoris: Secondary | ICD-10-CM

## 2021-11-13 DIAGNOSIS — I10 Essential (primary) hypertension: Secondary | ICD-10-CM | POA: Diagnosis not present

## 2021-11-13 DIAGNOSIS — E785 Hyperlipidemia, unspecified: Secondary | ICD-10-CM

## 2021-11-13 NOTE — Progress Notes (Signed)
Virtual Visit via Telephone Note   This visit type was conducted due to national recommendations for restrictions regarding the COVID-19 Pandemic (e.g. social distancing) in an effort to limit this patient's exposure and mitigate transmission in our community.  Due to her co-morbid illnesses, this patient is at least at moderate risk for complications without adequate follow up.  This format is felt to be most appropriate for this patient at this time.  The patient did not have access to video technology/had technical difficulties with video requiring transitioning to audio format only (telephone).  All issues noted in this document were discussed and addressed.  No physical exam could be performed with this format.  Please refer to the patient's chart for her  consent to telehealth for [facility].   Date:  11/13/2021   ID:  Tanya Lowe, DOB 07/03/1971, MRN [id number] The patient was identified using 2 identifiers.  Patient Location: Home Provider Location: Office/Clinic  PCP:  Zhou-Talbert, Ralene BatheSerena S, MD   [facility] Providers Cardiologist:  Dina RichBranch, Jonathan, MD     Evaluation Performed:  Follow-Up Visit  Chief Complaint:  6 month visit  History of Present Illness:    Tanya Lowe is a 51 y.o. female with past medical history of CAD (s/p NSTEMI in 2016 with DES to mid-RCA, [facility] and DES to mid-LAD, low-risk NST in 05/2016 and 11/2018), HTN, HLD, prediabetes, fibromyalgia, OSA and continued tobacco use who presents for a 3663-month follow-up telehealth visit.  She most recently had a telehealth visit with Dr. Wyline MoodBranch in 04/2021 and denied any recent chest pain or dyspnea on exertion at that time. Her activity was limited secondary to back pain. She was continued on her current cardiac medications including ASA 81 mg daily, Atorvastatin 80 mg daily, Lisinopril 2.5 mg daily and Lopressor 25 mg twice daily.  In talking with the patient today, she reports having switched to a  Virtual Visit as she was exposed to COVID-19 earlier this week. She has tested twice and been negative but does report nausea, diarrhea and body aches for the past few days. She is concerned she might have the flu and has been taking OTC medications with improvement. We reviewed the importance of staying hydrated as well. She does have follow-up with her PCP in 2 days.  From a cardiac perspective, she denies any recent chest pain or dyspnea on exertion. Says she is mostly homebound due to her fibromyalgia and back pain but does go to the store occasionally. No recent orthopnea, PND or lower extremity edema. She does use her CPAP on a nightly basis.  Past Medical History:  Diagnosis Date   Arthritis    Bilateral hand pain    CAD (coronary artery disease)    a. s/p NSTEMI in 2016 with DES to mid-RCA, [facility] and DES to mid-LAD b. low-risk NST in 05/2016 and 11/2018   Cervical radiculopathy    Chronic neck and back pain    Fibromyalgia    Hypertension    Kidney stones    hx of w previous lithotripsy   MI (myocardial infarction) (HCC)    Pain management    [facility]   Thoracic back pain    Past Surgical History:  Procedure Laterality Date   CARDIAC SURGERY     CYSTOSCOPY WITH RETROGRADE PYELOGRAM, URETEROSCOPY AND STENT PLACEMENT Left 03/14/2015   Procedure: CYSTOSCOPY WITH LEFT RETROGRADE PYELOGRAM;  Surgeon: Alfredo MartinezScott MacDiarmid, MD;  Location: [facility];  Service: Urology;  Laterality:  Left;   CYSTOSCOPY WITH RETROGRADE PYELOGRAM, URETEROSCOPY AND STENT PLACEMENT Left 03/20/2015   Procedure: CYSTOSCOPY WITH LEFT  RETROGRADE LEFT URETEROSCOPY AND LEFT  STENT ;  Surgeon: Bjorn Pippin, MD;  Location: [facility];  Service: Urology;  Laterality: Left;   HOLMIUM LASER APPLICATION Left 03/20/2015   Procedure: HOLMIUM LASER APPLICATION;  Surgeon: Bjorn Pippin, MD;  Location: [facility];  Service: Urology;  Laterality: Left;   LEFT HEART CATHETERIZATION WITH CORONARY ANGIOGRAM N/A 10/25/2014   Procedure: LEFT HEART  CATHETERIZATION WITH CORONARY ANGIOGRAM;  Surgeon: Kathleene Hazel, MD;  Location: [facility] C[facility] LAB;  Service: Cardiovascular;  Laterality: N/A;   LITHOTRIPSY  about 2011   SPINAL FUSION     L5-S1   TUBAL LIGATION       Current Meds  Medication Sig   acetaminophen (TYLENOL) 500 MG tablet Take 1,000 mg by mouth every 6 (six) hours as needed for mild pain.    albuterol (PROVENTIL HFA;VENTOLIN HFA) 108 (90 Base) MCG/ACT inhaler Inhale 1-2 puffs into the lungs every 6 (six) hours as needed for wheezing or shortness of breath.   aspirin EC 81 MG tablet Take 81 mg by mouth daily.   atorvastatin (LIPITOR) 80 MG tablet Take 1 tablet (80 mg total) by mouth daily at 6 PM.   busPIRone (BUSPAR) 7.5 MG tablet Take 2 tablets by mouth 2 (two) times daily.   Cetirizine HCl (ZYRTEC PO) Take by mouth.   DULoxetine (CYMBALTA) 60 MG capsule Take 90 mg by mouth daily.   fluticasone (FLONASE) 50 MCG/ACT nasal spray Place 2 sprays into both nostrils daily as needed for allergies.    gabapentin (NEURONTIN) 600 MG tablet Take 1,200 mg by mouth 3 (three) times daily.   lisinopril (PRINIVIL,ZESTRIL) 2.5 MG tablet Take 1 tablet (2.5 mg total) by mouth daily.   metFORMIN (GLUCOPHAGE) 500 MG tablet Take 500 mg by mouth 2 (two) times daily.   metoprolol tartrate (LOPRESSOR) 25 MG tablet Take 1 tablet (25 mg total) by mouth 2 (two) times daily.   naproxen (NAPROSYN) 500 MG tablet Take 500 mg by mouth 2 (two) times daily.   nitroGLYCERIN (NITROSTAT) 0.4 MG SL tablet Place 1 tablet (0.4 mg total) under the tongue every 5 (five) minutes as needed for chest pain.   omeprazole (PRILOSEC) 20 MG capsule Take 20 mg by mouth daily.   oxyCODONE-acetaminophen (PERCOCET) 10-325 MG per tablet Take 1 tablet by mouth 2 (two) times daily.   tiZANidine (ZANAFLEX) 4 MG tablet Take 4 mg by mouth 3 (three) times daily.     Allergies:   Lyrica [pregabalin], Bactrim [sulfamethoxazole-trimethoprim], and Prednisone   Social History    Tobacco Use   Smoking status: Every Day    Packs/day: 1.00    Years: 24.00    Pack years: 24.00    Types: Cigarettes    Start date: 10/25/1990   Smokeless tobacco: Never  Vaping Use   Vaping Use: Never used  Substance Use Topics   Alcohol use: No    Alcohol/week: 0.0 standard drinks   Drug use: No     Family Hx: The patient's family history includes Cancer in an other family member; Diabetes in her mother and another family member; Seizures in an other family member; Stroke in her mother.  ROS:   Please see the history of present illness.     All other systems reviewed and are negative.   Prior CV studies:   The following studies were reviewed today:  NST: 11/2018 There was  no ST segment deviation noted during stress. The study is normal. There are no perfusion defects consistent with prior infarct or current ischemia This is a low risk study. The left ventricular ejection fraction is normal (55-65%).  Echocardiogram: 10/2020 IMPRESSIONS     1. Left ventricular ejection fraction, by estimation, is 65 to 70%. The  left ventricle has normal function. The left ventricle has no regional  wall motion abnormalities. There is mild left ventricular hypertrophy.  Left ventricular diastolic parameters  were normal.   2. Right ventricular systolic function is normal. The right ventricular  size is normal. Tricuspid regurgitation signal is inadequate for assessing  PA pressure.   3. The mitral valve is grossly normal. Trivial mitral valve  regurgitation.   4. The aortic valve is tricuspid. Aortic valve regurgitation is trivial.   5. The inferior vena cava is normal in size with greater than 50%  respiratory variability, suggesting right atrial pressure of 3 mmHg.   Labs/Other Tests and Data Reviewed:    EKG:  An ECG dated 11/14/2020 was personally reviewed today and demonstrated: NSR, HR 77 with baseline artifact. No distinct ST changes when compared to prior tracings.    Recent Labs: No results found for requested labs within last 8760 hours.   Recent Lipid Panel Lab Results  Component Value Date/Time   CHOL 121 10/25/2014 12:04 AM   TRIG 251 (H) 10/25/2014 12:04 AM   HDL 21 (L) 10/25/2014 12:04 AM   CHOLHDL 5.8 10/25/2014 12:04 AM   LDLCALC 50 10/25/2014 12:04 AM    Wt Readings from Last 3 Encounters:  11/13/21 188 lb (85.3 kg)  05/03/21 178 lb (80.7 kg)  11/14/20 182 lb (82.6 kg)         Objective:    Vital Signs:  Ht 5\' 2"  (1.575 m)    Wt 188 lb (85.3 kg)    BMI 34.39 kg/m    General: Pleasant female sounding in NAD Psych: Normal affect. Neuro: Alert and oriented X 3.  Lungs:  Resp regular and unlabored while talking on the phone.   ASSESSMENT & PLAN:    1. CAD - She is s/p NSTEMI in 2016 with DES to mid-RCA, [facility], and DES to mid-LAD with her most recent ischemic evaluation being a low-risk NST in 11/2018. - She denies any recent anginal symptoms. - Continue current medication regimen with ASA 81 mg daily, Atorvastatin 80 mg daily, Lopressor 25 mg twice daily and PRN SL NTG.   2. HTN - She does not have a way of checking her blood pressure today but has ordered a home BP cuff and we reviewed she could check this in the ambulatory setting and report back if BP is above goal. - Continue current medication regimen with Lisinopril 2.5mg  daily and Lopressor 25mg  BID.   3. HLD - Her LDL was at 39 in 03/2021 which is at goal. Remains on Atorvastatin 80mg  daily.   4. OSA - Continued compliance with CPAP was encouraged.   COVID-19 Education: The signs and symptoms of COVID-19 were discussed with the patient and how to seek care for testing (follow up with PCP or arrange E-visit). The importance of social distancing was discussed today.  Time:   Today, I have spent 15 minutes with the patient with telehealth technology discussing the above problems.     Medication Adjustments/Labs and Tests Ordered: Current medicines  are reviewed at length with the patient today.  Concerns regarding medicines are outlined above.  Tests Ordered: No orders of the defined types were placed in this encounter.   Medication Changes: No orders of the defined types were placed in this encounter.   Follow Up:  In Person in 6 month(s)  Signed, Ellsworth Lennox, Cordelia Poche  11/13/2021 12:46 PM    [facility]

## 2021-11-13 NOTE — Patient Instructions (Signed)
Medication Instructions:  °Your physician recommends that you continue on your current medications as directed. Please refer to the Current Medication list given to you today. ° °*If you need a refill on your cardiac medications before your next appointment, please call your pharmacy* ° ° °If you have labs (blood work) drawn today and your tests are completely normal, you will receive your results only by: °MyChart Message (if you have MyChart) OR °A paper copy in the mail °If you have any lab test that is abnormal or we need to change your treatment, we will call you to review the results. ° ° °Follow-Up: °At [facility], you and your health needs are our priority.  As part of our continuing mission to provide you with exceptional heart care, we have created designated Provider Care Teams.  These Care Teams include your primary Cardiologist (physician) and Advanced Practice Providers (APPs -  Physician Assistants and Nurse Practitioners) who all work together to provide you with the care you need, when you need it. ° °We recommend signing up for the patient portal called "MyChart".  Sign up information is provided on this After Visit Summary.  MyChart is used to connect with patients for Virtual Visits (Telemedicine).  Patients are able to view lab/test results, encounter notes, upcoming appointments, etc.  Non-urgent messages can be sent to your provider as well.   °To learn more about what you can do with MyChart, go to [email or web address].   ° °Your next appointment:   °6 month(s) ° °The format for your next appointment:   °In Person ° °Provider:   °Jonathan Branch, MD  ° ° °Other Instructions °Thank you for choosing [facility]! °  ° °

## 2021-11-18 ENCOUNTER — Other Ambulatory Visit (HOSPITAL_COMMUNITY): Payer: Self-pay | Admitting: Family Medicine

## 2021-11-18 ENCOUNTER — Other Ambulatory Visit: Payer: Self-pay | Admitting: Family Medicine

## 2021-11-18 DIAGNOSIS — Z720 Tobacco use: Secondary | ICD-10-CM

## 2021-11-25 ENCOUNTER — Other Ambulatory Visit: Payer: Self-pay | Admitting: Family Medicine

## 2021-11-25 DIAGNOSIS — Z1231 Encounter for screening mammogram for malignant neoplasm of breast: Secondary | ICD-10-CM

## 2021-12-23 ENCOUNTER — Other Ambulatory Visit: Payer: Self-pay

## 2021-12-23 ENCOUNTER — Ambulatory Visit (HOSPITAL_COMMUNITY)
Admission: RE | Admit: 2021-12-23 | Discharge: 2021-12-23 | Disposition: A | Discharge status: Home / Self Care | Payer: Medicare Other | Source: Ambulatory Visit | Attending: Family Medicine | Admitting: Family Medicine

## 2021-12-23 DIAGNOSIS — Z1231 Encounter for screening mammogram for malignant neoplasm of breast: Secondary | ICD-10-CM | POA: Diagnosis not present

## 2021-12-25 ENCOUNTER — Encounter (HOSPITAL_COMMUNITY): Payer: Self-pay

## 2021-12-25 ENCOUNTER — Ambulatory Visit (HOSPITAL_COMMUNITY): Payer: Medicaid Other

## 2021-12-25 ENCOUNTER — Ambulatory Visit (HOSPITAL_COMMUNITY): Payer: Medicare Other

## 2021-12-30 ENCOUNTER — Other Ambulatory Visit: Payer: Self-pay | Admitting: Emergency Medicine

## 2021-12-30 DIAGNOSIS — R809 Proteinuria, unspecified: Secondary | ICD-10-CM

## 2022-01-07 ENCOUNTER — Ambulatory Visit (HOSPITAL_COMMUNITY)
Admission: RE | Admit: 2022-01-07 | Discharge: 2022-01-07 | Disposition: A | Discharge status: Home / Self Care | Payer: Medicare Other | Source: Ambulatory Visit | Attending: Emergency Medicine | Admitting: Emergency Medicine

## 2022-01-07 ENCOUNTER — Other Ambulatory Visit: Payer: Self-pay

## 2022-01-07 DIAGNOSIS — R809 Proteinuria, unspecified: Secondary | ICD-10-CM | POA: Diagnosis present

## 2022-01-14 ENCOUNTER — Ambulatory Visit (HOSPITAL_COMMUNITY)
Admission: RE | Admit: 2022-01-14 | Discharge: 2022-01-14 | Disposition: A | Discharge status: Home / Self Care | Payer: Medicare Other | Source: Ambulatory Visit | Attending: Family Medicine | Admitting: Family Medicine

## 2022-01-14 ENCOUNTER — Other Ambulatory Visit: Payer: Self-pay

## 2022-01-14 DIAGNOSIS — Z720 Tobacco use: Secondary | ICD-10-CM | POA: Insufficient documentation

## 2022-01-14 DIAGNOSIS — Z122 Encounter for screening for malignant neoplasm of respiratory organs: Secondary | ICD-10-CM | POA: Insufficient documentation

## 2022-01-14 DIAGNOSIS — I2510 Atherosclerotic heart disease of native coronary artery without angina pectoris: Secondary | ICD-10-CM | POA: Insufficient documentation

## 2022-01-14 DIAGNOSIS — J439 Emphysema, unspecified: Secondary | ICD-10-CM | POA: Insufficient documentation

## 2022-01-14 DIAGNOSIS — I700 Atherosclerosis of aorta: Secondary | ICD-10-CM | POA: Diagnosis not present

## 2022-01-14 DIAGNOSIS — F17210 Nicotine dependence, cigarettes, uncomplicated: Secondary | ICD-10-CM | POA: Diagnosis not present

## 2022-01-16 ENCOUNTER — Encounter: Payer: Self-pay | Admitting: Cardiology

## 2022-01-20 ENCOUNTER — Other Ambulatory Visit: Payer: Self-pay | Admitting: Family Medicine

## 2022-01-20 ENCOUNTER — Other Ambulatory Visit (HOSPITAL_COMMUNITY): Payer: Self-pay | Admitting: Family Medicine

## 2022-01-20 DIAGNOSIS — R1013 Epigastric pain: Secondary | ICD-10-CM

## 2022-01-20 DIAGNOSIS — R591 Generalized enlarged lymph nodes: Secondary | ICD-10-CM

## 2022-01-20 DIAGNOSIS — R911 Solitary pulmonary nodule: Secondary | ICD-10-CM

## 2022-01-24 ENCOUNTER — Ambulatory Visit (HOSPITAL_COMMUNITY)
Admission: RE | Admit: 2022-01-24 | Discharge: 2022-01-24 | Disposition: A | Discharge status: Home / Self Care | Payer: Medicare Other | Source: Ambulatory Visit | Attending: Family Medicine | Admitting: Family Medicine

## 2022-01-24 DIAGNOSIS — R1013 Epigastric pain: Secondary | ICD-10-CM | POA: Insufficient documentation

## 2022-02-19 ENCOUNTER — Encounter: Payer: Self-pay | Admitting: Cardiology

## 2022-02-19 ENCOUNTER — Ambulatory Visit (INDEPENDENT_AMBULATORY_CARE_PROVIDER_SITE_OTHER): Payer: Medicare Other | Admitting: Cardiology

## 2022-02-19 VITALS — BP 114/72 | HR 84 | Ht 62.0 in | Wt 177.0 lb

## 2022-02-19 DIAGNOSIS — I10 Essential (primary) hypertension: Secondary | ICD-10-CM

## 2022-02-19 DIAGNOSIS — I2510 Atherosclerotic heart disease of native coronary artery without angina pectoris: Secondary | ICD-10-CM | POA: Diagnosis not present

## 2022-02-19 DIAGNOSIS — R0602 Shortness of breath: Secondary | ICD-10-CM | POA: Diagnosis not present

## 2022-02-19 NOTE — Patient Instructions (Signed)
Medication Instructions:  ?No changes ? ?Labwork: ?None ? ?Testing/Procedures: ?Pulmonary Functions Test ? ?Follow-Up: ?Follow up with Dr. Harl Bowie in 6 months ? ?Any Other Special Instructions Will Be Listed Below (If Applicable). ? ? ? ? ?If you need a refill on your cardiac medications before your next appointment, please call your pharmacy. ? ?

## 2022-02-19 NOTE — Progress Notes (Signed)
? ? ? ?Clinical Summary ?Ms. Tanya Lowe is a 51 y.o.female seen today for follow up of the following medical problems. ?  ?1. CAD ?- history of NSTEMI Jan 2016, s/p DES to mid RCA and DES to distal RCA and DES to mid LAD.   ?- 04/2015 echo LVEF 55-60% ?-11/2015 DSE without ischemia ?- 05/2016 nuclear stress [place] no clear ischemia, limited by signicant gut radiotrace uptake ?  ?11/2018 nuclear stress: no ischemia  ? ?  ?Jan 2022 echo: LVEF 65-70%, no WMAs, normal diastolic, normal RV ?  ? ?- no specific chest pains. Some recent SOB, recently diagnosed with emphysema.  ?12/2021. DOE with activities, occasional cough/wheezing. CT chest did show some emphysema. Will use prn albuterol with benefit.  ?  ? 2. HTN ?-she is compliant with meds ?  ?3. Hyperlipidemia ?07/2017 TC 103 TG 106 HDL 39 LDL 45 ?-- she is compliant with statin ? ?- 12/2020 TG 15 TC 92 HDL 31 LDL 39 ?  ?4. OSA ?- followed by Dr Tanya Lowe neurology ?- she remains compliant with CPAP ?  ?5. Chronic back pain ?- prior nerve stimulator surgery ?  ?  ?6. DM2 ?- followed by pcp ? ? ?Past Medical History:  ?Diagnosis Date  ? Arthritis   ? Bilateral hand pain   ? CAD (coronary artery disease)   ? a. s/p NSTEMI in 2016 with DES to mid-RCA, [facility] to [facility]-LAD b. low-risk NST in 05/2016 and 11/2018  ? Cervical radiculopathy   ? Chronic neck and back pain   ? Fibromyalgia   ? Hypertension   ? Kidney stones   ? hx of w previous lithotripsy  ? MI (myocardial infarction) (HCC)   ? Pain management   ? [facility]  ? Thoracic back pain   ? ? ? ?Allergies  ?Allergen Reactions  ? Lyrica [Pregabalin] Shortness Of Breath  ?  Chest Pain   ? Bactrim [Sulfamethoxazole-Trimethoprim] Rash  ? Prednisone Palpitations and Hypertension  ?   ?  ? ? ? ?Current Outpatient Medications  ?Medication Sig Dispense Refill  ? acetaminophen (TYLENOL) 500 MG tablet Take 1,000 mg by mouth every 6 (six) hours as needed for mild pain.     ? albuterol (PROVENTIL HFA;VENTOLIN HFA) 108 (90 Base)  MCG/ACT inhaler Inhale 1-2 puffs into the lungs every 6 (six) hours as needed for wheezing or shortness of breath.    ? aspirin EC 81 MG tablet Take 81 mg by mouth daily.    ? atorvastatin (LIPITOR) 80 MG tablet Take 1 tablet (80 mg total) by mouth daily at 6 PM. 30 tablet 11  ? busPIRone (BUSPAR) 7.5 MG tablet Take 2 tablets by mouth 2 (two) times daily.    ? Cetirizine HCl (ZYRTEC PO) Take by mouth.    ? DULoxetine (CYMBALTA) 60 MG capsule Take 90 mg by mouth daily.    ? fluticasone (FLONASE) 50 MCG/ACT nasal spray Place 2 sprays into both nostrils daily as needed for allergies.     ? gabapentin (NEURONTIN) 600 MG tablet Take 1,200 mg by mouth 3 (three) times daily.    ? lisinopril (PRINIVIL,ZESTRIL) 2.5 MG tablet Take 1 tablet (2.5 mg total) by mouth daily. 90 tablet 3  ? metFORMIN (GLUCOPHAGE) 500 MG tablet Take 500 mg by mouth 2 (two) times daily.    ? metoprolol tartrate (LOPRESSOR) 25 MG tablet Take 1 tablet (25 mg total) by mouth 2 (two) times daily. 180 tablet 3  ? naproxen (NAPROSYN) 500  MG tablet Take 500 mg by mouth 2 (two) times daily.    ? nitroGLYCERIN (NITROSTAT) 0.4 MG SL tablet Place 1 tablet (0.4 mg total) under the tongue every 5 (five) minutes as needed for chest pain. 25 tablet 3  ? omeprazole (PRILOSEC) 20 MG capsule Take 20 mg by mouth daily.    ? oxyCODONE-acetaminophen (PERCOCET) 10-325 MG per tablet Take 1 tablet by mouth 2 (two) times daily.    ? tiZANidine (ZANAFLEX) 4 MG tablet Take 4 mg by mouth 3 (three) times daily.    ? ?No current facility-administered medications for this visit.  ? ? ? ?Past Surgical History:  ?Procedure Laterality Date  ? CARDIAC SURGERY    ? CYSTOSCOPY WITH RETROGRADE PYELOGRAM, URETEROSCOPY AND STENT PLACEMENT Left 03/14/2015  ? Procedure: CYSTOSCOPY WITH LEFT RETROGRADE PYELOGRAM;  Surgeon: Tanya Martinez, MD;  Location: [facility];  Service: Urology;  Laterality: Left;  ? CYSTOSCOPY WITH RETROGRADE PYELOGRAM, URETEROSCOPY AND STENT PLACEMENT Left 03/20/2015  ?  Procedure: CYSTOSCOPY WITH LEFT  RETROGRADE LEFT URETEROSCOPY AND LEFT  STENT ;  Surgeon: Tanya Pippin, MD;  Location: [facility];  Service: Urology;  Laterality: Left;  ? HOLMIUM LASER APPLICATION Left 03/20/2015  ? Procedure: HOLMIUM LASER APPLICATION;  Surgeon: Tanya Pippin, MD;  Location: [facility];  Service: Urology;  Laterality: Left;  ? LEFT HEART CATHETERIZATION WITH CORONARY ANGIOGRAM N/A 10/25/2014  ? Procedure: LEFT HEART CATHETERIZATION WITH CORONARY ANGIOGRAM;  Surgeon: Tanya Hazel, MD;  Location: [facility] CATH LAB;  Service: Cardiovascular;  Laterality: N/A;  ? LITHOTRIPSY  about 2011  ? SPINAL FUSION    ? L5-S1  ? TUBAL LIGATION    ? ? ? ?Allergies  ?Allergen Reactions  ? Lyrica [Pregabalin] Shortness Of Breath  ?  Chest Pain   ? Bactrim [Sulfamethoxazole-Trimethoprim] Rash  ? Prednisone Palpitations and Hypertension  ?   ?  ? ? ? ? ?Family History  ?Problem Relation Age of Onset  ? Diabetes Mother   ? Stroke Mother   ? Seizures Other   ? Cancer Other   ? Diabetes Other   ? ? ? ?Social History ?Tanya Lowe reports that she has been smoking cigarettes. She started smoking about 31 years ago. She has a 24.00 pack-year smoking history. She has never used smokeless tobacco. ?Tanya Lowe reports no history of alcohol use. ? ? ?Review of Systems ?CONSTITUTIONAL: No weight loss, fever, chills, weakness or fatigue.  ?HEENT: Eyes: No visual loss, blurred vision, double vision or yellow sclerae.No hearing loss, sneezing, congestion, runny nose or sore throat.  ?SKIN: No rash or itching.  ?CARDIOVASCULAR: per hpi ?RESPIRATORY: per hpi.  ?GASTROINTESTINAL: No anorexia, nausea, vomiting or diarrhea. No abdominal pain or blood.  ?GENITOURINARY: No burning on urination, no polyuria ?NEUROLOGICAL: No headache, dizziness, syncope, paralysis, ataxia, numbness or tingling in the extremities. No change in bowel or bladder control.  ?MUSCULOSKELETAL: No muscle, back pain, joint pain or stiffness.  ?LYMPHATICS: No enlarged nodes. No  history of splenectomy.  ?PSYCHIATRIC: No history of depression or anxiety.  ?ENDOCRINOLOGIC: No reports of sweating, cold or heat intolerance. No polyuria or polydipsia.  ?. ? ? ?Physical Examination ?Today's Vitals  ? 02/19/22 1036  ?BP: 114/72  ?Pulse: 84  ?SpO2: 95%  ?Weight: 177 lb (80.3 kg)  ?Height: 5\' 2"  (1.575 m)  ? ?Body mass index is 32.37 kg/m?. ? ?Gen: resting comfortably, no acute distress ?HEENT: no scleral icterus, pupils equal round and reactive, no palptable cervical adenopathy,  ?CV: RRR, no m/r/g no  jvd ?Resp: Clear to auscultation bilaterally ?GI: abdomen is soft, non-tender, non-distended, normal bowel sounds, no hepatosplenomegaly ?MSK: extremities are warm, no edema.  ?Skin: warm, no rash ?Neuro:  no focal deficits ?Psych: appropriate affect ? ? ?Diagnostic Studies ? ?Jan 2016 Cath ?PCI Note: She was given an additional 5000 units IV heparin x 1. ACT was over 200. She was given Brilinta 180 mg po x 1. ?  ?Lesion # 1 Distal RCA: JR4 guiding catheter used to engage the RCA. [place] IC wire down the RCA. 2.0 x 15 mm balloon x 1 for pre-dilatation. 2.25 x 16 mm Promus Premier DES x 1 distal RCA post-dilated with a 2.5 x 12 mm [place] balloon x 1. Stenosis taken from 90% down to 0%. ?  ?Lesion # 2 mid RCA: 2.0 x 15 mm balloon x 1. 3.0 x 12 mm Promus Premier DES x 1. The mid stenosis was followed by an aneurysmal segment with a size mismatch before the distal vessel so stents were not overlapped. Post-dilatation with a 3.5 x 8 mm [place] balloon x 1. Stenosis taken from 95% down to 0%. ?  ?Lesion #3 mid LAD: XB LAD 3.5 guiding catheter to engage left main. 2.5 x 12 mm balloon x 1 for pre-dilatation. 3.5 x 16 mm Promus Premier DES x 1 mid LAD. Stent post-dilated with a 3.5 x 8 mm [place] balloon x 2. Stenosis taken from 80% down to 0%. ?  ?The sheath was removed from the right radial artery and a Terumo hemostasis band was applied at the arteriotomy site on the right wrist. There were no immediate complications.  The patient was taken to the recovery area in stable condition. ?  ?Hemodynamic Findings: ?Central aortic pressure: 115/66 ?Left ventricular pressure: 121/3/14 ?  ?Angiographic Findings: ?  ?Left main: No obstruc

## 2022-03-20 ENCOUNTER — Encounter: Payer: Self-pay | Admitting: Internal Medicine

## 2022-03-25 ENCOUNTER — Ambulatory Visit (HOSPITAL_COMMUNITY): Admission: RE | Admit: 2022-03-25 | Payer: Medicare Other | Source: Ambulatory Visit

## 2022-04-17 ENCOUNTER — Ambulatory Visit (HOSPITAL_COMMUNITY): Payer: Medicare Other

## 2022-04-17 ENCOUNTER — Ambulatory Visit: Payer: Medicare Other | Admitting: Gastroenterology

## 2022-04-17 ENCOUNTER — Encounter (HOSPITAL_COMMUNITY): Payer: Self-pay

## 2022-05-05 ENCOUNTER — Encounter: Payer: Self-pay | Admitting: *Deleted

## 2022-05-05 NOTE — Progress Notes (Deleted)
PATIENT: Tanya Lowe DOB: [birth year]  REASON FOR VISIT: follow up HISTORY FROM: patient PRIMARY NEUROLOGIST: Dr. Frances Furbish  Virtual Visit via Video Note  I connected with Tanya Lowe on 05/05/22 at  1:00 PM EDT by a video enabled telemedicine application located remotely at [facility] and verified that I am speaking with the correct person using two identifiers who was located at their own home.   I discussed the limitations of evaluation and management by telemedicine and the availability of in person appointments. The patient expressed understanding and agreed to proceed.   PATIENT: Tanya Lowe DOB: 1971-05-22  REASON FOR VISIT: follow up HISTORY FROM: patient  HISTORY OF PRESENT ILLNESS: Today 05/05/22:  05/07/21: [name] is a 51 year old female with a history of obstructive sleep apnea on CPAP she returns today for follow-up.  She reports that the CPAP is working well for her.  She denies any new issues.  She returns today for evaluation.     REVIEW OF SYSTEMS: Out of a complete 14 system review of symptoms, the patient complains only of the following symptoms, and all other reviewed systems are negative.  See HPI  ALLERGIES: Allergies  Allergen Reactions   Lyrica [Pregabalin] Shortness Of Breath    Chest Pain    Bactrim [Sulfamethoxazole-Trimethoprim] Rash   Prednisone Palpitations and Hypertension         HOME MEDICATIONS: Outpatient Medications Prior to Visit  Medication Sig Dispense Refill   acetaminophen (TYLENOL) 500 MG tablet Take 1,000 mg by mouth every 6 (six) hours as needed for mild pain.      albuterol (PROVENTIL HFA;VENTOLIN HFA) 108 (90 Base) MCG/ACT inhaler Inhale 1-2 puffs into the lungs every 6 (six) hours as needed for wheezing or shortness of breath.     aspirin EC 81 MG tablet Take 81 mg by mouth daily.     atorvastatin (LIPITOR) 80 MG tablet Take 1 tablet (80 mg total) by mouth daily at 6 PM. 30 tablet 11   busPIRone  (BUSPAR) 7.5 MG tablet Take 2 tablets by mouth 2 (two) times daily.     Cetirizine HCl (ZYRTEC PO) Take by mouth.     DULoxetine (CYMBALTA) 60 MG capsule Take 90 mg by mouth daily.     fexofenadine (ALLEGRA) 180 MG tablet 1 tablet Swallow whole with water; do not take with fruit juices.     fluticasone (FLONASE) 50 MCG/ACT nasal spray Place 2 sprays into both nostrils daily as needed for allergies.      gabapentin (NEURONTIN) 600 MG tablet Take 1,200 mg by mouth 3 (three) times daily.     lisinopril (PRINIVIL,ZESTRIL) 2.5 MG tablet Take 1 tablet (2.5 mg total) by mouth daily. 90 tablet 3   metFORMIN (GLUCOPHAGE) 500 MG tablet Take 500 mg by mouth daily with breakfast.     metoprolol tartrate (LOPRESSOR) 25 MG tablet Take 1 tablet (25 mg total) by mouth 2 (two) times daily. 180 tablet 3   naproxen (NAPROSYN) 500 MG tablet Take 500 mg by mouth 2 (two) times daily. (Patient not taking: Reported on 02/19/2022)     nitroGLYCERIN (NITROSTAT) 0.4 MG SL tablet Place 1 tablet (0.4 mg total) under the tongue every 5 (five) minutes as needed for chest pain. 25 tablet 3   oxyCODONE-acetaminophen (PERCOCET) 10-325 MG per tablet Take 1 tablet by mouth 2 (two) times daily.     pantoprazole (PROTONIX) 40 MG tablet Take 40 mg by mouth 2 (two) times daily.  tiZANidine (ZANAFLEX) 4 MG tablet Take 4 mg by mouth 3 (three) times daily.     No facility-administered medications prior to visit.    PAST MEDICAL HISTORY: Past Medical History:  Diagnosis Date   Arthritis    Bilateral hand pain    CAD (coronary artery disease)    a. s/p NSTEMI in 2016 with DES to mid-RCA, DES to [facility] and DES to mid-LAD b. low-risk NST in 05/2016 and 11/2018   Cervical radiculopathy    Chronic neck and back pain    Emphysema lung (HCC)    Fibromyalgia    Hypertension    Kidney stones    hx of w previous lithotripsy   MI (myocardial infarction) (HCC)    Pain management    [facility]   Thoracic back pain     PAST SURGICAL  HISTORY: Past Surgical History:  Procedure Laterality Date   CARDIAC SURGERY     CYSTOSCOPY WITH RETROGRADE PYELOGRAM, URETEROSCOPY AND STENT PLACEMENT Left 03/14/2015   Procedure: CYSTOSCOPY WITH LEFT RETROGRADE PYELOGRAM;  Surgeon: Alfredo Martinez, MD;  Location: [facility];  Service: Urology;  Laterality: Left;   CYSTOSCOPY WITH RETROGRADE PYELOGRAM, URETEROSCOPY AND STENT PLACEMENT Left 03/20/2015   Procedure: CYSTOSCOPY WITH LEFT  RETROGRADE LEFT URETEROSCOPY AND LEFT  STENT ;  Surgeon: Bjorn Pippin, MD;  Location: [facility];  Service: Urology;  Laterality: Left;   HOLMIUM LASER APPLICATION Left 03/20/2015   Procedure: HOLMIUM LASER APPLICATION;  Surgeon: Bjorn Pippin, MD;  Location: [facility];  Service: Urology;  Laterality: Left;   LEFT HEART CATHETERIZATION WITH CORONARY ANGIOGRAM N/A 10/25/2014   Procedure: LEFT HEART CATHETERIZATION WITH CORONARY ANGIOGRAM;  Surgeon: Kathleene Hazel, MD;  Location: [facility] CATH LAB;  Service: Cardiovascular;  Laterality: N/A;   LITHOTRIPSY  about 2011   SPINAL FUSION     L5-S1   TUBAL LIGATION      FAMILY HISTORY: Family History  Problem Relation Age of Onset   Diabetes Mother    Stroke Mother    Seizures Other    Cancer Other    Diabetes Other     SOCIAL HISTORY: Social History   Socioeconomic History   Marital status: Divorced    Spouse name: Not on file   Number of children: Not on file   Years of education: Not on file   Highest education level: Not on file  Occupational History   Not on file  Tobacco Use   Smoking status: Every Day    Packs/day: 1.00    Years: 24.00    Total pack years: 24.00    Types: Cigarettes    Start date: 10/25/1990   Smokeless tobacco: Never  Vaping Use   Vaping Use: Never used  Substance and Sexual Activity   Alcohol use: No    Alcohol/week: 0.0 standard drinks of alcohol   Drug use: No   Sexual activity: Not on file  Other Topics Concern   Not on file  Social History Narrative   Not on file   Social  Determinants of Health   Financial Resource Strain: Not on file  Food Insecurity: Not on file  Transportation Needs: Not on file  Physical Activity: Not on file  Stress: Not on file  Social Connections: Not on file  Intimate Partner Violence: Not on file      PHYSICAL EXAM Generalized: Well developed, in no acute distress   Neurological examination  Mentation: Alert oriented to time, place, history taking. Follows all commands speech and language fluent Cranial  nerve II-XII:Extraocular movements were full. Facial symmetry noted. uvula tongue midline. Head turning and shoulder shrug  were normal and symmetric. Motor: Good strength throughout subjectively per patient Sensory: Sensory testing is intact to soft touch on all 4 extremities subjectively per patient Coordination: Cerebellar testing reveals good finger-nose-finger  Gait and station: Patient is able to stand from a seated position. gait is normal.  Reflexes: UTA  DIAGNOSTIC DATA (LABS, IMAGING, TESTING) - I reviewed patient records, labs, notes, testing and imaging myself where available.  Lab Results  Component Value Date   WBC 8.5 07/30/2015   HGB 13.4 07/30/2015   HCT 40.7 07/30/2015   MCV 86.4 07/30/2015   PLT 254 07/30/2015      Component Value Date/Time   NA 140 07/30/2015 1218   K 3.9 07/30/2015 1218   CL 106 07/30/2015 1218   CO2 27 07/30/2015 1218   GLUCOSE 132 (H) 07/30/2015 1218   BUN 9 07/30/2015 1218   CREATININE 0.75 07/30/2015 1218   CALCIUM 8.6 (L) 07/30/2015 1218   PROT 7.2 03/14/2015 0020   ALBUMIN 3.3 (L) 03/14/2015 0020   AST 28 03/14/2015 0020   ALT 22 03/14/2015 0020   ALKPHOS 95 03/14/2015 0020   BILITOT 0.4 03/14/2015 0020   GFRNONAA >60 07/30/2015 1218   GFRAA >60 07/30/2015 1218   Lab Results  Component Value Date   CHOL 121 10/25/2014   HDL 21 (L) 10/25/2014   LDLCALC 50 10/25/2014   TRIG 251 (H) 10/25/2014   CHOLHDL 5.8 10/25/2014   Lab Results  Component Value Date    HGBA1C 5.9 (H) 10/24/2014   No results found for: "VITAMINB12" Lab Results  Component Value Date   TSH 2.092 10/24/2014      ASSESSMENT AND PLAN 51 y.o. year old female  has a past medical history of Arthritis, Bilateral hand pain, CAD (coronary artery disease), Cervical radiculopathy, Chronic neck and back pain, Emphysema lung (HCC), Fibromyalgia, Hypertension, Kidney stones, MI (myocardial infarction) (HCC), Pain management, and Thoracic back pain. here with:  OSA on CPAP  CPAP compliance excellent Residual AHI is good Encouraged patient to continue using CPAP nightly and > 4 hours each night F/U in 1 year or sooner if needed   Butch Penny, MSN, NP-C 05/05/2022, 9:30 AM [facility] [address], [address] [place], [place] [address] [phone]

## 2022-05-06 ENCOUNTER — Telehealth: Payer: Medicare Other | Admitting: Adult Health

## 2022-06-19 ENCOUNTER — Ambulatory Visit (HOSPITAL_COMMUNITY)
Admission: RE | Admit: 2022-06-19 | Discharge: 2022-06-19 | Disposition: A | Discharge status: Home / Self Care | Payer: Medicare Other | Source: Ambulatory Visit | Attending: Family Medicine | Admitting: Family Medicine

## 2022-06-19 DIAGNOSIS — R591 Generalized enlarged lymph nodes: Secondary | ICD-10-CM | POA: Insufficient documentation

## 2022-06-19 MED ORDER — IOHEXOL 300 MG/ML  SOLN
75.0000 mL | Freq: Once | INTRAMUSCULAR | Status: AC | PRN
  Administered 2022-06-19: 75 mL via INTRAVENOUS

## 2022-06-20 ENCOUNTER — Telehealth (INDEPENDENT_AMBULATORY_CARE_PROVIDER_SITE_OTHER): Payer: Medicare Other | Admitting: Adult Health

## 2022-06-20 DIAGNOSIS — G4733 Obstructive sleep apnea (adult) (pediatric): Secondary | ICD-10-CM | POA: Diagnosis not present

## 2022-06-20 DIAGNOSIS — Z9989 Dependence on other enabling machines and devices: Secondary | ICD-10-CM | POA: Diagnosis not present

## 2022-06-20 NOTE — Progress Notes (Signed)
PATIENT: Tanya Lowe DOB: Sep 10, 1971  REASON FOR VISIT: follow up HISTORY FROM: patient PRIMARY NEUROLOGIST:   Virtual Visit via Video Note  I connected with Tanya Lowe on 06/20/22 at 10:00 AM EDT by a video enabled telemedicine application located remotely at [facility] and verified that I am speaking with the correct person using two identifiers who was located at their own home.   I discussed the limitations of evaluation and management by telemedicine and the availability of in person appointments. The patient expressed understanding and agreed to proceed.   PATIENT: Tanya Lowe DOB: [birth year]  REASON FOR VISIT: follow up HISTORY FROM: patient  HISTORY OF PRESENT ILLNESS: Today 06/20/22:  Ms. Tanya Lowe is a 51 year old female with a history of obstructive sleep apnea on CPAP.  She returns today for follow-up.  Her report indicates that she used her machine 28 out of 30 days for compliance of 98%.  She used her machine greater than 4 hours 20 out of 30 days for compliance of 67%.  On average she uses her machine 5 hours and 20 minutes.  Her residual AHI is 1.4 on 9 cm of water with EPR 2.  Leak in the 95th percentile is 46 L/min.  Reports that the leak is bothering her.  She is having to sleep in the recliner.  She also does not like the heated tubing feature.  She continues to notice the benefit.   She returns today for an evaluation.  05/07/21: Ms. Tanya Lowe is a 51 year old female with a history of obstructive sleep apnea on CPAP she returns today for follow-up.  She reports that the CPAP is working well for her.  She denies any new issues.  She returns today for evaluation.     REVIEW OF SYSTEMS: Out of a complete 14 system review of symptoms, the patient complains only of the following symptoms, and all other reviewed systems are negative.  See HPI  ALLERGIES: Allergies  Allergen Reactions   Lyrica [Pregabalin] Shortness Of Breath    Chest Pain     Bactrim [Sulfamethoxazole-Trimethoprim] Rash   Prednisone Palpitations and Hypertension         HOME MEDICATIONS: Outpatient Medications Prior to Visit  Medication Sig Dispense Refill   acetaminophen (TYLENOL) 500 MG tablet Take 1,000 mg by mouth every 6 (six) hours as needed for mild pain.      albuterol (PROVENTIL HFA;VENTOLIN HFA) 108 (90 Base) MCG/ACT inhaler Inhale 1-2 puffs into the lungs every 6 (six) hours as needed for wheezing or shortness of breath.     aspirin EC 81 MG tablet Take 81 mg by mouth daily.     atorvastatin (LIPITOR) 80 MG tablet Take 1 tablet (80 mg total) by mouth daily at 6 PM. 30 tablet 11   busPIRone (BUSPAR) 7.5 MG tablet Take 2 tablets by mouth 2 (two) times daily.     Cetirizine HCl (ZYRTEC PO) Take by mouth.     DULoxetine (CYMBALTA) 60 MG capsule Take 90 mg by mouth daily.     fexofenadine (ALLEGRA) 180 MG tablet 1 tablet Swallow whole with water; do not take with fruit juices.     fluticasone (FLONASE) 50 MCG/ACT nasal spray Place 2 sprays into both nostrils daily as needed for allergies.      gabapentin (NEURONTIN) 600 MG tablet Take 1,200 mg by mouth 3 (three) times daily.     lisinopril (PRINIVIL,ZESTRIL) 2.5 MG tablet Take 1 tablet (2.5 mg total) by mouth  daily. 90 tablet 3   metFORMIN (GLUCOPHAGE) 500 MG tablet Take 500 mg by mouth daily with breakfast.     metoprolol tartrate (LOPRESSOR) 25 MG tablet Take 1 tablet (25 mg total) by mouth 2 (two) times daily. 180 tablet 3   naproxen (NAPROSYN) 500 MG tablet Take 500 mg by mouth 2 (two) times daily. (Patient not taking: Reported on 02/19/2022)     nitroGLYCERIN (NITROSTAT) 0.4 MG SL tablet Place 1 tablet (0.4 mg total) under the tongue every 5 (five) minutes as needed for chest pain. 25 tablet 3   oxyCODONE-acetaminophen (PERCOCET) 10-325 MG per tablet Take 1 tablet by mouth 2 (two) times daily.     pantoprazole (PROTONIX) 40 MG tablet Take 40 mg by mouth 2 (two) times daily.     tiZANidine (ZANAFLEX) 4  MG tablet Take 4 mg by mouth 3 (three) times daily.     No facility-administered medications prior to visit.    PAST MEDICAL HISTORY: Past Medical History:  Diagnosis Date   Arthritis    Bilateral hand pain    CAD (coronary artery disease)    a. s/p NSTEMI in 2016 with DES to mid-RCA, DES to [facility] and DES to mid-LAD b. low-risk NST in 05/2016 and 11/2018   Cervical radiculopathy    Chronic neck and back pain    Emphysema lung (HCC)    Fibromyalgia    Hypertension    Kidney stones    hx of w previous lithotripsy   MI (myocardial infarction) (HCC)    Pain management    [facility]   Thoracic back pain     PAST SURGICAL HISTORY: Past Surgical History:  Procedure Laterality Date   CARDIAC SURGERY     CYSTOSCOPY WITH RETROGRADE PYELOGRAM, URETEROSCOPY AND STENT PLACEMENT Left 03/14/2015   Procedure: CYSTOSCOPY WITH LEFT RETROGRADE PYELOGRAM;  Surgeon: Alfredo Martinez, MD;  Location: [facility];  Service: Urology;  Laterality: Left;   CYSTOSCOPY WITH RETROGRADE PYELOGRAM, URETEROSCOPY AND STENT PLACEMENT Left 03/20/2015   Procedure: CYSTOSCOPY WITH LEFT  RETROGRADE LEFT URETEROSCOPY AND LEFT  STENT ;  Surgeon: Bjorn Pippin, MD;  Location: [facility];  Service: Urology;  Laterality: Left;   HOLMIUM LASER APPLICATION Left 03/20/2015   Procedure: HOLMIUM LASER APPLICATION;  Surgeon: Bjorn Pippin, MD;  Location: [facility];  Service: Urology;  Laterality: Left;   LEFT HEART CATHETERIZATION WITH CORONARY ANGIOGRAM N/A 10/25/2014   Procedure: LEFT HEART CATHETERIZATION WITH CORONARY ANGIOGRAM;  Surgeon: Kathleene Hazel, MD;  Location: [facility] CATH LAB;  Service: Cardiovascular;  Laterality: N/A;   LITHOTRIPSY  about 2011   SPINAL FUSION     L5-S1   TUBAL LIGATION      FAMILY HISTORY: Family History  Problem Relation Age of Onset   Diabetes Mother    Stroke Mother    Seizures Other    Cancer Other    Diabetes Other     SOCIAL HISTORY: Social History   Socioeconomic History   Marital status:  Divorced    Spouse name: Not on file   Number of children: Not on file   Years of education: Not on file   Highest education level: Not on file  Occupational History   Not on file  Tobacco Use   Smoking status: Every Day    Packs/day: 1.00    Years: 24.00    Total pack years: 24.00    Types: Cigarettes    Start date: 10/25/1990   Smokeless tobacco: Never  Vaping Use   Vaping Use:  Never used  Substance and Sexual Activity   Alcohol use: No    Alcohol/week: 0.0 standard drinks of alcohol   Drug use: No   Sexual activity: Not on file  Other Topics Concern   Not on file  Social History Narrative   Not on file   Social Determinants of Health   Financial Resource Strain: Not on file  Food Insecurity: Not on file  Transportation Needs: Not on file  Physical Activity: Not on file  Stress: Not on file  Social Connections: Not on file  Intimate Partner Violence: Not on file      PHYSICAL EXAM Generalized: Well developed, in no acute distress   Neurological examination  Mentation: Alert oriented to time, place, history taking. Follows all commands speech and language fluent Cranial nerve II-XII:Extraocular movements were full. Facial symmetry noted. Marland Kitchen Head turning and shoulder shrug  were normal and symmetric.  DIAGNOSTIC DATA (LABS, IMAGING, TESTING) - I reviewed patient records, labs, notes, testing and imaging myself where available.  Lab Results  Component Value Date   WBC 8.5 07/30/2015   HGB 13.4 07/30/2015   HCT 40.7 07/30/2015   MCV 86.4 07/30/2015   PLT 254 07/30/2015      Component Value Date/Time   NA 140 07/30/2015 1218   K 3.9 07/30/2015 1218   CL 106 07/30/2015 1218   CO2 27 07/30/2015 1218   GLUCOSE 132 (H) 07/30/2015 1218   BUN 9 07/30/2015 1218   CREATININE 0.75 07/30/2015 1218   CALCIUM 8.6 (L) 07/30/2015 1218   PROT 7.2 03/14/2015 0020   ALBUMIN 3.3 (L) 03/14/2015 0020   AST 28 03/14/2015 0020   ALT 22 03/14/2015 0020   ALKPHOS 95  03/14/2015 0020   BILITOT 0.4 03/14/2015 0020   GFRNONAA >60 07/30/2015 1218   GFRAA >60 07/30/2015 1218   Lab Results  Component Value Date   CHOL 121 10/25/2014   HDL 21 (L) 10/25/2014   LDLCALC 50 10/25/2014   TRIG 251 (H) 10/25/2014   CHOLHDL 5.8 10/25/2014   Lab Results  Component Value Date   HGBA1C 5.9 (H) 10/24/2014   No results found for: "VITAMINB12" Lab Results  Component Value Date   TSH 2.092 10/24/2014      ASSESSMENT AND PLAN 51 y.o. year old female  has a past medical history of Arthritis, Bilateral hand pain, CAD (coronary artery disease), Cervical radiculopathy, Chronic neck and back pain, Emphysema lung (HCC), Fibromyalgia, Hypertension, Kidney stones, MI (myocardial infarction) (HCC), Pain management, and Thoracic back pain. here with:  OSA on CPAP  CPAP compliance excellent Residual AHI is good Encouraged patient to continue using CPAP nightly and > 4 hours each night F/U in 1 year or sooner if needed   Butch Penny, MSN, NP-C 06/20/2022, 9:56 AM [facility] [address], [address] [place], [place] [address] [phone]

## 2022-06-24 NOTE — Progress Notes (Signed)
Received fax confirmation [facility] [phone] for new orders cpap. (Mask refit, and turn heated tubing off).

## 2022-06-26 LAB — POCT I-STAT CREATININE: Creatinine, Ser: 0.6 mg/dL (ref 0.44–1.00)

## 2022-07-10 ENCOUNTER — Other Ambulatory Visit: Payer: Self-pay

## 2022-07-10 ENCOUNTER — Inpatient Hospital Stay (HOSPITAL_COMMUNITY)
Admission: EM | Admit: 2022-07-10 | Discharge: 2022-07-14 | DRG: 291 | Disposition: A | Discharge status: Home / Self Care | Payer: Medicare Other | Attending: Internal Medicine | Admitting: Internal Medicine

## 2022-07-10 ENCOUNTER — Emergency Department (HOSPITAL_COMMUNITY): Payer: Medicare Other

## 2022-07-10 DIAGNOSIS — J9601 Acute respiratory failure with hypoxia: Secondary | ICD-10-CM | POA: Diagnosis present

## 2022-07-10 DIAGNOSIS — J439 Emphysema, unspecified: Secondary | ICD-10-CM | POA: Diagnosis present

## 2022-07-10 DIAGNOSIS — I252 Old myocardial infarction: Secondary | ICD-10-CM | POA: Diagnosis not present

## 2022-07-10 DIAGNOSIS — G8929 Other chronic pain: Secondary | ICD-10-CM | POA: Diagnosis present

## 2022-07-10 DIAGNOSIS — J449 Chronic obstructive pulmonary disease, unspecified: Secondary | ICD-10-CM

## 2022-07-10 DIAGNOSIS — Z8701 Personal history of pneumonia (recurrent): Secondary | ICD-10-CM

## 2022-07-10 DIAGNOSIS — Z7984 Long term (current) use of oral hypoglycemic drugs: Secondary | ICD-10-CM

## 2022-07-10 DIAGNOSIS — E119 Type 2 diabetes mellitus without complications: Secondary | ICD-10-CM

## 2022-07-10 DIAGNOSIS — J189 Pneumonia, unspecified organism: Secondary | ICD-10-CM | POA: Diagnosis present

## 2022-07-10 DIAGNOSIS — R590 Localized enlarged lymph nodes: Secondary | ICD-10-CM

## 2022-07-10 DIAGNOSIS — I110 Hypertensive heart disease with heart failure: Secondary | ICD-10-CM | POA: Diagnosis present

## 2022-07-10 DIAGNOSIS — Z888 Allergy status to other drugs, medicaments and biological substances status: Secondary | ICD-10-CM | POA: Diagnosis not present

## 2022-07-10 DIAGNOSIS — R7989 Other specified abnormal findings of blood chemistry: Secondary | ICD-10-CM

## 2022-07-10 DIAGNOSIS — Z981 Arthrodesis status: Secondary | ICD-10-CM

## 2022-07-10 DIAGNOSIS — E8809 Other disorders of plasma-protein metabolism, not elsewhere classified: Secondary | ICD-10-CM

## 2022-07-10 DIAGNOSIS — E876 Hypokalemia: Secondary | ICD-10-CM

## 2022-07-10 DIAGNOSIS — K219 Gastro-esophageal reflux disease without esophagitis: Secondary | ICD-10-CM | POA: Diagnosis present

## 2022-07-10 DIAGNOSIS — Z720 Tobacco use: Secondary | ICD-10-CM | POA: Diagnosis present

## 2022-07-10 DIAGNOSIS — D509 Iron deficiency anemia, unspecified: Secondary | ICD-10-CM | POA: Diagnosis present

## 2022-07-10 DIAGNOSIS — Z7982 Long term (current) use of aspirin: Secondary | ICD-10-CM

## 2022-07-10 DIAGNOSIS — M797 Fibromyalgia: Secondary | ICD-10-CM | POA: Diagnosis present

## 2022-07-10 DIAGNOSIS — I5031 Acute diastolic (congestive) heart failure: Secondary | ICD-10-CM | POA: Diagnosis present

## 2022-07-10 DIAGNOSIS — R079 Chest pain, unspecified: Secondary | ICD-10-CM | POA: Diagnosis not present

## 2022-07-10 DIAGNOSIS — F17210 Nicotine dependence, cigarettes, uncomplicated: Secondary | ICD-10-CM | POA: Diagnosis present

## 2022-07-10 DIAGNOSIS — Z833 Family history of diabetes mellitus: Secondary | ICD-10-CM

## 2022-07-10 DIAGNOSIS — I2510 Atherosclerotic heart disease of native coronary artery without angina pectoris: Secondary | ICD-10-CM | POA: Diagnosis present

## 2022-07-10 DIAGNOSIS — Z955 Presence of coronary angioplasty implant and graft: Secondary | ICD-10-CM

## 2022-07-10 DIAGNOSIS — I509 Heart failure, unspecified: Secondary | ICD-10-CM

## 2022-07-10 DIAGNOSIS — E669 Obesity, unspecified: Secondary | ICD-10-CM | POA: Diagnosis present

## 2022-07-10 DIAGNOSIS — Z6831 Body mass index (BMI) 31.0-31.9, adult: Secondary | ICD-10-CM

## 2022-07-10 DIAGNOSIS — I10 Essential (primary) hypertension: Secondary | ICD-10-CM | POA: Diagnosis present

## 2022-07-10 DIAGNOSIS — M549 Dorsalgia, unspecified: Secondary | ICD-10-CM | POA: Diagnosis present

## 2022-07-10 DIAGNOSIS — Z882 Allergy status to sulfonamides status: Secondary | ICD-10-CM

## 2022-07-10 DIAGNOSIS — R591 Generalized enlarged lymph nodes: Secondary | ICD-10-CM

## 2022-07-10 DIAGNOSIS — I5021 Acute systolic (congestive) heart failure: Secondary | ICD-10-CM | POA: Diagnosis not present

## 2022-07-10 DIAGNOSIS — Z79899 Other long term (current) drug therapy: Secondary | ICD-10-CM | POA: Diagnosis not present

## 2022-07-10 DIAGNOSIS — Z20822 Contact with and (suspected) exposure to covid-19: Secondary | ICD-10-CM | POA: Diagnosis present

## 2022-07-10 DIAGNOSIS — I50811 Acute right heart failure: Secondary | ICD-10-CM | POA: Diagnosis not present

## 2022-07-10 DIAGNOSIS — I5033 Acute on chronic diastolic (congestive) heart failure: Secondary | ICD-10-CM | POA: Diagnosis not present

## 2022-07-10 LAB — CBC
HCT: 33.2 % — ABNORMAL LOW (ref 36.0–46.0)
Hemoglobin: 9.5 g/dL — ABNORMAL LOW (ref 12.0–15.0)
MCH: 22.8 pg — ABNORMAL LOW (ref 26.0–34.0)
MCHC: 28.6 g/dL — ABNORMAL LOW (ref 30.0–36.0)
MCV: 79.8 fL — ABNORMAL LOW (ref 80.0–100.0)
Platelets: 302 10*3/uL (ref 150–400)
RBC: 4.16 MIL/uL (ref 3.87–5.11)
RDW: 20.4 % — ABNORMAL HIGH (ref 11.5–15.5)
WBC: 8.9 10*3/uL (ref 4.0–10.5)
nRBC: 0 % (ref 0.0–0.2)

## 2022-07-10 LAB — BASIC METABOLIC PANEL
Anion gap: 10 (ref 5–15)
BUN: 9 mg/dL (ref 6–20)
CO2: 27 mmol/L (ref 22–32)
Calcium: 8.8 mg/dL — ABNORMAL LOW (ref 8.9–10.3)
Chloride: 98 mmol/L (ref 98–111)
Creatinine, Ser: 0.9 mg/dL (ref 0.44–1.00)
GFR, Estimated: >60 mL/min (ref >60)
Glucose, Bld: 99 mg/dL (ref 70–99)
Potassium: 4.4 mmol/L (ref 3.5–5.1)
Sodium: 135 mmol/L (ref 135–145)

## 2022-07-10 LAB — TROPONIN I (HIGH SENSITIVITY)
Troponin I (High Sensitivity): 6 ng/L (ref <18)
Troponin I (High Sensitivity): 6 ng/L (ref <18)

## 2022-07-10 LAB — SARS CORONAVIRUS 2 BY RT PCR: SARS Coronavirus 2 by RT PCR: NEGATIVE

## 2022-07-10 LAB — BRAIN NATRIURETIC PEPTIDE: B Natriuretic Peptide: 862 pg/mL — ABNORMAL HIGH (ref 0.0–100.0)

## 2022-07-10 MED ORDER — NITROGLYCERIN 2 % TD OINT
0.5000 [in_us] | TOPICAL_OINTMENT | Freq: Once | TRANSDERMAL | Status: DC
  Filled 2022-07-10: qty 1

## 2022-07-10 MED ORDER — FUROSEMIDE 10 MG/ML IJ SOLN
40.0000 mg | Freq: Once | INTRAMUSCULAR | Status: AC
  Administered 2022-07-10: 40 mg via INTRAVENOUS
  Filled 2022-07-10: qty 4

## 2022-07-10 NOTE — ED Triage Notes (Signed)
Pt arrived from EMS from Pulmonary MD Office, pt complained of pressure in center of chest rated 6/10 at that time, EMS gave 324mg  Aspirin and 1 dose of nitro, pt states pain is a 2/10 now. Pt denies any radiating pain. Pt has 3 cardiac stent and newly diagnosis of COPD.

## 2022-07-10 NOTE — ED Provider Notes (Signed)
[facility] EMERGENCY DEPARTMENT Provider Note   CSN: [id number] Arrival date & time: 07/10/22  1651     History  Chief Complaint  Patient presents with   Chest Pain    [name] is a 51 y.o. female with a history of for MI in 2016 with stents x3, hypertension, COPD, reports she was admitted to the hospital in [place] about 2 weeks ago where she was treated for pneumonia, discharged home but has not felt well since that admission.  She was seen by her PCP today in follow-up care and her oxygen saturation was found to be in the mid 80s and was sent here for further evaluation.  She does report chronic mid chest pressure which has been present intermittently over the past several days.  She was given a dose of aspirin and 1 nitro glycerin tablet per EMS prior to arrival and her pain has improved.    She states she was just diagnosed with COPD at her recent hospitalization.  She continues to smoke.  The history is provided by the patient.       Home Medications Prior to Admission medications   Medication Sig Start Date End Date Taking? Authorizing Provider  acetaminophen (TYLENOL) 500 MG tablet Take 1,000 mg by mouth every 6 (six) hours as needed for mild pain.    Yes [provider]  albuterol (PROVENTIL HFA;VENTOLIN HFA) 108 (90 Base) MCG/ACT inhaler Inhale 1-2 puffs into the lungs every 6 (six) hours as needed for wheezing or shortness of breath.   Yes [provider]  aspirin EC 81 MG tablet Take 81 mg by mouth daily.   Yes [provider]  atorvastatin (LIPITOR) 80 MG tablet Take 1 tablet (80 mg total) by mouth daily at 6 PM. 10/26/14  Yes Hager, Kelle Darting, PA-C  busPIRone (BUSPAR) 7.5 MG tablet Take 15 mg by mouth 3 (three) times daily.   Yes [provider]  DULoxetine (CYMBALTA) 30 MG capsule Take 30 mg by mouth daily. Take with 60 mg to equal 90 mg 07/07/22  Yes [provider]  DULoxetine (CYMBALTA) 60 MG capsule Take 60 mg by mouth  daily. Take with 30 mg to equal 90 mg   Yes [provider]  FEROSUL 325 (65 Fe) MG tablet Take 325 mg by mouth every other day. 06/30/22  Yes [provider]  fexofenadine (ALLEGRA) 180 MG tablet Take 180 mg by mouth daily.   Yes [provider]  fluticasone (FLONASE) 50 MCG/ACT nasal spray Place 2 sprays into both nostrils daily as needed for allergies.  07/28/17  Yes [provider]  gabapentin (NEURONTIN) 600 MG tablet Take 1,200 mg by mouth 3 (three) times daily.   Yes [provider]  ipratropium-albuterol (DUONEB) 0.5-2.5 (3) MG/3ML SOLN Take 3 mLs by nebulization every 6 (six) hours as needed (shortness of breath). 06/30/22  Yes [provider]  lisinopril (PRINIVIL,ZESTRIL) 2.5 MG tablet Take 1 tablet (2.5 mg total) by mouth daily. 05/11/15  Yes Antoine Poche, MD  metFORMIN (GLUCOPHAGE) 500 MG tablet Take 500 mg by mouth daily with breakfast. 04/30/20  Yes [provider]  metoprolol tartrate (LOPRESSOR) 25 MG tablet Take 1 tablet (25 mg total) by mouth 2 (two) times daily. 08/05/17  Yes Branch, Dorothe Pea, MD  nitroGLYCERIN (NITROSTAT) 0.4 MG SL tablet Place 1 tablet (0.4 mg total) under the tongue every 5 (five) minutes as needed for chest pain. 11/28/15  Yes Branch, Dorothe Pea, MD  omeprazole (  PRILOSEC) 20 MG capsule Take 20 mg by mouth daily. 03/09/22  Yes [provider]  oxyCODONE-acetaminophen (PERCOCET) 10-325 MG per tablet Take 1 tablet by mouth every 8 (eight) hours as needed for pain.   Yes [provider]  tiZANidine (ZANAFLEX) 4 MG tablet Take 4 mg by mouth 3 (three) times daily. 02/22/15  Yes [provider]  pantoprazole (PROTONIX) 40 MG tablet Take 40 mg by mouth 2 (two) times daily. Patient not taking: Reported on 07/10/2022 01/16/22   [provider]      Allergies    Lyrica [pregabalin], Bactrim [sulfamethoxazole-trimethoprim], and Prednisone    Review of Systems   Review of  Systems  Constitutional:  Negative for fever.  HENT: Negative.    Eyes: Negative.   Respiratory:  Positive for shortness of breath. Negative for chest tightness.   Cardiovascular:  Positive for chest pain. Negative for palpitations.  Gastrointestinal:  Negative for abdominal pain, nausea and vomiting.  Genitourinary: Negative.   Musculoskeletal:  Negative for arthralgias, joint swelling and neck pain.  Skin: Negative.  Negative for rash and wound.  Neurological:  Negative for dizziness, weakness, light-headedness, numbness and headaches.  Psychiatric/Behavioral: Negative.      Physical Exam Updated Vital Signs BP (!) 144/83   Pulse 71   Temp 98.3 F (36.8 C) (Oral)   Resp 16   Ht 5\' 2"  (1.575 m)   Wt 78.9 kg   SpO2 100%   BMI 31.83 kg/m  Physical Exam Vitals and nursing note reviewed.  Constitutional:      Appearance: She is well-developed.  HENT:     Head: Normocephalic and atraumatic.  Eyes:     Conjunctiva/sclera: Conjunctivae normal.  Cardiovascular:     Rate and Rhythm: Normal rate and regular rhythm.     Heart sounds: Normal heart sounds.  Pulmonary:     Effort: Pulmonary effort is normal.     Breath sounds: Decreased breath sounds and rales present. No wheezing.  Abdominal:     General: Bowel sounds are normal.     Palpations: Abdomen is soft.     Tenderness: There is no abdominal tenderness.  Musculoskeletal:        General: Normal range of motion.     Cervical back: Normal range of motion.     Right lower leg: Edema present.     Left lower leg: Edema present.     Comments: Trace bilateral ankle edema.  Skin:    General: Skin is warm and dry.  Neurological:     Mental Status: She is alert.     ED Results / Procedures / Treatments   Labs (all labs ordered are listed, but only abnormal results are displayed) Labs Reviewed  BASIC METABOLIC PANEL - Abnormal; Notable for the following components:      Result Value   Calcium 8.8 (*)    All other  components within normal limits  CBC - Abnormal; Notable for the following components:   Hemoglobin 9.5 (*)    HCT 33.2 (*)    MCV 79.8 (*)    MCH 22.8 (*)    MCHC 28.6 (*)    RDW 20.4 (*)    All other components within normal limits  BRAIN NATRIURETIC PEPTIDE - Abnormal; Notable for the following components:   B Natriuretic Peptide 862.0 (*)    All other components within normal limits  SARS CORONAVIRUS 2 BY RT PCR  TROPONIN I (HIGH SENSITIVITY)  TROPONIN I (HIGH SENSITIVITY)  EKG EKG Interpretation  Date/Time:  Thursday July 10 2022 17:00:32 EDT Ventricular Rate:  66 PR Interval:  140 QRS Duration: 90 QT Interval:  425 QTC Calculation: 446 R Axis:   94 Text Interpretation: Sinus rhythm Borderline right axis deviation Borderline repolarization abnormality No significant change since last tracing Confirmed by Isla Pence [phone]) on 07/10/2022 5:59:52 PM  Radiology DG Chest 2 View  Result Date: 07/10/2022 CLINICAL DATA:  Chest pain EXAM: CHEST - 2 VIEW COMPARISON:  07/30/2015 FINDINGS: Transverse diameter of heart is increased. Central pulmonary vessels are more prominent. Increased interstitial markings are seen in parahilar regions and lower lung fields. There is no focal pulmonary consolidation. There is no pleural effusion or pneumothorax. Pain control lead is seen in thoracic spinal canal. IMPRESSION: Central pulmonary vessels are more prominent. Increased interstitial markings are seen in parahilar regions and lower lung fields, more so on the right side. Findings suggest interstitial edema or interstitial pneumonia. There is no focal pulmonary consolidation. There is no pleural effusion. Electronically Signed   By: Elmer Picker M.D.   On: 07/10/2022 18:21    Procedures Procedures    Medications Ordered in ED Medications  furosemide (LASIX) injection 40 mg (40 mg Intravenous Given 07/10/22 2205)    ED Course/ Medical Decision Making/ A&P Clinical  Course as of 07/10/22 2253  Thu Jul 10, 2022  2216 Chest pain currently resolved, so ntg paste cancelled.  IV lasix ordered to address chf.  Will plan admission. [JI]    Clinical Course User Index [JI] Landis Martins                           Medical Decision Making Patient presenting with chest pain, shortness of breath and hypoxia, sent by her PCP, differential diagnoses including acute coronary syndrome, MI, PE, acute COPD exacerbation.  Recurrent pneumonia.  Significant labs including a BNP of 862.  She does have a history of anemia, hemoglobin today is 9.5, this is a microcytic anemia.  Amount and/or Complexity of Data Reviewed Labs: ordered.    Details: Per above Radiology: ordered.    Details: Chest x-ray showing interstitial edema consistent with CHF.  Discussed this along with her elevated BNP level.  She does not have documented CHF on her chart but patient states she has been told she has this in the past.  She is not on any diuretics at home.  Is not clear whether this truly represents new onset CHF or this is an acute exacerbation.  IV Lasix started. Discussion of management or test interpretation with external provider(s): Discussed with Dr. Josephine Cables who accepts patient for admission  Risk Decision regarding hospitalization.           Final Clinical Impression(s) / ED Diagnoses Final diagnoses:  Acute congestive heart failure, unspecified heart failure type [facility])    Rx / DC Orders ED Discharge Orders     None         Landis Martins 07/10/22 2259    Isla Pence, MD 07/10/22 2349

## 2022-07-10 NOTE — H&P (Addendum)
History and Physical    Patient: Tanya Lowe NKN:[id number] DOB: 1970-11-18 DOA: 07/10/2022 DOS: the patient was seen and examined on 07/10/2022 PCP: Danna Hefty, DO  Patient coming from: Home  Chief Complaint:  Chief Complaint  Patient presents with   Chest Pain   HPI: Joe JAKEIA CARRERAS is a 51 y.o. female with medical history significant of CAD status post stent placements x 3, COPD, GERD, hypertension, tobacco abuse who presents to the emergency department due to low level of oxygen noted in her PCPs office.  Patient states that she was recently admitted at [place], [place] about 2 weeks ago due to pneumonia, she states that she has not been back to her baseline since being discharged from the hospital.  She followed up with her PCP today and was slated to be hypoxic with O2 sat in the mid 80s, so an EMS was activated and patient was taken to the ED for further evaluation and management.  Patient endorsed a midsternal pressure which occurred just prior to arrival of the EMS team.  She was treated with 1 dose of aspirin and 1 nitroglycerin sublingual by EMS team en route to the hospital with an improvement in the chest pain.  Patient states that she was recently diagnosed with COPD during a recent hospitalization.  She denies fever, chills, nausea, vomiting, headache, blurry vision.   ED Course:  In the emergency department, she was hemodynamically stable on arrival to the ED except being hypoxic with O2 sat of 87%, supplemental oxygen via [place] at 2 LPM was provided and O2 sats improved to 97%.  Work-up in the ED showed microcytic anemia, BMP was normal, troponin x2 was flat at 6, BNP was 862.  SARS coronavirus 2 was negative. Chest x-ray showed: Central pulmonary vessels are more prominent. Increased interstitial markings are seen in parahilar regions and lower lung fields, more so on the right side. Findings suggest interstitial edema or interstitial pneumonia. There is no focal  pulmonary consolidation. There is no pleural effusion. IV Lasix 40 mg x 1 was given.  Hospitalist was asked to admit patient for further evaluation and management.  Review of Systems: Review of systems as noted in the HPI. All other systems reviewed and are negative.   Past Medical History:  Diagnosis Date   Arthritis    Bilateral hand pain    CAD (coronary artery disease)    a. s/p NSTEMI in 2016 with DES to mid-RCA, DES to [facility] and DES to mid-LAD b. low-risk NST in 05/2016 and 11/2018   Cervical radiculopathy    Chronic neck and back pain    Emphysema lung (HCC)    Fibromyalgia    Hypertension    Kidney stones    hx of w previous lithotripsy   MI (myocardial infarction) ([place])    Pain management    [facility]   Thoracic back pain    Past Surgical History:  Procedure Laterality Date   [place], URETEROSCOPY AND STENT PLACEMENT Left 03/14/2015   Procedure: CYSTOSCOPY WITH LEFT RETROGRADE PYELOGRAM;  Surgeon: Bjorn Loser, MD;  Location: [facility];  Service: Urology;  Laterality: Left;   CYSTOSCOPY WITH RETROGRADE PYELOGRAM, URETEROSCOPY AND STENT PLACEMENT Left 03/20/2015   Procedure: CYSTOSCOPY WITH LEFT  RETROGRADE LEFT URETEROSCOPY AND LEFT  STENT ;  Surgeon: Irine Seal, MD;  Location: [facility];  Service: Urology;  Laterality: Left;   HOLMIUM LASER APPLICATION Left 3/79/0240   Procedure: HOLMIUM  LASER APPLICATION;  Surgeon: Bjorn Pippin, MD;  Location: [facility];  Service: Urology;  Laterality: Left;   LEFT HEART CATHETERIZATION WITH CORONARY ANGIOGRAM N/A 10/25/2014   Procedure: LEFT HEART CATHETERIZATION WITH CORONARY ANGIOGRAM;  Surgeon: Kathleene Hazel, MD;  Location: [facility] CATH LAB;  Service: Cardiovascular;  Laterality: N/A;   LITHOTRIPSY  about 2011   SPINAL FUSION     L5-S1   TUBAL LIGATION      Social History:  reports that she has been smoking cigarettes. She started smoking about 31 years ago. She has a 24.00 pack-year  smoking history. She has never used smokeless tobacco. She reports that she does not drink alcohol and does not use drugs.   Allergies  Allergen Reactions   Lyrica [Pregabalin] Shortness Of Breath    Chest Pain    Bactrim [Sulfamethoxazole-Trimethoprim] Rash   Prednisone Palpitations and Hypertension         Family History  Problem Relation Age of Onset   Diabetes Mother    Stroke Mother    Seizures Other    Cancer Other    Diabetes Other      Prior to Admission medications   Medication Sig Start Date End Date Taking? Authorizing Provider  acetaminophen (TYLENOL) 500 MG tablet Take 1,000 mg by mouth every 6 (six) hours as needed for mild pain.    Yes [provider]  albuterol (PROVENTIL HFA;VENTOLIN HFA) 108 (90 Base) MCG/ACT inhaler Inhale 1-2 puffs into the lungs every 6 (six) hours as needed for wheezing or shortness of breath.   Yes [provider]  aspirin EC 81 MG tablet Take 81 mg by mouth daily.   Yes [provider]  atorvastatin (LIPITOR) 80 MG tablet Take 1 tablet (80 mg total) by mouth daily at 6 PM. 10/26/14  Yes Hager, Kelle Darting, PA-C  busPIRone (BUSPAR) 7.5 MG tablet Take 15 mg by mouth 3 (three) times daily.   Yes [provider]  DULoxetine (CYMBALTA) 30 MG capsule Take 30 mg by mouth daily. Take with 60 mg to equal 90 mg 07/07/22  Yes [provider]  DULoxetine (CYMBALTA) 60 MG capsule Take 60 mg by mouth daily. Take with 30 mg to equal 90 mg   Yes [provider]  FEROSUL 325 (65 Fe) MG tablet Take 325 mg by mouth every other day. 06/30/22  Yes [provider]  fexofenadine (ALLEGRA) 180 MG tablet Take 180 mg by mouth daily.   Yes [provider]  fluticasone (FLONASE) 50 MCG/ACT nasal spray Place 2 sprays into both nostrils daily as needed for allergies.  07/28/17  Yes [provider]  gabapentin (NEURONTIN) 600 MG tablet Take 1,200 mg by mouth 3 (three) times daily.   Yes [provider]  ipratropium-albuterol (DUONEB) 0.5-2.5 (3) MG/3ML SOLN Take 3 mLs by nebulization every 6 (six) hours as needed (shortness of breath). 06/30/22  Yes [provider]  lisinopril (PRINIVIL,ZESTRIL) 2.5 MG tablet Take 1 tablet (2.5 mg total) by mouth daily. 05/11/15  Yes Antoine Poche, MD  metFORMIN (GLUCOPHAGE) 500 MG tablet Take 500 mg by mouth daily with breakfast. 04/30/20  Yes [provider]  metoprolol tartrate (LOPRESSOR) 25 MG tablet Take 1 tablet (25 mg total) by mouth 2 (two) times daily. 08/05/17  Yes Branch, Dorothe Pea, MD  nitroGLYCERIN (NITROSTAT) 0.4 MG SL tablet Place 1 tablet (0.4 mg total) under the tongue every 5 (five) minutes as needed for chest pain. 11/28/15  Yes Antoine Poche,  MD  omeprazole (PRILOSEC) 20 MG capsule Take 20 mg by mouth daily. 03/09/22  Yes [provider]  oxyCODONE-acetaminophen (PERCOCET) 10-325 MG per tablet Take 1 tablet by mouth every 8 (eight) hours as needed for pain.   Yes [provider]  tiZANidine (ZANAFLEX) 4 MG tablet Take 4 mg by mouth 3 (three) times daily. 02/22/15  Yes [provider]  pantoprazole (PROTONIX) 40 MG tablet Take 40 mg by mouth 2 (two) times daily. Patient not taking: Reported on 07/10/2022 01/16/22   [provider]    Physical Exam: BP 123/72 (BP Location: Right Arm)   Pulse 69   Temp 98.1 F (36.7 C) (Oral)   Resp (!) 21   Ht 5\' 2"  (1.575 m)   Wt 78.9 kg   SpO2 94%   BMI 31.83 kg/m   General: [age] y.o. year-old female well developed well nourished in no acute distress.  Alert and oriented x3. HEENT: NCAT, EOMI Neck: Supple, trachea medial Cardiovascular: Regular rate and rhythm with no rubs or gallops.  No thyromegaly or JVD noted.  Bilateral lower extremity edema. 2/4 pulses in all 4 extremities. Respiratory: Bilateral Rales in lower lobes ( R > L).  No wheezes  Abdomen: Soft, nontender nondistended with normal bowel sounds x4  quadrants. Muskuloskeletal: No cyanosis or clubbing noted bilaterally Neuro: CN II-XII intact, strength 5/5 x 4, sensation, reflexes intact Skin: No ulcerative lesions noted or rashes Psychiatry: Judgement and insight appear normal. Mood is appropriate for condition and setting          Labs on Admission:  Basic Metabolic Panel: Recent Labs  Lab 07/10/22 1752  NA 135  K 4.4  CL 98  CO2 27  GLUCOSE 99  BUN 9  CREATININE 0.90  CALCIUM 8.8*   Liver Function Tests: No results for input(s): "AST", "ALT", "ALKPHOS", "BILITOT", "PROT", "ALBUMIN" in the last 168 hours. No results for input(s): "LIPASE", "AMYLASE" in the last 168 hours. No results for input(s): "AMMONIA" in the last 168 hours. CBC: Recent Labs  Lab 07/10/22 1752  WBC 8.9  HGB 9.5*  HCT 33.2*  MCV 79.8*  PLT 302   Cardiac Enzymes: No results for input(s): "CKTOTAL", "CKMB", "CKMBINDEX", "TROPONINI" in the last 168 hours.  BNP (last 3 results) Recent Labs    07/10/22 1902  BNP 862.0*    ProBNP (last 3 results) No results for input(s): "PROBNP" in the last 8760 hours.  CBG: No results for input(s): "GLUCAP" in the last 168 hours.  Radiological Exams on Admission: DG Chest 2 View  Result Date: 07/10/2022 CLINICAL DATA:  Chest pain EXAM: CHEST - 2 VIEW COMPARISON:  07/30/2015 FINDINGS: Transverse diameter of heart is increased. Central pulmonary vessels are more prominent. Increased interstitial markings are seen in parahilar regions and lower lung fields. There is no focal pulmonary consolidation. There is no pleural effusion or pneumothorax. Pain control lead is seen in thoracic spinal canal. IMPRESSION: Central pulmonary vessels are more prominent. Increased interstitial markings are seen in parahilar regions and lower lung fields, more so on the right side. Findings suggest interstitial edema or interstitial pneumonia. There is no focal pulmonary consolidation. There is no pleural effusion. Electronically  Signed   By: Ernie AvenaPalani  Rathinasamy M.D.   On: 07/10/2022 18:21    EKG: I independently viewed the EKG done and my findings are as followed: Normal sinus rhythm at rate of 66 bpm with repolarization abnormality  Assessment/Plan Present on Admission: **None**  Principal Problem:   Congestive heart  failure (CHF) (HCC)  Elevated BNP possibly secondary to new onset CHF Chest x-ray suggestive of interstitial edema/interstitial pneumonia, patient was recently treated and discharged from [facility] in [place] due to pneumonia BNP 862 Echocardiogram done on 11/19/2020 showed LVEF of 65 to 70%.  LV has normal function.  No RWMA.  Mild LVH.  LV diastolic parameters were normal. Patient was started with IV Lasix 40 mg, we shall continue with Lasix 40 mg twice daily Continue lisinopril Continue total input/output, daily weights and fluid restriction Continue Cardiac diet  Echocardiogram in the morning Cardiology will be consulted to see patient in the morning  Microcytic anemia MCV 79.8, H/H 9.5/38.2 Iron studies will be checked Continue FeroSul  Chest pain-resolved Patient complaining of chest pain that resolved after taking nitroglycerin Troponin x2 was flat at 6 She has history of CAD with stent placement Cardiology will be consulted to see patient in the morning  CAD s/p stent placement Continue aspirin, atorvastatin, Lopressor, nitroglycerin.  GERD Continue Protonix  Essential hypertension Continue Lasix, Lopressor, lisinopril  T2DM Continue ISS and hypoglycemic protocol Metformin will be held at this time  COPD Continue Ventolin and DuoNeb  Obesity (BMI 31.83) Diet and lifestyle modification  Chronic back pain Continue oxycodone per home regimen  Tobacco abuse Patient was counseled on tobacco abuse cessation   DVT prophylaxis: Lovenox  Code Status: Full code  Consults: Cardiology  Family Communication: None at bedside  Severity of  Illness: The appropriate patient status for this patient is INPATIENT. Inpatient status is judged to be reasonable and necessary in order to provide the required intensity of service to ensure the patient's safety. The patient's presenting symptoms, physical exam findings, and initial radiographic and laboratory data in the context of their chronic comorbidities is felt to place them at high risk for further clinical deterioration. Furthermore, it is not anticipated that the patient will be medically stable for discharge from the hospital within 2 midnights of admission.   * I certify that at the point of admission it is my clinical judgment that the patient will require inpatient hospital care spanning beyond 2 midnights from the point of admission due to high intensity of service, high risk for further deterioration and high frequency of surveillance required.*  Author: Frankey Shown, DO 07/10/2022 10:47 PM  For on call review [email or web address].

## 2022-07-11 ENCOUNTER — Inpatient Hospital Stay (HOSPITAL_COMMUNITY): Payer: Medicare Other

## 2022-07-11 DIAGNOSIS — I509 Heart failure, unspecified: Secondary | ICD-10-CM | POA: Diagnosis not present

## 2022-07-11 DIAGNOSIS — I2510 Atherosclerotic heart disease of native coronary artery without angina pectoris: Secondary | ICD-10-CM | POA: Diagnosis not present

## 2022-07-11 DIAGNOSIS — E119 Type 2 diabetes mellitus without complications: Secondary | ICD-10-CM

## 2022-07-11 DIAGNOSIS — D509 Iron deficiency anemia, unspecified: Secondary | ICD-10-CM

## 2022-07-11 DIAGNOSIS — J449 Chronic obstructive pulmonary disease, unspecified: Secondary | ICD-10-CM

## 2022-07-11 DIAGNOSIS — R7989 Other specified abnormal findings of blood chemistry: Secondary | ICD-10-CM | POA: Diagnosis not present

## 2022-07-11 DIAGNOSIS — K219 Gastro-esophageal reflux disease without esophagitis: Secondary | ICD-10-CM

## 2022-07-11 DIAGNOSIS — J9601 Acute respiratory failure with hypoxia: Secondary | ICD-10-CM

## 2022-07-11 DIAGNOSIS — R079 Chest pain, unspecified: Secondary | ICD-10-CM

## 2022-07-11 LAB — BASIC METABOLIC PANEL
Anion gap: 11 (ref 5–15)
BUN: 7 mg/dL (ref 6–20)
CO2: 28 mmol/L (ref 22–32)
Calcium: 8.5 mg/dL — ABNORMAL LOW (ref 8.9–10.3)
Chloride: 103 mmol/L (ref 98–111)
Creatinine, Ser: 0.67 mg/dL (ref 0.44–1.00)
GFR, Estimated: >60 mL/min (ref >60)
Glucose, Bld: 82 mg/dL (ref 70–99)
Potassium: 3.5 mmol/L (ref 3.5–5.1)
Sodium: 142 mmol/L (ref 135–145)

## 2022-07-11 LAB — ECHOCARDIOGRAM COMPLETE
AR max vel: 2.26 cm2
AV Area VTI: 2.14 cm2
AV Area mean vel: 1.85 cm2
AV Mean grad: 4 mmHg
AV Peak grad: 7.5 mmHg
Ao pk vel: 1.37 m/s
Area-P 1/2: 3.5 cm2
Height: 62 in
MV VTI: 2.38 cm2
S' Lateral: 2.8 cm
Weight: 2761.92 oz

## 2022-07-11 LAB — GLUCOSE, CAPILLARY
Glucose-Capillary: 107 mg/dL — ABNORMAL HIGH (ref 70–99)
Glucose-Capillary: 165 mg/dL — ABNORMAL HIGH (ref 70–99)
Glucose-Capillary: 107 mg/dL — ABNORMAL HIGH (ref 70–99)
Glucose-Capillary: 104 mg/dL — ABNORMAL HIGH (ref 70–99)

## 2022-07-11 LAB — CBC
HCT: 36.1 % (ref 36.0–46.0)
Hemoglobin: 10.4 g/dL — ABNORMAL LOW (ref 12.0–15.0)
MCH: 23.3 pg — ABNORMAL LOW (ref 26.0–34.0)
MCHC: 28.8 g/dL — ABNORMAL LOW (ref 30.0–36.0)
MCV: 80.8 fL (ref 80.0–100.0)
Platelets: 272 10*3/uL (ref 150–400)
RBC: 4.47 MIL/uL (ref 3.87–5.11)
RDW: 20.7 % — ABNORMAL HIGH (ref 11.5–15.5)
WBC: 7.4 10*3/uL (ref 4.0–10.5)
nRBC: 0 % (ref 0.0–0.2)

## 2022-07-11 LAB — MAGNESIUM: Magnesium: 2.1 mg/dL (ref 1.7–2.4)

## 2022-07-11 LAB — FERRITIN: Ferritin: 41 ng/mL (ref 11–307)

## 2022-07-11 LAB — FOLATE: Folate: 13.3 ng/mL (ref >5.9)

## 2022-07-11 LAB — HEMOGLOBIN A1C
Hgb A1c MFr Bld: 5.8 % — ABNORMAL HIGH (ref 4.8–5.6)
Mean Plasma Glucose: 119.76 mg/dL

## 2022-07-11 LAB — IRON AND TIBC
Iron: 20 ug/dL — ABNORMAL LOW (ref 28–170)
Saturation Ratios: 5 % — ABNORMAL LOW (ref 10.4–31.8)
TIBC: 399 ug/dL (ref 250–450)
UIBC: 379 ug/dL

## 2022-07-11 MED ORDER — PANTOPRAZOLE SODIUM 40 MG PO TBEC
40.0000 mg | DELAYED_RELEASE_TABLET | Freq: Every day | ORAL | Status: DC
  Administered 2022-07-11 – 2022-07-14 (×4): 40 mg via ORAL
  Filled 2022-07-11 (×4): qty 1

## 2022-07-11 MED ORDER — ASPIRIN 81 MG PO TBEC
81.0000 mg | DELAYED_RELEASE_TABLET | Freq: Every day | ORAL | Status: DC
  Administered 2022-07-11 – 2022-07-14 (×4): 81 mg via ORAL
  Filled 2022-07-11 (×5): qty 1

## 2022-07-11 MED ORDER — IPRATROPIUM-ALBUTEROL 0.5-2.5 (3) MG/3ML IN SOLN: 3.0000 mL | Freq: Four times a day (QID) | RESPIRATORY_TRACT | Status: DC | PRN

## 2022-07-11 MED ORDER — ENOXAPARIN SODIUM 40 MG/0.4ML IJ SOSY
40.0000 mg | PREFILLED_SYRINGE | Freq: Every day | INTRAMUSCULAR | Status: DC
  Filled 2022-07-11: qty 0.4

## 2022-07-11 MED ORDER — IOHEXOL 350 MG/ML SOLN
100.0000 mL | Freq: Once | INTRAVENOUS | Status: AC | PRN
  Administered 2022-07-11: 75 mL via INTRAVENOUS

## 2022-07-11 MED ORDER — OXYCODONE-ACETAMINOPHEN 5-325 MG PO TABS
1.0000 | ORAL_TABLET | Freq: Three times a day (TID) | ORAL | Status: DC | PRN
  Administered 2022-07-11 – 2022-07-14 (×7): 1 via ORAL
  Filled 2022-07-11 (×7): qty 1

## 2022-07-11 MED ORDER — OXYCODONE HCL 5 MG PO TABS
5.0000 mg | ORAL_TABLET | Freq: Three times a day (TID) | ORAL | Status: DC | PRN
  Administered 2022-07-11 – 2022-07-14 (×7): 5 mg via ORAL
  Filled 2022-07-11 (×7): qty 1

## 2022-07-11 MED ORDER — LISINOPRIL 5 MG PO TABS
2.5000 mg | ORAL_TABLET | Freq: Every day | ORAL | Status: DC
  Administered 2022-07-11 – 2022-07-14 (×4): 2.5 mg via ORAL
  Filled 2022-07-11 (×4): qty 1

## 2022-07-11 MED ORDER — METOPROLOL TARTRATE 25 MG PO TABS
25.0000 mg | ORAL_TABLET | Freq: Two times a day (BID) | ORAL | Status: DC
  Administered 2022-07-11 – 2022-07-14 (×7): 25 mg via ORAL
  Filled 2022-07-11 (×7): qty 1

## 2022-07-11 MED ORDER — INSULIN ASPART 100 UNIT/ML IJ SOLN
0.0000 [IU] | Freq: Three times a day (TID) | INTRAMUSCULAR | Status: DC
  Administered 2022-07-11 – 2022-07-12 (×3): 3 [IU] via SUBCUTANEOUS
  Administered 2022-07-13 (×2): 2 [IU] via SUBCUTANEOUS
  Administered 2022-07-14: 3 [IU] via SUBCUTANEOUS

## 2022-07-11 MED ORDER — HEPARIN BOLUS VIA INFUSION
4500.0000 [IU] | Freq: Once | INTRAVENOUS | Status: DC
  Filled 2022-07-11: qty 4500

## 2022-07-11 MED ORDER — TIZANIDINE HCL 4 MG PO TABS
4.0000 mg | ORAL_TABLET | Freq: Three times a day (TID) | ORAL | Status: DC
  Administered 2022-07-11 – 2022-07-14 (×10): 4 mg via ORAL
  Filled 2022-07-11 (×10): qty 1

## 2022-07-11 MED ORDER — OXYCODONE-ACETAMINOPHEN 10-325 MG PO TABS: 1.0000 | ORAL_TABLET | Freq: Three times a day (TID) | ORAL | Status: DC | PRN

## 2022-07-11 MED ORDER — FERROUS SULFATE 325 (65 FE) MG PO TABS
325.0000 mg | ORAL_TABLET | ORAL | Status: DC
  Administered 2022-07-11 – 2022-07-13 (×2): 325 mg via ORAL
  Filled 2022-07-11 (×3): qty 1

## 2022-07-11 MED ORDER — FUROSEMIDE 10 MG/ML IJ SOLN
40.0000 mg | Freq: Two times a day (BID) | INTRAMUSCULAR | Status: DC
  Administered 2022-07-11 – 2022-07-14 (×7): 40 mg via INTRAVENOUS
  Filled 2022-07-11 (×7): qty 4

## 2022-07-11 MED ORDER — ATORVASTATIN CALCIUM 40 MG PO TABS
80.0000 mg | ORAL_TABLET | Freq: Every day | ORAL | Status: DC
  Administered 2022-07-11 – 2022-07-13 (×3): 80 mg via ORAL
  Filled 2022-07-11 (×3): qty 2

## 2022-07-11 MED ORDER — ALBUTEROL SULFATE (2.5 MG/3ML) 0.083% IN NEBU: 3.0000 mL | INHALATION_SOLUTION | Freq: Four times a day (QID) | RESPIRATORY_TRACT | Status: DC | PRN

## 2022-07-11 MED ORDER — HEPARIN (PORCINE) 25000 UT/250ML-% IV SOLN
1200.0000 [IU]/h | INTRAVENOUS | Status: DC
  Filled 2022-07-11: qty 250

## 2022-07-11 MED ORDER — NITROGLYCERIN 0.4 MG SL SUBL: 0.4000 mg | SUBLINGUAL_TABLET | SUBLINGUAL | Status: DC | PRN

## 2022-07-11 MED ORDER — GABAPENTIN 300 MG PO CAPS
1200.0000 mg | ORAL_CAPSULE | Freq: Three times a day (TID) | ORAL | Status: DC
  Administered 2022-07-11 – 2022-07-14 (×10): 1200 mg via ORAL
  Filled 2022-07-11 (×10): qty 4

## 2022-07-11 NOTE — Progress Notes (Signed)
Cardiology Consultation   Patient ID: DELECIA VASTINE MRN: [id number]; DOB: 06-Sep-1971  Admit date: 07/10/2022 Date of Consult: 07/11/2022  PCP:  Joana Reamer, DO   [facility] Providers Cardiologist:  Dina Rich, MD   {      Patient Profile:   MORAYMA GODOWN is a 51 y.o. female with a hx of  CAD   who is being seen 07/11/2022 for the evaluation of chest pain  at the request of Dr Marland Mcalpine  .  History of Present Illness:   Ms. Romas is a 51 yo with hx of CAD (s/p PTCA/stent x 3), COPD, GERD , HTN, tob abuse The pt has developed a URI a couple weeks ago    She as admitted in [place] for pneumonia   At D/C not back to baseline      The pt was in her PCP's office yesterday   O2 sats were low   Mid 80s    She was sent to ER  Initially sats in 80s  Improved to 97 with 2 L Power   Trop neg x 2   BNP 862   There given NTG and ASA easing of pressure     She was treated also with IV lasix      Currently the pt denies chest pressure   She says her breating is OK in bed   Past Medical History:  Diagnosis Date   Arthritis    Bilateral hand pain    CAD (coronary artery disease)    a. s/p NSTEMI in 2016 with DES to mid-RCA, DES to [facility] and DES to mid-LAD b. low-risk NST in 05/2016 and 11/2018   Cervical radiculopathy    Chronic neck and back pain    Emphysema lung (HCC)    Fibromyalgia    Hypertension    Kidney stones    hx of w previous lithotripsy   MI (myocardial infarction) (HCC)    Pain management    [facility]   Thoracic back pain     Past Surgical History:  Procedure Laterality Date   CARDIAC SURGERY     CYSTOSCOPY WITH RETROGRADE PYELOGRAM, URETEROSCOPY AND STENT PLACEMENT Left 03/14/2015   Procedure: CYSTOSCOPY WITH LEFT RETROGRADE PYELOGRAM;  Surgeon: Alfredo Martinez, MD;  Location: [facility];  Service: Urology;  Laterality: Left;   CYSTOSCOPY WITH RETROGRADE PYELOGRAM, URETEROSCOPY AND STENT PLACEMENT Left 03/20/2015   Procedure: CYSTOSCOPY WITH LEFT   RETROGRADE LEFT URETEROSCOPY AND LEFT  STENT ;  Surgeon: Bjorn Pippin, MD;  Location: [facility];  Service: Urology;  Laterality: Left;   HOLMIUM LASER APPLICATION Left 03/20/2015   Procedure: HOLMIUM LASER APPLICATION;  Surgeon: Bjorn Pippin, MD;  Location: [facility];  Service: Urology;  Laterality: Left;   LEFT HEART CATHETERIZATION WITH CORONARY ANGIOGRAM N/A 10/25/2014   Procedure: LEFT HEART CATHETERIZATION WITH CORONARY ANGIOGRAM;  Surgeon: Kathleene Hazel, MD;  Location: [facility] CATH LAB;  Service: Cardiovascular;  Laterality: N/A;   LITHOTRIPSY  about 2011   SPINAL FUSION     L5-S1   TUBAL LIGATION         Inpatient Medications: Scheduled Meds:  aspirin EC  81 mg Oral Daily   atorvastatin  80 mg Oral q1800   enoxaparin (LOVENOX) injection  40 mg Subcutaneous Daily   ferrous sulfate  325 mg Oral QODAY   furosemide  40 mg Intravenous Q12H   gabapentin  1,200 mg Oral TID   insulin aspart  0-15 Units Subcutaneous TID WC  lisinopril  2.5 mg Oral Daily   metoprolol tartrate  25 mg Oral BID   pantoprazole  40 mg Oral Daily   tiZANidine  4 mg Oral TID   Continuous Infusions:  PRN Meds: albuterol, ipratropium-albuterol, nitroGLYCERIN, oxyCODONE **AND** oxyCODONE-acetaminophen  Allergies:    Allergies  Allergen Reactions   Lyrica [Pregabalin] Shortness Of Breath    Chest Pain    Bactrim [Sulfamethoxazole-Trimethoprim] Rash   Prednisone Palpitations and Hypertension         Social History:   Social History   Socioeconomic History   Marital status: Divorced    Spouse name: Not on file   Number of children: Not on file   Years of education: Not on file   Highest education level: Not on file  Occupational History   Not on file  Tobacco Use   Smoking status: Every Day    Packs/day: 1.00    Years: 24.00    Total pack years: 24.00    Types: Cigarettes    Start date: 10/25/1990   Smokeless tobacco: Never  Vaping Use   Vaping Use: Never used  Substance and Sexual Activity    Alcohol use: No    Alcohol/week: 0.0 standard drinks of alcohol   Drug use: No   Sexual activity: Not on file  Other Topics Concern   Not on file  Social History Narrative   Not on file   Social Determinants of Health   Financial Resource Strain: Not on file  Food Insecurity: Not on file  Transportation Needs: Not on file  Physical Activity: Not on file  Stress: Not on file  Social Connections: Not on file  Intimate Partner Violence: Not on file    Family History:    Family History  Problem Relation Age of Onset   Diabetes Mother    Stroke Mother    Seizures Other    Cancer Other    Diabetes Other      ROS:  Please see the history of present illness.   All other ROS reviewed and negative.     Physical Exam/Data:   Vitals:   07/11/22 0430 07/11/22 0500 07/11/22 0640 07/11/22 0641  BP: 121/66 122/66  134/71  Pulse: 67 68  70  Resp: 20 20  20   Temp:    98.1 F (36.7 C)  TempSrc:    Oral  SpO2: 93% 93%  97%  Weight:   78.3 kg   Height:   5\' 2"  (1.575 m)     Intake/Output Summary (Last 24 hours) at 07/11/2022 1120 Last data filed at 07/11/2022 07/13/2022 Gross per 24 hour  Intake --  Output 1000 ml  Net -1000 ml      07/11/2022    6:40 AM 07/10/2022    5:12 PM 02/19/2022   10:36 AM  Last 3 Weights  Weight (lbs) 172 lb 9.9 oz 174 lb 177 lb  Weight (kg) 78.3 kg 78.926 kg 80.287 kg     Body mass index is 31.57 kg/m.  General:  Well nourished, well developed, in no acute distress HEENT: normal Neck: JVP is mildly increased  Vascular: No carotid bruits; Distal pulses 2+ bilaterally Cardiac:  normal S1, S2; RRR; no murmur  Lungs: Moving air  Mild rhonchi   Abd: soft, nontender, no hepatomegaly  Ext: no edema Musculoskeletal:  No deformities, BUE and BLE strength normal and equal Skin: warm and dry  Neuro:  CNs 2-12 intact, no focal abnormalities noted Psych:  Normal affect   EKG:  The EKG was personally reviewed and demonstrates:  From 07/10/22  SR   Nonspecific ST changes  Telemetry:  Telemetry was personally reviewed and demonstrates:  SR   Relevant CV Studies:  Echo 9.22.23  Left ventricular ejection fraction, by estimation, is 65 to 70%. The left ventricle has normal function. The left ventricle has no regional wall motion abnormalities. 1. RV apex appears to be thickening more normally (? McConnell's sign) Compared to echo from 2022, RV function is different . Right ventricular systolic function mild to moderately reduced. The right ventricular size is mildly enlarged. There is normal pulmonary artery systolic pressure. 2. 3. Right atrial size was mildly dilated. 4. The mitral valve is normal in structure. Trivial mitral valve regurgitation. The aortic valve is tricuspid. Aortic valve regurgitation is not visualized. Aortic valve sclerosis is present, with no evidence of aortic valve stenosis. 5. The inferior vena cava is dilated in size with >50% respiratory variability, suggesting right atrial pressure of 8 mmHg. Echo 11/19/20  Left ventricular ejection fraction, by estimation, is 65 to 70%. The left ventricle has normal function. The left ventricle has no regional wall motion abnormalities. There is mild left ventricular hypertrophy. Left ventricular diastolic parameters were normal. 2. Right ventricular systolic function is normal. The right ventricular size is normal. Tricuspid regurgitation signal is inadequate for assessing PA pressure. 3. The mitral valve is grossly normal. Trivial mitral valve regurgitation. 4. The aortic valve is tricuspid. Aortic valve regurgitation is trivial. 5. The inferior vena cava is normal in size with greater than 50% respiratory variability, suggesting right atrial pressure of 3 mmHg.  Laboratory Data:  High Sensitivity Troponin:   Recent Labs  Lab 07/10/22 1752 07/10/22 1902  TROPONINIHS 6 6     Chemistry Recent Labs  Lab 07/10/22 1752 07/11/22 0435  NA 135 142  K 4.4 3.5   CL 98 103  CO2 27 28  GLUCOSE 99 82  BUN 9 7  CREATININE 0.90 0.67  CALCIUM 8.8* 8.5*  MG  --  2.1  GFRNONAA >60 >60  ANIONGAP 10 11    No results for input(s): "PROT", "ALBUMIN", "AST", "ALT", "ALKPHOS", "BILITOT" in the last 168 hours. Lipids No results for input(s): "CHOL", "TRIG", "HDL", "LABVLDL", "LDLCALC", "CHOLHDL" in the last 168 hours.  Hematology Recent Labs  Lab 07/10/22 1752 07/11/22 0435  WBC 8.9 7.4  RBC 4.16 4.47  HGB 9.5* 10.4*  HCT 33.2* 36.1  MCV 79.8* 80.8  MCH 22.8* 23.3*  MCHC 28.6* 28.8*  RDW 20.4* 20.7*  PLT 302 272   Thyroid No results for input(s): "TSH", "FREET4" in the last 168 hours.  BNP Recent Labs  Lab 07/10/22 1902  BNP 862.0*    DDimer No results for input(s): "DDIMER" in the last 168 hours.   Radiology/Studies:  DG Chest 2 View  Result Date: 07/10/2022 CLINICAL DATA:  Chest pain EXAM: CHEST - 2 VIEW COMPARISON:  07/30/2015 FINDINGS: Transverse diameter of heart is increased. Central pulmonary vessels are more prominent. Increased interstitial markings are seen in parahilar regions and lower lung fields. There is no focal pulmonary consolidation. There is no pleural effusion or pneumothorax. Pain control lead is seen in thoracic spinal canal. IMPRESSION: Central pulmonary vessels are more prominent. Increased interstitial markings are seen in parahilar regions and lower lung fields, more so on the right side. Findings suggest interstitial edema or interstitial pneumonia. There is no focal pulmonary consolidation. There is no pleural effusion. Electronically Signed   By: Prudy Feeler.D.  On: 07/10/2022 18:21     Assessment and Plan:   Chest pressure   Pt with hx of CAD    Recent URI Yesterday seen in PCP office   Hypoxic with chest pressure    Symptoms improved with NTG and lasix     Trop neg x 2  BNP mildly increased  Symptoms may reflect diastolic dysfunction in setting of hypoxia       I have reviewed Echo   RV appears  dilated and function is not normal   This appears different from echo in 2022   I question if patient had a PE    I would recomm anticoagulation and CT scan to verify       If negative I would give additional diuresis    Follow I/O and Labs (BNP, CR0 Pt needs to stop smoking  Follow echo as outpt    2  CAD   Pt has had interventions in past    I do not think current presentation reflects active ischemia fro mCAD   3  HTN   Follow   4  Tob   Counselled on cessation      :[id number]}         For questions or updates, please contact [facility] Please consult [email or web address] for contact info under    Signed, Dietrich PatesPaula Simpson Paulos, MD  07/11/2022 11:20 AM

## 2022-07-11 NOTE — Progress Notes (Signed)
ANTICOAGULATION CONSULT NOTE - Initial Consult  Pharmacy Consult for heparin Indication: suspected pulmonary embolus  Allergies  Allergen Reactions   Lyrica [Pregabalin] Shortness Of Breath    Chest Pain    Bactrim [Sulfamethoxazole-Trimethoprim] Rash   Prednisone Palpitations and Hypertension         Patient Measurements: Height: 5\' 2"  (157.5 cm) Weight: 78.3 kg (172 lb 9.9 oz) IBW/kg (Calculated) : 50.1 Heparin Dosing Weight: 67 kg  Vital Signs: Temp: 98.3 F (36.8 C) (09/22 1159) Temp Source: Oral (09/22 1159) BP: 107/68 (09/22 1159) Pulse Rate: 67 (09/22 1159)  Labs: Recent Labs    07/10/22 1752 07/10/22 1902 07/11/22 0435  HGB 9.5*  --  10.4*  HCT 33.2*  --  36.1  PLT 302  --  272  CREATININE 0.90  --  0.67  TROPONINIHS 6 6  --     Estimated Creatinine Clearance: 78.8 mL/min (by C-G formula based on SCr of 0.67 mg/dL).   Medical History: Past Medical History:  Diagnosis Date   Arthritis    Bilateral hand pain    CAD (coronary artery disease)    a. s/p NSTEMI in 2016 with DES to mid-RCA, DES to [facility] and DES to mid-LAD b. low-risk NST in 05/2016 and 11/2018   Cervical radiculopathy    Chronic neck and back pain    Emphysema lung (HCC)    Fibromyalgia    Hypertension    Kidney stones    hx of w previous lithotripsy   MI (myocardial infarction) ([place])    Pain management    [facility]   Thoracic back pain     Medications:  Medications Prior to Admission  Medication Sig Dispense Refill Last Dose   acetaminophen (TYLENOL) 500 MG tablet Take 1,000 mg by mouth every 6 (six) hours as needed for mild pain.    unknown   albuterol (PROVENTIL HFA;VENTOLIN HFA) 108 (90 Base) MCG/ACT inhaler Inhale 1-2 puffs into the lungs every 6 (six) hours as needed for wheezing or shortness of breath.   unknown   aspirin EC 81 MG tablet Take 81 mg by mouth daily.   07/10/2022   atorvastatin (LIPITOR) 80 MG tablet Take 1 tablet (80 mg total) by mouth daily at 6 PM. 30 tablet  11 07/09/2022   busPIRone (BUSPAR) 7.5 MG tablet Take 15 mg by mouth 3 (three) times daily.   07/10/2022   DULoxetine (CYMBALTA) 30 MG capsule Take 30 mg by mouth daily. Take with 60 mg to equal 90 mg   07/10/2022   DULoxetine (CYMBALTA) 60 MG capsule Take 60 mg by mouth daily. Take with 30 mg to equal 90 mg   07/10/2022   FEROSUL 325 (65 Fe) MG tablet Take 325 mg by mouth every other day.   07/09/2022   fexofenadine (ALLEGRA) 180 MG tablet Take 180 mg by mouth daily.   07/10/2022   fluticasone (FLONASE) 50 MCG/ACT nasal spray Place 2 sprays into both nostrils daily as needed for allergies.    unknown   gabapentin (NEURONTIN) 600 MG tablet Take 1,200 mg by mouth 3 (three) times daily.   07/10/2022   ipratropium-albuterol (DUONEB) 0.5-2.5 (3) MG/3ML SOLN Take 3 mLs by nebulization every 6 (six) hours as needed (shortness of breath).   07/10/2022   lisinopril (PRINIVIL,ZESTRIL) 2.5 MG tablet Take 1 tablet (2.5 mg total) by mouth daily. 90 tablet 3 07/09/2022   metFORMIN (GLUCOPHAGE) 500 MG tablet Take 500 mg by mouth daily with breakfast.   07/10/2022   metoprolol tartrate (  LOPRESSOR) 25 MG tablet Take 1 tablet (25 mg total) by mouth 2 (two) times daily. 180 tablet 3 07/10/2022 at 1600   nitroGLYCERIN (NITROSTAT) 0.4 MG SL tablet Place 1 tablet (0.4 mg total) under the tongue every 5 (five) minutes as needed for chest pain. 25 tablet 3 unknown   omeprazole (PRILOSEC) 20 MG capsule Take 20 mg by mouth daily.   07/10/2022   oxyCODONE-acetaminophen (PERCOCET) 10-325 MG per tablet Take 1 tablet by mouth every 8 (eight) hours as needed for pain.   07/10/2022   tiZANidine (ZANAFLEX) 4 MG tablet Take 4 mg by mouth 3 (three) times daily.   07/10/2022   pantoprazole (PROTONIX) 40 MG tablet Take 40 mg by mouth 2 (two) times daily. (Patient not taking: Reported on 07/10/2022)   Not Taking    Assessment: Pharmacy consulted to dose heparin in patient with suspected pulmonary embolism w/ RV Systolic Dysfunction.  Patient is  not on anticoagulation prior to admission.  Goal of Therapy:  Heparin level 0.3-0.7 units/ml Monitor platelets by anticoagulation protocol: Yes   Plan:  Give 4500 units bolus x 1 Start heparin infusion at 1200 units/hr Check anti-Xa level in 6 hours and daily while on heparin Continue to monitor H&H and platelets  Margot Ables, PharmD Clinical Pharmacist 07/11/2022 4:59 PM

## 2022-07-11 NOTE — Plan of Care (Signed)

## 2022-07-11 NOTE — Progress Notes (Signed)
*  PRELIMINARY RESULTS* Echocardiogram 2D Echocardiogram has been performed.  Tanya Lowe 07/11/2022, 10:41 AM

## 2022-07-11 NOTE — Care Management Important Message (Signed)
Important Message  Patient Details  Name: Tanya Lowe MRN: [id number] Date of Birth: [birth year]   Medicare Important Message Given:  Yes (copy mailed to address on file due to contact precautions)     Tommy Medal 07/11/2022, 12:47 PM

## 2022-07-11 NOTE — TOC Progression Note (Signed)
  Transition of Care [facility]) Screening Note   Patient Details  Name: AIDEE LATIMORE Date of Birth: 1971-01-28   Transition of Care [facility]) CM/SW Contact:    Shade Flood, LCSW Phone Number: 07/11/2022, 9:04 AM    Transition of Care Department [facility]) has reviewed patient and no TOC needs have been identified at this time. We will continue to monitor patient advancement through interdisciplinary progression rounds. If new patient transition needs arise, please place a TOC consult.

## 2022-07-11 NOTE — Progress Notes (Signed)
PROGRESS NOTE    Tanya Lowe  ZOX:[id number] DOB: 07/07/1971 DOA: 07/10/2022 PCP: Joana ReamerMullis, Kiersten P, DO   Brief Narrative:  HPI per Dr. Frankey Shownladapo Adefeso on 07/10/22 Tanya Lowe is a 51 y.o. female with medical history significant of CAD status post stent placements x 3, COPD, GERD, hypertension, tobacco abuse who presents to the emergency department due to low level of oxygen noted in her PCPs office.  Patient states that she was recently admitted at [place] [place] about 2 weeks ago due to pneumonia, she states that she has not been back to her baseline since being discharged from the hospital.  She followed up with her PCP today and was slated to be hypoxic with O2 sat in the mid 80s, so an EMS was activated and patient was taken to the ED for further evaluation and management.  Patient endorsed a midsternal pressure which occurred just prior to arrival of the EMS team.  She was treated with 1 dose of aspirin and 1 nitroglycerin sublingual by EMS team en route to the hospital with an improvement in the chest pain.  Patient states that she was recently diagnosed with COPD during a recent hospitalization.  She denies fever, chills, nausea, vomiting, headache, blurry vision.    ED Course:  In the emergency department, she was hemodynamically stable on arrival to the ED except being hypoxic with O2 sat of 87%, supplemental oxygen via [place] at 2 LPM was provided and O2 sats improved to 97%.  Work-up in the ED showed microcytic anemia, BMP was normal, troponin x2 was flat at 6, BNP was 862.  SARS coronavirus 2 was negative. Chest x-ray showed: Central pulmonary vessels are more prominent. Increased interstitial markings are seen in parahilar regions and lower lung fields, more so on the right side. Findings suggest interstitial edema or interstitial pneumonia. There is no focal pulmonary consolidation. There is no pleural effusion. IV Lasix 40 mg x 1 was given.  Hospitalist was asked to admit patient  for further evaluation and management.  **Interim History Patient was given IV Lasix and started diuresing.  Cardiology was notified she had echocardiogram done.  She is to remain on supplemental oxygen and thinks that her legs are less swollen today.  No other concerns or complaints at this time.  Assessment and Plan:  Acute respiratory failure with hypoxia in the setting of suspected new onset CHF unclear type at this time but suspect diastolic dysfunction -Chest x-ray done and was suggestive of interstitial edema and interstitial pneumonia patient was recently treated for pneumonia at [facility] and doing well [place]; Official read showed "Central pulmonary vessels are more prominent. Increased interstitial markings are seen in parahilar regions and lower lung fields, more so on the right side. Findings suggest interstitial edema or interstitial pneumonia. There is no focal pulmonary consolidation. There is no pleural effusion." -Was placed on supplemental oxygen due to her oxygen saturations being in the 80s -SpO2: 92 % O2 Flow Rate (L/min): 2 L/min -Continuous pulse oximetry and maintain O2 saturations greater than 90% -Continue supplemental oxygen via nasal cannula wean O2 as tolerated to room air -Had a echocardiogram done previously last year which showed an EF of 65 to 70% with a left ventricular function being normal and had no RWMA but did have mild LVH and left ventricular diastolic parameters were normal at that time -She was initiated on IV Lasix in the ED and will continue IV Lasix 40 mg twice daily -BNP was elevated at  862.0 on admission -We will continue lisinopril -Strict I's and O's and daily weights -Repeat chest x-ray in a.m. and echocardiogram this visit -Placed on a cardiac diet -She will need an ambulatory home O2 screen prior to discharge -Cardiology consulted for further evaluation recommendations  Chest pain rule out ACS -Chest pain is likely resolved and she is  complaining chest pain resolved after nitroglycerin -She does have a history of CAD with stent placement x3 -Troponin x2 was flat at 6 -Cardiology has been consulted and appreciate further evaluation recommendations -In the interim we will continue aspirin 81 mg p.o. daily, atorvastatin 80 mg p.o. daily, Toprol tartrate as well as nitroglycerin 0.4 mg and lisinopril 2.5 mg p.o. daily  GERD/GI prophylaxis -Continue with pantoprazole 40 g p.o. daily  Microcytic Anemia -Patient's hemoglobin/hematocrit went from 9.5/33.2 with an MCV of 79.8-10.4/36.1 with an MCV of 80.8 -Anemia panel was checked and showed an iron level of 20, UIBC 379, TIBC 399, saturation ratios of 5%, ferritin level 41, folate of 13.3 -Continue with ferrous sulfate 325 mg p.o. every other day -Continue monitor for signs and symptoms bleeding; no overt bleeding noted  Essential hypertension -Continue diuresis IV Lasix 40 mg twice daily, metoprolol tartrate as well as lisinopril -Last blood pressure reading is 107/68  Diabetes Mellitus Type 2 -Hemoglobin A1c is now 5.8 -Continue moderate NovoLog sign scale insulin AC and monitor CBGs per protocol and adjust insulin regimen as necessary  COPD -Continue with DuoNeb 3 MLS every 6 as needed for shortness of breath as well as albuterol 3 mL IH every 6 as needed for wheezing or shortness of breath  Chronic back pain -Continue with oxycodone IR 10 mg - 325 mg every 8 hours as needed moderate and severe pain along with tizanidine 4 mg p.o. 3 times daily as well as gabapentin 1200 mg p.o. 3 times daily  Tobacco Abuse -Smoking cessation counseling given -If necessary will place on Nicotine Patch  Obesity -Complicates overall prognosis and care -Estimated body mass index is 31.57 kg/m as calculated from the following:   Height as of this encounter: 5\' 2"  (1.575 m).   Weight as of this encounter: 78.3 kg.  -Weight Loss and Dietary Counseling given  DVT prophylaxis:  enoxaparin (LOVENOX) injection 40 mg Start: 07/11/22 1000    Code Status: Prior Family Communication: Discussed with daughter at bedside  Disposition Plan:  Level of care: Telemetry Status is: Inpatient Remains inpatient appropriate because: Continues to get IV diuresis and needs further cardiac evaluation and clearance  Consultants:  Cardiology Dr. 07/13/22  Procedures:  Echocardiogram  Antimicrobials:  Anti-infectives (From admission, onward)    None       Subjective: Seen and examined at bedside and still wearing supplemental oxygen.  States her legs are less swollen.  No nausea or vomiting.  Denies any chest pain or lightheadedness or dizziness currently and had chest pain prior to coming in which is now improved.  No other concerns or complaints at this time.  Objective: Vitals:   07/11/22 0500 07/11/22 0640 07/11/22 0641 07/11/22 1159  BP: 122/66  134/71 107/68  Pulse: 68  70 67  Resp: 20  20 20   Temp:   98.1 F (36.7 C) 98.3 F (36.8 C)  TempSrc:   Oral Oral  SpO2: 93%  97% 92%  Weight:  78.3 kg    Height:  5\' 2"  (1.575 m)      Intake/Output Summary (Last 24 hours) at 07/11/2022 1533 Last data filed at 07/11/2022  0512 Gross per 24 hour  Intake --  Output 1000 ml  Net -1000 ml   Filed Weights   07/10/22 1712 07/11/22 0640  Weight: 78.9 kg 78.3 kg   Examination: Physical Exam:  Constitutional: WN/WD obese Caucasian female currently no acute distress Respiratory: Diminished to auscultation bilaterally, no wheezing, rales, rhonchi or crackles. Normal respiratory effort and patient is not tachypenic. No accessory muscle use.  Unlabored breathing Cardiovascular: RRR, no murmurs / rubs / gallops. S1 and S2 auscultated.  Has 1+ lower extremity edema bilaterally Abdomen: Soft, non-tender, distended secondary body habitus. Bowel sounds positive.  GU: Deferred. Musculoskeletal: No clubbing / cyanosis of digits/nails. No joint deformity upper and lower  extremities. Skin: No rashes, lesions, ulcers on limited skin evaluation. No induration; Warm and dry.  Neurologic: CN 2-12 grossly intact with no focal deficits. Romberg sign and cerebellar reflexes not assessed.  Psychiatric: Normal judgment and insight. Alert and oriented x 3. Normal mood and appropriate affect.   Data Reviewed: I have personally reviewed following labs and imaging studies  CBC: Recent Labs  Lab 07/10/22 1752 07/11/22 0435  WBC 8.9 7.4  HGB 9.5* 10.4*  HCT 33.2* 36.1  MCV 79.8* 80.8  PLT 302 008   Basic Metabolic Panel: Recent Labs  Lab 07/10/22 1752 07/11/22 0435  NA 135 142  K 4.4 3.5  CL 98 103  CO2 27 28  GLUCOSE 99 82  BUN 9 7  CREATININE 0.90 0.67  CALCIUM 8.8* 8.5*  MG  --  2.1   GFR: Estimated Creatinine Clearance: 78.8 mL/min (by C-G formula based on SCr of 0.67 mg/dL). Liver Function Tests: No results for input(s): "AST", "ALT", "ALKPHOS", "BILITOT", "PROT", "ALBUMIN" in the last 168 hours. No results for input(s): "LIPASE", "AMYLASE" in the last 168 hours. No results for input(s): "AMMONIA" in the last 168 hours. Coagulation Profile: No results for input(s): "INR", "PROTIME" in the last 168 hours. Cardiac Enzymes: No results for input(s): "CKTOTAL", "CKMB", "CKMBINDEX", "TROPONINI" in the last 168 hours. BNP (last 3 results) No results for input(s): "PROBNP" in the last 8760 hours. HbA1C: Recent Labs    07/11/22 0435  HGBA1C 5.8*   CBG: Recent Labs  Lab 07/11/22 0739 07/11/22 1119  GLUCAP 107* 165*   Lipid Profile: No results for input(s): "CHOL", "HDL", "LDLCALC", "TRIG", "CHOLHDL", "LDLDIRECT" in the last 72 hours. Thyroid Function Tests: No results for input(s): "TSH", "T4TOTAL", "FREET4", "T3FREE", "THYROIDAB" in the last 72 hours. Anemia Panel: Recent Labs    07/11/22 0435  FOLATE 13.3  FERRITIN 41  TIBC 399  IRON 20*   Sepsis Labs: No results for input(s): "PROCALCITON", "LATICACIDVEN" in the last 168  hours.  Recent Results (from the past 240 hour(s))  SARS Coronavirus 2 by RT PCR (hospital order, performed in [facility] hospital lab) *cepheid single result test* Anterior Nasal Swab     Status: None   Collection Time: 07/10/22  6:45 PM   Specimen: Anterior Nasal Swab  Result Value Ref Range Status   SARS Coronavirus 2 by RT PCR NEGATIVE NEGATIVE Final    Comment: (NOTE) SARS-CoV-2 target nucleic acids are NOT DETECTED.  The SARS-CoV-2 RNA is generally detectable in upper and lower respiratory specimens during the acute phase of infection. The lowest concentration of SARS-CoV-2 viral copies this assay can detect is 250 copies / mL. A negative result does not preclude SARS-CoV-2 infection and should not be used as the sole basis for treatment or other patient management decisions.  A negative result  may occur with improper specimen collection / handling, submission of specimen other than nasopharyngeal swab, presence of viral mutation(s) within the areas targeted by this assay, and inadequate number of viral copies (<250 copies / mL). A negative result must be combined with clinical observations, patient history, and epidemiological information.  Fact Sheet for Patients:   RoadLapTop.co.za  Fact Sheet for Healthcare Providers: http://kim-miller.com/  This test is not yet approved or  cleared by the [place] FDA and has been authorized for detection and/or diagnosis of SARS-CoV-2 by FDA under an Emergency Use Authorization (EUA).  This EUA will remain in effect (meaning this test can be used) for the duration of the COVID-19 declaration under Section 564(b)(1) of the Act, 21 U.S.C. section 360bbb-3(b)(1), unless the authorization is terminated or revoked sooner.  Performed at [facility], [address] [place], [place] [address]     Radiology Studies: DG Chest 2 View  Result Date: 07/10/2022 CLINICAL DATA:  Chest pain  EXAM: CHEST - 2 VIEW COMPARISON:  07/30/2015 FINDINGS: Transverse diameter of heart is increased. Central pulmonary vessels are more prominent. Increased interstitial markings are seen in parahilar regions and lower lung fields. There is no focal pulmonary consolidation. There is no pleural effusion or pneumothorax. Pain control lead is seen in thoracic spinal canal. IMPRESSION: Central pulmonary vessels are more prominent. Increased interstitial markings are seen in parahilar regions and lower lung fields, more so on the right side. Findings suggest interstitial edema or interstitial pneumonia. There is no focal pulmonary consolidation. There is no pleural effusion. Electronically Signed   By: Ernie Avena M.D.   On: 07/10/2022 18:21    Scheduled Meds:  aspirin EC  81 mg Oral Daily   atorvastatin  80 mg Oral q1800   enoxaparin (LOVENOX) injection  40 mg Subcutaneous Daily   ferrous sulfate  325 mg Oral QODAY   furosemide  40 mg Intravenous Q12H   gabapentin  1,200 mg Oral TID   insulin aspart  0-15 Units Subcutaneous TID WC   lisinopril  2.5 mg Oral Daily   metoprolol tartrate  25 mg Oral BID   pantoprazole  40 mg Oral Daily   tiZANidine  4 mg Oral TID   Continuous Infusions:   LOS: 1 day   Marguerita Merles, DO [facility] Available via Epic secure chat 7am-7pm After these hours, please refer to coverage provider listed on [email or web address] 07/11/2022, 3:33 PM

## 2022-07-12 ENCOUNTER — Encounter (HOSPITAL_COMMUNITY): Payer: Self-pay | Admitting: Internal Medicine

## 2022-07-12 ENCOUNTER — Inpatient Hospital Stay (HOSPITAL_COMMUNITY): Payer: Medicare Other

## 2022-07-12 DIAGNOSIS — I509 Heart failure, unspecified: Secondary | ICD-10-CM | POA: Diagnosis not present

## 2022-07-12 DIAGNOSIS — J449 Chronic obstructive pulmonary disease, unspecified: Secondary | ICD-10-CM | POA: Diagnosis not present

## 2022-07-12 DIAGNOSIS — I2510 Atherosclerotic heart disease of native coronary artery without angina pectoris: Secondary | ICD-10-CM | POA: Diagnosis not present

## 2022-07-12 DIAGNOSIS — R590 Localized enlarged lymph nodes: Secondary | ICD-10-CM

## 2022-07-12 DIAGNOSIS — R7989 Other specified abnormal findings of blood chemistry: Secondary | ICD-10-CM | POA: Diagnosis not present

## 2022-07-12 LAB — COMPREHENSIVE METABOLIC PANEL
ALT: 27 U/L (ref 0–44)
AST: 20 U/L (ref 15–41)
Albumin: 2.8 g/dL — ABNORMAL LOW (ref 3.5–5.0)
Alkaline Phosphatase: 82 U/L (ref 38–126)
Anion gap: 7 (ref 5–15)
BUN: 7 mg/dL (ref 6–20)
CO2: 32 mmol/L (ref 22–32)
Calcium: 8.7 mg/dL — ABNORMAL LOW (ref 8.9–10.3)
Chloride: 102 mmol/L (ref 98–111)
Creatinine, Ser: 0.68 mg/dL (ref 0.44–1.00)
GFR, Estimated: >60 mL/min (ref >60)
Glucose, Bld: 89 mg/dL (ref 70–99)
Potassium: 3.3 mmol/L — ABNORMAL LOW (ref 3.5–5.1)
Sodium: 141 mmol/L (ref 135–145)
Total Bilirubin: 0.8 mg/dL (ref 0.3–1.2)
Total Protein: 6.1 g/dL — ABNORMAL LOW (ref 6.5–8.1)

## 2022-07-12 LAB — CBC WITH DIFFERENTIAL/PLATELET
Abs Immature Granulocytes: 0.01 10*3/uL (ref 0.00–0.07)
Basophils Absolute: 0 10*3/uL (ref 0.0–0.1)
Basophils Relative: 0 %
Eosinophils Absolute: 0 10*3/uL (ref 0.0–0.5)
Eosinophils Relative: 0 %
HCT: 35.3 % — ABNORMAL LOW (ref 36.0–46.0)
Hemoglobin: 10.1 g/dL — ABNORMAL LOW (ref 12.0–15.0)
Immature Granulocytes: 0 %
Lymphocytes Relative: 30 %
Lymphs Abs: 1.5 10*3/uL (ref 0.7–4.0)
MCH: 23 pg — ABNORMAL LOW (ref 26.0–34.0)
MCHC: 28.6 g/dL — ABNORMAL LOW (ref 30.0–36.0)
MCV: 80.4 fL (ref 80.0–100.0)
Monocytes Absolute: 0.2 10*3/uL (ref 0.1–1.0)
Monocytes Relative: 5 %
Neutro Abs: 3.2 10*3/uL (ref 1.7–7.7)
Neutrophils Relative %: 65 %
Platelets: 250 10*3/uL (ref 150–400)
RBC: 4.39 MIL/uL (ref 3.87–5.11)
RDW: 20.6 % — ABNORMAL HIGH (ref 11.5–15.5)
WBC: 4.9 10*3/uL (ref 4.0–10.5)
nRBC: 0 % (ref 0.0–0.2)

## 2022-07-12 LAB — GLUCOSE, CAPILLARY
Glucose-Capillary: 93 mg/dL (ref 70–99)
Glucose-Capillary: 156 mg/dL — ABNORMAL HIGH (ref 70–99)
Glucose-Capillary: 186 mg/dL — ABNORMAL HIGH (ref 70–99)
Glucose-Capillary: 117 mg/dL — ABNORMAL HIGH (ref 70–99)

## 2022-07-12 LAB — PHOSPHORUS: Phosphorus: 4.8 mg/dL — ABNORMAL HIGH (ref 2.5–4.6)

## 2022-07-12 LAB — BRAIN NATRIURETIC PEPTIDE: B Natriuretic Peptide: 543 pg/mL — ABNORMAL HIGH (ref 0.0–100.0)

## 2022-07-12 LAB — HEPARIN LEVEL (UNFRACTIONATED): Heparin Unfractionated: <0.1 IU/mL — ABNORMAL LOW (ref 0.30–0.70)

## 2022-07-12 LAB — MAGNESIUM: Magnesium: 2 mg/dL (ref 1.7–2.4)

## 2022-07-12 MED ORDER — ENOXAPARIN SODIUM 40 MG/0.4ML IJ SOSY
40.0000 mg | PREFILLED_SYRINGE | INTRAMUSCULAR | Status: DC
  Administered 2022-07-12 – 2022-07-13 (×2): 40 mg via SUBCUTANEOUS
  Filled 2022-07-12 (×2): qty 0.4

## 2022-07-12 MED ORDER — POTASSIUM CHLORIDE CRYS ER 20 MEQ PO TBCR
40.0000 meq | EXTENDED_RELEASE_TABLET | Freq: Two times a day (BID) | ORAL | Status: AC
  Administered 2022-07-12 (×2): 40 meq via ORAL
  Filled 2022-07-12 (×2): qty 2

## 2022-07-12 NOTE — Progress Notes (Signed)
PROGRESS NOTE    Tanya Lowe  ZOX:[id number] DOB: [birth year] DOA: 07/10/2022 PCP: Joana Reamer, DO   Brief Narrative:  HPI per Dr. Frankey Shown on 07/10/22 Tanya Lowe is a 51 y.o. female with medical history significant of CAD status post stent placements x 3, COPD, GERD, hypertension, tobacco abuse who presents to the emergency department due to low level of oxygen noted in her PCPs office.  Patient states that she was recently admitted at [place], [place] about 2 weeks ago due to pneumonia, she states that she has not been back to her baseline since being discharged from the hospital.  She followed up with her PCP today and was slated to be hypoxic with O2 sat in the mid 80s, so an EMS was activated and patient was taken to the ED for further evaluation and management.  Patient endorsed a midsternal pressure which occurred just prior to arrival of the EMS team.  She was treated with 1 dose of aspirin and 1 nitroglycerin sublingual by EMS team en route to the hospital with an improvement in the chest pain.  Patient states that she was recently diagnosed with COPD during a recent hospitalization.  She denies fever, chills, nausea, vomiting, headache, blurry vision.    ED Course:  In the emergency department, she was hemodynamically stable on arrival to the ED except being hypoxic with O2 sat of 87%, supplemental oxygen via  at 2 LPM was provided and O2 sats improved to 97%.  Work-up in the ED showed microcytic anemia, BMP was normal, troponin x2 was flat at 6, BNP was 862.  SARS coronavirus 2 was negative. Chest x-ray showed: Central pulmonary vessels are more prominent. Increased interstitial markings are seen in parahilar regions and lower lung fields, more so on the right side. Findings suggest interstitial edema or interstitial pneumonia. There is no focal pulmonary consolidation. There is no pleural effusion. IV Lasix 40 mg x 1 was given.  Hospitalist was asked to admit patient  for further evaluation and management.   **Interim History Patient was given IV Lasix and started diuresing.  Cardiology was notified and echocardiogram was ordered and done.  She remains on supplemental oxygen but notes that her legs are less swollen today feels little bit better.  Echocardiogram showed a normal EF of 65 to 70% with no regional wall motion abnormalities but the right ventricle apex appeared to be thickened more than normally and right ventricular function was different with the systolic function being mild to moderately reduced and the right ventricular size being mildly enlarged.  Because of this the cardiology team was concerned that she had a PE so she underwent a CTA PE scan which was negative for any PE but did show abnormally enlarged lymph nodes in the mediastinum and both hilar regions with possible increase in size with findings suggesting possible active inflammatory or neoplastic process and recommendations for follow-up PET scan and tissue sampling and she also had diffuse groundglass dispensaries seen in both lungs suggesting possible pulmonary edema or multifocal pneumonia with small bilateral pleural effusions more so on the right.  We will continue IV Lasix per cardiology recommendations and try and wean her off of supplemental oxygen.   Assessment and Plan:    Acute respiratory failure with hypoxia in the setting of suspected new onset right ventricular dysfunction and congestive heart failure -Chest x-ray done and was suggestive of interstitial edema and interstitial pneumonia patient was recently treated for pneumonia at [facility] and  doing well [place]; Official read showed "Central pulmonary vessels are more prominent. Increased interstitial markings are seen in parahilar regions and lower lung fields, more so on the right side. Findings suggest interstitial edema or interstitial pneumonia. There is no focal pulmonary consolidation. There is no pleural  effusion." -Was placed on supplemental oxygen due to her oxygen saturations being in the 80s -SpO2: 98 % O2 Flow Rate (L/min): 2 L/min -Continuous pulse oximetry and maintain O2 saturations greater than 90% -Continue supplemental oxygen via nasal cannula wean O2 as tolerated to room air -Had a echocardiogram done previously last year which showed an EF of 65 to 70% with a left ventricular function being normal and had no RWMA but did have mild LVH and left ventricular diastolic parameters were normal at that time -She was initiated on IV Lasix in the ED and will continue IV Lasix 40 mg twice daily today -BNP was elevated at 862.0 on admission and is now 543.0 -We will continue lisinopril -Strict I's and O's and daily weights; she is -2.66 L since admission -Repeat chest x-ray in a.m. and echocardiogram this visit -Placed on a cardiac diet -She will need an ambulatory home O2 screen prior to discharge -Cardiology consulted for further evaluation recommendations and echocardiogram was done and below and cardiology recommended evaluating for PE which was negative -CT scan done and showed diffuse groundglass opacities seen in both lungs suggesting possibly pulmonary edema multifocal pneumonia; unlikely pneumonia given that she is afebrile and has no white count has no cough or sputum production but the CT scan done did show that there is thickening of the interlobular septa in the lower lung fields suggesting possible edema was chronic and she did have small bilateral pleural effusions worse on the right -Cardiology recommends continuing additional diuresis and following labs and they are recommending that she would need a follow-up echo in outpatient setting   Chest pain rule out ACS -Chest pain is likely resolved and she is complaining chest pain resolved after nitroglycerin -She does have a history of CAD with stent placement x3 -Troponin x2 was flat at 6 -Cardiology has been consulted and  appreciate further evaluation recommendations -In the interim we will continue aspirin 81 mg p.o. daily, atorvastatin 80 mg p.o. daily, Toprol tartrate as well as nitroglycerin 0.4 mg and lisinopril 2.5 mg p.o. daily   GERD/GI prophylaxis -Continue with pantoprazole 40 g p.o. daily   Microcytic Anemia -Patient's hemoglobin/hematocrit is now 10.1/35.3 with MCV of 80.4 -Anemia panel was checked and showed an iron level of 20, UIBC 379, TIBC 399, saturation ratios of 5%, ferritin level 41, folate of 13.3 -Continue with ferrous sulfate 325 mg p.o. every other day -Continue monitor for signs and symptoms bleeding; no overt bleeding noted  Lymphadenopathy -She has abnormally enlarged lymph nodes in the mediastinum and both hilar regions with possible increased size concerning for active inflammatory or neoplastic process -Recommending following up with a PET CT scan and tissue sampling this can be done in outpatient setting   Essential hypertension -Continue diuresis IV Lasix 40 mg twice daily, metoprolol tartrate as well as lisinopril -Last blood pressure reading is on the softer side at 92/55  Hypokalemia -Patient's potassium 3.3 -Replete with p.o. KCl 40 mg twice daily x2 doses -Patient's magnesium level is 2.0 -Continue monitor and trend and replete as necessary -Repeat CMP in a.m.   Diabetes Mellitus Type 2 -Hemoglobin A1c is now 5.8 -Continue moderate NovoLog sign scale insulin AC and monitor CBGs per protocol  and adjust insulin regimen as necessary -CBGs ranging from 93-156   COPD -Continue with DuoNeb 3 MLS every 6 as needed for shortness of breath as well as albuterol 3 mL IH every 6 as needed for wheezing or shortness of breath  Hypoalbuminemia -Patient's Albumin Level is now 2.8 -Continue to Monitor and Trend and repeat CMP in the AM    Chronic back pain -Continue with oxycodone IR 10 mg - 325 mg every 8 hours as needed moderate and severe pain along with tizanidine 4 mg  p.o. 3 times daily as well as gabapentin 1200 mg p.o. 3 times daily   Tobacco Abuse -Smoking cessation counseling given -If necessary will place on Nicotine Patch   Obesity -Complicates overall prognosis and care -Estimated body mass index is 30.69 kg/m as calculated from the following:   Height as of this encounter: 5\' 2"  (1.575 m).   Weight as of this encounter: 76.1 kg.  -Weight Loss and Dietary Counseling given  DVT prophylaxis: Enoxaparin 40 mg sq q24h    Code Status: Prior Family Communication: No family present at bedside   Disposition Plan:  Level of care: Telemetry Status is: Inpatient Remains inpatient appropriate because: She is continuing to get diuresis through the IV and needs to be improved and off of oxygen and closer to her dry weight   Consultants:  Cardiology  Procedures:  As delineated as above Echocardiogram IMPRESSIONS     1. Left ventricular ejection fraction, by estimation, is 65 to 70%. The  left ventricle has normal function. The left ventricle has no regional  wall motion abnormalities.   2. RV apex appears to be thickening more normally (? McConnell's sign)      Compared to echo from 2022, RV function is different . Right  ventricular systolic function mild to moderately reduced. The right  ventricular size is mildly enlarged. There is normal pulmonary artery  systolic pressure.   3. Right atrial size was mildly dilated.   4. The mitral valve is normal in structure. Trivial mitral valve  regurgitation.   5. The aortic valve is tricuspid. Aortic valve regurgitation is not  visualized. Aortic valve sclerosis is present, with no evidence of aortic  valve stenosis.   6. The inferior vena cava is dilated in size with >50% respiratory  variability, suggesting right atrial pressure of 8 mmHg.   FINDINGS   Left Ventricle: Left ventricular ejection fraction, by estimation, is 65  to 70%. The left ventricle has normal function. The left ventricle  has no  regional wall motion abnormalities. The left ventricular internal cavity  size was normal in size. There is   no left ventricular hypertrophy.   Right Ventricle: RV apex appears to be thickening more normally (?  McConnell's sign)  Compared to echo from 2022, RV function is different. The right  ventricular size is mildly enlarged. Right vetricular wall thickness was  not assessed. Right ventricular systolic function mild to moderately  reduced. There is normal pulmonary artery  systolic pressure. The tricuspid regurgitant velocity is 2.57 m/s, and  with an assumed right atrial pressure of 8 mmHg, the estimated right  ventricular systolic pressure is 34.4 mmHg.   Left Atrium: Left atrial size was normal in size.   Right Atrium: Right atrial size was mildly dilated.   Pericardium: There is no evidence of pericardial effusion.   Mitral Valve: The mitral valve is normal in structure. Trivial mitral  valve regurgitation. MV peak gradient, 3.5 mmHg. The mean mitral  valve  gradient is 1.0 mmHg.   Tricuspid Valve: The tricuspid valve is normal in structure. Tricuspid  valve regurgitation is mild.   Aortic Valve: The aortic valve is tricuspid. Aortic valve regurgitation is  not visualized. Aortic valve sclerosis is present, with no evidence of  aortic valve stenosis. Aortic valve mean gradient measures 4.0 mmHg.  Aortic valve peak gradient measures 7.5   mmHg. Aortic valve area, by VTI measures 2.14 cm.   Pulmonic Valve: The pulmonic valve was not well visualized. Pulmonic valve  regurgitation is not visualized. No evidence of pulmonic stenosis.   Aorta: The aortic root is normal in size and structure.   Venous: The inferior vena cava is dilated in size with greater than 50%  respiratory variability, suggesting right atrial pressure of 8 mmHg.   IAS/Shunts: No atrial level shunt detected by color flow Doppler.      LEFT VENTRICLE  PLAX 2D  LVIDd:         4.60 cm    Diastology  LVIDs:         2.80 cm   LV e' medial:    5.66 cm/s  LV PW:         0.80 cm   LV E/e' medial:  16.7  LV IVS:        1.00 cm   LV e' lateral:   8.81 cm/s  LVOT diam:     1.90 cm   LV E/e' lateral: 10.7  LV SV:         64  LV SV Index:   36  LVOT Area:     2.84 cm      RIGHT VENTRICLE  RV Basal diam:  3.50 cm  RV Mid diam:    3.60 cm  RV S prime:     11.70 cm/s  TAPSE (M-mode): 2.4 cm   LEFT ATRIUM             Index        RIGHT ATRIUM           Index  LA diam:        4.20 cm 2.34 cm/m   RA Area:     20.70 cm  LA Vol (A2C):   59.0 ml 32.86 ml/m  RA Volume:   63.00 ml  35.08 ml/m  LA Vol (A4C):   46.7 ml 26.01 ml/m  LA Biplane Vol: 54.7 ml 30.46 ml/m   AORTIC VALVE                    PULMONIC VALVE  AV Area (Vmax):    2.26 cm     PV Vmax:       0.92 m/s  AV Area (Vmean):   1.85 cm     PV Peak grad:  3.4 mmHg  AV Area (VTI):     2.14 cm  AV Vmax:           137.00 cm/s  AV Vmean:          94.200 cm/s  AV VTI:            0.298 m  AV Peak Grad:      7.5 mmHg  AV Mean Grad:      4.0 mmHg  LVOT Vmax:         109.00 cm/s  LVOT Vmean:        61.400 cm/s  LVOT VTI:          0.225 m  LVOT/AV VTI ratio: 0.76     AORTA  Ao Root diam: 2.90 cm   MITRAL VALVE               TRICUSPID VALVE  MV Area (PHT): 3.50 cm    TR Peak grad:   26.4 mmHg  MV Area VTI:   2.38 cm    TR Vmax:        257.00 cm/s  MV Peak grad:  3.5 mmHg  MV Mean grad:  1.0 mmHg    SHUNTS  MV Vmax:       0.94 m/s    Systemic VTI:  0.22 m  MV Vmean:      52.1 cm/s   Systemic Diam: 1.90 cm  MV Decel Time: 217 msec  MV E velocity: 94.30 cm/s  MV A velocity: 74.60 cm/s  MV E/A ratio:  1.26   Antimicrobials:  Anti-infectives (From admission, onward)    None       Subjective: Seen and examined at bedside states that she is doing a little bit better and denies any chest pain or shortness of breath.  Thinks that her legs are less swollen today.  Urinating quite frequently.  Denies any nausea or  vomiting.  No other concerns or complaints at this time.  Objective: Vitals:   07/12/22 0415 07/12/22 0500 07/12/22 1241 07/12/22 1242  BP: 116/77  (!) 97/59 (!) 92/55  Pulse: (!) 58  68   Resp: 16  20   Temp: 98.2 F (36.8 C)  98.7 F (37.1 C)   TempSrc: Oral  Oral   SpO2: 93%  97% 98%  Weight:  76.1 kg    Height:        Intake/Output Summary (Last 24 hours) at 07/12/2022 1400 Last data filed at 07/12/2022 1610 Gross per 24 hour  Intake 240 ml  Output 1900 ml  Net -1660 ml   Filed Weights   07/10/22 1712 07/11/22 0640 07/12/22 0500  Weight: 78.9 kg 78.3 kg 76.1 kg   Examination: Physical Exam:  Constitutional: WN/WD obese Caucasian female currently no acute distress Respiratory: Diminished to auscultation bilaterally with coarse breath sounds and some slight crackles, no wheezing, rales, rhonchi. Normal respiratory effort and patient is not tachypenic. No accessory muscle use.  Wearing supplemental oxygen via nasal cannula but has unlabored breathing Cardiovascular: RRR, no murmurs / rubs / gallops. S1 and S2 auscultated.  Has mild 1+ extremity edema now which is improved from yesterday Abdomen: Soft, non-tender, distended secondary to body habitus.  Bowel sounds positive.  GU: Deferred. Musculoskeletal: No clubbing / cyanosis of digits/nails. No joint deformity upper and lower extremities.  Skin: No rashes, lesions, ulcers on limited skin evaluation. No induration; Warm and dry.  Neurologic: CN 2-12 grossly intact with no focal deficits. Romberg sign and cerebellar reflexes not assessed.  Psychiatric: Normal judgment and insight. Alert and oriented x 3. Normal mood and appropriate affect.   Data Reviewed: I have personally reviewed following labs and imaging studies  CBC: Recent Labs  Lab 07/10/22 1752 07/11/22 0435 07/12/22 0630  WBC 8.9 7.4 4.9  NEUTROABS  --   --  3.2  HGB 9.5* 10.4* 10.1*  HCT 33.2* 36.1 35.3*  MCV 79.8* 80.8 80.4  PLT 302 272 250   Basic  Metabolic Panel: Recent Labs  Lab 07/10/22 1752 07/11/22 0435 07/12/22 0630  NA 135 142 141  K 4.4 3.5 3.3*  CL 98 103 102  CO2 27 28 32  GLUCOSE 99 82  89  BUN CREATININE 0.90 0.67 0.68  CALCIUM 8.8* 8.5* 8.7*  MG  --  2.1 2.0  PHOS  --   --  4.8*   GFR: Estimated Creatinine Clearance: 77.7 mL/min (by C-G formula based on SCr of 0.68 mg/dL). Liver Function Tests: Recent Labs  Lab 07/12/22 0630  AST 20  ALT 27  ALKPHOS 82  BILITOT 0.8  PROT 6.1*  ALBUMIN 2.8*   No results for input(s): "LIPASE", "AMYLASE" in the last 168 hours. No results for input(s): "AMMONIA" in the last 168 hours. Coagulation Profile: No results for input(s): "INR", "PROTIME" in the last 168 hours. Cardiac Enzymes: No results for input(s): "CKTOTAL", "CKMB", "CKMBINDEX", "TROPONINI" in the last 168 hours. BNP (last 3 results) No results for input(s): "PROBNP" in the last 8760 hours. HbA1C: Recent Labs    07/11/22 0435  HGBA1C 5.8*   CBG: Recent Labs  Lab 07/11/22 1119 07/11/22 1714 07/11/22 2031 07/12/22 0730 07/12/22 1131  GLUCAP 165* 107* 104* 93 156*   Lipid Profile: No results for input(s): "CHOL", "HDL", "LDLCALC", "TRIG", "CHOLHDL", "LDLDIRECT" in the last 72 hours. Thyroid Function Tests: No results for input(s): "TSH", "T4TOTAL", "FREET4", "T3FREE", "THYROIDAB" in the last 72 hours. Anemia Panel: Recent Labs    07/11/22 0435  FOLATE 13.3  FERRITIN 41  TIBC 399  IRON 20*   Sepsis Labs: No results for input(s): "PROCALCITON", "LATICACIDVEN" in the last 168 hours.  Recent Results (from the past 240 hour(s))  SARS Coronavirus 2 by RT PCR (hospital order, performed in [facility] hospital lab) *cepheid single result test* Anterior Nasal Swab     Status: None   Collection Time: 07/10/22  6:45 PM   Specimen: Anterior Nasal Swab  Result Value Ref Range Status   SARS Coronavirus 2 by RT PCR NEGATIVE NEGATIVE Final    Comment: (NOTE) SARS-CoV-2 target nucleic acids  are NOT DETECTED.  The SARS-CoV-2 RNA is generally detectable in upper and lower respiratory specimens during the acute phase of infection. The lowest concentration of SARS-CoV-2 viral copies this assay can detect is 250 copies / mL. A negative result does not preclude SARS-CoV-2 infection and should not be used as the sole basis for treatment or other patient management decisions.  A negative result may occur with improper specimen collection / handling, submission of specimen other than nasopharyngeal swab, presence of viral mutation(s) within the areas targeted by this assay, and inadequate number of viral copies (<250 copies / mL). A negative result must be combined with clinical observations, patient history, and epidemiological information.  Fact Sheet for Patients:   RoadLapTop.co.za  Fact Sheet for Healthcare Providers: http://kim-miller.com/  This test is not yet approved or  cleared by the [place] FDA and has been authorized for detection and/or diagnosis of SARS-CoV-2 by FDA under an Emergency Use Authorization (EUA).  This EUA will remain in effect (meaning this test can be used) for the duration of the COVID-19 declaration under Section 564(b)(1) of the Act, 21 U.S.C. section 360bbb-3(b)(1), unless the authorization is terminated or revoked sooner.  Performed at [facility], [address], [place], [place] [address]      Radiology Studies: DG CHEST PORT 1 VIEW  Result Date: 07/12/2022 CLINICAL DATA:  Pneumonia over 2 weeks ago. Shortness of breath. Chest pain. EXAM: PORTABLE CHEST 1 VIEW COMPARISON:  July 10, 2022 FINDINGS: No pneumothorax. The cardiomediastinal silhouette is stable. Increasing opacities in the bases, right greater than left. Probable right effusion. No other  interval changes. No pneumothorax. IMPRESSION: Increasing bibasilar opacities, right greater than left. Probable associated right effusion.  The opacity in the right base could be compressive atelectasis due to the effusion. Developing bibasilar pneumonia not excluded on this study. Recommend clinical correlation and attention on follow-up. Electronically Signed   By: Gerome Sam III M.D.   On: 07/12/2022 08:34   CT Angio Chest Pulmonary Embolism (PE) W or WO Contrast  Result Date: 07/11/2022 CLINICAL DATA:  Chest pain EXAM: CT ANGIOGRAPHY CHEST WITH CONTRAST TECHNIQUE: Multidetector CT imaging of the chest was performed using the standard protocol during bolus administration of intravenous contrast. Multiplanar CT image reconstructions and MIPs were obtained to evaluate the vascular anatomy. RADIATION DOSE REDUCTION: This exam was performed according to the departmental dose-optimization program which includes automated exposure control, adjustment of the mA and/or kV according to patient size and/or use of iterative reconstruction technique. CONTRAST:  75mL OMNIPAQUE IOHEXOL 350 MG/ML SOLN COMPARISON:  Previous CT done on 06/19/2022 and chest radiographs including the examination done on 07/10/2022 FINDINGS: Cardiovascular: There is homogeneous enhancement in thoracic aorta. Scattered calcifications are seen in thoracic aorta and its major branches including the coronary arteries. There are no intraluminal filling defects in pulmonary artery branches. Mediastinum/Nodes: There are abnormally enlarged lymph nodes in mediastinum and both hilar regions. Mediastinal lymph nodes measure up to 2.1 cm in short axis. There is possible slight increase in size of lymph nodes. Possibility of active inflammatory or neoplastic process is not excluded. Lungs/Pleura: Extensive faint ground-glass densities are noted in both lungs. There is thickening of interlobular septi. There are small patchy infiltrates in lingula and both lower lobes, more so in right lower lobe. Small bilateral pleural effusions are seen, more so on the right side. There is no  pneumothorax. Upper Abdomen: There is reflux of contrast into hepatic veins suggesting tricuspid incompetence. There is no dilation of bile ducts. Gallbladder is contracted. Musculoskeletal: There is neurostimulator lead in the thoracic spinal canal. No acute findings are seen. Review of the MIP images confirms the above findings. IMPRESSION: There is no evidence of pulmonary artery embolism. There is no evidence of thoracic aortic dissection. Coronary artery disease. There are abnormally enlarged lymph nodes in mediastinum and both hilar regions with possible increase in size. Findings suggest possible active inflammatory or neoplastic process. Follow-up PET-CT and tissue sampling as clinically warranted may be considered. Diffuse ground-glass densities are seen in both lungs suggesting possible pulmonary edema or multifocal pneumonia. Part of this finding may suggest underlying scarring. There is thickening of interlobular septi in the lower lung fields suggesting possible edema or scarring. Small bilateral pleural effusions, more so on the right side. Electronically Signed   By: Ernie Avena M.D.   On: 07/11/2022 17:54   ECHOCARDIOGRAM COMPLETE  Result Date: 07/11/2022    ECHOCARDIOGRAM REPORT   Patient Name:   Tanya Lowe Date of Exam: 07/11/2022 Medical Rec #:  [id number]    Height:       62.0 in Accession #:    [id number]   Weight:       172.6 lb Date of Birth:  November 18, 1970     BSA:          1.796 m Patient Age:    51 years     BP:           134/71 mmHg Patient Gender: F            HR:  70 bpm. [facility] Procedure: 2D Echo, Cardiac Doppler and Color Doppler Indications:    CHF  History:        Patient has prior history of Echocardiogram examinations, most                 recent 11/19/2020. CHF, Previous Myocardial Infarction and CAD,                 COPD, Signs/Symptoms:Chest Pain; Risk Factors:Hypertension,                 Diabetes and Current Smoker.  Sonographer:    Mikki Harbororothy  Buchanan Referring Phys: [id number] OLADAPO ADEFESO IMPRESSIONS  1. Left ventricular ejection fraction, by estimation, is 65 to 70%. The left ventricle has normal function. The left ventricle has no regional wall motion abnormalities.  2. RV apex appears to be thickening more normally (? McConnell's sign)     Compared to echo from 2022, RV function is different . Right ventricular systolic function mild to moderately reduced. The right ventricular size is mildly enlarged. There is normal pulmonary artery systolic pressure.  3. Right atrial size was mildly dilated.  4. The mitral valve is normal in structure. Trivial mitral valve regurgitation.  5. The aortic valve is tricuspid. Aortic valve regurgitation is not visualized. Aortic valve sclerosis is present, with no evidence of aortic valve stenosis.  6. The inferior vena cava is dilated in size with >50% respiratory variability, suggesting right atrial pressure of 8 mmHg. FINDINGS  Left Ventricle: Left ventricular ejection fraction, by estimation, is 65 to 70%. The left ventricle has normal function. The left ventricle has no regional wall motion abnormalities. The left ventricular internal cavity size was normal in size. There is  no left ventricular hypertrophy. Right Ventricle: RV apex appears to be thickening more normally (? McConnell's sign) Compared to echo from 2022, RV function is different. The right ventricular size is mildly enlarged. Right vetricular wall thickness was not assessed. Right ventricular systolic function mild to moderately reduced. There is normal pulmonary artery systolic pressure. The tricuspid regurgitant velocity is 2.57 m/s, and with an assumed right atrial pressure of 8 mmHg, the estimated right ventricular systolic pressure is 34.4 mmHg. Left Atrium: Left atrial size was normal in size. Right Atrium: Right atrial size was mildly dilated. Pericardium: There is no evidence of pericardial effusion. Mitral Valve: The mitral valve is normal  in structure. Trivial mitral valve regurgitation. MV peak gradient, 3.5 mmHg. The mean mitral valve gradient is 1.0 mmHg. Tricuspid Valve: The tricuspid valve is normal in structure. Tricuspid valve regurgitation is mild. Aortic Valve: The aortic valve is tricuspid. Aortic valve regurgitation is not visualized. Aortic valve sclerosis is present, with no evidence of aortic valve stenosis. Aortic valve mean gradient measures 4.0 mmHg. Aortic valve peak gradient measures 7.5  mmHg. Aortic valve area, by VTI measures 2.14 cm. Pulmonic Valve: The pulmonic valve was not well visualized. Pulmonic valve regurgitation is not visualized. No evidence of pulmonic stenosis. Aorta: The aortic root is normal in size and structure. Venous: The inferior vena cava is dilated in size with greater than 50% respiratory variability, suggesting right atrial pressure of 8 mmHg. IAS/Shunts: No atrial level shunt detected by color flow Doppler.  LEFT VENTRICLE PLAX 2D LVIDd:         4.60 cm   Diastology LVIDs:         2.80 cm   LV e' medial:    5.66 cm/s LV PW:  0.80 cm   LV E/e' medial:  16.7 LV IVS:        1.00 cm   LV e' lateral:   8.81 cm/s LVOT diam:     1.90 cm   LV E/e' lateral: 10.7 LV SV:         64 LV SV Index:   36 LVOT Area:     2.84 cm  RIGHT VENTRICLE RV Basal diam:  3.50 cm RV Mid diam:    3.60 cm RV S prime:     11.70 cm/s TAPSE (M-mode): 2.4 cm LEFT ATRIUM             Index        RIGHT ATRIUM           Index LA diam:        4.20 cm 2.34 cm/m   RA Area:     20.70 cm LA Vol (A2C):   59.0 ml 32.86 ml/m  RA Volume:   63.00 ml  35.08 ml/m LA Vol (A4C):   46.7 ml 26.01 ml/m LA Biplane Vol: 54.7 ml 30.46 ml/m  AORTIC VALVE                    PULMONIC VALVE AV Area (Vmax):    2.26 cm     PV Vmax:       0.92 m/s AV Area (Vmean):   1.85 cm     PV Peak grad:  3.4 mmHg AV Area (VTI):     2.14 cm AV Vmax:           137.00 cm/s AV Vmean:          94.200 cm/s AV VTI:            0.298 m AV Peak Grad:      7.5 mmHg AV Mean  Grad:      4.0 mmHg LVOT Vmax:         109.00 cm/s LVOT Vmean:        61.400 cm/s LVOT VTI:          0.225 m LVOT/AV VTI ratio: 0.76  AORTA Ao Root diam: 2.90 cm MITRAL VALVE               TRICUSPID VALVE MV Area (PHT): 3.50 cm    TR Peak grad:   26.4 mmHg MV Area VTI:   2.38 cm    TR Vmax:        257.00 cm/s MV Peak grad:  3.5 mmHg MV Mean grad:  1.0 mmHg    SHUNTS MV Vmax:       0.94 m/s    Systemic VTI:  0.22 m MV Vmean:      52.1 cm/s   Systemic Diam: 1.90 cm MV Decel Time: 217 msec MV E velocity: 94.30 cm/s MV A velocity: 74.60 cm/s MV E/A ratio:  1.26 Dorris Carnes MD Electronically signed by Dorris Carnes MD Signature Date/Time: 07/11/2022/4:36:10 PM    Final    DG Chest 2 View  Result Date: 07/10/2022 CLINICAL DATA:  Chest pain EXAM: CHEST - 2 VIEW COMPARISON:  07/30/2015 FINDINGS: Transverse diameter of heart is increased. Central pulmonary vessels are more prominent. Increased interstitial markings are seen in parahilar regions and lower lung fields. There is no focal pulmonary consolidation. There is no pleural effusion or pneumothorax. Pain control lead is seen in thoracic spinal canal. IMPRESSION: Central pulmonary vessels are more prominent. Increased interstitial markings are seen in parahilar regions and  lower lung fields, more so on the right side. Findings suggest interstitial edema or interstitial pneumonia. There is no focal pulmonary consolidation. There is no pleural effusion. Electronically Signed   By: Ernie Avena M.D.   On: 07/10/2022 18:21    Scheduled Meds:  aspirin EC  81 mg Oral Daily   atorvastatin  80 mg Oral q1800   ferrous sulfate  325 mg Oral QODAY   furosemide  40 mg Intravenous Q12H   gabapentin  1,200 mg Oral TID   insulin aspart  0-15 Units Subcutaneous TID WC   lisinopril  2.5 mg Oral Daily   metoprolol tartrate  25 mg Oral BID   pantoprazole  40 mg Oral Daily   potassium chloride  40 mEq Oral BID   tiZANidine  4 mg Oral TID   Continuous Infusions:    LOS: 2 days   Marguerita Merles, DO [facility] Available via Epic secure chat 7am-7pm After these hours, please refer to coverage provider listed on [email or web address] 07/12/2022, 2:00 PM

## 2022-07-13 ENCOUNTER — Inpatient Hospital Stay (HOSPITAL_COMMUNITY): Payer: Medicare Other

## 2022-07-13 DIAGNOSIS — I2510 Atherosclerotic heart disease of native coronary artery without angina pectoris: Secondary | ICD-10-CM | POA: Diagnosis not present

## 2022-07-13 DIAGNOSIS — R7989 Other specified abnormal findings of blood chemistry: Secondary | ICD-10-CM | POA: Diagnosis not present

## 2022-07-13 DIAGNOSIS — I509 Heart failure, unspecified: Secondary | ICD-10-CM | POA: Diagnosis not present

## 2022-07-13 DIAGNOSIS — J449 Chronic obstructive pulmonary disease, unspecified: Secondary | ICD-10-CM | POA: Diagnosis not present

## 2022-07-13 LAB — COMPREHENSIVE METABOLIC PANEL
ALT: 22 U/L (ref 0–44)
AST: 16 U/L (ref 15–41)
Albumin: 3 g/dL — ABNORMAL LOW (ref 3.5–5.0)
Alkaline Phosphatase: 76 U/L (ref 38–126)
Anion gap: 7 (ref 5–15)
BUN: 10 mg/dL (ref 6–20)
CO2: 31 mmol/L (ref 22–32)
Calcium: 9 mg/dL (ref 8.9–10.3)
Chloride: 103 mmol/L (ref 98–111)
Creatinine, Ser: 0.65 mg/dL (ref 0.44–1.00)
GFR, Estimated: >60 mL/min (ref >60)
Glucose, Bld: 98 mg/dL (ref 70–99)
Potassium: 3.8 mmol/L (ref 3.5–5.1)
Sodium: 141 mmol/L (ref 135–145)
Total Bilirubin: 0.7 mg/dL (ref 0.3–1.2)
Total Protein: 6.5 g/dL (ref 6.5–8.1)

## 2022-07-13 LAB — GLUCOSE, CAPILLARY
Glucose-Capillary: 97 mg/dL (ref 70–99)
Glucose-Capillary: 149 mg/dL — ABNORMAL HIGH (ref 70–99)
Glucose-Capillary: 132 mg/dL — ABNORMAL HIGH (ref 70–99)
Glucose-Capillary: 135 mg/dL — ABNORMAL HIGH (ref 70–99)

## 2022-07-13 LAB — CBC WITH DIFFERENTIAL/PLATELET
Abs Immature Granulocytes: 0.02 10*3/uL (ref 0.00–0.07)
Basophils Absolute: 0 10*3/uL (ref 0.0–0.1)
Basophils Relative: 0 %
Eosinophils Absolute: 0 10*3/uL (ref 0.0–0.5)
Eosinophils Relative: 0 %
HCT: 37.2 % (ref 36.0–46.0)
Hemoglobin: 10.5 g/dL — ABNORMAL LOW (ref 12.0–15.0)
Immature Granulocytes: 0 %
Lymphocytes Relative: 37 %
Lymphs Abs: 1.8 10*3/uL (ref 0.7–4.0)
MCH: 22.5 pg — ABNORMAL LOW (ref 26.0–34.0)
MCHC: 28.2 g/dL — ABNORMAL LOW (ref 30.0–36.0)
MCV: 79.8 fL — ABNORMAL LOW (ref 80.0–100.0)
Monocytes Absolute: 0.3 10*3/uL (ref 0.1–1.0)
Monocytes Relative: 6 %
Neutro Abs: 2.8 10*3/uL (ref 1.7–7.7)
Neutrophils Relative %: 57 %
Platelets: 237 10*3/uL (ref 150–400)
RBC: 4.66 MIL/uL (ref 3.87–5.11)
RDW: 20.8 % — ABNORMAL HIGH (ref 11.5–15.5)
WBC: 5 10*3/uL (ref 4.0–10.5)
nRBC: 0 % (ref 0.0–0.2)

## 2022-07-13 LAB — BRAIN NATRIURETIC PEPTIDE: B Natriuretic Peptide: 345 pg/mL — ABNORMAL HIGH (ref 0.0–100.0)

## 2022-07-13 LAB — PHOSPHORUS: Phosphorus: 4.7 mg/dL — ABNORMAL HIGH (ref 2.5–4.6)

## 2022-07-13 LAB — MAGNESIUM: Magnesium: 2 mg/dL (ref 1.7–2.4)

## 2022-07-13 MED ORDER — POLYETHYLENE GLYCOL 3350 17 G PO PACK
17.0000 g | PACK | Freq: Two times a day (BID) | ORAL | Status: DC
  Administered 2022-07-13 (×2): 17 g via ORAL
  Filled 2022-07-13 (×3): qty 1

## 2022-07-13 MED ORDER — GUAIFENESIN ER 600 MG PO TB12
1200.0000 mg | ORAL_TABLET | Freq: Two times a day (BID) | ORAL | Status: DC
  Administered 2022-07-13 – 2022-07-14 (×3): 1200 mg via ORAL
  Filled 2022-07-13 (×3): qty 2

## 2022-07-13 MED ORDER — SENNOSIDES-DOCUSATE SODIUM 8.6-50 MG PO TABS
1.0000 | ORAL_TABLET | Freq: Two times a day (BID) | ORAL | Status: DC
  Administered 2022-07-13 – 2022-07-14 (×3): 1 via ORAL
  Filled 2022-07-13 (×3): qty 1

## 2022-07-13 NOTE — Progress Notes (Signed)
SATURATION QUALIFICATIONS:  Patient Saturations on Room Air at Rest = 83-88%  Patient Saturations on Room Air while Ambulating = 78-85%  Pt denies SOB/ Dizziness. Advise pt to put o2 back on  DO stated she need another day for diuresis and reevaluate on O2 tomorrow.

## 2022-07-13 NOTE — Progress Notes (Signed)
PROGRESS NOTE    Tanya Lowe  WUJ:[id number] DOB: 10/30/1970 DOA: 07/10/2022 PCP: Tanya Lowe   Brief Narrative:  HPI per Tanya Lowe on 07/10/22 Tanya Lowe is a 51 y.o. female with medical history significant of CAD status post stent placements x 3, COPD, GERD, hypertension, tobacco abuse who presents to the emergency department due to low level of oxygen noted in her PCPs office.  Patient states that she was recently admitted at [place] [place] about 2 weeks ago due to pneumonia, she states that she has not been back to her baseline since being discharged from the hospital.  She followed up with her PCP today and was slated to be hypoxic with O2 sat in the mid 80s, so an EMS was activated and patient was taken to the ED for further evaluation and management.  Patient endorsed a midsternal pressure which occurred just prior to arrival of the EMS team.  She was treated with 1 dose of aspirin and 1 nitroglycerin sublingual by EMS team en route to the hospital with an improvement in the chest pain.  Patient states that she was recently diagnosed with COPD during a recent hospitalization.  She denies fever, chills, nausea, vomiting, headache, blurry vision.    ED Course:  In the emergency department, she was hemodynamically stable on arrival to the ED except being hypoxic with O2 sat of 87%, supplemental oxygen via [place] at 2 LPM was provided and O2 sats improved to 97%.  Work-up in the ED showed microcytic anemia, BMP was normal, troponin x2 was flat at 6, BNP was 862.  SARS coronavirus 2 was negative. Chest x-ray showed: Central pulmonary vessels are more prominent. Increased interstitial markings are seen in parahilar regions and lower lung fields, more so on the right side. Findings suggest interstitial edema or interstitial pneumonia. There is no focal pulmonary consolidation. There is no pleural effusion. IV Lasix 40 mg x 1 was given.  Hospitalist was asked to admit patient  for further evaluation and management.   **Interim History Patient was given IV Lasix and started diuresing.  Cardiology was notified and echocardiogram was ordered and done.  She remains on supplemental oxygen but notes that her legs are less swollen today feels little bit better.  Echocardiogram showed a normal EF of 65 to 70% with no regional wall motion abnormalities but the right ventricle apex appeared to be thickened more than normally and right ventricular function was different with the systolic function being mild to moderately reduced and the right ventricular size being mildly enlarged.  Because of this the cardiology team was concerned that she had a PE so she underwent a CTA PE scan which was negative for any PE but did show abnormally enlarged lymph nodes in the mediastinum and both hilar regions with possible increase in size with findings suggesting possible active inflammatory or neoplastic process and recommendations for follow-up PET scan and tissue sampling and she also had diffuse groundglass dispensaries seen in both lungs suggesting possible pulmonary edema or multifocal pneumonia with small bilateral pleural effusions more so on the right.   We will continue IV Lasix per cardiology recommendations and try and wean her off of supplemental oxygen.   Assessment and Plan: No notes have been filed under this hospital service. Service: Hospitalist  Acute respiratory failure with hypoxia in the setting of suspected new onset right ventricular dysfunction and congestive heart failure -Chest x-ray done and was suggestive of interstitial edema and interstitial pneumonia  patient was recently treated for pneumonia at [facility] health and doing well [place]; Official read showed "Central pulmonary vessels are more prominent. Increased interstitial markings are seen in parahilar regions and lower lung fields, more so on the right side. Findings suggest interstitial edema or interstitial pneumonia.  There is no focal pulmonary consolidation. There is no pleural effusion." -Was placed on supplemental oxygen due to her oxygen saturations being in the 80s -SpO2: 90 % O2 Flow Rate (L/min): 3 L/min -Continuous pulse oximetry and maintain O2 saturations greater than 90% -Continue supplemental oxygen via nasal cannula wean O2 as tolerated to room air -Had a echocardiogram done previously last year which showed an EF of 65 to 70% with a left ventricular function being normal and had no RWMA but did have mild LVH and left ventricular diastolic parameters were normal at that time -She was initiated on IV Lasix in the ED and will continue IV Lasix 40 mg twice daily today -BNP was elevated at 862.0 on admission and trending down and was 543.0 and is now 345.0 -We will continue lisinopril -Strict I's and O's and daily weights; she is -3.32 L since admission and weight is down 13 lbs  -Repeat chest x-ray in a.m. and echocardiogram this visit -Placed on a cardiac diet -She will need an ambulatory home O2 screen prior to discharge -Cardiology consulted for further evaluation recommendations and echocardiogram was done and below and cardiology recommended evaluating for PE which was negative -CT scan done and showed diffuse groundglass opacities seen in both lungs suggesting possibly pulmonary edema multifocal pneumonia; unlikely pneumonia given that she is afebrile and has no white count has no cough or sputum production but the CT scan done did show that there is thickening of the interlobular septa in the lower lung fields suggesting possible edema was chronic and she did have small bilateral pleural effusions worse on the right -Cardiology recommends continuing additional diuresis and following labs and they are recommending that she would need a follow-up echo in outpatient setting -Repeat chest x-ray done and showed "The right pleural effusion and underlying opacity are no longer visualized. Improving but  persistent opacity in the left base. No other change."   Chest pain rule out ACS -Chest pain is likely resolved and she is complaining chest pain resolved after nitroglycerin -She does have a history of CAD with stent placement x3 -Troponin x2 was flat at 6 -Cardiology has been consulted and appreciate further evaluation recommendations -In the interim we will continue aspirin 81 mg p.o. daily, atorvastatin 80 mg p.o. daily, Toprol tartrate as well as nitroglycerin 0.4 mg and lisinopril 2.5 mg p.o. daily   GERD/GI prophylaxis -Continue with pantoprazole 40 g p.o. daily   Microcytic Anemia -Patient's hemoglobin/hematocrit is now 10.1/35.3 with MCV of 80.4 -Anemia panel was checked and showed an iron level of 20, UIBC 379, TIBC 399, saturation ratios of 5%, ferritin level 41, folate of 13.3 -Continue with ferrous sulfate 325 mg p.o. every other day -Continue monitor for signs and symptoms bleeding; no overt bleeding noted   Lymphadenopathy -She has abnormally enlarged lymph nodes in the mediastinum and both hilar regions with possible increased size concerning for active inflammatory or neoplastic process -Recommending following up with a PET CT scan and tissue sampling this can be done in outpatient setting   Essential Hypertension -Continue diuresis IV Lasix 40 mg twice daily, metoprolol tartrate as well as lisinopril -Last blood pressure reading is on the softer side at 113/71   Hypokalemia -  Patient's potassium 3.3 -Replete with p.o. KCl 40 mg twice daily x2 doses -Patient's magnesium level is 2.0 -Continue monitor and trend and replete as necessary -Repeat CMP in a.m.   Diabetes Mellitus Type 2 -Hemoglobin A1c is now 5.8 -Continue moderate NovoLog sign scale insulin AC and monitor CBGs per protocol and adjust insulin regimen as necessary -CBGs ranging from 97-186   COPD -Continue with DuoNeb 3 MLS every 6 as needed for shortness of breath as well as albuterol 3 mL IH every 6  as needed for wheezing or shortness of breath -Currently not in exacerbation but did desaturate on home ambulatory screen and will likely require home supplemental oxygen for discharge -Currently not wheezing   Hypoalbuminemia -Patient's Albumin Level is now 2.8 -> 3.0 -Continue to Monitor and Trend and repeat CMP in the AM    Chronic back pain -Continue with oxycodone IR 10 mg - 325 mg every 8 hours as needed moderate and severe pain along with tizanidine 4 mg p.o. 3 times daily as well as gabapentin 1200 mg p.o. 3 times daily   Tobacco Abuse -Smoking cessation counseling given -If necessary will place on Nicotine Patch however she declines nicotine patch and nicotine gum   Overweight -Complicates overall prognosis and care -Estimated body mass index is 29.56 kg/m as calculated from the following:   Height as of this encounter: 5\' 2"  (1.575 m).   Weight as of this encounter: 73.3 kg.  -Weight Loss and Dietary Counseling given  DVT prophylaxis: enoxaparin (LOVENOX) injection 40 mg Start: 07/12/22 1600    Code Status: Prior Family Communication: No family currently at bedside  Disposition Plan:  Level of care: Telemetry Status is: Inpatient Remains inpatient appropriate because: No family present at bedside   Consultants:  Cardiology  Procedures:  ECHOCARDIOGRAM IMPRESSIONS     1. Left ventricular ejection fraction, by estimation, is 65 to 70%. The  left ventricle has normal function. The left ventricle has no regional  wall motion abnormalities.   2. RV apex appears to be thickening more normally (? McConnell's sign)      Compared to echo from 2022, RV function is different . Right  ventricular systolic function mild to moderately reduced. The right  ventricular size is mildly enlarged. There is normal pulmonary artery  systolic pressure.   3. Right atrial size was mildly dilated.   4. The mitral valve is normal in structure. Trivial mitral valve  regurgitation.    5. The aortic valve is tricuspid. Aortic valve regurgitation is not  visualized. Aortic valve sclerosis is present, with no evidence of aortic  valve stenosis.   6. The inferior vena cava is dilated in size with >50% respiratory  variability, suggesting right atrial pressure of 8 mmHg.   FINDINGS   Left Ventricle: Left ventricular ejection fraction, by estimation, is 65  to 70%. The left ventricle has normal function. The left ventricle has no  regional wall motion abnormalities. The left ventricular internal cavity  size was normal in size. There is   no left ventricular hypertrophy.   Right Ventricle: RV apex appears to be thickening more normally (?  McConnell's sign)  Compared to echo from 2022, RV function is different. The right  ventricular size is mildly enlarged. Right vetricular wall thickness was  not assessed. Right ventricular systolic function mild to moderately  reduced. There is normal pulmonary artery  systolic pressure. The tricuspid regurgitant velocity is 2.57 m/s, and  with an assumed right atrial pressure of 8  mmHg, the estimated right  ventricular systolic pressure is 34.4 mmHg.   Left Atrium: Left atrial size was normal in size.   Right Atrium: Right atrial size was mildly dilated.   Pericardium: There is no evidence of pericardial effusion.   Mitral Valve: The mitral valve is normal in structure. Trivial mitral  valve regurgitation. MV peak gradient, 3.5 mmHg. The mean mitral valve  gradient is 1.0 mmHg.   Tricuspid Valve: The tricuspid valve is normal in structure. Tricuspid  valve regurgitation is mild.   Aortic Valve: The aortic valve is tricuspid. Aortic valve regurgitation is  not visualized. Aortic valve sclerosis is present, with no evidence of  aortic valve stenosis. Aortic valve mean gradient measures 4.0 mmHg.  Aortic valve peak gradient measures 7.5   mmHg. Aortic valve area, by VTI measures 2.14 cm.   Pulmonic Valve: The pulmonic valve  was not well visualized. Pulmonic valve  regurgitation is not visualized. No evidence of pulmonic stenosis.   Aorta: The aortic root is normal in size and structure.   Venous: The inferior vena cava is dilated in size with greater than 50%  respiratory variability, suggesting right atrial pressure of 8 mmHg.   IAS/Shunts: No atrial level shunt detected by color flow Doppler.      LEFT VENTRICLE  PLAX 2D  LVIDd:         4.60 cm   Diastology  LVIDs:         2.80 cm   LV e' medial:    5.66 cm/s  LV PW:         0.80 cm   LV E/e' medial:  16.7  LV IVS:        1.00 cm   LV e' lateral:   8.81 cm/s  LVOT diam:     1.90 cm   LV E/e' lateral: 10.7  LV SV:         64  LV SV Index:   36  LVOT Area:     2.84 cm      RIGHT VENTRICLE  RV Basal diam:  3.50 cm  RV Mid diam:    3.60 cm  RV S prime:     11.70 cm/s  TAPSE (M-mode): 2.4 cm   LEFT ATRIUM             Index        RIGHT ATRIUM           Index  LA diam:        4.20 cm 2.34 cm/m   RA Area:     20.70 cm  LA Vol (A2C):   59.0 ml 32.86 ml/m  RA Volume:   63.00 ml  35.08 ml/m  LA Vol (A4C):   46.7 ml 26.01 ml/m  LA Biplane Vol: 54.7 ml 30.46 ml/m   AORTIC VALVE                    PULMONIC VALVE  AV Area (Vmax):    2.26 cm     PV Vmax:       0.92 m/s  AV Area (Vmean):   1.85 cm     PV Peak grad:  3.4 mmHg  AV Area (VTI):     2.14 cm  AV Vmax:           137.00 cm/s  AV Vmean:          94.200 cm/s  AV VTI:  0.298 m  AV Peak Grad:      7.5 mmHg  AV Mean Grad:      4.0 mmHg  LVOT Vmax:         109.00 cm/s  LVOT Vmean:        61.400 cm/s  LVOT VTI:          0.225 m  LVOT/AV VTI ratio: 0.76     AORTA  Ao Root diam: 2.90 cm   MITRAL VALVE               TRICUSPID VALVE  MV Area (PHT): 3.50 cm    TR Peak grad:   26.4 mmHg  MV Area VTI:   2.38 cm    TR Vmax:        257.00 cm/s  MV Peak grad:  3.5 mmHg  MV Mean grad:  1.0 mmHg    SHUNTS  MV Vmax:       0.94 m/s    Systemic VTI:  0.22 m  MV Vmean:      52.1 cm/s    Systemic Diam: 1.90 cm  MV Decel Time: 217 msec  MV E velocity: 94.30 cm/s  MV A velocity: 74.60 cm/s  MV E/A ratio:  1.26   Antimicrobials:  Anti-infectives (From admission, onward)    None       Subjective: Seen and examined at bedside think she is doing better and she is walking with the nurse however she desaturated and was placed back on supplemental oxygen.  She denies any lightheadedness or dizziness or complaints of shortness of breath.  She did admit to having low saturations previously.  Denies any other concerns or complaints at this time and we will continue diuresis today.  Objective: Vitals:   07/12/22 2018 07/12/22 2123 07/13/22 0437 07/13/22 0438  BP: 110/67 111/71 113/71   Pulse: 69 69 63   Resp: Temp: 98.1 F (36.7 C) 98.1 F (36.7 C) 98.1 F (36.7 C)   TempSrc:   Oral   SpO2: 98% 91% 90%   Weight:    73.3 kg  Height:        Intake/Output Summary (Last 24 hours) at 07/13/2022 1024 Last data filed at 07/12/2022 2130 Gross per 24 hour  Intake 240 ml  Output 900 ml  Net -660 ml   Filed Weights   07/11/22 0640 07/12/22 0500 07/13/22 0438  Weight: 78.3 kg 76.1 kg 73.3 kg   Examination: Physical Exam:  Constitutional: WN/WD overweight Caucasian female currently no acute distress ambulating with the nurse Respiratory: Diminished to auscultation bilaterally with coarse breath sounds and has some slight crackles, no wheezing, rales, rhonchi. Normal respiratory effort and patient is not tachypenic. No accessory muscle use.  Unlabored breathing and not wearing supplemental oxygen nasal cannula Cardiovascular: RRR, no murmurs / rubs / gallops. S1 and S2 auscultated.  Has mild lower extremity edema now Abdomen: Soft, non-tender, distended secondary body habitus. Bowel sounds positive.  GU: Deferred. Musculoskeletal: No clubbing / cyanosis of digits/nails. No joint deformity upper and lower extremities. Skin: No rashes, lesions, ulcers on limited skin  evaluation. No induration; Warm and dry.  Neurologic: CN 2-12 grossly intact with no focal deficits. Romberg sign and cerebellar reflexes not assessed.  Psychiatric: Normal judgment and insight. Alert and oriented x 3. Normal mood and appropriate affect.   Data Reviewed: I have personally reviewed following labs and imaging studies  CBC: Recent Labs  Lab 07/10/22 1752 07/11/22 0435 07/12/22  0630 07/13/22 0538  WBC 8.9 7.4 4.9 5.0  NEUTROABS  --   --  3.2 2.8  HGB 9.5* 10.4* 10.1* 10.5*  HCT 33.2* 36.1 35.3* 37.2  MCV 79.8* 80.8 80.4 79.8*  PLT 302 272 250 237   Basic Metabolic Panel: Recent Labs  Lab 07/10/22 1752 07/11/22 0435 07/12/22 0630 07/13/22 0538  NA 135 142 141 141  K 4.4 3.5 3.3* 3.8  CL 98 103 102 103  CO2 27 28 32 31  GLUCOSE 99 82 89 98  BUN 9 7 7 10   CREATININE 0.90 0.67 0.68 0.65  CALCIUM 8.8* 8.5* 8.7* 9.0  MG  --  2.1 2.0 2.0  PHOS  --   --  4.8* 4.7*   GFR: Estimated Creatinine Clearance: 76.3 mL/min (by C-G formula based on SCr of 0.65 mg/dL). Liver Function Tests: Recent Labs  Lab 07/12/22 0630 07/13/22 0538  AST 20 16  ALT 27 22  ALKPHOS 82 76  BILITOT 0.8 0.7  PROT 6.1* 6.5  ALBUMIN 2.8* 3.0*   No results for input(s): "LIPASE", "AMYLASE" in the last 168 hours. No results for input(s): "AMMONIA" in the last 168 hours. Coagulation Profile: No results for input(s): "INR", "PROTIME" in the last 168 hours. Cardiac Enzymes: No results for input(s): "CKTOTAL", "CKMB", "CKMBINDEX", "TROPONINI" in the last 168 hours. BNP (last 3 results) No results for input(s): "PROBNP" in the last 8760 hours. HbA1C: Recent Labs    07/11/22 0435  HGBA1C 5.8*   CBG: Recent Labs  Lab 07/12/22 0730 07/12/22 1131 07/12/22 1638 07/12/22 2126 07/13/22 0821  GLUCAP 93 156* 186* 117* 97   Lipid Profile: No results for input(s): "CHOL", "HDL", "LDLCALC", "TRIG", "CHOLHDL", "LDLDIRECT" in the last 72 hours. Thyroid Function Tests: No results for  input(s): "TSH", "T4TOTAL", "FREET4", "T3FREE", "THYROIDAB" in the last 72 hours. Anemia Panel: Recent Labs    07/11/22 0435  FOLATE 13.3  FERRITIN 41  TIBC 399  IRON 20*   Sepsis Labs: No results for input(s): "PROCALCITON", "LATICACIDVEN" in the last 168 hours.  Recent Results (from the past 240 hour(s))  SARS Coronavirus 2 by RT PCR (hospital order, performed in [facility] hospital lab) *cepheid single result test* Anterior Nasal Swab     Status: None   Collection Time: 07/10/22  6:45 PM   Specimen: Anterior Nasal Swab  Result Value Ref Range Status   SARS Coronavirus 2 by RT PCR NEGATIVE NEGATIVE Final    Comment: (NOTE) SARS-CoV-2 target nucleic acids are NOT DETECTED.  The SARS-CoV-2 RNA is generally detectable in upper and lower respiratory specimens during the acute phase of infection. The lowest concentration of SARS-CoV-2 viral copies this assay can detect is 250 copies / mL. A negative result does not preclude SARS-CoV-2 infection and should not be used as the sole basis for treatment or other patient management decisions.  A negative result may occur with improper specimen collection / handling, submission of specimen other than nasopharyngeal swab, presence of viral mutation(s) within the areas targeted by this assay, and inadequate number of viral copies (<250 copies / mL). A negative result must be combined with clinical observations, patient history, and epidemiological information.  Fact Sheet for Patients:   07/12/22  Fact Sheet for Healthcare Providers: RoadLapTop.co.za  This test is not yet approved or  cleared by the http://kim-miller.com/ FDA and has been authorized for detection and/or diagnosis of SARS-CoV-2 by FDA under an Emergency Use Authorization (EUA).  This EUA will remain in effect (meaning this  test can be used) for the duration of the COVID-19 declaration under Section 564(b)(1) of the Act,  21 U.S.C. section 360bbb-3(b)(1), unless the authorization is terminated or revoked sooner.  Performed at [facility], [address], [place], [place] [address]     Radiology Studies: DG CHEST PORT 1 VIEW  Result Date: 07/13/2022 CLINICAL DATA:  CHF. EXAM: PORTABLE CHEST 1 VIEW COMPARISON:  July 12, 2022 FINDINGS: The right pleural effusion and associated opacity are no longer visualized. Mild opacity in left base is mildly improved. The cardiomediastinal silhouette is stable. No pneumothorax. No nodules or masses. No overt edema. IMPRESSION: 1. The right pleural effusion and underlying opacity are no longer visualized. 2. Improving but persistent opacity in the left base. 3. No other change. Electronically Signed   By: Gerome Sam III M.D.   On: 07/13/2022 08:11   DG CHEST PORT 1 VIEW  Result Date: 07/12/2022 CLINICAL DATA:  Pneumonia over 2 weeks ago. Shortness of breath. Chest pain. EXAM: PORTABLE CHEST 1 VIEW COMPARISON:  July 10, 2022 FINDINGS: No pneumothorax. The cardiomediastinal silhouette is stable. Increasing opacities in the bases, right greater than left. Probable right effusion. No other interval changes. No pneumothorax. IMPRESSION: Increasing bibasilar opacities, right greater than left. Probable associated right effusion. The opacity in the right base could be compressive atelectasis due to the effusion. Developing bibasilar pneumonia not excluded on this study. Recommend clinical correlation and attention on follow-up. Electronically Signed   By: Gerome Sam III M.D.   On: 07/12/2022 08:34   CT Angio Chest Pulmonary Embolism (PE) W or WO Contrast  Result Date: 07/11/2022 CLINICAL DATA:  Chest pain EXAM: CT ANGIOGRAPHY CHEST WITH CONTRAST TECHNIQUE: Multidetector CT imaging of the chest was performed using the standard protocol during bolus administration of intravenous contrast. Multiplanar CT image reconstructions and MIPs were obtained to evaluate the vascular  anatomy. RADIATION DOSE REDUCTION: This exam was performed according to the departmental dose-optimization program which includes automated exposure control, adjustment of the mA and/or kV according to patient size and/or use of iterative reconstruction technique. CONTRAST:  75mL OMNIPAQUE IOHEXOL 350 MG/ML SOLN COMPARISON:  Previous CT done on 06/19/2022 and chest radiographs including the examination done on 07/10/2022 FINDINGS: Cardiovascular: There is homogeneous enhancement in thoracic aorta. Scattered calcifications are seen in thoracic aorta and its major branches including the coronary arteries. There are no intraluminal filling defects in pulmonary artery branches. Mediastinum/Nodes: There are abnormally enlarged lymph nodes in mediastinum and both hilar regions. Mediastinal lymph nodes measure up to 2.1 cm in short axis. There is possible slight increase in size of lymph nodes. Possibility of active inflammatory or neoplastic process is not excluded. Lungs/Pleura: Extensive faint ground-glass densities are noted in both lungs. There is thickening of interlobular septi. There are small patchy infiltrates in lingula and both lower lobes, more so in right lower lobe. Small bilateral pleural effusions are seen, more so on the right side. There is no pneumothorax. Upper Abdomen: There is reflux of contrast into hepatic veins suggesting tricuspid incompetence. There is no dilation of bile ducts. Gallbladder is contracted. Musculoskeletal: There is neurostimulator lead in the thoracic spinal canal. No acute findings are seen. Review of the MIP images confirms the above findings. IMPRESSION: There is no evidence of pulmonary artery embolism. There is no evidence of thoracic aortic dissection. Coronary artery disease. There are abnormally enlarged lymph nodes in mediastinum and both hilar regions with possible increase in size. Findings suggest possible active inflammatory or neoplastic process. Follow-up  PET-CT  and tissue sampling as clinically warranted may be considered. Diffuse ground-glass densities are seen in both lungs suggesting possible pulmonary edema or multifocal pneumonia. Part of this finding may suggest underlying scarring. There is thickening of interlobular septi in the lower lung fields suggesting possible edema or scarring. Small bilateral pleural effusions, more so on the right side. Electronically Signed   By: Elmer Picker M.D.   On: 07/11/2022 17:54   ECHOCARDIOGRAM COMPLETE  Result Date: 07/11/2022    ECHOCARDIOGRAM REPORT   Patient Name:   Tanya Lowe Date of Exam: 07/11/2022 Medical Rec #:  [id number]    Height:       62.0 in Accession #:    [id number]   Weight:       172.6 lb Date of Birth:  04-26-1971     BSA:          1.796 m Patient Age:    2 years     BP:           134/71 mmHg Patient Gender: F            HR:           70 bpm. Exam Location:  [facility] Procedure: 2D Echo, Cardiac Doppler and Color Doppler Indications:    CHF  History:        Patient has prior history of Echocardiogram examinations, most                 recent 11/19/2020. CHF, Previous Myocardial Infarction and CAD,                 COPD, Signs/Symptoms:Chest Pain; Risk Factors:Hypertension,                 Diabetes and Current Smoker.  Sonographer:    Wenda Low Referring Phys: [id number] OLADAPO Lowe IMPRESSIONS  1. Left ventricular ejection fraction, by estimation, is 65 to 70%. The left ventricle has normal function. The left ventricle has no regional wall motion abnormalities.  2. RV apex appears to be thickening more normally (? McConnell's sign)     Compared to echo from 2022, RV function is different . Right ventricular systolic function mild to moderately reduced. The right ventricular size is mildly enlarged. There is normal pulmonary artery systolic pressure.  3. Right atrial size was mildly dilated.  4. The mitral valve is normal in structure. Trivial mitral valve regurgitation.  5. The aortic valve  is tricuspid. Aortic valve regurgitation is not visualized. Aortic valve sclerosis is present, with no evidence of aortic valve stenosis.  6. The inferior vena cava is dilated in size with >50% respiratory variability, suggesting right atrial pressure of 8 mmHg. FINDINGS  Left Ventricle: Left ventricular ejection fraction, by estimation, is 65 to 70%. The left ventricle has normal function. The left ventricle has no regional wall motion abnormalities. The left ventricular internal cavity size was normal in size. There is  no left ventricular hypertrophy. Right Ventricle: RV apex appears to be thickening more normally (? McConnell's sign) Compared to echo from 2022, RV function is different. The right ventricular size is mildly enlarged. Right vetricular wall thickness was not assessed. Right ventricular systolic function mild to moderately reduced. There is normal pulmonary artery systolic pressure. The tricuspid regurgitant velocity is 2.57 m/s, and with an assumed right atrial pressure of 8 mmHg, the estimated right ventricular systolic pressure is 71.2 mmHg. Left Atrium: Left atrial size was normal in size. Right Atrium: Right atrial size  was mildly dilated. Pericardium: There is no evidence of pericardial effusion. Mitral Valve: The mitral valve is normal in structure. Trivial mitral valve regurgitation. MV peak gradient, 3.5 mmHg. The mean mitral valve gradient is 1.0 mmHg. Tricuspid Valve: The tricuspid valve is normal in structure. Tricuspid valve regurgitation is mild. Aortic Valve: The aortic valve is tricuspid. Aortic valve regurgitation is not visualized. Aortic valve sclerosis is present, with no evidence of aortic valve stenosis. Aortic valve mean gradient measures 4.0 mmHg. Aortic valve peak gradient measures 7.5  mmHg. Aortic valve area, by VTI measures 2.14 cm. Pulmonic Valve: The pulmonic valve was not well visualized. Pulmonic valve regurgitation is not visualized. No evidence of pulmonic  stenosis. Aorta: The aortic root is normal in size and structure. Venous: The inferior vena cava is dilated in size with greater than 50% respiratory variability, suggesting right atrial pressure of 8 mmHg. IAS/Shunts: No atrial level shunt detected by color flow Doppler.  LEFT VENTRICLE PLAX 2D LVIDd:         4.60 cm   Diastology LVIDs:         2.80 cm   LV e' medial:    5.66 cm/s LV PW:         0.80 cm   LV E/e' medial:  16.7 LV IVS:        1.00 cm   LV e' lateral:   8.81 cm/s LVOT diam:     1.90 cm   LV E/e' lateral: 10.7 LV SV:         64 LV SV Index:   36 LVOT Area:     2.84 cm  RIGHT VENTRICLE RV Basal diam:  3.50 cm RV Mid diam:    3.60 cm RV S prime:     11.70 cm/s TAPSE (M-mode): 2.4 cm LEFT ATRIUM             Index        RIGHT ATRIUM           Index LA diam:        4.20 cm 2.34 cm/m   RA Area:     20.70 cm LA Vol (A2C):   59.0 ml 32.86 ml/m  RA Volume:   63.00 ml  35.08 ml/m LA Vol (A4C):   46.7 ml 26.01 ml/m LA Biplane Vol: 54.7 ml 30.46 ml/m  AORTIC VALVE                    PULMONIC VALVE AV Area (Vmax):    2.26 cm     PV Vmax:       0.92 m/s AV Area (Vmean):   1.85 cm     PV Peak grad:  3.4 mmHg AV Area (VTI):     2.14 cm AV Vmax:           137.00 cm/s AV Vmean:          94.200 cm/s AV VTI:            0.298 m AV Peak Grad:      7.5 mmHg AV Mean Grad:      4.0 mmHg LVOT Vmax:         109.00 cm/s LVOT Vmean:        61.400 cm/s LVOT VTI:          0.225 m LVOT/AV VTI ratio: 0.76  AORTA Ao Root diam: 2.90 cm MITRAL VALVE  TRICUSPID VALVE MV Area (PHT): 3.50 cm    TR Peak grad:   26.4 mmHg MV Area VTI:   2.38 cm    TR Vmax:        257.00 cm/s MV Peak grad:  3.5 mmHg MV Mean grad:  1.0 mmHg    SHUNTS MV Vmax:       0.94 m/s    Systemic VTI:  0.22 m MV Vmean:      52.1 cm/s   Systemic Diam: 1.90 cm MV Decel Time: 217 msec MV E velocity: 94.30 cm/s MV A velocity: 74.60 cm/s MV E/A ratio:  1.26 Dietrich Pates MD Electronically signed by Dietrich Pates MD Signature Date/Time: 07/11/2022/4:36:10 PM     Final     Scheduled Meds:  aspirin EC  81 mg Oral Daily   atorvastatin  80 mg Oral q1800   enoxaparin (LOVENOX) injection  40 mg Subcutaneous Q24H   ferrous sulfate  325 mg Oral QODAY   furosemide  40 mg Intravenous Q12H   gabapentin  1,200 mg Oral TID   guaiFENesin  1,200 mg Oral BID   insulin aspart  0-15 Units Subcutaneous TID WC   lisinopril  2.5 mg Oral Daily   metoprolol tartrate  25 mg Oral BID   pantoprazole  40 mg Oral Daily   polyethylene glycol  17 g Oral BID   senna-docusate  1 tablet Oral BID   tiZANidine  4 mg Oral TID   Continuous Infusions:   LOS: 3 days   Marguerita Merles, Lowe [facility] Available via Epic secure chat 7am-7pm After these hours, please refer to coverage provider listed on [email or web address] 07/13/2022, 10:24 AM

## 2022-07-14 ENCOUNTER — Inpatient Hospital Stay (HOSPITAL_COMMUNITY): Payer: Medicare Other

## 2022-07-14 DIAGNOSIS — E876 Hypokalemia: Secondary | ICD-10-CM

## 2022-07-14 DIAGNOSIS — I50811 Acute right heart failure: Secondary | ICD-10-CM

## 2022-07-14 DIAGNOSIS — J449 Chronic obstructive pulmonary disease, unspecified: Secondary | ICD-10-CM | POA: Diagnosis not present

## 2022-07-14 DIAGNOSIS — I2510 Atherosclerotic heart disease of native coronary artery without angina pectoris: Secondary | ICD-10-CM | POA: Diagnosis not present

## 2022-07-14 DIAGNOSIS — I5033 Acute on chronic diastolic (congestive) heart failure: Secondary | ICD-10-CM

## 2022-07-14 DIAGNOSIS — R7989 Other specified abnormal findings of blood chemistry: Secondary | ICD-10-CM | POA: Diagnosis not present

## 2022-07-14 DIAGNOSIS — E8809 Other disorders of plasma-protein metabolism, not elsewhere classified: Secondary | ICD-10-CM

## 2022-07-14 LAB — COMPREHENSIVE METABOLIC PANEL
ALT: 16 U/L (ref 0–44)
AST: 14 U/L — ABNORMAL LOW (ref 15–41)
Albumin: 3.1 g/dL — ABNORMAL LOW (ref 3.5–5.0)
Alkaline Phosphatase: 72 U/L (ref 38–126)
Anion gap: 6 (ref 5–15)
BUN: 8 mg/dL (ref 6–20)
CO2: 32 mmol/L (ref 22–32)
Calcium: 9 mg/dL (ref 8.9–10.3)
Chloride: 101 mmol/L (ref 98–111)
Creatinine, Ser: 0.68 mg/dL (ref 0.44–1.00)
GFR, Estimated: >60 mL/min (ref >60)
Glucose, Bld: 99 mg/dL (ref 70–99)
Potassium: 3.8 mmol/L (ref 3.5–5.1)
Sodium: 139 mmol/L (ref 135–145)
Total Bilirubin: 0.7 mg/dL (ref 0.3–1.2)
Total Protein: 6.5 g/dL (ref 6.5–8.1)

## 2022-07-14 LAB — CBC WITH DIFFERENTIAL/PLATELET
Abs Immature Granulocytes: 0.02 10*3/uL (ref 0.00–0.07)
Basophils Absolute: 0 10*3/uL (ref 0.0–0.1)
Basophils Relative: 0 %
Eosinophils Absolute: 0 10*3/uL (ref 0.0–0.5)
Eosinophils Relative: 0 %
HCT: 38.3 % (ref 36.0–46.0)
Hemoglobin: 10.9 g/dL — ABNORMAL LOW (ref 12.0–15.0)
Immature Granulocytes: 0 %
Lymphocytes Relative: 35 %
Lymphs Abs: 2 10*3/uL (ref 0.7–4.0)
MCH: 22.8 pg — ABNORMAL LOW (ref 26.0–34.0)
MCHC: 28.5 g/dL — ABNORMAL LOW (ref 30.0–36.0)
MCV: 80.1 fL (ref 80.0–100.0)
Monocytes Absolute: 0.4 10*3/uL (ref 0.1–1.0)
Monocytes Relative: 6 %
Neutro Abs: 3.4 10*3/uL (ref 1.7–7.7)
Neutrophils Relative %: 59 %
Platelets: 227 10*3/uL (ref 150–400)
RBC: 4.78 MIL/uL (ref 3.87–5.11)
RDW: 20.6 % — ABNORMAL HIGH (ref 11.5–15.5)
WBC: 5.9 10*3/uL (ref 4.0–10.5)
nRBC: 0 % (ref 0.0–0.2)

## 2022-07-14 LAB — BRAIN NATRIURETIC PEPTIDE: B Natriuretic Peptide: 201 pg/mL — ABNORMAL HIGH (ref 0.0–100.0)

## 2022-07-14 LAB — GLUCOSE, CAPILLARY
Glucose-Capillary: 108 mg/dL — ABNORMAL HIGH (ref 70–99)
Glucose-Capillary: 177 mg/dL — ABNORMAL HIGH (ref 70–99)

## 2022-07-14 LAB — PHOSPHORUS: Phosphorus: 4.3 mg/dL (ref 2.5–4.6)

## 2022-07-14 LAB — MAGNESIUM: Magnesium: 2 mg/dL (ref 1.7–2.4)

## 2022-07-14 MED ORDER — FUROSEMIDE 20 MG PO TABS: 20.0000 mg | ORAL_TABLET | Freq: Every day | ORAL | 0 refills | Status: DC

## 2022-07-14 MED ORDER — GUAIFENESIN ER 600 MG PO TB12: 600.0000 mg | ORAL_TABLET | Freq: Two times a day (BID) | ORAL | 0 refills | Status: AC

## 2022-07-14 MED ORDER — POLYETHYLENE GLYCOL 3350 17 G PO PACK: 17.0000 g | PACK | Freq: Two times a day (BID) | ORAL | 0 refills | Status: DC

## 2022-07-14 MED ORDER — SENNOSIDES-DOCUSATE SODIUM 8.6-50 MG PO TABS: 1.0000 | ORAL_TABLET | Freq: Every day | ORAL | 0 refills | Status: DC

## 2022-07-14 MED ORDER — ORAL CARE MOUTH RINSE: 15.0000 mL | OROMUCOSAL | Status: DC | PRN

## 2022-07-14 NOTE — Plan of Care (Signed)

## 2022-07-14 NOTE — Care Management Important Message (Signed)
Important Message  Patient Details  Name: Tanya Lowe MRN: [id number] Date of Birth: 10-Oct-1971   Medicare Important Message Given:  Yes     Tommy Medal 07/14/2022, 11:49 AM

## 2022-07-14 NOTE — Discharge Summary (Signed)
Physician Discharge Summary   Patient: Tanya Lowe MRN: [id number] DOB: 03/07/1971  Admit date:     07/10/2022  Discharge date: 07/15/22  Discharge Physician: Raiford Noble, DO    PCP: Danna Hefty, DO   Recommendations at discharge:   Follow-up with PCP within 1 to 2 weeks and repeat CBC, CMP, mag, Phos within 1 week Follow-up with cardiology within 1 to 2 weeks Follow-up and evaluate for outpatient  mediastinal lymphadenopathy with either PET scan and tissue biopsy  Discharge Diagnoses: Principal Problem:   Congestive heart failure (CHF) ([place]) Active Problems:   Obesity   Tobacco abuse   Essential hypertension   Elevated brain natriuretic peptide (BNP) level   Microcytic anemia   GERD (gastroesophageal reflux disease)   Type 2 diabetes mellitus (HCC)   CAD (coronary artery disease)   COPD (chronic obstructive pulmonary disease) (HCC)   Hypoalbuminemia   Lymphadenopathy   Mediastinal lymphadenopathy   Hilar lymphadenopathy   Hypokalemia   Chronic back pain  Resolved Problems:   * No resolved hospital problems. [facility] Course: HPI per Dr. Bernadette Hoit on 07/10/22 Tanya Lowe is a 51 y.o. female with medical history significant of CAD status post stent placements x 3, COPD, GERD, hypertension, tobacco abuse who presents to the emergency department due to low level of oxygen noted in her PCPs office.  Patient states that she was recently admitted at [place], [place] about 2 weeks ago due to pneumonia, she states that she has not been back to her baseline since being discharged from the hospital.  She followed up with her PCP today and was slated to be hypoxic with O2 sat in the mid 80s, so an EMS was activated and patient was taken to the ED for further evaluation and management.  Patient endorsed a midsternal pressure which occurred just prior to arrival of the EMS team.  She was treated with 1 dose of aspirin and 1 nitroglycerin sublingual by EMS team en  route to the hospital with an improvement in the chest pain.  Patient states that she was recently diagnosed with COPD during a recent hospitalization.  She denies fever, chills, nausea, vomiting, headache, blurry vision.    ED Course:  In the emergency department, she was hemodynamically stable on arrival to the ED except being hypoxic with O2 sat of 87%, supplemental oxygen via  at 2 LPM was provided and O2 sats improved to 97%.  Work-up in the ED showed microcytic anemia, BMP was normal, troponin x2 was flat at 6, BNP was 862.  SARS coronavirus 2 was negative. Chest x-ray showed: Central pulmonary vessels are more prominent. Increased interstitial markings are seen in parahilar regions and lower lung fields, more so on the right side. Findings suggest interstitial edema or interstitial pneumonia. There is no focal pulmonary consolidation. There is no pleural effusion. IV Lasix 40 mg x 1 was given.  Hospitalist was asked to admit patient for further evaluation and management.   **Interim History Patient was given IV Lasix and started diuresing.  Cardiology was notified and echocardiogram was ordered and done.  She remains on supplemental oxygen but notes that her legs are less swollen today feels little bit better.  Echocardiogram showed a normal EF of 65 to 70% with no regional wall motion abnormalities but the right ventricle apex appeared to be thickened more than normally and right ventricular function was different with the systolic function being mild to moderately reduced and the right ventricular size  being mildly enlarged.  Because of this the cardiology team was concerned that she had a PE so she underwent a CTA PE scan which was negative for any PE but did show abnormally enlarged lymph nodes in the mediastinum and both hilar regions with possible increase in size with findings suggesting possible active inflammatory or neoplastic process and recommendations for follow-up PET scan and tissue  sampling and she also had diffuse groundglass dispensaries seen in both lungs suggesting possible pulmonary edema or multifocal pneumonia with small bilateral pleural effusions more so on the right.   We will continue IV Lasix per cardiology recommendations and try and wean her off of supplemental oxygen.  Cardiology felt that she is stable from their perspective and recommended changing diuresis to p.o.  Unfortunately she is not able to be weaned off of supplemental oxygen so we will discharge her home on 2 L supplemental oxygen she will need to follow-up with PCP, pulmonary and cardiology outpatient setting.  She is much more euvolemic and stable for discharge at this time  Assessment and Plan:  Acute respiratory failure with hypoxia in the setting of suspected new onset right ventricular dysfunction and congestive heart failure, stable -Chest x-ray done and was suggestive of interstitial edema and interstitial pneumonia patient was recently treated for pneumonia at [facility] health and doing well [place]; Official read showed "Central pulmonary vessels are more prominent. Increased interstitial markings are seen in parahilar regions and lower lung fields, more so on the right side. Findings suggest interstitial edema or interstitial pneumonia. There is no focal pulmonary consolidation. There is no pleural effusion." -Was placed on supplemental oxygen due to her oxygen saturations being in the 80s -SpO2: 98 % O2 Flow Rate (L/min): 3 L/min -Continuous pulse oximetry and maintain O2 saturations greater than 90% -Continue supplemental oxygen via nasal cannula wean O2 as tolerated to room air -Had a echocardiogram done previously last year which showed an EF of 65 to 70% with a left ventricular function being normal and had no RWMA but did have mild LVH and left ventricular diastolic parameters were normal at that time -She was initiated on IV Lasix in the ED and will continue IV Lasix 40 mg twice daily  today -BNP was elevated at 862.0 on admission and trending down and was 543.0 and is now 345.0 yesterday and now 201 at the time of discharge -We will continue lisinopril -Strict I's and O's and daily weights; she is -3.08 L since admission and weight is down 13 lbs  -Repeat chest x-ray in a.m. and echocardiogram this visit -Placed on a cardiac diet -She will need an ambulatory home O2 screen prior to discharge and she desaturated so she will require 2 L of supplemental oxygen -Cardiology consulted for further evaluation recommendations and echocardiogram was done and below and cardiology recommended evaluating for PE which was negative -CT scan done and showed diffuse groundglass opacities seen in both lungs suggesting possibly pulmonary edema multifocal pneumonia; unlikely pneumonia given that she is afebrile and has no white count has no cough or sputum production but the CT scan done did show that there is thickening of the interlobular septa in the lower lung fields suggesting possible edema was chronic and she did have small bilateral pleural effusions worse on the right -Cardiology recommends continuing additional diuresis and following labs and they are recommending that she would need a follow-up echo in outpatient setting -Repeat chest x-ray done and showed "Improved LEFT basilar lung aeration with some persistent opacities  likely reflecting atelectasis." -Repeat chest x-ray in the outpatient setting   Chest pain rule out ACS -Chest pain is likely resolved and she is complaining chest pain resolved after nitroglycerin -She does have a history of CAD with stent placement x3 -Troponin x2 was flat at 6 -Cardiology has been consulted and appreciate further evaluation recommendations -In the interim we will continue aspirin 81 mg p.o. daily, atorvastatin 80 mg p.o. daily, Toprol tartrate as well as nitroglycerin 0.4 mg and lisinopril 2.5 mg p.o. daily -Follow-up with cardiology in outpatient  setting   GERD/GI prophylaxis -Continue with pantoprazole 40 g p.o. daily   Microcytic Anemia -Patient's hemoglobin/hematocrit is now 10.1/35.3 with MCV of 80.4 yesterday and today is now 10.9/38.3 with an MCV of 80.1 -Anemia panel was checked and showed an iron level of 20, UIBC 379, TIBC 399, saturation ratios of 5%, ferritin level 41, folate of 13.3 -Continue with ferrous sulfate 325 mg p.o. every other day -Continue monitor for signs and symptoms bleeding; no overt bleeding noted -Repeat CBC within 1 week   Lymphadenopathy -She has abnormally enlarged lymph nodes in the mediastinum and both hilar regions with possible increased size concerning for active inflammatory or neoplastic process -Recommending following up with a PET CT scan and tissue sampling this can be done in outpatient setting   Essential Hypertension -Continue diuresis IV Lasix 40 mg twice daily, metoprolol tartrate as well as lisinopril -Last blood pressure reading is on the softer side at 113/71   Hypokalemia -Patient's potassium 3.8 -Patient's magnesium level is 2.0 -Continue monitor and trend and replete as necessary -Repeat CMP in a.m.   Diabetes Mellitus Type 2 -Hemoglobin A1c is now 5.8 -Continue moderate NovoLog sign scale insulin AC and monitor CBGs per protocol and adjust insulin regimen as necessary -CBGs ranging from 108-177   COPD -Continue with DuoNeb 3 MLS every 6 as needed for shortness of breath as well as albuterol 3 mL IH every 6 as needed for wheezing or shortness of breath -Currently not in exacerbation but did desaturate on home ambulatory screen and will likely require home supplemental oxygen for discharge -Currently not wheezing and improved   Hypoalbuminemia -Patient's Albumin Level is now 2.8 -> 3.0 and is now 3.1 -Continue to Monitor and Trend and repeat CMP in the AM    Chronic back pain -Continue with oxycodone IR 10 mg - 325 mg every 8 hours as needed moderate and severe pain  along with tizanidine 4 mg p.o. 3 times daily as well as gabapentin 1200 mg p.o. 3 times daily   Tobacco Abuse -Smoking cessation counseling given -If necessary will place on Nicotine Patch however she declines nicotine patch and nicotine gum   Obesity -Complicates overall prognosis and care -Estimated body mass index is 30.56 kg/m as calculated from the following:   Height as of this encounter: 5\' 2"  (1.575 m).   Weight as of this encounter: 75.8 kg.  -Weight Loss and Dietary Counseling given  Consultants: Cardiology  Procedures performed: ECHOCardiogram  Disposition: Home Diet recommendation:  Discharge Diet Orders (From admission, onward)     Start     Ordered   07/14/22 0000  Diet - low sodium heart healthy        07/14/22 1440           Cardiac diet DISCHARGE MEDICATION: Allergies as of 07/14/2022       Reactions   Lyrica [pregabalin] Shortness Of Breath   Chest Pain    Bactrim [sulfamethoxazole-trimethoprim] Rash  Prednisone Palpitations, Hypertension           Medication List     STOP taking these medications    pantoprazole 40 MG tablet Commonly known as: PROTONIX       TAKE these medications    acetaminophen 500 MG tablet Commonly known as: TYLENOL Take 1,000 mg by mouth every 6 (six) hours as needed for mild pain.   albuterol 108 (90 Base) MCG/ACT inhaler Commonly known as: VENTOLIN HFA Inhale 1-2 puffs into the lungs every 6 (six) hours as needed for wheezing or shortness of breath.   aspirin EC 81 MG tablet Take 81 mg by mouth daily.   atorvastatin 80 MG tablet Commonly known as: LIPITOR Take 1 tablet (80 mg total) by mouth daily at 6 PM.   busPIRone 7.5 MG tablet Commonly known as: BUSPAR Take 15 mg by mouth 3 (three) times daily.   DULoxetine 60 MG capsule Commonly known as: CYMBALTA Take 60 mg by mouth daily. Take with 30 mg to equal 90 mg   DULoxetine 30 MG capsule Commonly known as: CYMBALTA Take 30 mg by mouth daily.  Take with 60 mg to equal 90 mg   FeroSul 325 (65 FE) MG tablet Generic drug: ferrous sulfate Take 325 mg by mouth every other day.   fexofenadine 180 MG tablet Commonly known as: ALLEGRA Take 180 mg by mouth daily.   fluticasone 50 MCG/ACT nasal spray Commonly known as: FLONASE Place 2 sprays into both nostrils daily as needed for allergies.   furosemide 20 MG tablet Commonly known as: Lasix Take 1 tablet (20 mg total) by mouth daily.   gabapentin 600 MG tablet Commonly known as: NEURONTIN Take 1,200 mg by mouth 3 (three) times daily.   guaiFENesin 600 MG 12 hr tablet Commonly known as: MUCINEX Take 1 tablet (600 mg total) by mouth 2 (two) times daily for 5 days.   ipratropium-albuterol 0.5-2.5 (3) MG/3ML Soln Commonly known as: DUONEB Take 3 mLs by nebulization every 6 (six) hours as needed (shortness of breath).   lisinopril 2.5 MG tablet Commonly known as: ZESTRIL Take 1 tablet (2.5 mg total) by mouth daily.   metFORMIN 500 MG tablet Commonly known as: GLUCOPHAGE Take 500 mg by mouth daily with breakfast.   metoprolol tartrate 25 MG tablet Commonly known as: LOPRESSOR Take 1 tablet (25 mg total) by mouth 2 (two) times daily.   nitroGLYCERIN 0.4 MG SL tablet Commonly known as: NITROSTAT Place 1 tablet (0.4 mg total) under the tongue every 5 (five) minutes as needed for chest pain.   omeprazole 20 MG capsule Commonly known as: PRILOSEC Take 20 mg by mouth daily.   oxyCODONE-acetaminophen 10-325 MG tablet Commonly known as: PERCOCET Take 1 tablet by mouth every 8 (eight) hours as needed for pain.   polyethylene glycol 17 g packet Commonly known as: MIRALAX / GLYCOLAX Take 17 g by mouth 2 (two) times daily.   senna-docusate 8.6-50 MG tablet Commonly known as: Senokot-S Take 1 tablet by mouth at bedtime.   tiZANidine 4 MG tablet Commonly known as: ZANAFLEX Take 4 mg by mouth 3 (three) times daily.        Discharge Exam: Filed Weights   07/12/22  0500 07/13/22 0438 07/14/22 0500  Weight: 76.1 kg 73.3 kg 75.8 kg   Vitals:   07/14/22 0433 07/14/22 1429  BP: 114/78 (!) 135/111  Pulse: 65 74  Resp: 14 20  Temp: (!) 97.2 F (36.2 C) 98.1 F (36.7 C)  SpO2: 95%  98%   Examination: Physical Exam:  Constitutional: WN/WD obese Caucasian female currently no acute distress appears calm Respiratory: Diminished to auscultation bilaterally with coarse breath sounds slightly worse on the left compared to right, no wheezing, rales, rhonchi or crackles. Normal respiratory effort and patient is not tachypenic. No accessory muscle use.  Wearing supplemental oxygen via nasal cannula Cardiovascular: RRR, no murmurs / rubs / gallops. S1 and S2 auscultated.  1+ lower extremity edema Abdomen: Soft, non-tender, stented secondary body habitus.  Bowel sounds positive.  GU: Deferred. Musculoskeletal: No clubbing / cyanosis of digits/nails. No joint deformity upper and lower extremities.  Skin: No rashes, lesions, ulcers on limited skin evaluation. No induration; Warm and dry.  Neurologic: CN 2-12 grossly intact with no focal deficits. Romberg sign and cerebellar reflexes not assessed.  Psychiatric: Normal judgment and insight. Alert and oriented x 3. Normal mood and appropriate affect.   Condition at discharge: stable  The results of significant diagnostics from this hospitalization (including imaging, microbiology, ancillary and laboratory) are listed below for reference.   Imaging Studies: DG CHEST PORT 1 VIEW  Result Date: 07/14/2022 CLINICAL DATA:  [id number] EXAM: PORTABLE CHEST 1 VIEW COMPARISON:  July 13, 2022 FINDINGS: The cardiomediastinal silhouette is unchanged in contour.Atherosclerotic calcifications. No pleural effusion. No pneumothorax. Improved LEFT lung aeration with mild persistent linear opacities likely reflecting atelectasis. Visualized abdomen is unremarkable. Spinal stimulator. IMPRESSION: Improved LEFT basilar lung aeration with  some persistent opacities likely reflecting atelectasis. Electronically Signed   By: Valentino Saxon M.D.   On: 07/14/2022 10:01   DG CHEST PORT 1 VIEW  Result Date: 07/13/2022 CLINICAL DATA:  CHF. EXAM: PORTABLE CHEST 1 VIEW COMPARISON:  July 12, 2022 FINDINGS: The right pleural effusion and associated opacity are no longer visualized. Mild opacity in left base is mildly improved. The cardiomediastinal silhouette is stable. No pneumothorax. No nodules or masses. No overt edema. IMPRESSION: 1. The right pleural effusion and underlying opacity are no longer visualized. 2. Improving but persistent opacity in the left base. 3. No other change. Electronically Signed   By: Dorise Bullion III M.D.   On: 07/13/2022 08:11   DG CHEST PORT 1 VIEW  Result Date: 07/12/2022 CLINICAL DATA:  Pneumonia over 2 weeks ago. Shortness of breath. Chest pain. EXAM: PORTABLE CHEST 1 VIEW COMPARISON:  July 10, 2022 FINDINGS: No pneumothorax. The cardiomediastinal silhouette is stable. Increasing opacities in the bases, right greater than left. Probable right effusion. No other interval changes. No pneumothorax. IMPRESSION: Increasing bibasilar opacities, right greater than left. Probable associated right effusion. The opacity in the right base could be compressive atelectasis due to the effusion. Developing bibasilar pneumonia not excluded on this study. Recommend clinical correlation and attention on follow-up. Electronically Signed   By: Dorise Bullion III M.D.   On: 07/12/2022 08:34   CT Angio Chest Pulmonary Embolism (PE) W or WO Contrast  Result Date: 07/11/2022 CLINICAL DATA:  Chest pain EXAM: CT ANGIOGRAPHY CHEST WITH CONTRAST TECHNIQUE: Multidetector CT imaging of the chest was performed using the standard protocol during bolus administration of intravenous contrast. Multiplanar CT image reconstructions and MIPs were obtained to evaluate the vascular anatomy. RADIATION DOSE REDUCTION: This exam was  performed according to the departmental dose-optimization program which includes automated exposure control, adjustment of the mA and/or kV according to patient size and/or use of iterative reconstruction technique. CONTRAST:  69mL OMNIPAQUE IOHEXOL 350 MG/ML SOLN COMPARISON:  Previous CT done on 06/19/2022 and chest radiographs including the examination done on 07/10/2022  FINDINGS: Cardiovascular: There is homogeneous enhancement in thoracic aorta. Scattered calcifications are seen in thoracic aorta and its major branches including the coronary arteries. There are no intraluminal filling defects in pulmonary artery branches. Mediastinum/Nodes: There are abnormally enlarged lymph nodes in mediastinum and both hilar regions. Mediastinal lymph nodes measure up to 2.1 cm in short axis. There is possible slight increase in size of lymph nodes. Possibility of active inflammatory or neoplastic process is not excluded. Lungs/Pleura: Extensive faint ground-glass densities are noted in both lungs. There is thickening of interlobular septi. There are small patchy infiltrates in lingula and both lower lobes, more so in right lower lobe. Small bilateral pleural effusions are seen, more so on the right side. There is no pneumothorax. Upper Abdomen: There is reflux of contrast into hepatic veins suggesting tricuspid incompetence. There is no dilation of bile ducts. Gallbladder is contracted. Musculoskeletal: There is neurostimulator lead in the thoracic spinal canal. No acute findings are seen. Review of the MIP images confirms the above findings. IMPRESSION: There is no evidence of pulmonary artery embolism. There is no evidence of thoracic aortic dissection. Coronary artery disease. There are abnormally enlarged lymph nodes in mediastinum and both hilar regions with possible increase in size. Findings suggest possible active inflammatory or neoplastic process. Follow-up PET-CT and tissue sampling as clinically warranted may be  considered. Diffuse ground-glass densities are seen in both lungs suggesting possible pulmonary edema or multifocal pneumonia. Part of this finding may suggest underlying scarring. There is thickening of interlobular septi in the lower lung fields suggesting possible edema or scarring. Small bilateral pleural effusions, more so on the right side. Electronically Signed   By: Elmer Picker M.D.   On: 07/11/2022 17:54   ECHOCARDIOGRAM COMPLETE  Result Date: 07/11/2022    ECHOCARDIOGRAM REPORT   Patient Name:   Tanya Lowe Arduini Date of Exam: 07/11/2022 Medical Rec #:  [id number]    Height:       62.0 in Accession #:    [id number]   Weight:       172.6 lb Date of Birth:  04/27/1971     BSA:          1.796 m Patient Age:    74 years     BP:           134/71 mmHg Patient Gender: F            HR:           70 bpm. Exam Location:  [facility] Procedure: 2D Echo, Cardiac Doppler and Color Doppler Indications:    CHF  History:        Patient has prior history of Echocardiogram examinations, most                 recent 11/19/2020. CHF, Previous Myocardial Infarction and CAD,                 COPD, Signs/Symptoms:Chest Pain; Risk Factors:Hypertension,                 Diabetes and Current Smoker.  Sonographer:    Wenda Low Referring Phys: [id number] OLADAPO ADEFESO IMPRESSIONS  1. Left ventricular ejection fraction, by estimation, is 65 to 70%. The left ventricle has normal function. The left ventricle has no regional wall motion abnormalities.  2. RV apex appears to be thickening more normally (? McConnell's sign)     Compared to echo from 2022, RV function is different . Right ventricular systolic function mild to moderately  reduced. The right ventricular size is mildly enlarged. There is normal pulmonary artery systolic pressure.  3. Right atrial size was mildly dilated.  4. The mitral valve is normal in structure. Trivial mitral valve regurgitation.  5. The aortic valve is tricuspid. Aortic valve regurgitation is not  visualized. Aortic valve sclerosis is present, with no evidence of aortic valve stenosis.  6. The inferior vena cava is dilated in size with >50% respiratory variability, suggesting right atrial pressure of 8 mmHg. FINDINGS  Left Ventricle: Left ventricular ejection fraction, by estimation, is 65 to 70%. The left ventricle has normal function. The left ventricle has no regional wall motion abnormalities. The left ventricular internal cavity size was normal in size. There is  no left ventricular hypertrophy. Right Ventricle: RV apex appears to be thickening more normally (? McConnell's sign) Compared to echo from 2022, RV function is different. The right ventricular size is mildly enlarged. Right vetricular wall thickness was not assessed. Right ventricular systolic function mild to moderately reduced. There is normal pulmonary artery systolic pressure. The tricuspid regurgitant velocity is 2.57 m/s, and with an assumed right atrial pressure of 8 mmHg, the estimated right ventricular systolic pressure is 58.5 mmHg. Left Atrium: Left atrial size was normal in size. Right Atrium: Right atrial size was mildly dilated. Pericardium: There is no evidence of pericardial effusion. Mitral Valve: The mitral valve is normal in structure. Trivial mitral valve regurgitation. MV peak gradient, 3.5 mmHg. The mean mitral valve gradient is 1.0 mmHg. Tricuspid Valve: The tricuspid valve is normal in structure. Tricuspid valve regurgitation is mild. Aortic Valve: The aortic valve is tricuspid. Aortic valve regurgitation is not visualized. Aortic valve sclerosis is present, with no evidence of aortic valve stenosis. Aortic valve mean gradient measures 4.0 mmHg. Aortic valve peak gradient measures 7.5  mmHg. Aortic valve area, by VTI measures 2.14 cm. Pulmonic Valve: The pulmonic valve was not well visualized. Pulmonic valve regurgitation is not visualized. No evidence of pulmonic stenosis. Aorta: The aortic root is normal in size and  structure. Venous: The inferior vena cava is dilated in size with greater than 50% respiratory variability, suggesting right atrial pressure of 8 mmHg. IAS/Shunts: No atrial level shunt detected by color flow Doppler.  LEFT VENTRICLE PLAX 2D LVIDd:         4.60 cm   Diastology LVIDs:         2.80 cm   LV e' medial:    5.66 cm/s LV PW:         0.80 cm   LV E/e' medial:  16.7 LV IVS:        1.00 cm   LV e' lateral:   8.81 cm/s LVOT diam:     1.90 cm   LV E/e' lateral: 10.7 LV SV:         64 LV SV Index:   36 LVOT Area:     2.84 cm  RIGHT VENTRICLE RV Basal diam:  3.50 cm RV Mid diam:    3.60 cm RV S prime:     11.70 cm/s TAPSE (M-mode): 2.4 cm LEFT ATRIUM             Index        RIGHT ATRIUM           Index LA diam:        4.20 cm 2.34 cm/m   RA Area:     20.70 cm LA Vol (A2C):   59.0 ml 32.86 ml/m  RA Volume:  63.00 ml  35.08 ml/m LA Vol (A4C):   46.7 ml 26.01 ml/m LA Biplane Vol: 54.7 ml 30.46 ml/m  AORTIC VALVE                    PULMONIC VALVE AV Area (Vmax):    2.26 cm     PV Vmax:       0.92 m/s AV Area (Vmean):   1.85 cm     PV Peak grad:  3.4 mmHg AV Area (VTI):     2.14 cm AV Vmax:           137.00 cm/s AV Vmean:          94.200 cm/s AV VTI:            0.298 m AV Peak Grad:      7.5 mmHg AV Mean Grad:      4.0 mmHg LVOT Vmax:         109.00 cm/s LVOT Vmean:        61.400 cm/s LVOT VTI:          0.225 m LVOT/AV VTI ratio: 0.76  AORTA Ao Root diam: 2.90 cm MITRAL VALVE               TRICUSPID VALVE MV Area (PHT): 3.50 cm    TR Peak grad:   26.4 mmHg MV Area VTI:   2.38 cm    TR Vmax:        257.00 cm/s MV Peak grad:  3.5 mmHg MV Mean grad:  1.0 mmHg    SHUNTS MV Vmax:       0.94 m/s    Systemic VTI:  0.22 m MV Vmean:      52.1 cm/s   Systemic Diam: 1.90 cm MV Decel Time: 217 msec MV E velocity: 94.30 cm/s MV A velocity: 74.60 cm/s MV E/A ratio:  1.26 Dorris Carnes MD Electronically signed by Dorris Carnes MD Signature Date/Time: 07/11/2022/4:36:10 PM    Final    DG Chest 2 View  Result Date:  07/10/2022 CLINICAL DATA:  Chest pain EXAM: CHEST - 2 VIEW COMPARISON:  07/30/2015 FINDINGS: Transverse diameter of heart is increased. Central pulmonary vessels are more prominent. Increased interstitial markings are seen in parahilar regions and lower lung fields. There is no focal pulmonary consolidation. There is no pleural effusion or pneumothorax. Pain control lead is seen in thoracic spinal canal. IMPRESSION: Central pulmonary vessels are more prominent. Increased interstitial markings are seen in parahilar regions and lower lung fields, more so on the right side. Findings suggest interstitial edema or interstitial pneumonia. There is no focal pulmonary consolidation. There is no pleural effusion. Electronically Signed   By: Elmer Picker M.D.   On: 07/10/2022 18:21   CT CHEST W CONTRAST  Result Date: 06/20/2022 CLINICAL DATA:  Follow-up lung nodule. F/u LDCT, Current smoker, 30 pack year history, asymptomatic EXAM: CT CHEST WITH CONTRAST TECHNIQUE: Multidetector CT imaging of the chest was performed during intravenous contrast administration. RADIATION DOSE REDUCTION: This exam was performed according to the departmental dose-optimization program which includes automated exposure control, adjustment of the mA and/or kV according to patient size and/or use of iterative reconstruction technique. CONTRAST:  76mL OMNIPAQUE IOHEXOL 300 MG/ML  SOLN COMPARISON:  Low-dose lung cancer screening CT, 01/14/2022. FINDINGS: Cardiovascular: Heart is normal in size. No pericardial effusion. Three-vessel coronary artery calcifications. Great vessels are normal in caliber. Mild aortic atherosclerosis. Mediastinum/Nodes: Multiple enlarged mediastinal and hilar lymph nodes, overall increased from prior  CT. Two adjacent pre vascular nodes are noted, up to 1.6 cm in short axis, previously 1.2 cm. Right paratracheal lymph node, 1.5 cm in short axis, previously 1 cm. Hilar nodes better visualized due to the presence of  intravenous contrast. Superior right hilar node, 1.7 cm short axis. Posteromedial left hilar lymph node, 1.5 cm in short axis. Anterior superior left hilar lymph node, 1.3 cm in short axis. Normal thyroid. No neck base or axillary masses or enlarged lymph nodes. Trachea and esophagus are unremarkable. Lungs/Pleura: Trace right pleural effusion. Small irregular nodule in the superior segment of the right lower lobe, image 64, series 4, is stable, 7-8 mm. 2-3 mm peripheral pleural base nodule, left upper lobe, image 55, series 4, also unchanged. Interstitial thickening with peripheral reticulation most evident in the anterior upper lobes. More diffuse interstitial thickening noted bilaterally, most evident in the lower lungs. There are areas of relative lucency, most prominent in the lower lungs, consistent with mosaic perfusion and suspected to be from small airways disease/air trapping. Mild centrilobular emphysema. Anterior upper lobe interstitial thickening and reticulation has increased compared to the prior CT. No pneumothorax. Upper Abdomen: No acute findings. Musculoskeletal: No fracture or acute finding. No bone lesion. No chest wall mass. Stable midthoracic spine spine stimulator leads. IMPRESSION: 1. Previously described small lung nodules are unchanged. 2. Interstitial thickening and upper lobe predominant interstitial reticulation increased the prior CT has has mediastinal and hilar lymphadenopathy. Findings suspicious for a progressive interstitial pneumonia. Recommend an additional follow-up chest CT, to reassess the nodules in addition to the interstitial changes and lymphadenopathy, in 6 months. 3. Mosaic perfusion suspected to be due to small airways disease. 4. Trace right pleural effusion, new from the prior CT. Given presence of interstitial thickening, component of mild interstitial pulmonary edema should considered. 5. Three-vessel coronary artery calcifications and aortic atherosclerosis. Fall  Aortic Atherosclerosis (ICD10-I70.0) and Emphysema (ICD10-J43.9). Electronically Signed   By: Lajean Manes M.D.   On: 06/20/2022 14:11    Microbiology: Results for orders placed or performed during the hospital encounter of 07/10/22  SARS Coronavirus 2 by RT PCR (hospital order, performed in [facility] hospital lab) *cepheid single result test* Anterior Nasal Swab     Status: None   Collection Time: 07/10/22  6:45 PM   Specimen: Anterior Nasal Swab  Result Value Ref Range Status   SARS Coronavirus 2 by RT PCR NEGATIVE NEGATIVE Final    Comment: (NOTE) SARS-CoV-2 target nucleic acids are NOT DETECTED.  The SARS-CoV-2 RNA is generally detectable in upper and lower respiratory specimens during the acute phase of infection. The lowest concentration of SARS-CoV-2 viral copies this assay can detect is 250 copies / mL. A negative result does not preclude SARS-CoV-2 infection and should not be used as the sole basis for treatment or other patient management decisions.  A negative result may occur with improper specimen collection / handling, submission of specimen other than nasopharyngeal swab, presence of viral mutation(s) within the areas targeted by this assay, and inadequate number of viral copies (<250 copies / mL). A negative result must be combined with clinical observations, patient history, and epidemiological information.  Fact Sheet for Patients:   https://www.patel.info/  Fact Sheet for Healthcare Providers: https://hall.com/  This test is not yet approved or  cleared by the [place] FDA and has been authorized for detection and/or diagnosis of SARS-CoV-2 by FDA under an Emergency Use Authorization (EUA).  This EUA will remain in effect (meaning this test can be  used) for the duration of the COVID-19 declaration under Section 564(b)(1) of the Act, 21 U.S.C. section 360bbb-3(b)(1), unless the authorization is terminated  or revoked sooner.  Performed at [facility], [address], [place], [place] [address]     Labs: CBC: Recent Labs  Lab 07/10/22 1752 07/11/22 0435 07/12/22 0630 07/13/22 0538 07/14/22 0638  WBC 8.9 7.4 4.9 5.0 5.9  NEUTROABS  --   --  3.2 2.8 3.4  HGB 9.5* 10.4* 10.1* 10.5* 10.9*  HCT 33.2* 36.1 35.3* 37.2 38.3  MCV 79.8* 80.8 80.4 79.8* 80.1  PLT 302 272 250 237 [id number]   Basic Metabolic Panel: Recent Labs  Lab 07/10/22 1752 07/11/22 0435 07/12/22 0630 07/13/22 0538 07/14/22 0638  NA 135 142 141 141 139  K 4.4 3.5 3.3* 3.8 3.8  CL 98 103 102 103 101  CO2 27 28 32 31 32  GLUCOSE 99 82 89 98 99  BUN 9 7 7 10 8   CREATININE 0.90 0.67 0.68 0.65 0.68  CALCIUM 8.8* 8.5* 8.7* 9.0 9.0  MG  --  2.1 2.0 2.0 2.0  PHOS  --   --  4.8* 4.7* 4.3   Liver Function Tests: Recent Labs  Lab 07/12/22 0630 07/13/22 0538 07/14/22 0638  AST 20 16 14*  ALT 27 22 16   ALKPHOS 82 76 72  BILITOT 0.8 0.7 0.7  PROT 6.1* 6.5 6.5  ALBUMIN 2.8* 3.0* 3.1*   CBG: Recent Labs  Lab 07/13/22 1227 07/13/22 1642 07/13/22 2057 07/14/22 0758 07/14/22 1031  GLUCAP 149* 132* 135* 108* 177*   Discharge time spent: greater than 30 minutes.  Signed: Raiford Noble, DO [facility] 07/15/2022

## 2022-07-14 NOTE — Progress Notes (Signed)
SATURATION QUALIFICATIONS: (This note is used to comply with regulatory documentation for home oxygen)  Patient Saturations on Room Air at Rest = 95%  Patient Saturations on Room Air while Ambulating = 84%  Patient Saturations on 2 Liters of oxygen while Ambulating = 96%  Please briefly explain why patient needs home oxygen: to maintain O2 sats above 90% while ambulating   Deirdre Pippins, RN

## 2022-07-14 NOTE — Progress Notes (Addendum)
Rounding Note    Patient Name: Tanya Lowe Date of Encounter: 07/14/2022  [facility] Cardiologist: Dina Rich, MD    Subjective   Feeling better.  Inpatient Medications    Scheduled Meds:  aspirin EC  81 mg Oral Daily   atorvastatin  80 mg Oral q1800   enoxaparin (LOVENOX) injection  40 mg Subcutaneous Q24H   ferrous sulfate  325 mg Oral QODAY   furosemide  40 mg Intravenous Q12H   gabapentin  1,200 mg Oral TID   guaiFENesin  1,200 mg Oral BID   insulin aspart  0-15 Units Subcutaneous TID WC   lisinopril  2.5 mg Oral Daily   metoprolol tartrate  25 mg Oral BID   pantoprazole  40 mg Oral Daily   polyethylene glycol  17 g Oral BID   senna-docusate  1 tablet Oral BID   tiZANidine  4 mg Oral TID    PRN Meds: albuterol, ipratropium-albuterol, nitroGLYCERIN, mouth rinse, oxyCODONE **AND** oxyCODONE-acetaminophen   Vital Signs    Vitals:   07/13/22 0438 07/13/22 2055 07/14/22 0433 07/14/22 0500  BP:  113/77 114/78   Pulse:  74 65   Resp:  18 14   Temp:  (!) 97 F (36.1 C) (!) 97.2 F (36.2 C)   TempSrc:      SpO2:  98% 95%   Weight: 73.3 kg   75.8 kg  Height:        Intake/Output Summary (Last 24 hours) at 07/14/2022 1202 Last data filed at 07/14/2022 0641 Gross per 24 hour  Intake 240 ml  Output --  Net 240 ml      07/14/2022    5:00 AM 07/13/2022    4:38 AM 07/12/2022    5:00 AM  Last 3 Weights  Weight (lbs) 167 lb 1.7 oz 161 lb 9.6 oz 167 lb 12.3 oz  Weight (kg) 75.8 kg 73.3 kg 76.1 kg      Telemetry    NSR - Personally Reviewed  ECG    No new ECG reviewed.  Physical Exam    GEN: No acute distress.   Neck: No JVD Cardiac: RRR, no murmurs, rubs, or gallops.  Respiratory:decreased breath sounds with some crackles GI: Soft, nontender, non-distended  MS: No edema; No deformity. Neuro:  Nonfocal  Psych: Normal affect   Labs    High Sensitivity Troponin:   Recent Labs  Lab 07/10/22 1752 07/10/22 1902  TROPONINIHS 6 6      Chemistry Recent Labs  Lab 07/12/22 0630 07/13/22 0538 07/14/22 0638  NA 141 141 139  K 3.3* 3.8 3.8  CL 102 103 101  CO2 32 31 32  GLUCOSE 89 98 99  BUN 7 10 8   CREATININE 0.68 0.65 0.68  CALCIUM 8.7* 9.0 9.0  MG 2.0 2.0 2.0  PROT 6.1* 6.5 6.5  ALBUMIN 2.8* 3.0* 3.1*  AST 20 16 14*  ALT 27 22 16   ALKPHOS 82 76 72  BILITOT 0.8 0.7 0.7  GFRNONAA >60 >60 >60  ANIONGAP 7 7 6      Hematology Recent Labs  Lab 07/12/22 0630 07/13/22 0538 07/14/22 0638  WBC 4.9 5.0 5.9  RBC 4.39 4.66 4.78  HGB 10.1* 10.5* 10.9*  HCT 35.3* 37.2 38.3  MCV 80.4 79.8* 80.1  MCH 23.0* 22.5* 22.8*  MCHC 28.6* 28.2* 28.5*  RDW 20.6* 20.8* 20.6*  PLT 250 237 227    BNP Recent Labs  Lab 07/12/22 0630 07/13/22 0539 07/14/22 0638  BNP 543.0* 345.0*  201.0*     Radiology    DG CHEST PORT 1 VIEW  Result Date: 07/14/2022 CLINICAL DATA:  [id number] EXAM: PORTABLE CHEST 1 VIEW COMPARISON:  July 13, 2022 FINDINGS: The cardiomediastinal silhouette is unchanged in contour.Atherosclerotic calcifications. No pleural effusion. No pneumothorax. Improved LEFT lung aeration with mild persistent linear opacities likely reflecting atelectasis. Visualized abdomen is unremarkable. Spinal stimulator. IMPRESSION: Improved LEFT basilar lung aeration with some persistent opacities likely reflecting atelectasis. Electronically Signed   By: Valentino Saxon M.D.   On: 07/14/2022 10:01   DG CHEST PORT 1 VIEW  Result Date: 07/13/2022 CLINICAL DATA:  CHF. EXAM: PORTABLE CHEST 1 VIEW COMPARISON:  July 12, 2022 FINDINGS: The right pleural effusion and associated opacity are no longer visualized. Mild opacity in left base is mildly improved. The cardiomediastinal silhouette is stable. No pneumothorax. No nodules or masses. No overt edema. IMPRESSION: 1. The right pleural effusion and underlying opacity are no longer visualized. 2. Improving but persistent opacity in the left base. 3. No other change.  Electronically Signed   By: Dorise Bullion III M.D.   On: 07/13/2022 08:11    Cardiac Studies   Echo 9.22.23   Left ventricular ejection fraction, by estimation, is 65 to 70%. The left ventricle has normal function. The left ventricle has no regional wall motion abnormalities. 1. RV apex appears to be thickening more normally (? McConnell's sign) Compared to echo from 2022, RV function is different . Right ventricular systolic function mild to moderately reduced. The right ventricular size is mildly enlarged. There is normal pulmonary artery systolic pressure. 2. 3. Right atrial size was mildly dilated. 4. The mitral valve is normal in structure. Trivial mitral valve regurgitation. The aortic valve is tricuspid. Aortic valve regurgitation is not visualized. Aortic valve sclerosis is present, with no evidence of aortic valve stenosis. 5. The inferior vena cava is dilated in size with >50% respiratory variability, suggesting right atrial pressure of 8 mmHg.   Patient Profile     51 y.o. female  with hx of CAD (s/p PTCA/stent x 3), COPD, GERD , HTN, tob abuse admitted with acute CHF in setting of recent URI.  Assessment & Plan    Acute diastolic CHF echo with normal LVEF but moderately reduced RV function with normal pulm systolic pressures. CT negative for PE-has diuresed >3L. Being discharged today. Discussed with Dr. Domenic Polite. Would send home on low dose diuretic lasix 20 mg once daily and f/u as OP. RV could be down with OSA  CAD NSTEMI 2016 DES mRCA, DES dRCA and DES mLAD no angina  HTN BP controlled  Tobacco abuse-smoking cessation discussed   [place] will sign off.   Medication Recommendations:  same home meds with addition of lasix 20 mg daily Other recommendations (labs, testing, etc):    Follow up as an outpatient:  will arrange 2-3 week OP f/u  For questions or updates, please contact [place] Please consult [email or web address] for contact  info under        Signed, Ermalinda Barrios, PA-C  07/14/2022, 12:02 PM      Attending note:  Chart reviewed and case discussed with Ms. Vita Barley, I agree with her above findings.  Patient evaluated by Dr. Harrington Challenger recently, improved clinically with IV diuresis.  Does have moderate RV dysfunction which could be secondary to OSA, no evidence of pulmonary embolus by chest CTA.  Would continue with present outpatient regimen and add low-dose Lasix, was not on diuretic consistently.  Outpatient  follow-up scheduled.  No further inpatient testing anticipated.  Jonelle Sidle, M.D., F.A.C.C.

## 2022-07-14 NOTE — TOC Transition Note (Signed)
Transition of Care [facility]) - CM/SW Discharge Note   Patient Details  Name: Tanya Lowe MRN: [id number] Date of Birth: 1970-12-22  Transition of [facility] CM/SW Contact:  Shade Flood, LCSW Phone Number: 07/14/2022, 12:13 PM   Clinical Narrative:     Pt stable for dc home today per MD. Pt needing home O2 for dc. Spoke with pt to review dc planning. Per pt, she currently has a CPAP through [facility] in [place] and she would like O2 from them. Offered HH to patient who declines.   Referral made to [facility] and they will work with pt/family to arrange delivery. Pt can dc once portable tank delivered either to daughter to bring with her to pick up patient, or delivered to patient directly.  No other TOC needs for dc.  Expected Discharge Plan: Home/Self Care Barriers to Discharge: Barriers Resolved   Patient Goals and CMS Choice Patient states their goals for this hospitalization and ongoing recovery are:: go home CMS Medicare.gov Compare Post Acute Care list provided to:: Patient Choice offered to / list presented to : Patient  Expected Discharge Plan and Services Expected Discharge Plan: Home/Self Care In-house Referral: Clinical Social Work   Post Acute Care Choice: Durable Medical Equipment Living arrangements for the past 2 months: [place]                 DME Arranged: Oxygen DME Agency: [facility] [place], [place]) Date DME Agency Contacted: 07/14/22                Prior Living Arrangements/Services Living arrangements for the past 2 months: Single Family Home Lives with:: Self Patient language and need for interpreter reviewed:: Yes Do you feel safe going back to the place where you live?: Yes      Need for Family Participation in Patient Care: No (Comment)   Current home services: DME Criminal Activity/Legal Involvement Pertinent to Current Situation/Hospitalization: No - Comment as needed  Activities of Daily Living Home Assistive  Devices/Equipment: Gilford Rile (specify type) ADL Screening (condition at time of admission) Patient's cognitive ability adequate to safely complete daily activities?: Yes Is the patient deaf or have difficulty hearing?: No Does the patient have difficulty seeing, even when wearing glasses/contacts?: No Does the patient have difficulty concentrating, remembering, or making decisions?: No Patient able to express need for assistance with ADLs?: Yes Does the patient have difficulty dressing or bathing?: No Independently performs ADLs?: Yes (appropriate for developmental age) Does the patient have difficulty walking or climbing stairs?: No Weakness of Legs: None Weakness of Arms/Hands: None  Permission Sought/Granted Permission sought to share information with : Facility Art therapist granted to share information with : Yes, Verbal Permission Granted     Permission granted to share info w AGENCY: [place]        Emotional Assessment   Attitude/Demeanor/Rapport: Engaged Affect (typically observed): Pleasant Orientation: : Oriented to Self, Oriented to Place, Oriented to  Time, Oriented to Situation Alcohol / Substance Use: Not Applicable Psych Involvement: No (comment)  Admission diagnosis:  Congestive heart failure (CHF) (HCC) [I50.9] Acute congestive heart failure, unspecified heart failure type [facility]) [I50.9] Patient Active Problem List   Diagnosis Date Noted   Elevated brain natriuretic peptide (BNP) level 07/11/2022   Microcytic anemia 07/11/2022   GERD (gastroesophageal reflux disease) 07/11/2022   Type 2 diabetes mellitus ([place]) 07/11/2022   CAD (coronary artery disease) 07/11/2022   COPD (chronic obstructive pulmonary disease) ([place]) 07/11/2022   Congestive heart failure (CHF) (  HCC) 07/10/2022   Essential hypertension 08/05/2017   Ureteral stone 03/14/2015   NSTEMI (non-ST elevated myocardial infarction) (HCC) 10/25/2014   Chest pain 10/24/2014    Obesity 10/24/2014   Tobacco abuse 10/24/2014   PCP:  Joana Reamer, DO Pharmacy:   [name], [place] - [address] [place] [address] Phone: [phone] Fax: [phone]  [facility] - [place], [place] - [address] [place] [address] Phone: [phone] Fax: [phone]  [facility] - [place], [place] - [address] [place] [address] Phone: [phone] Fax: [phone]     Social Determinants of Health (SDOH) Interventions    Readmission Risk Interventions    07/14/2022   12:12 PM  Readmission Risk Prevention Plan  Transportation Screening Complete  Home Care Screening Complete  Medication Review (RN CM) Complete     Final next level of care: Home/Self Care Barriers to Discharge: Barriers Resolved   Patient Goals and CMS Choice Patient states their goals for this hospitalization and ongoing recovery are:: go home CMS Medicare.gov Compare Post Acute Care list provided to:: Patient Choice offered to / list presented to : Patient  Discharge Placement                       Discharge Plan and Services In-house Referral: Clinical Social Work   Post Acute Care Choice: Durable Medical Equipment          DME Arranged: Oxygen DME Agency: [facility] [place], [place]) Date DME Agency Contacted: 07/14/22                Social Determinants of Health (SDOH) Interventions     Readmission Risk Interventions    07/14/2022   12:12 PM  Readmission Risk Prevention Plan  Transportation Screening Complete  Home Care Screening Complete  Medication Review (RN CM) Complete

## 2022-07-15 DIAGNOSIS — E8809 Other disorders of plasma-protein metabolism, not elsewhere classified: Secondary | ICD-10-CM

## 2022-07-15 DIAGNOSIS — E876 Hypokalemia: Secondary | ICD-10-CM

## 2022-07-15 DIAGNOSIS — R590 Localized enlarged lymph nodes: Secondary | ICD-10-CM

## 2022-07-15 DIAGNOSIS — R591 Generalized enlarged lymph nodes: Secondary | ICD-10-CM

## 2022-07-15 DIAGNOSIS — G8929 Other chronic pain: Secondary | ICD-10-CM

## 2022-07-18 ENCOUNTER — Other Ambulatory Visit (HOSPITAL_COMMUNITY): Payer: Self-pay | Admitting: Family Medicine

## 2022-07-18 DIAGNOSIS — R911 Solitary pulmonary nodule: Secondary | ICD-10-CM

## 2022-07-21 ENCOUNTER — Encounter (HOSPITAL_COMMUNITY): Payer: Self-pay

## 2022-07-21 ENCOUNTER — Ambulatory Visit (HOSPITAL_COMMUNITY): Payer: Medicare Other

## 2022-07-31 ENCOUNTER — Encounter (HOSPITAL_COMMUNITY)
Admission: RE | Admit: 2022-07-31 | Discharge: 2022-07-31 | Disposition: A | Discharge status: Home / Self Care | Payer: Medicare Other | Source: Ambulatory Visit | Attending: Family Medicine | Admitting: Family Medicine

## 2022-07-31 DIAGNOSIS — R911 Solitary pulmonary nodule: Secondary | ICD-10-CM | POA: Insufficient documentation

## 2022-07-31 MED ORDER — FLUDEOXYGLUCOSE F - 18 (FDG) INJECTION
8.5700 | Freq: Once | INTRAVENOUS | Status: AC | PRN
  Administered 2022-07-31: 8.57 via INTRAVENOUS

## 2022-08-06 NOTE — Progress Notes (Unsigned)
Cardiology Office Note    Date:  08/07/2022   ID:  Tanya Lowe, DOB 02/13/1971, MRN [id number]  PCP:  Joana Reamer, DO  Cardiologist: Dina Rich, MD    Chief Complaint  Patient presents with   Hospitalization Follow-up    History of Present Illness:    Tanya Lowe is a 51 y.o. female with past medical history of CAD (s/p NSTEMI in 2016 with DES to mid-RCA, DES to [facility] and DES to mid-LAD, low-risk NST in 05/2016 and 11/2018), HTN, HLD, prediabetes, fibromyalgia, OSA (on CPAP) and continued tobacco use who presents to the office today for hospital follow-up.  She most recently presented to [facility] ED in 06/2022 for evaluation of chest discomfort and had recently been admitted in [place], [place] for pneumonia. Troponin values were negative at 6 and EKG was without acute ST changes. CTA was negative for a PE and showed no evidence of thoracic aortic dissection. She did have enlarged lymph nodes in the mediastinum and hilar regions with follow-up PET/CT recommended. Her BNP was elevated to 345 and she was treated for an acute diastolic CHF exacerbation and responded well with IV Lasix. Echocardiogram showed a preserved EF of 65 to 70% with no wall motion abnormalities. Her RV apex appeared to be thickened and RV function was mild to moderately reduced which was felt to possibly be secondary to OSA. It was recommend to start Lasix 20 mg daily at discharge.  Her PCP did order a follow-up PET scan which showed enlarged lymph nodes with low-level hypermetabolism and most likely felt to be inflammatory/reactive and no significant change compared to prior CT Imaging. Pulmonary consult was recommended given ground-glass attenuation and hypersensitivity pneumonitis.  In talking with the patient and her grandson today, she reports still having chest discomfort since recent hospital admission and this can occur at rest or with activity. Reports this does not resemble her prior anginal  symptoms as her main symptom in 2016 was jaw/teeth pain. She does have baseline dyspnea on exertion and is on 2 L nasal cannula at baseline. She is scheduled to establish with Pulmonology next week. She does use her CPAP on a nightly basis. No reported orthopnea, PND or pitting edema. Her weight has been stable at approximately 160 lbs on her home scales.   Past Medical History:  Diagnosis Date   Arthritis    Bilateral hand pain    CAD (coronary artery disease)    a. s/p NSTEMI in 2016 with DES to mid-RCA, DES to [facility] and DES to mid-LAD b. low-risk NST in 05/2016 and 11/2018   Cervical radiculopathy    Chronic neck and back pain    Emphysema lung (HCC)    Fibromyalgia    Hypertension    Kidney stones    hx of w previous lithotripsy   MI (myocardial infarction) (HCC)    Pain management    [facility]   Thoracic back pain     Past Surgical History:  Procedure Laterality Date   CARDIAC SURGERY     CYSTOSCOPY WITH RETROGRADE PYELOGRAM, URETEROSCOPY AND STENT PLACEMENT Left 03/14/2015   Procedure: CYSTOSCOPY WITH LEFT RETROGRADE PYELOGRAM;  Surgeon: Alfredo Martinez, MD;  Location: [facility];  Service: Urology;  Laterality: Left;   CYSTOSCOPY WITH RETROGRADE PYELOGRAM, URETEROSCOPY AND STENT PLACEMENT Left 03/20/2015   Procedure: CYSTOSCOPY WITH LEFT  RETROGRADE LEFT URETEROSCOPY AND LEFT  STENT ;  Surgeon: Bjorn Pippin, MD;  Location: [facility];  Service: Urology;  Laterality: Left;  HOLMIUM LASER APPLICATION Left 03/20/2015   Procedure: HOLMIUM LASER APPLICATION;  Surgeon: Bjorn PippinJohn Wrenn, MD;  Location: [facility];  Service: Urology;  Laterality: Left;   LEFT HEART CATHETERIZATION WITH CORONARY ANGIOGRAM N/A 10/25/2014   Procedure: LEFT HEART CATHETERIZATION WITH CORONARY ANGIOGRAM;  Surgeon: Kathleene Hazelhristopher D McAlhany, MD;  [facility];  Service: Cardiovascular;  Laterality: N/A;   LITHOTRIPSY  about 2011   SPINAL FUSION     L5-S1   TUBAL LIGATION      Current Medications: Outpatient Medications  Prior to Visit  Medication Sig Dispense Refill   acetaminophen (TYLENOL) 500 MG tablet Take 1,000 mg by mouth every 6 (six) hours as needed for mild pain.      albuterol (PROVENTIL HFA;VENTOLIN HFA) 108 (90 Base) MCG/ACT inhaler Inhale 1-2 puffs into the lungs every 6 (six) hours as needed for wheezing or shortness of breath.     amoxicillin-clavulanate (AUGMENTIN) 875-125 MG tablet 1 tablet Orally every 12 hrs for 5 days     aspirin EC 81 MG tablet Take 81 mg by mouth daily.     atorvastatin (LIPITOR) 80 MG tablet Take 1 tablet (80 mg total) by mouth daily at 6 PM. 30 tablet 11   busPIRone (BUSPAR) 7.5 MG tablet Take 15 mg by mouth 3 (three) times daily.     DULoxetine (CYMBALTA) 30 MG capsule Take 30 mg by mouth daily. Take with 60 mg to equal 90 mg     DULoxetine (CYMBALTA) 60 MG capsule Take 60 mg by mouth daily. Take with 30 mg to equal 90 mg     FEROSUL 325 (65 Fe) MG tablet Take 325 mg by mouth every other day.     fexofenadine (ALLEGRA) 180 MG tablet Take 180 mg by mouth daily.     fluticasone (FLONASE) 50 MCG/ACT nasal spray Place 2 sprays into both nostrils daily as needed for allergies.      furosemide (LASIX) 20 MG tablet Take 1 tablet (20 mg total) by mouth daily. 30 tablet 0   gabapentin (NEURONTIN) 600 MG tablet Take 1,200 mg by mouth 3 (three) times daily.     ipratropium-albuterol (DUONEB) 0.5-2.5 (3) MG/3ML SOLN Take 3 mLs by nebulization every 6 (six) hours as needed (shortness of breath).     lisinopril (PRINIVIL,ZESTRIL) 2.5 MG tablet Take 1 tablet (2.5 mg total) by mouth daily. 90 tablet 3   metFORMIN (GLUCOPHAGE) 500 MG tablet Take 500 mg by mouth daily with breakfast.     metoprolol tartrate (LOPRESSOR) 25 MG tablet Take 1 tablet (25 mg total) by mouth 2 (two) times daily. 180 tablet 3   nitroGLYCERIN (NITROSTAT) 0.4 MG SL tablet Place 1 tablet (0.4 mg total) under the tongue every 5 (five) minutes as needed for chest pain. 25 tablet 3   omeprazole (PRILOSEC) 20 MG capsule  Take 20 mg by mouth daily.     oxyCODONE-acetaminophen (PERCOCET) 10-325 MG per tablet Take 1 tablet by mouth every 8 (eight) hours as needed for pain.     polyethylene glycol (MIRALAX / GLYCOLAX) 17 g packet Take 17 g by mouth 2 (two) times daily. 14 each 0   predniSONE (DELTASONE) 20 MG tablet 2 tablets Orally Once a day for 5 days     senna-docusate (SENOKOT-S) 8.6-50 MG tablet Take 1 tablet by mouth at bedtime. 30 tablet 0   tiZANidine (ZANAFLEX) 4 MG tablet Take 4 mg by mouth 3 (three) times daily.     No facility-administered medications prior to visit.  Allergies:   Lyrica [pregabalin], Bactrim [sulfamethoxazole-trimethoprim], and Prednisone   Social History   Socioeconomic History   Marital status: Divorced    Spouse name: Not on file   Number of children: Not on file   Years of education: Not on file   Highest education level: Not on file  Occupational History   Not on file  Tobacco Use   Smoking status: Every Day    Packs/day: 0.50    Years: 24.00    Total pack years: 12.00    Types: Cigarettes    Start date: 10/25/1990   Smokeless tobacco: Never  Vaping Use   Vaping Use: Never used  Substance and Sexual Activity   Alcohol use: No    Alcohol/week: 0.0 standard drinks of alcohol   Drug use: No   Sexual activity: Not on file  Other Topics Concern   Not on file  Social History Narrative   Not on file   Social Determinants of Health   Financial Resource Strain: Not on file  Food Insecurity: No Food Insecurity (07/12/2022)   Hunger Vital Sign    Worried About Running Out of Food in the Last Year: Never true    Ran Out of Food in the Last Year: Never true  Transportation Needs: No Transportation Needs (07/12/2022)   PRAPARE - Hydrologist (Medical): No    Lack of Transportation (Non-Medical): No  Physical Activity: Not on file  Stress: Not on file  Social Connections: Not on file     Family History:  The patient's family history  includes Cancer in an other family member; Diabetes in her mother and another family member; Seizures in an other family member; Stroke in her mother.   Review of Systems:    Please see the history of present illness.     All other systems reviewed and are otherwise negative except as noted above.   Physical Exam:    VS:  BP 114/68   Pulse 94   Ht 5\' 2"  (1.575 m)   Wt 168 lb 6.4 oz (76.4 kg)   SpO2 98% Comment: 2 LPM  BMI 30.80 kg/m    General: Well developed, well nourished,female appearing in no acute distress. Head: Normocephalic, atraumatic. Neck: No carotid bruits. JVD not elevated.  Lungs: Respirations regular and unlabored, no current wheezing or rales. On 2L [place].  Heart: Regular rate and rhythm. No S3 or S4.  No murmur, no rubs, or gallops appreciated. Abdomen: Appears non-distended. No obvious abdominal masses. Msk:  Strength and tone appear normal for age. No obvious joint deformities or effusions. Extremities: No clubbing or cyanosis. No pitting edema.  Distal pedal pulses are 2+ bilaterally. Neuro: Alert and oriented X 3. Moves all extremities spontaneously. No focal deficits noted. Psych:  Responds to questions appropriately with a normal affect. Skin: No rashes or lesions noted  Wt Readings from Last 3 Encounters:  08/07/22 168 lb 6.4 oz (76.4 kg)  07/14/22 167 lb 1.7 oz (75.8 kg)  02/19/22 177 lb (80.3 kg)     Studies/Labs Reviewed:   EKG:  EKG is not ordered today.    Recent Labs: 07/14/2022: ALT 16; B Natriuretic Peptide 201.0; BUN 8; Creatinine, Ser 0.68; Hemoglobin 10.9; Magnesium 2.0; Platelets 227; Potassium 3.8; Sodium 139   Lipid Panel    Component Value Date/Time   CHOL 121 10/25/2014 0004   TRIG 251 (H) 10/25/2014 0004   HDL 21 (L) 10/25/2014 0004   CHOLHDL 5.8 10/25/2014 0004  VLDL 50 (H) 10/25/2014 0004   LDLCALC 50 10/25/2014 0004    Additional studies/ records that were reviewed today include:   NST: 11/2018 There was no ST segment  deviation noted during stress. The study is normal. There are no perfusion defects consistent with prior infarct or current ischemia This is a low risk study. The left ventricular ejection fraction is normal (55-65%).  Echocardiogram: 06/2022 IMPRESSIONS     1. Left ventricular ejection fraction, by estimation, is 65 to 70%. The  left ventricle has normal function. The left ventricle has no regional  wall motion abnormalities.   2. RV apex appears to be thickening more normally (? McConnell's sign)      Compared to echo from 2022, RV function is different . Right  ventricular systolic function mild to moderately reduced. The right  ventricular size is mildly enlarged. There is normal pulmonary artery  systolic pressure.   3. Right atrial size was mildly dilated.   4. The mitral valve is normal in structure. Trivial mitral valve  regurgitation.   5. The aortic valve is tricuspid. Aortic valve regurgitation is not  visualized. Aortic valve sclerosis is present, with no evidence of aortic  valve stenosis.   6. The inferior vena cava is dilated in size with >50% respiratory  variability, suggesting right atrial pressure of 8 mmHg.   Assessment:    1. Coronary artery disease involving native coronary artery of native heart with other form of angina pectoris (HCC)   2. Chronic heart failure with preserved ejection fraction (HCC)   3. Essential hypertension   4. Hyperlipidemia LDL goal <70   5. OSA (obstructive sleep apnea)      Plan:   In order of problems listed above:  1. CAD - She is s/p NSTEMI in 2016 with DES to mid-RCA, DES to [facility] and DES to mid-LAD with low-risk NST's in 05/2016 and 11/2018. Her chest pain was felt to be atypical for a cardiac etiology during her admission and she did rule out for ACS with echocardiogram being reassuring as well. - Her recent pain does seem atypical for a cardiac etiology as it can occur at rest or with activity and is sometimes  worse with deep breathing. Reviewed options with the patient and she wishes to hold off on a stress test for now. She is open to trying medical therapy and will start Imdur 30 mg daily. We reviewed she could take 15 mg daily for several days to reduce the risk of a headache. If no improvement or symptoms persist, can consider a repeat Lexiscan Myoview.  2. HFpEF - She denies any specific orthopnea or PND and appears euvolemic on examination today. Her weight has been stable at 160 lbs on her home scales. Continue Lasix 20 mg daily. She did have recent labs with her PCP earlier this week and we will request a copy of these.  3. HTN - Her blood pressure is well-controlled at 114/68 during today's visit. Continue current medical therapy with Lasix 20 mg daily, Lisinopril 2.5 mg daily and Lopressor 25 mg twice daily.  4. HLD - Her LDL was at 40 in 12/2021. Remains on Atorvastatin 80 mg daily.  5. OSA/Abnormal CT - Prior CT showed enlarged lymph nodes and recent PET showed low-level hypermetabolism and node enlargement was felt to be secondary to an inflammatory or reactive process. She is scheduled to establish with Pulmonology next week.   She does have previously scheduled Cardiology follow-up next month and will  keep this for now.    Medication Adjustments/Labs and Tests Ordered: Current medicines are reviewed at length with the patient today.  Concerns regarding medicines are outlined above.  Medication changes, Labs and Tests ordered today are listed in the Patient Instructions below. Patient Instructions  Medication Instructions:   Start Imdur 30 mg Daily ( You may start with 1/2 tablet Daily for at couple of days)   Call the office if no improvement in 2-3 Weeks.   *If you need a refill on your cardiac medications before your next appointment, please call your pharmacy*   Lab Work: NONE   If you have labs (blood work) drawn today and your tests are completely normal, you will  receive your results only by: MyChart Message (if you have MyChart) OR A paper copy in the mail If you have any lab test that is abnormal or we need to change your treatment, we will call you to review the results.   Testing/Procedures: NONE   Follow-Up: At [facility], you and your health needs are our priority.  As part of our continuing mission to provide you with exceptional heart care, we have created designated Provider Care Teams.  These Care Teams include your primary Cardiologist (physician) and Advanced Practice Providers (APPs -  Physician Assistants and Nurse Practitioners) who all work together to provide you with the care you need, when you need it.  We recommend signing up for the patient portal called "MyChart".  Sign up information is provided on this After Visit Summary.  MyChart is used to connect with patients for Virtual Visits (Telemedicine).  Patients are able to view lab/test results, encounter notes, upcoming appointments, etc.  Non-urgent messages can be sent to your provider as well.   To learn more about what you can do with MyChart, go to [email or web address].    Your next appointment:    As Scheduled   The format for your next appointment:   In Person  Provider:   Dina Rich, MD    Other Instructions Thank you for choosing [facility]!    Important Information About Sugar         Signed, Ellsworth Lennox, PA-C  08/07/2022 4:37 PM    [facility] [address] [place], [place] [address] Phone: [phone] Fax: [phone]

## 2022-08-07 ENCOUNTER — Encounter: Payer: Self-pay | Admitting: Student

## 2022-08-07 ENCOUNTER — Encounter: Payer: Self-pay | Admitting: *Deleted

## 2022-08-07 ENCOUNTER — Ambulatory Visit: Payer: Medicare Other | Attending: Student | Admitting: Student

## 2022-08-07 VITALS — BP 114/68 | HR 94 | Ht 62.0 in | Wt 168.4 lb

## 2022-08-07 DIAGNOSIS — I10 Essential (primary) hypertension: Secondary | ICD-10-CM

## 2022-08-07 DIAGNOSIS — E785 Hyperlipidemia, unspecified: Secondary | ICD-10-CM

## 2022-08-07 DIAGNOSIS — I25118 Atherosclerotic heart disease of native coronary artery with other forms of angina pectoris: Secondary | ICD-10-CM | POA: Diagnosis not present

## 2022-08-07 DIAGNOSIS — G4733 Obstructive sleep apnea (adult) (pediatric): Secondary | ICD-10-CM

## 2022-08-07 DIAGNOSIS — I5032 Chronic diastolic (congestive) heart failure: Secondary | ICD-10-CM

## 2022-08-07 MED ORDER — ISOSORBIDE MONONITRATE ER 30 MG PO TB24: 30.0000 mg | ORAL_TABLET | Freq: Every day | ORAL | 3 refills | Status: DC

## 2022-08-07 NOTE — Patient Instructions (Signed)
Medication Instructions:   Start Imdur 30 mg Daily ( You may start with 1/2 tablet Daily for at couple of days)   Call the office if no improvement in 2-3 Weeks.   *If you need a refill on your cardiac medications before your next appointment, please call your pharmacy*   Lab Work: NONE   If you have labs (blood work) drawn today and your tests are completely normal, you will receive your results only by: [place] (if you have MyChart) OR A paper copy in the mail If you have any lab test that is abnormal or we need to change your treatment, we will call you to review the results.   Testing/Procedures: NONE   Follow-Up: At [facility], you and your health needs are our priority.  As part of our continuing mission to provide you with exceptional heart care, we have created designated Provider Care Teams.  These Care Teams include your primary Cardiologist (physician) and Advanced Practice Providers (APPs -  Physician Assistants and Nurse Practitioners) who all work together to provide you with the care you need, when you need it.  We recommend signing up for the patient portal called "MyChart".  Sign up information is provided on this After Visit Summary.  MyChart is used to connect with patients for Virtual Visits (Telemedicine).  Patients are able to view lab/test results, encounter notes, upcoming appointments, etc.  Non-urgent messages can be sent to your provider as well.   To learn more about what you can do with MyChart, go to [email or web address].    Your next appointment:    As Scheduled   The format for your next appointment:   In Person  Provider:   Carlyle Dolly, MD    Other Instructions Thank you for choosing [facility]!    Important Information About Sugar

## 2022-08-08 ENCOUNTER — Encounter: Payer: Self-pay | Admitting: Family Medicine

## 2022-08-12 ENCOUNTER — Encounter: Payer: Self-pay | Admitting: Internal Medicine

## 2022-08-12 ENCOUNTER — Ambulatory Visit (INDEPENDENT_AMBULATORY_CARE_PROVIDER_SITE_OTHER): Payer: Medicare Other | Admitting: Internal Medicine

## 2022-08-12 VITALS — BP 126/82 | HR 74 | Temp 98.4°F | Ht 62.0 in | Wt 169.6 lb

## 2022-08-12 DIAGNOSIS — J440 Chronic obstructive pulmonary disease with acute lower respiratory infection: Secondary | ICD-10-CM | POA: Diagnosis not present

## 2022-08-12 DIAGNOSIS — J449 Chronic obstructive pulmonary disease, unspecified: Secondary | ICD-10-CM | POA: Diagnosis not present

## 2022-08-12 DIAGNOSIS — J9611 Chronic respiratory failure with hypoxia: Secondary | ICD-10-CM | POA: Diagnosis not present

## 2022-08-12 DIAGNOSIS — F17210 Nicotine dependence, cigarettes, uncomplicated: Secondary | ICD-10-CM

## 2022-08-12 DIAGNOSIS — J9612 Chronic respiratory failure with hypercapnia: Secondary | ICD-10-CM

## 2022-08-12 MED ORDER — BREZTRI AEROSPHERE 160-9-4.8 MCG/ACT IN AERO: 2.0000 | INHALATION_SPRAY | Freq: Two times a day (BID) | RESPIRATORY_TRACT | 0 refills | Status: DC

## 2022-08-12 MED ORDER — BREZTRI AEROSPHERE 160-9-4.8 MCG/ACT IN AERO: 2.0000 | INHALATION_SPRAY | Freq: Two times a day (BID) | RESPIRATORY_TRACT | 11 refills | Status: DC

## 2022-08-12 NOTE — Progress Notes (Unsigned)
Tanya Lowe, female    DOB: 04-06-1971    MRN: [id number]   Brief patient profile:  51   yowf  allergic rhinitis worse in spring//asthma periodically more need albuterol x 2013  referred to pulmonary clinic in [place]  08/12/2022 by Orpah Cobb DO  for copd eval s/p admit.  Admit date:     07/10/2022  Discharge date: 07/15/22  Discharge Physician: Marguerita Merles, DO     PCP: Joana Reamer, DO    Recommendations at discharge:    Follow-up with PCP within 1 to 2 weeks and repeat CBC, CMP, mag, Phos within 1 week Follow-up with cardiology within 1 to 2 weeks Follow-up and evaluate for outpatient  mediastinal lymphadenopathy with either PET scan and tissue biopsy   Discharge Diagnoses: Principal Problem:   Congestive heart failure (CHF) (HCC)   Obesity   Tobacco abuse   Essential hypertension   Elevated brain natriuretic peptide (BNP) level   Microcytic anemia   GERD (gastroesophageal reflux disease)   Type 2 diabetes mellitus (HCC)   CAD (coronary artery disease)   COPD (chronic obstructive pulmonary disease) (HCC)   Hypoalbuminemia   Lymphadenopathy   Mediastinal lymphadenopathy   Hilar lymphadenopathy   Hypokalemia   Chronic back pain      Hospital Course: HPI per Dr. Frankey Shown on 07/10/22 Tanya Lowe is a 51 y.o. female with medical history significant of CAD status post stent placements x 3, COPD, GERD, hypertension, tobacco abuse who presents to the emergency department due to low level of oxygen noted in her PCPs office.  Patient states that she was recently admitted at [place], [place] about 2 weeks ago due to pneumonia, she states that she has not been back to her baseline since being discharged from the hospital.  She followed up with her PCP today and was slated to be hypoxic with O2 sat in the mid 80s, so an EMS was activated and patient was taken to the ED for further evaluation and management.  Patient endorsed a midsternal pressure which  occurred just prior to arrival of the EMS team.  She was treated with 1 dose of aspirin and 1 nitroglycerin sublingual by EMS team en route to the hospital with an improvement in the chest pain.  Patient states that she was recently diagnosed with COPD during a recent hospitalization.  She denies fever, chills, nausea, vomiting, headache, blurry vision.    ED Course:  In the emergency department, she was hemodynamically stable on arrival to the ED except being hypoxic with O2 sat of 87%, supplemental oxygen via [place] at 2 LPM was provided and O2 sats improved to 97%.  Work-up in the ED showed microcytic anemia, BMP was normal, troponin x2 was flat at 6, BNP was 862.  SARS coronavirus 2 was negative. Chest x-ray showed: Central pulmonary vessels are more prominent. Increased interstitial markings are seen in parahilar regions and lower lung fields, more so on the right side. Findings suggest interstitial edema or interstitial pneumonia. There is no focal pulmonary consolidation. There is no pleural effusion. IV Lasix 40 mg x 1 was given.  Hospitalist was asked to admit patient for further evaluation and management.   **Interim History Patient was given IV Lasix and started diuresing.  Cardiology was notified and echocardiogram was ordered and done.  She remains on supplemental oxygen but notes that her legs are less swollen today feels little bit better.  Echocardiogram showed a normal EF of 65 to 70%  with no regional wall motion abnormalities but the right ventricle apex appeared to be thickened more than normally and right ventricular function was different with the systolic function being mild to moderately reduced and the right ventricular size being mildly enlarged.  Because of this the cardiology team was concerned that she had a PE so she underwent a CTA PE scan which was negative for any PE but did show abnormally enlarged lymph nodes in the mediastinum and both hilar regions with possible increase in  size with findings suggesting possible active inflammatory or neoplastic process and recommendations for follow-up PET scan and tissue sampling and she also had diffuse groundglass dispensaries seen in both lungs suggesting possible pulmonary edema or multifocal pneumonia with small bilateral pleural effusions more so on the right.   We will continue IV Lasix per cardiology recommendations and try and wean her off of supplemental oxygen.  Cardiology felt that she is stable from their perspective and recommended changing diuresis to p.o.  Unfortunately she is not able to be weaned off of supplemental oxygen so we will discharge her home on 2 L supplemental oxygen she will need to follow-up with PCP, pulmonary and cardiology outpatient setting.  She is much more euvolemic and stable for discharge at this time   Assessment and Plan:   Acute respiratory failure with hypoxia in the setting of suspected new onset right ventricular dysfunction and congestive heart failure, stable -Chest x-ray done and was suggestive of interstitial edema and interstitial pneumonia patient was recently treated for pneumonia at [facility] health and doing well [place]; Official read showed "Central pulmonary vessels are more prominent. Increased interstitial markings are seen in parahilar regions and lower lung fields, more so on the right side. Findings suggest interstitial edema or interstitial pneumonia. There is no focal pulmonary consolidation. There is no pleural effusion." -Was placed on supplemental oxygen due to her oxygen saturations being in the 80s -SpO2: 98 % O2 Flow Rate (L/min): 3 L/min -Continuous pulse oximetry and maintain O2 saturations greater than 90% -Continue supplemental oxygen via nasal cannula wean O2 as tolerated to room air -Had a echocardiogram done previously last year which showed an EF of 65 to 70% with a left ventricular function being normal and had no RWMA but did have mild LVH and left ventricular  diastolic parameters were normal at that time -She was initiated on IV Lasix in the ED and will continue IV Lasix 40 mg twice daily today -BNP was elevated at 862.0 on admission and trending down and was 543.0 and is now 345.0 yesterday and now 201 at the time of discharge -We will continue lisinopril -Strict I's and O's and daily weights; she is -3.08 L since admission and weight is down 13 lbs  -Repeat chest x-ray in a.m. and echocardiogram this visit -Placed on a cardiac diet -She will need an ambulatory home O2 screen prior to discharge and she desaturated so she will require 2 L of supplemental oxygen -Cardiology consulted for further evaluation recommendations and echocardiogram was done and below and cardiology recommended evaluating for PE which was negative -CT scan done and showed diffuse groundglass opacities seen in both lungs suggesting possibly pulmonary edema multifocal pneumonia; unlikely pneumonia given that she is afebrile and has no white count has no cough or sputum production but the CT scan done did show that there is thickening of the interlobular septa in the lower lung fields suggesting possible edema was chronic and she did have small bilateral pleural effusions worse  on the right -Cardiology recommends continuing additional diuresis and following labs and they are recommending that she would need a follow-up echo in outpatient setting -Repeat chest x-ray done and showed "Improved LEFT basilar lung aeration with some persistent opacities likely reflecting atelectasis." -Repeat chest x-ray in the outpatient setting   Chest pain rule out ACS -Chest pain is likely resolved and she is complaining chest pain resolved after nitroglycerin -She does have a history of CAD with stent placement x3 -Troponin x2 was flat at 6 -Cardiology has been consulted and appreciate further evaluation recommendations -In the interim we will continue aspirin 81 mg p.o. daily, atorvastatin 80 mg  p.o. daily, Toprol tartrate as well as nitroglycerin 0.4 mg and lisinopril 2.5 mg p.o. daily -Follow-up with cardiology in outpatient setting   GERD/GI prophylaxis -Continue with pantoprazole 40 g p.o. daily   Microcytic Anemia -Patient's hemoglobin/hematocrit is now 10.1/35.3 with MCV of 80.4 yesterday and today is now 10.9/38.3 with an MCV of 80.1 -Anemia panel was checked and showed an iron level of 20, UIBC 379, TIBC 399, saturation ratios of 5%, ferritin level 41, folate of 13.3 -Continue with ferrous sulfate 325 mg p.o. every other day -Continue monitor for signs and symptoms bleeding; no overt bleeding noted -Repeat CBC within 1 week   Lymphadenopathy -She has abnormally enlarged lymph nodes in the mediastinum and both hilar regions with possible increased size concerning for active inflammatory or neoplastic process -Recommending following up with a PET CT scan and tissue sampling this can be done in outpatient setting   Essential Hypertension -Continue diuresis IV Lasix 40 mg twice daily, metoprolol tartrate as well as lisinopril -Last blood pressure reading is on the softer side at 113/71   Hypokalemia -Patient's potassium 3.8 -Patient's magnesium level is 2.0 -Continue monitor and trend and replete as necessary -Repeat CMP in a.m.   Diabetes Mellitus Type 2 -Hemoglobin A1c is now 5.8 -Continue moderate NovoLog sign scale insulin AC and monitor CBGs per protocol and adjust insulin regimen as necessary -CBGs ranging from 108-177   COPD -Continue with DuoNeb 3 MLS every 6 as needed for shortness of breath as well as albuterol 3 mL IH every 6 as needed for wheezing or shortness of breath -Currently not in exacerbation but did desaturate on home ambulatory screen and will likely require home supplemental oxygen for discharge -Currently not wheezing and improved   Hypoalbuminemia -Patient's Albumin Level is now 2.8 -> 3.0 and is now 3.1 -Continue to Monitor and Trend and  repeat CMP in the AM    Chronic back pain -Continue with oxycodone IR 10 mg - 325 mg every 8 hours as needed moderate and severe pain along with tizanidine 4 mg p.o. 3 times daily as well as gabapentin 1200 mg p.o. 3 times daily   Tobacco Abuse -Smoking cessation counseling given -If necessary will place on Nicotine Patch however she declines nicotine patch and nicotine gum   Obesity -Complicates overall prognosis and care -Estimated body mass index is 30.56 kg/m as calculated from the following:   Height as of this encounter: 5\' 2"  (1.575 m).   Weight as of this encounter: 75.8 kg.  -Weight Loss and Dietary Counseling given       History of Present Illness  08/12/2022  Pulmonary/ 1st office eval/ Asami Lambright / [facility]  Chief Complaint  Patient presents with   Consult    Consult for copd abnormal CT scan. Sent home from hospital on 2LO2 cont    Dyspnea:  with 02 2lpm doing better and able to do food / uses HC parking  Cough: periodically yellowish brown  Sleep: on cpap s 02 SABA use:  02 :  2lpm daytime but not adjusting   Past Medical History:  Diagnosis Date   Arthritis    Bilateral hand pain    CAD (coronary artery disease)    a. s/p NSTEMI in 2016 with DES to mid-RCA, DES to [facility] and DES to mid-LAD b. low-risk NST in 05/2016 and 11/2018   Cervical radiculopathy    Chronic neck and back pain    Emphysema lung (HCC)    Fibromyalgia    Hypertension    Kidney stones    hx of w previous lithotripsy   MI (myocardial infarction) ([place])    Pain management    [facility]   Thoracic back pain     Outpatient Medications Prior to Visit  Medication Sig Dispense Refill   acetaminophen (TYLENOL) 500 MG tablet Take 1,000 mg by mouth every 6 (six) hours as needed for mild pain.      albuterol (PROVENTIL HFA;VENTOLIN HFA) 108 (90 Base) MCG/ACT inhaler Inhale 1-2 puffs into the lungs every 6 (six) hours as needed for wheezing or shortness of breath.     aspirin EC 81 MG  tablet Take 81 mg by mouth daily.     atorvastatin (LIPITOR) 80 MG tablet Take 1 tablet (80 mg total) by mouth daily at 6 PM. 30 tablet 11   busPIRone (BUSPAR) 7.5 MG tablet Take 15 mg by mouth 3 (three) times daily.     DULoxetine (CYMBALTA) 30 MG capsule Take 30 mg by mouth daily. Take with 60 mg to equal 90 mg     DULoxetine (CYMBALTA) 60 MG capsule Take 60 mg by mouth daily. Take with 30 mg to equal 90 mg     FEROSUL 325 (65 Fe) MG tablet Take 325 mg by mouth every other day.     fexofenadine (ALLEGRA) 180 MG tablet Take 180 mg by mouth daily.     fluticasone (FLONASE) 50 MCG/ACT nasal spray Place 2 sprays into both nostrils daily as needed for allergies.      furosemide (LASIX) 20 MG tablet Take 1 tablet (20 mg total) by mouth daily. 30 tablet 0   gabapentin (NEURONTIN) 600 MG tablet Take 1,200 mg by mouth 3 (three) times daily.     ipratropium-albuterol (DUONEB) 0.5-2.5 (3) MG/3ML SOLN Take 3 mLs by nebulization every 6 (six) hours as needed (shortness of breath).     isosorbide mononitrate (IMDUR) 30 MG 24 hr tablet Take 1 tablet (30 mg total) by mouth daily. 90 tablet 3   lisinopril (PRINIVIL,ZESTRIL) 2.5 MG tablet Take 1 tablet (2.5 mg total) by mouth daily. 90 tablet 3   metFORMIN (GLUCOPHAGE) 500 MG tablet Take 500 mg by mouth daily with breakfast.     metoprolol tartrate (LOPRESSOR) 25 MG tablet Take 1 tablet (25 mg total) by mouth 2 (two) times daily. 180 tablet 3   nitroGLYCERIN (NITROSTAT) 0.4 MG SL tablet Place 1 tablet (0.4 mg total) under the tongue every 5 (five) minutes as needed for chest pain. 25 tablet 3   omeprazole (PRILOSEC) 20 MG capsule Take 20 mg by mouth daily.     oxyCODONE-acetaminophen (PERCOCET) 10-325 MG per tablet Take 1 tablet by mouth every 8 (eight) hours as needed for pain.     polyethylene glycol (MIRALAX / GLYCOLAX) 17 g packet Take 17 g by mouth 2 (two) times daily. Airport  each 0   senna-docusate (SENOKOT-S) 8.6-50 MG tablet Take 1 tablet by mouth at bedtime.  30 tablet 0   tiZANidine (ZANAFLEX) 4 MG tablet Take 4 mg by mouth 3 (three) times daily.     amoxicillin-clavulanate (AUGMENTIN) 875-125 MG tablet 1 tablet Orally every 12 hrs for 5 days     predniSONE (DELTASONE) 20 MG tablet Finished 1023/23      No facility-administered medications prior to visit.     Objective:     BP 126/82 (BP Location: Left Arm, Patient Position: Sitting)   Pulse 74   Temp 98.4 F (36.9 C) (Temporal)   Ht 5\' 2"  (1.575 m)   Wt 169 lb 9.6 oz (76.9 kg)   SpO2 90% Comment: room air- after patient walked from bathroom back to room. Off of oxygen for approx. 5 minutes.  BMI 31.02 kg/m   SpO2: 90 % (room air- after patient walked from bathroom back to room. Off of oxygen for approx. 5 minutes.)  Amb somber wf nad  HEENT : Oropharynx  clear /edentulous /  Nasal turbinates nl    NECK :  without  apparent JVD/ palpable Nodes/TM    LUNGS: no acc muscle use,  Mild barrel  contour chest wall with bilateral  Distant bs s audible wheeze and  without cough on insp or exp maneuvers  and mild  Hyperresonant  to  percussion bilaterally     CV:  RRR  no s3 or murmur or increase in P2, and no edema   ABD:  soft and nontender with pos end  insp Hoover's  in the supine position.  No bruits or organomegaly appreciated   MS:  Nl gait/ ext warm without deformities Or obvious joint restrictions  calf tenderness, cyanosis or clubbing     SKIN: warm and dry without lesions    NEURO:  alert, approp, nl sensorium with  no motor or cerebellar deficits apparent.      Assessment   No problem-specific Assessment & Plan notes found for this encounter.     , MD 08/12/2022

## 2022-08-12 NOTE — Patient Instructions (Addendum)
My office will be contacting you by phone for referral for PFTs  next available [facility] best choice since go there anyway) and see me the same day  - if you don't hear back from my office within one week please call us back or notify us thru MyChart and we'll address it right away.   Plan A = Automatic = Always=    Breztri Take 2 puffs first thing in am and then another 2 puffs about 12 hours later.    Work on inhaler technique:  relax and gently blow all the way out then take a nice smooth full deep breath back in, triggering the inhaler at same time you start breathing in.  Hold breath in for at least  5 seconds if you can. Blow out breztri  thru nose. Rinse and gargle with water when done.  If mouth or throat bother you at all,  try brushing teeth/gums/tongue with arm and hammer toothpaste/ make a slurry and gargle and spit out.       Plan B = Backup (to supplement plan A, not to replace it) Only use your albuterol inhaler as a rescue medication to be used if you can't catch your breath by resting or doing a relaxed purse lip breathing pattern.  - The less you use it, the better it will work when you need it. - Ok to use the inhaler up to 2 puffs  every 4 hours if you must but call for appointment if use goes up over your usual need - Don't leave home without it !!  (think of it like the spare tire for your car)    Plan C = Crisis (instead of Plan B but only if Plan B stops working) - only use your albuterol nebulizer if you first try Plan B and it fails to help > ok to use the nebulizer up to every 4 hours but if start needing it regularly call for immediate appointment  Make sure you check your oxygen saturation  AT  your highest level of activity (not after you stop)   to be sure it stays over 90% and adjust  02 flow upward to maintain this level if needed but remember to turn it back to previous settings when you stop (to conserve your supply).   Plug 02 into your cpap @ 2lpm - call  02 supplier to get the right connection  The key is to stop smoking completely before smoking completely stops you!

## 2022-08-13 ENCOUNTER — Telehealth: Payer: Self-pay | Admitting: Internal Medicine

## 2022-08-13 ENCOUNTER — Encounter: Payer: Self-pay | Admitting: Internal Medicine

## 2022-08-13 DIAGNOSIS — J9611 Chronic respiratory failure with hypoxia: Secondary | ICD-10-CM

## 2022-08-13 DIAGNOSIS — J449 Chronic obstructive pulmonary disease, unspecified: Secondary | ICD-10-CM

## 2022-08-13 DIAGNOSIS — F17210 Nicotine dependence, cigarettes, uncomplicated: Secondary | ICD-10-CM | POA: Insufficient documentation

## 2022-08-13 NOTE — Assessment & Plan Note (Signed)
Active smoker with PET CT c/w emphysema and AB 07/31/22  - 08/12/2022  After extensive coaching inhaler device,  effectiveness =    75% so try breztri 2bid and approp saba  Re SABA :  I spent extra time with pt today reviewing appropriate use of albuterol for prn use on exertion with the following points: 1) saba is for relief of sob that does not improve by walking a slower pace or resting but rather if the pt does not improve after trying this first. 2) If the pt is convinced, as many are, that saba helps recover from activity faster then it's easy to tell if this is the case by re-challenging : ie stop, take the inhaler, then p 5 minutes try the exact same activity (intensity of workload) that just caused the symptoms and see if they are substantially diminished or not after saba 3) if there is an activity that reproducibly causes the symptoms, try the saba 15 min before the activity on alternate days   If in fact the saba really does help, then fine to continue to use it prn but advised may need to look closer at the maintenance regimen being used to achieve better control of airways disease with exertion.

## 2022-08-13 NOTE — Telephone Encounter (Signed)
Order placed. Called patient and notified her that order was placed. Nothing further needed at this time.

## 2022-08-13 NOTE — Assessment & Plan Note (Addendum)
HC03  08/12/2022   =  32  - 08/12/2022 patient walked at a moderate pace on 2LO2 cont x 450 ft ft with . SOB starting after 150 ft but did not stop with  lowest sats 94%   Advised Make sure you check your oxygen saturation  AT  your highest level of activity (not after you stop)   to be sure it stays over 90% and adjust  02 flow upward to maintain this level if needed but remember to turn it back to previous settings when you stop (to conserve your supply).   Also needs to connect 02 to cpap unless cpap study was done on RA and did not desaturate/ advised

## 2022-08-13 NOTE — Assessment & Plan Note (Addendum)
Counseled re importance of smoking cessation but did not meet time criteria for separate billing     Reviewed results of CT/PET > no evidence of lung ca but smoking damage is significant.         Each maintenance medication was reviewed in detail including emphasizing most importantly the difference between maintenance and prns and under what circumstances the prns are to be triggered using an action plan format where appropriate.  Total time for H and P, chart review, counseling, reviewing hfa/neb/02 device(s) , directly observing portions of ambulatory 02 saturation study/ and generating customized AVS unique to this office visit / same day charting = 61 min new pt eval

## 2022-08-15 ENCOUNTER — Telehealth: Payer: Self-pay | Admitting: Internal Medicine

## 2022-08-26 NOTE — Telephone Encounter (Signed)
Closing encounter

## 2022-09-01 ENCOUNTER — Other Ambulatory Visit: Payer: Self-pay | Admitting: Internal Medicine

## 2022-09-01 DIAGNOSIS — J9611 Chronic respiratory failure with hypoxia: Secondary | ICD-10-CM

## 2022-09-08 ENCOUNTER — Ambulatory Visit: Payer: Medicare Other | Admitting: Cardiology

## 2022-09-29 ENCOUNTER — Ambulatory Visit (INDEPENDENT_AMBULATORY_CARE_PROVIDER_SITE_OTHER): Payer: Medicare Other | Admitting: Internal Medicine

## 2022-09-29 ENCOUNTER — Encounter: Payer: Self-pay | Admitting: Internal Medicine

## 2022-09-29 VITALS — BP 124/68 | HR 86 | Temp 98.2°F | Ht 62.0 in | Wt 170.6 lb

## 2022-09-29 DIAGNOSIS — J449 Chronic obstructive pulmonary disease, unspecified: Secondary | ICD-10-CM | POA: Diagnosis not present

## 2022-09-29 DIAGNOSIS — I10 Essential (primary) hypertension: Secondary | ICD-10-CM

## 2022-09-29 DIAGNOSIS — J9611 Chronic respiratory failure with hypoxia: Secondary | ICD-10-CM | POA: Diagnosis not present

## 2022-09-29 DIAGNOSIS — R0602 Shortness of breath: Secondary | ICD-10-CM | POA: Diagnosis not present

## 2022-09-29 DIAGNOSIS — F17210 Nicotine dependence, cigarettes, uncomplicated: Secondary | ICD-10-CM

## 2022-09-29 DIAGNOSIS — J440 Chronic obstructive pulmonary disease with acute lower respiratory infection: Secondary | ICD-10-CM

## 2022-09-29 DIAGNOSIS — J9612 Chronic respiratory failure with hypercapnia: Secondary | ICD-10-CM

## 2022-09-29 LAB — PULMONARY FUNCTION TEST
DL/VA % pred: 38 %
DL/VA: 1.67 ml/min/mmHg/L
DLCO cor % pred: 37 %
DLCO cor: 7.25 ml/min/mmHg
DLCO unc % pred: 37 %
DLCO unc: 7.25 ml/min/mmHg
FEF 25-75 Post: 2.57 L/sec
FEF 25-75 Pre: 2.68 L/sec
FEF2575-%Change-Post: -4 %
FEF2575-%Pred-Post: 101 %
FEF2575-%Pred-Pre: 106 %
FEV1-%Change-Post: 0 %
FEV1-%Pred-Post: 90 %
FEV1-%Pred-Pre: 89 %
FEV1-Post: 2.3 L
FEV1-Pre: 2.29 L
FEV1FVC-%Change-Post: 3 %
FEV1FVC-%Pred-Pre: 105 %
FEV6-%Change-Post: -2 %
FEV6-%Pred-Post: 84 %
FEV6-%Pred-Pre: 86 %
FEV6-Post: 2.66 L
FEV6-Pre: 2.73 L
FEV6FVC-%Pred-Post: 103 %
FEV6FVC-%Pred-Pre: 103 %
FVC-%Change-Post: -2 %
FVC-%Pred-Post: 82 %
FVC-%Pred-Pre: 84 %
FVC-Post: 2.66 L
FVC-Pre: 2.73 L
Post FEV1/FVC ratio: 86 %
Post FEV6/FVC ratio: 100 %
Pre FEV1/FVC ratio: 84 %
Pre FEV6/FVC Ratio: 100 %
RV % pred: 60 %
RV: 1.06 L
TLC % pred: 86 %
TLC: 4.11 L

## 2022-09-29 MED ORDER — VALSARTAN 80 MG PO TABS: 80.0000 mg | ORAL_TABLET | Freq: Every day | ORAL | 11 refills | Status: DC

## 2022-09-29 NOTE — Progress Notes (Signed)
Full PFT performed today. °

## 2022-09-29 NOTE — Patient Instructions (Signed)
Stop lisinopril and start valsartan 80 mg use one half daily  - ok to take whole pill if needed to keep systolic less than 120   Make sure you check your oxygen saturation  AT  your highest level of activity (not after you stop)   to be sure it stays over 90% and adjust  02 flow upward to maintain this level if needed but remember to turn it back to previous settings when you stop (to conserve your supply).    Please schedule a follow up visit in 6 months but call sooner if needed in [place] clinic

## 2022-09-29 NOTE — Patient Instructions (Signed)
Full PFT performed today. °

## 2022-09-29 NOTE — Progress Notes (Unsigned)
Tanya Lowe, female    DOB: 1970-11-25    MRN: [id number]   Brief patient profile:  29   yowf active smoker with allergic rhinitis worse in spring//asthma periodically more need albuterol x 2013  referred to pulmonary clinic in [place]  08/12/2022 by Orpah Cobb DO  for copd eval s/p admit.  Admit date:     07/10/2022  Discharge date: 07/15/22  Discharge Physician: Marguerita Merles, DO     PCP: Joana Reamer, DO    Recommendations at discharge:    Follow-up with PCP within 1 to 2 weeks and repeat CBC, CMP, mag, Phos within 1 week Follow-up with cardiology within 1 to 2 weeks Follow-up and evaluate for outpatient  mediastinal lymphadenopathy with either PET scan and tissue biopsy   Discharge Diagnoses: Principal Problem:   Congestive heart failure (CHF) (HCC)   Obesity   Tobacco abuse   Essential hypertension   Elevated brain natriuretic peptide (BNP) level   Microcytic anemia   GERD (gastroesophageal reflux disease)   Type 2 diabetes mellitus (HCC)   CAD (coronary artery disease)   COPD (chronic obstructive pulmonary disease) (HCC)   Hypoalbuminemia   Lymphadenopathy   Mediastinal lymphadenopathy   Hilar lymphadenopathy   Hypokalemia   Chronic back pain      Hospital Course: HPI per Dr. Frankey Shown on 07/10/22 Tanya Lowe is a 51 y.o. female with medical history significant of CAD status post stent placements x 3, COPD, GERD, hypertension, tobacco abuse who presents to the emergency department due to low level of oxygen noted in her PCPs office.  Patient states that she was recently admitted at [place], [place] about 2 weeks ago due to pneumonia, she states that she has not been back to her baseline since being discharged from the hospital.  She followed up with her PCP today and was slated to be hypoxic with O2 sat in the mid 80s, so an EMS was activated and patient was taken to the ED for further evaluation and management.  Patient endorsed a midsternal  pressure which occurred just prior to arrival of the EMS team.  She was treated with 1 dose of aspirin and 1 nitroglycerin sublingual by EMS team en route to the hospital with an improvement in the chest pain.  Patient states that she was recently diagnosed with COPD during a recent hospitalization.  She denies fever, chills, nausea, vomiting, headache, blurry vision.    ED Course:  In the emergency department, she was hemodynamically stable on arrival to the ED except being hypoxic with O2 sat of 87%, supplemental oxygen via [place] at 2 LPM was provided and O2 sats improved to 97%.  Work-up in the ED showed microcytic anemia, BMP was normal, troponin x2 was flat at 6, BNP was 862.  SARS coronavirus 2 was negative. Chest x-ray showed: Central pulmonary vessels are more prominent. Increased interstitial markings are seen in parahilar regions and lower lung fields, more so on the right side. Findings suggest interstitial edema or interstitial pneumonia. There is no focal pulmonary consolidation. There is no pleural effusion. IV Lasix 40 mg x 1 was given.  Hospitalist was asked to admit patient for further evaluation and management.   **Interim History Patient was given IV Lasix and started diuresing.  Cardiology was notified and echocardiogram was ordered and done.  She remains on supplemental oxygen but notes that her legs are less swollen today feels little bit better.  Echocardiogram showed a normal EF of 65  to 70% with no regional wall motion abnormalities but the right ventricle apex appeared to be thickened more than normally and right ventricular function was different with the systolic function being mild to moderately reduced and the right ventricular size being mildly enlarged.  Because of this the cardiology team was concerned that she had a PE so she underwent a CTA PE scan which was negative for any PE but did show abnormally enlarged lymph nodes in the mediastinum and both hilar regions with  possible increase in size with findings suggesting possible active inflammatory or neoplastic process and recommendations for follow-up PET scan and tissue sampling and she also had diffuse groundglass dispensaries seen in both lungs suggesting possible pulmonary edema or multifocal pneumonia with small bilateral pleural effusions more so on the right.   We will continue IV Lasix per cardiology recommendations and try and wean her off of supplemental oxygen.  Cardiology felt that she is stable from their perspective and recommended changing diuresis to p.o.  Unfortunately she is not able to be weaned off of supplemental oxygen so we will discharge her home on 2 L supplemental oxygen she will need to follow-up with PCP, pulmonary and cardiology outpatient setting.  She is much more euvolemic and stable for discharge at this time   Assessment and Plan:   Acute respiratory failure with hypoxia in the setting of suspected new onset right ventricular dysfunction and congestive heart failure, stable -Chest x-ray done and was suggestive of interstitial edema and interstitial pneumonia patient was recently treated for pneumonia at [facility] and doing well [place]; Official read showed "Central pulmonary vessels are more prominent. Increased interstitial markings are seen in parahilar regions and lower lung fields, more so on the right side. Findings suggest interstitial edema or interstitial pneumonia. There is no focal pulmonary consolidation. There is no pleural effusion." -Was placed on supplemental oxygen due to her oxygen saturations being in the 80s -SpO2: 98 % O2 Flow Rate (L/min): 3 L/min -Continuous pulse oximetry and maintain O2 saturations greater than 90% -Continue supplemental oxygen via nasal cannula wean O2 as tolerated to room air -Had a echocardiogram done previously last year which showed an EF of 65 to 70% with a left ventricular function being normal and had no RWMA but did have mild LVH  and left ventricular diastolic parameters were normal at that time -She was initiated on IV Lasix in the ED and will continue IV Lasix 40 mg twice daily today -BNP was elevated at 862.0 on admission and trending down and was 543.0 and is now 345.0 yesterday and now 201 at the time of discharge -We will continue lisinopril -Strict I's and O's and daily weights; she is -3.08 L since admission and weight is down 13 lbs  -Repeat chest x-ray in a.m. and echocardiogram this visit -Placed on a cardiac diet -She will need an ambulatory home O2 screen prior to discharge and she desaturated so she will require 2 L of supplemental oxygen -Cardiology consulted for further evaluation recommendations and echocardiogram was done and below and cardiology recommended evaluating for PE which was negative -CT scan done and showed diffuse groundglass opacities seen in both lungs suggesting possibly pulmonary edema multifocal pneumonia; unlikely pneumonia given that she is afebrile and has no white count has no cough or sputum production but the CT scan done did show that there is thickening of the interlobular septa in the lower lung fields suggesting possible edema was chronic and she did have small bilateral pleural  effusions worse on the right -Cardiology recommends continuing additional diuresis and following labs and they are recommending that she would need a follow-up echo in outpatient setting -Repeat chest x-ray done and showed "Improved LEFT basilar lung aeration with some persistent opacities likely reflecting atelectasis." -Repeat chest x-ray in the outpatient setting   Chest pain rule out ACS -Chest pain is likely resolved and she is complaining chest pain resolved after nitroglycerin -She does have a history of CAD with stent placement x3 -Troponin x2 was flat at 6 -Cardiology has been consulted and appreciate further evaluation recommendations -In the interim we will continue aspirin 81 mg p.o. daily,  atorvastatin 80 mg p.o. daily, Toprol tartrate as well as nitroglycerin 0.4 mg and lisinopril 2.5 mg p.o. daily -Follow-up with cardiology in outpatient setting   GERD/GI prophylaxis -Continue with pantoprazole 40 g p.o. daily   Microcytic Anemia -Patient's hemoglobin/hematocrit is now 10.1/35.3 with MCV of 80.4 yesterday and today is now 10.9/38.3 with an MCV of 80.1 -Anemia panel was checked and showed an iron level of 20, UIBC 379, TIBC 399, saturation ratios of 5%, ferritin level 41, folate of 13.3 -Continue with ferrous sulfate 325 mg p.o. every other day -Continue monitor for signs and symptoms bleeding; no overt bleeding noted -Repeat CBC within 1 week   Lymphadenopathy -She has abnormally enlarged lymph nodes in the mediastinum and both hilar regions with possible increased size concerning for active inflammatory or neoplastic process -Recommending following up with a PET CT scan and tissue sampling this can be done in outpatient setting   Essential Hypertension -Continue diuresis IV Lasix 40 mg twice daily, metoprolol tartrate as well as lisinopril -Last blood pressure reading is on the softer side at 113/71   Hypokalemia -Patient's potassium 3.8 -Patient's magnesium level is 2.0 -Continue monitor and trend and replete as necessary -Repeat CMP in a.m.   Diabetes Mellitus Type 2 -Hemoglobin A1c is now 5.8 -Continue moderate NovoLog sign scale insulin AC and monitor CBGs per protocol and adjust insulin regimen as necessary -CBGs ranging from 108-177   COPD -Continue with DuoNeb 3 MLS every 6 as needed for shortness of breath as well as albuterol 3 mL IH every 6 as needed for wheezing or shortness of breath -Currently not in exacerbation but did desaturate on home ambulatory screen and will likely require home supplemental oxygen for discharge -Currently not wheezing and improved   Hypoalbuminemia -Patient's Albumin Level is now 2.8 -> 3.0 and is now 3.1 -Continue to  Monitor and Trend and repeat CMP in the AM    Chronic back pain -Continue with oxycodone IR 10 mg - 325 mg every 8 hours as needed moderate and severe pain along with tizanidine 4 mg p.o. 3 times daily as well as gabapentin 1200 mg p.o. 3 times daily   Tobacco Abuse -Smoking cessation counseling given -If necessary will place on Nicotine Patch however she declines nicotine patch and nicotine gum   Obesity -Complicates overall prognosis and care -Estimated body mass index is 30.56 kg/m as calculated from the following:   Height as of this encounter: 5\' 2"  (1.575 m).   Weight as of this encounter: 75.8 kg.  -Weight Loss and Dietary Counseling given       History of Present Illness  08/12/2022  Pulmonary/ 1st office eval/ Cordel Drewes / [facility]  no maint rx Chief Complaint  Patient presents with   Consult    Consult for copd abnormal CT scan. Sent home from hospital on [place]  Dyspnea:  with 02 2lpm doing better and able to do [employer]/ uses [facility] parking  Cough: periodically yellowish brown  Sleep: on cpap s 02 SABA use: very confused with instructions on how/when to use which one but neb helps the most 02 :  2lpm daytime but not adjusting  Rec My office will be contacting you by phone for referral for PFTs    Plan A = Automatic = Always=    Breztri Take 2 puffs first thing in am and then another 2 puffs about 12 hours later.   Work on inhaler technique:   Plan B = Backup (to supplement plan A, not to replace it) Only use your albuterol inhaler as a rescue medication Plan C = Crisis (instead of Plan B but only if Plan B stops working) - only use your albuterol nebulizer if you first try Plan B  Make sure you check your oxygen saturation  AT  your highest level of activity (not after you stop)   to be sure it stays over 90%  Plug 02 into your cpap @ 2lpm - call 02 supplier to get the right connection  The key is to stop smoking completely before smoking completely stops  you!   09/29/2022  f/u ov/Roland Prine re: doe  maint on Breztri  /02  s airflow obst on PFTs 09/29/2022 p breztri prior Chief Complaint  Patient presents with   Follow-up    Review PFT today.  Doing well today   Dyspnea:  able to do  [employer] on 2lpm  Cough: none x  after am breztri Sleeping: cpap no 02  SABA use: none  02: 2lpm when walking but not checking sats  Covid status:   vax none/ infected x one      No obvious day to day or daytime variability or assoc excess/ purulent sputum or mucus plugs or hemoptysis or cp or chest tightness, subjective wheeze or overt sinus or hb symptoms.   Sleeping as above  without nocturnal  or early am exacerbation  of respiratory  c/o's or need for noct saba. Also denies any obvious fluctuation of symptoms with weather or environmental changes or other aggravating or alleviating factors except as outlined above   No unusual exposure hx or h/o childhood pna/ asthma or knowledge of premature birth.  Current Allergies, Complete Past Medical History, Past Surgical History, Family History, and Social History were reviewed in [employer] record.  ROS  The following are not active complaints unless bolded Hoarseness, sore throat, dysphagia, dental problems, itching, sneezing,  nasal congestion or discharge of excess mucus or purulent secretions, ear ache,   fever, chills, sweats, unintended wt loss or wt gain, classically pleuritic or exertional cp,  orthopnea pnd or arm/hand swelling  or leg swelling, presyncope, palpitations, abdominal pain, anorexia, nausea, vomiting, diarrhea  or change in bowel habits or change in bladder habits, change in stools or change in urine, dysuria, hematuria,  rash, arthralgias, visual complaints, headache, numbness, weakness or ataxia or problems with walking or coordination,  change in mood or  memory.        Current Meds  Medication Sig   acetaminophen (TYLENOL) 500 MG tablet Take 1,000 mg by mouth  every 6 (six) hours as needed for mild pain.    albuterol (PROVENTIL HFA;VENTOLIN HFA) 108 (90 Base) MCG/ACT inhaler Inhale 1-2 puffs into the lungs every 6 (six) hours as needed for wheezing or shortness of breath.   aspirin EC 81 MG  tablet Take 81 mg by mouth daily.   atorvastatin (LIPITOR) 80 MG tablet Take 1 tablet (80 mg total) by mouth daily at 6 PM.   Budeson-Glycopyrrol-Formoterol (BREZTRI AEROSPHERE) 160-9-4.8 MCG/ACT AERO Inhale 2 puffs into the lungs 2 (two) times daily. Take 2 puffs first thing in am and then another 2 puffs about 12 hours later.   busPIRone (BUSPAR) 7.5 MG tablet Take 15 mg by mouth 3 (three) times daily.   DULoxetine (CYMBALTA) 30 MG capsule Take 30 mg by mouth daily. Take with 60 mg to equal 90 mg   DULoxetine (CYMBALTA) 60 MG capsule Take 60 mg by mouth daily. Take with 30 mg to equal 90 mg   FEROSUL 325 (65 Fe) MG tablet Take 325 mg by mouth every other day.   fexofenadine (ALLEGRA) 180 MG tablet Take 180 mg by mouth daily.   fluticasone (FLONASE) 50 MCG/ACT nasal spray Place 2 sprays into both nostrils daily as needed for allergies.    gabapentin (NEURONTIN) 600 MG tablet Take 1,200 mg by mouth 3 (three) times daily.   ipratropium-albuterol (DUONEB) 0.5-2.5 (3) MG/3ML SOLN Take 3 mLs by nebulization every 6 (six) hours as needed (shortness of breath).   isosorbide mononitrate (IMDUR) 30 MG 24 hr tablet Take 1 tablet (30 mg total) by mouth daily.   lisinopril (PRINIVIL,ZESTRIL) 2.5 MG tablet Take 1 tablet (2.5 mg total) by mouth daily.   metFORMIN (GLUCOPHAGE) 500 MG tablet Take 500 mg by mouth daily with breakfast.   metoprolol tartrate (LOPRESSOR) 25 MG tablet Take 1 tablet (25 mg total) by mouth 2 (two) times daily.   nitroGLYCERIN (NITROSTAT) 0.4 MG SL tablet Place 1 tablet (0.4 mg total) under the tongue every 5 (five) minutes as needed for chest pain.   omeprazole (PRILOSEC) 20 MG capsule Take 20 mg by mouth daily.   oxyCODONE-acetaminophen (PERCOCET) 10-325  MG per tablet Take 1 tablet by mouth every 8 (eight) hours as needed for pain.   polyethylene glycol (MIRALAX / GLYCOLAX) 17 g packet Take 17 g by mouth 2 (two) times daily.   senna-docusate (SENOKOT-S) 8.6-50 MG tablet Take 1 tablet by mouth at bedtime.   tiZANidine (ZANAFLEX) 4 MG tablet Take 4 mg by mouth 3 (three) times daily.                      Past Medical History:  Diagnosis Date   Arthritis    Bilateral hand pain    CAD (coronary artery disease)    a. s/p NSTEMI in 2016 with DES to mid-RCA, DES to [facility] and DES to mid-LAD b. low-risk NST in 05/2016 and 11/2018   Cervical radiculopathy    Chronic neck and back pain    Emphysema lung (HCC)    Fibromyalgia    Hypertension    Kidney stones    hx of w previous lithotripsy   MI (myocardial infarction) ([place])    Pain management    [facility]   Thoracic back pain        Objective:    Wt Readings from Last 3 Encounters:  09/29/22 170 lb 9.6 oz (77.4 kg)  08/12/22 169 lb 9.6 oz (76.9 kg)  08/07/22 168 lb 6.4 oz (76.4 kg)    Vital signs reviewed  09/29/2022  - Note at rest 02 sats  98% on 2lpm    General appearance:    amb  wf nad       HEENT : Oropharynx  clear   Nasal turbinates  nl    NECK :  without  apparent JVD/ palpable Nodes/TM    LUNGS: no acc muscle use,  Min barrel  contour chest wall with bilateral  slightly decreased bs s audible wheeze and  without cough on insp or exp maneuvers and min  Hyperresonant  to  percussion bilaterally    CV:  RRR  no s3 or murmur or increase in P2, and no edema   ABD:  soft and nontender with pos end  insp Hoover's  in the supine position.  No bruits or organomegaly appreciated   MS:  Nl gait/ ext warm without deformities Or obvious joint restrictions  calf tenderness, cyanosis or clubbing     SKIN: warm and dry without lesions    NEURO:  alert, approp, nl sensorium with  no motor or cerebellar deficits apparent.             I personally reviewed images and agree  with radiology impression as follows:   Chest CT PET  07/31/22 1. Enlarged mediastinal and hilar lymph nodes demonstrate low level hypermetabolism.   No significant change when compared to the March 2023 CT scan. CT surveillance is suggested. No enlarged neck,supraclavicular, abdominal/pelvic or inguinal nodes. Follow-up CT scan in March 2024 which would be a 1 year follow-up from the initial chest CT. 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 The lower lung zones demonstrate a moderate mosaic pattern of ground-glassattenuation which can be seen with small airways disease such as asthma or bronchiolitis. Hypersensitivity pneumonitis andcryptogenic organizing pneumonia are other possibilities    Assessment

## 2022-10-01 ENCOUNTER — Encounter: Payer: Self-pay | Admitting: Internal Medicine

## 2022-10-01 NOTE — Assessment & Plan Note (Signed)
Active smoker with PET CT c/w emphysema and AB 07/31/22  - 08/12/2022  After extensive coaching inhaler device,  effectiveness =    75% so try breztri 2bid and approp saba - PFT's  09/29/22   FEV1 2.29 (89 % ) ratio 0.84  p 0 % improvement from saba p Breztri  prior to study with DLCO  7.25 (37%)   and FV curve nl   She does not have significant COPD but likely AB based on response to breztri > no changes needed

## 2022-10-01 NOTE — Assessment & Plan Note (Signed)
HC03  08/12/2022   =  30  - 08/12/2022 patient walked at a moderate pace on 2LO2 cont x 450 ft ft with . SOB starting after 150 ft but did not stop with  lowest sats 94%   Again advised: Make sure you check your oxygen saturation  AT  your highest level of activity (not after you stop)   to be sure it stays over 90% and adjust  02 flow upward to maintain this level if needed but remember to turn it back to previous settings when you stop (to conserve your supply).

## 2022-10-01 NOTE — Assessment & Plan Note (Signed)
Counseled re importance of smoking cessation but did not meet time criteria for separate billing           Each maintenance medication was reviewed in detail including emphasizing most importantly the difference between maintenance and prns and under what circumstances the prns are to be triggered using an action plan format where appropriate.  Total time for H and P, chart review, counseling, reviewing hfa/02 device(s) and generating customized AVS unique to this office visit / same day charting = 20 min

## 2022-10-01 NOTE — Assessment & Plan Note (Signed)
Try off acei 09/29/2022 due to airway symptoms >> pft changes   ACE inhibitors are problematic in  pts with airway complaints because  even experienced pulmonologists can't always distinguish ace effects from copd/asthma.  By themselves they don't actually cause a problem, much like oxygen can't by itself start a fire, but they certainly serve as a powerful catalyst or enhancer for any "fire"  or inflammatory process in the upper airway, be it caused by an ET  tube or more commonly reflux (especially in the obese or pts with known GERD or who are on biphoshonates).    In the era of ARB near equivalency until we have a better handle on the reversibility of the airway problem, it just makes sense to avoid ACEI  entirely in the short run and then decide later, having established a level of airway control using a reasonable limited regimen, whether to add back ace but even then being very careful to observe the pt for worsening airway control and number of meds used/ needed to control symptoms.    >>>Try diovan 80 mg one half daily instead until return

## 2022-10-24 IMAGING — US US ABDOMEN LIMITED
1 series · 14 of 25 positions shown · non-contrast
Comparison: None.

CLINICAL DATA: Epigastric pain

EXAM:
ULTRASOUND ABDOMEN LIMITED RIGHT UPPER QUADRANT

[Series 1: us abdomen limited ruq (liver/gb) · 14 of 110 slices shown]
[im 1/110]
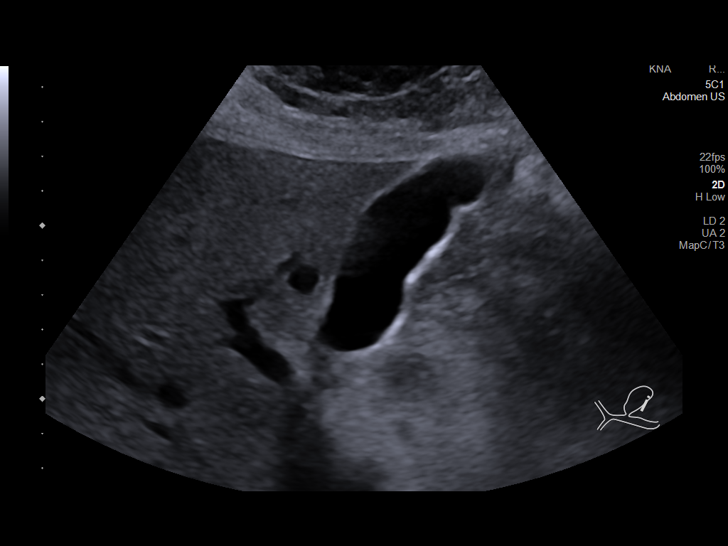
[im 10/110]
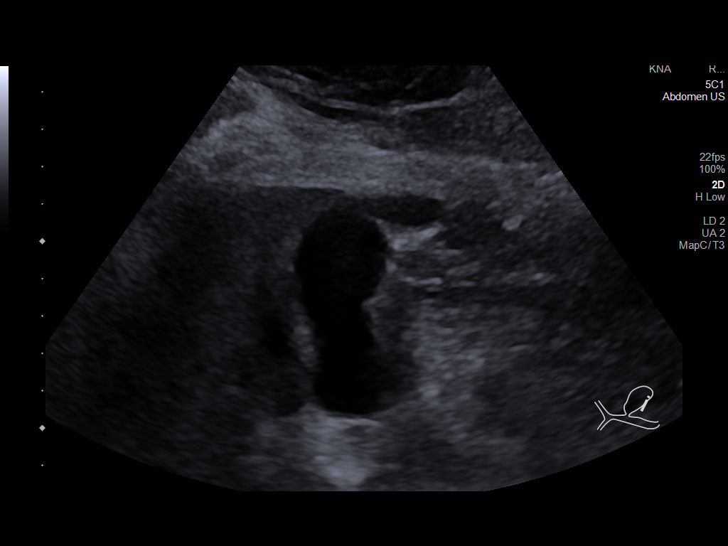
[im 19/110]
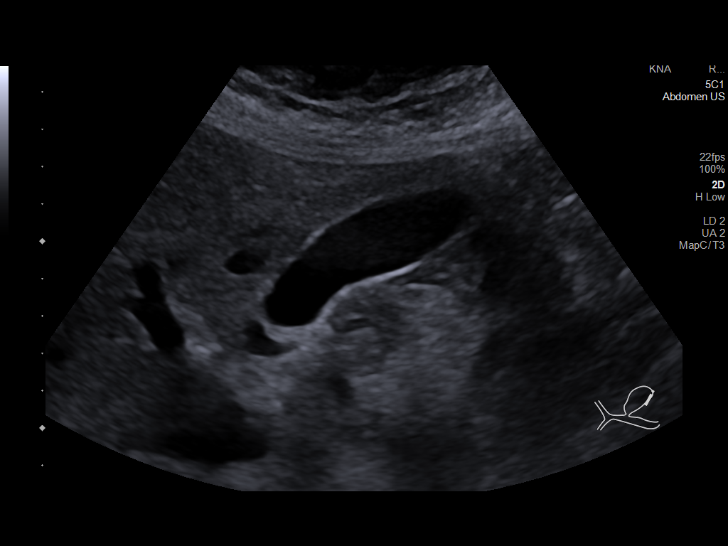
[im 28/110]
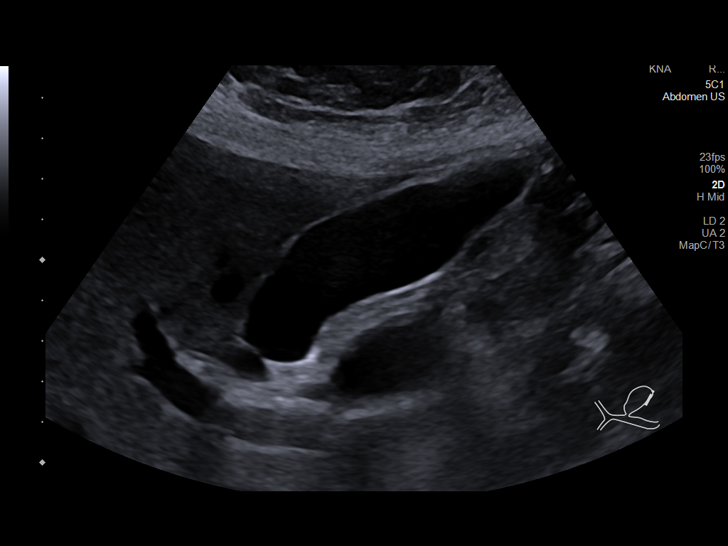
[im 37/110]
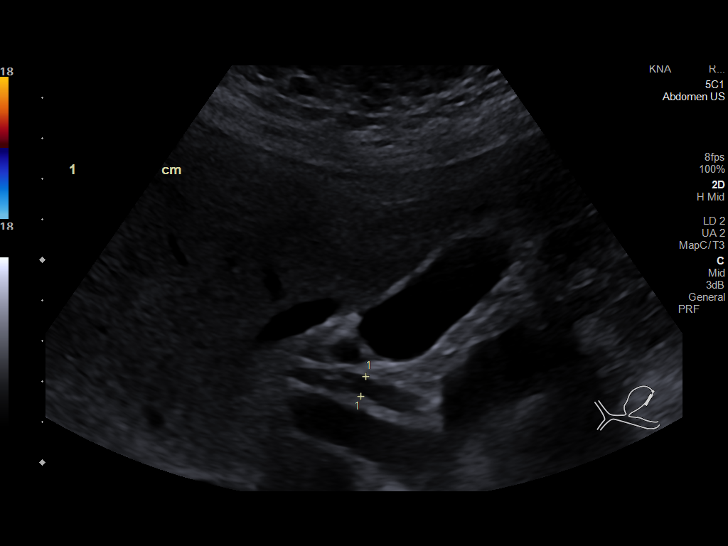
[im 41/110]
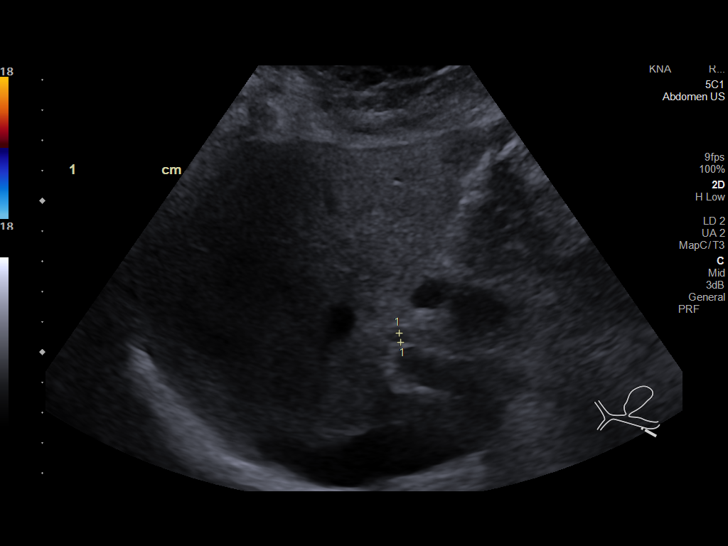
[im 50/110]
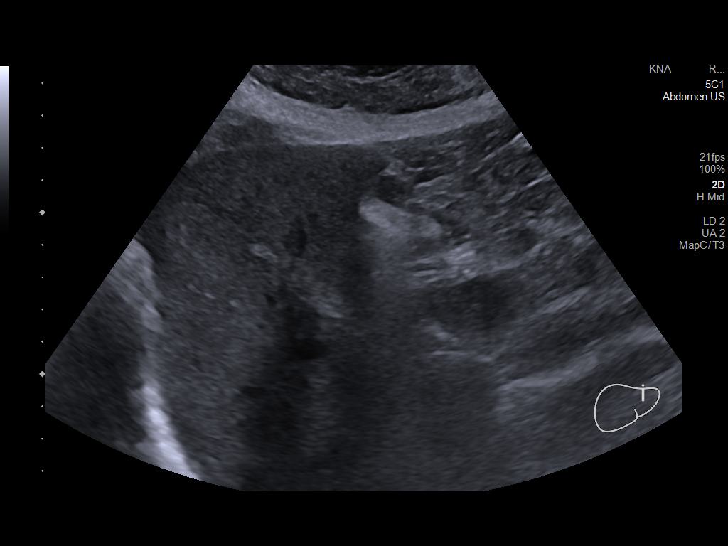
[im 60/110]
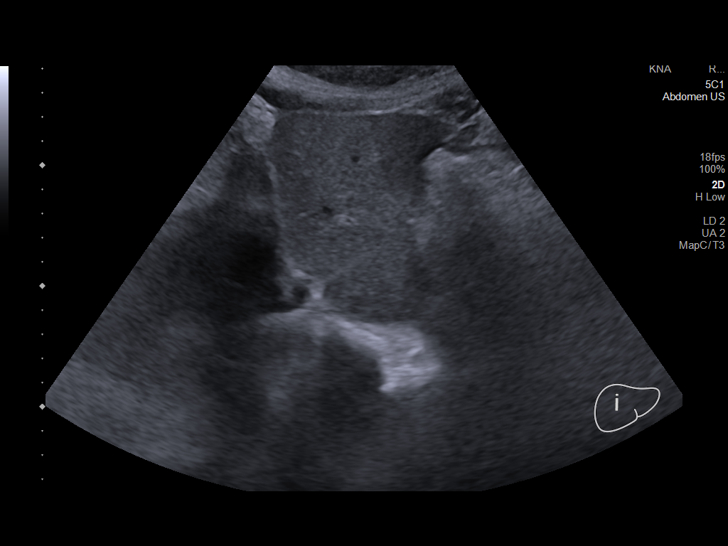
[im 69/110]
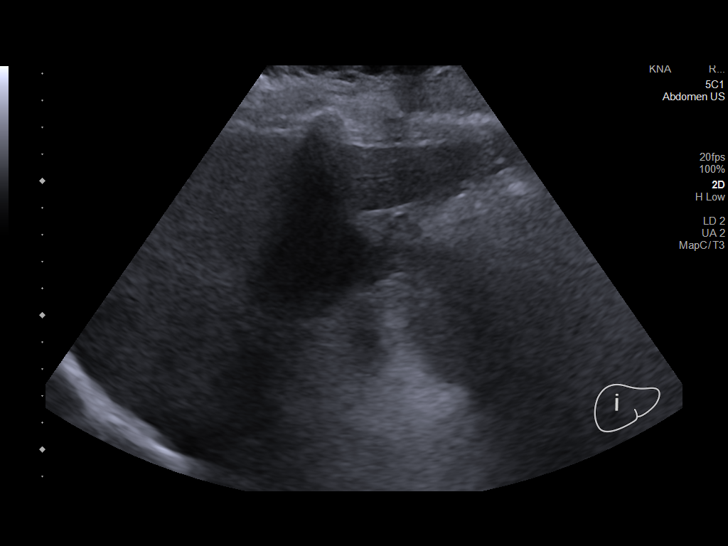
[im 73/110]
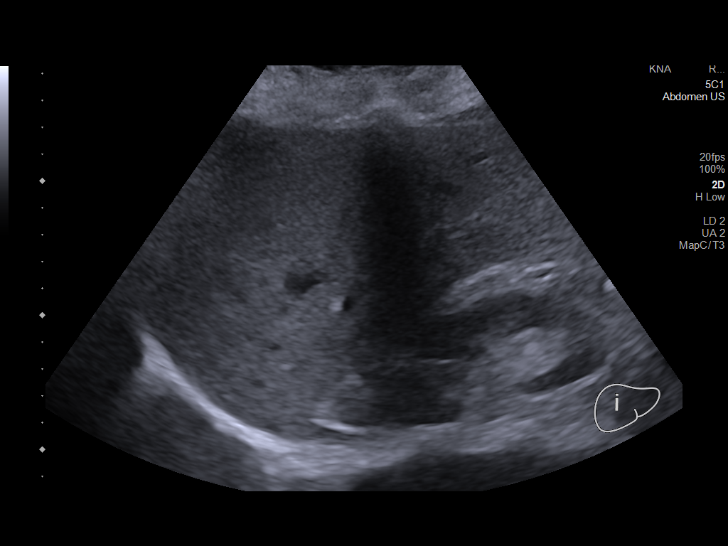
[im 82/110]
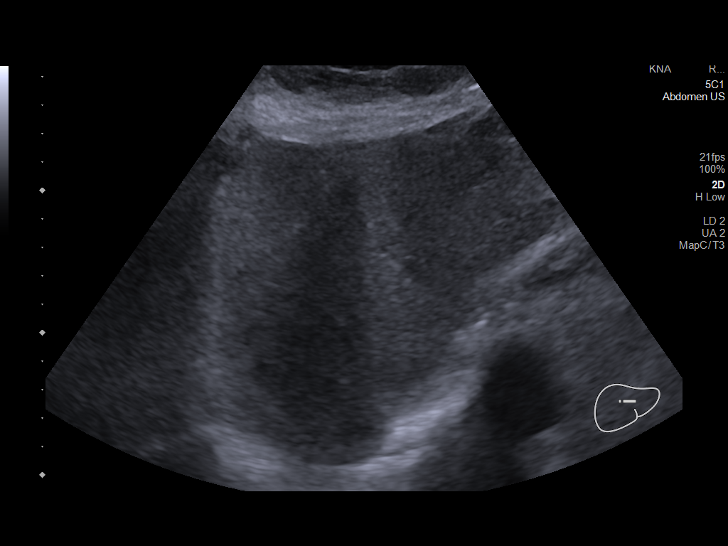
[im 91/110]
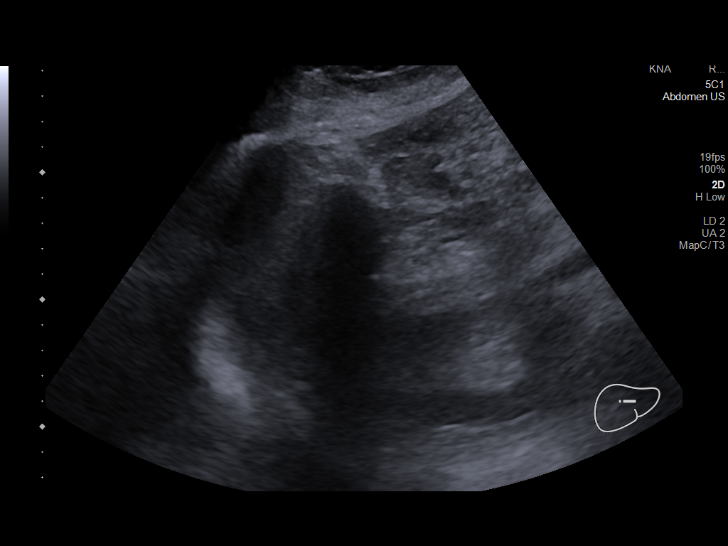
[im 100/110]
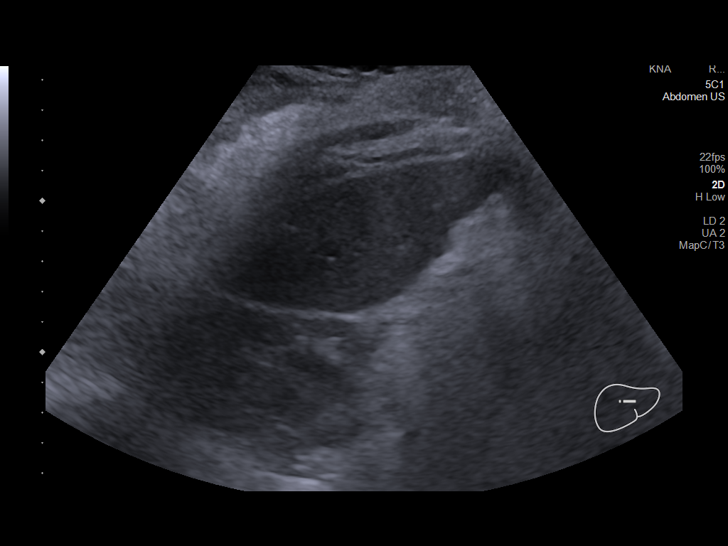
[im 110/110]
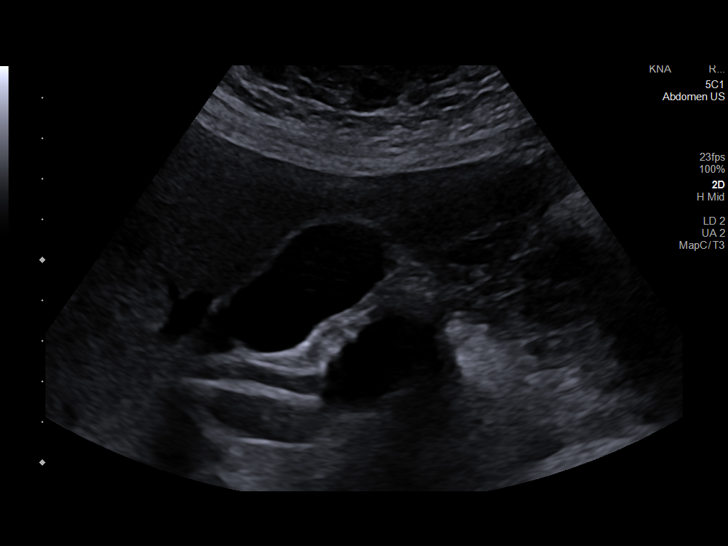

[14 of 25 positions shown; findings below may reference images not displayed]

FINDINGS: Gallbladder:

The patient was tender during scanning of the gallbladder. The
gallbladder is normal in appearance with no wall thickening,
pericholecystic fluid, stones, or sludge.

Common bile duct:

Diameter: 5.1 mm

Liver:

A micronodular contour was suggested by the sonographer not
appreciated by myself. A CT scan from January 14, 2022 demonstrated a
smooth border without nodularity. The liver is grossly unremarkable.
Portal vein is patent on color Doppler imaging with normal direction
of blood flow towards the liver.

Other: None.
IMPRESSION: 1. The patient was tender during scanning of the gallbladder. The
gallbladder is normal in appearance however. Recommend clinical
correlation.
2. No other significant abnormalities.

## 2022-12-01 ENCOUNTER — Other Ambulatory Visit (HOSPITAL_COMMUNITY): Payer: Self-pay | Admitting: Family Medicine

## 2022-12-01 DIAGNOSIS — F17200 Nicotine dependence, unspecified, uncomplicated: Secondary | ICD-10-CM

## 2022-12-01 DIAGNOSIS — R911 Solitary pulmonary nodule: Secondary | ICD-10-CM

## 2022-12-01 DIAGNOSIS — R591 Generalized enlarged lymph nodes: Secondary | ICD-10-CM

## 2023-01-16 ENCOUNTER — Ambulatory Visit: Payer: Medicare Other | Admitting: Cardiology

## 2023-01-20 ENCOUNTER — Ambulatory Visit (HOSPITAL_COMMUNITY): Payer: Medicare Other

## 2023-01-29 ENCOUNTER — Ambulatory Visit (HOSPITAL_COMMUNITY)
Admission: RE | Admit: 2023-01-29 | Discharge: 2023-01-29 | Disposition: A | Discharge status: Home / Self Care | Payer: Medicare Other | Source: Ambulatory Visit | Attending: Family Medicine | Admitting: Family Medicine

## 2023-01-29 DIAGNOSIS — R591 Generalized enlarged lymph nodes: Secondary | ICD-10-CM | POA: Diagnosis present

## 2023-01-29 DIAGNOSIS — F17200 Nicotine dependence, unspecified, uncomplicated: Secondary | ICD-10-CM | POA: Insufficient documentation

## 2023-01-29 DIAGNOSIS — R911 Solitary pulmonary nodule: Secondary | ICD-10-CM | POA: Insufficient documentation

## 2023-02-03 ENCOUNTER — Encounter: Payer: Self-pay | Admitting: Cardiology

## 2023-02-03 ENCOUNTER — Ambulatory Visit: Payer: Medicare Other | Attending: Cardiology | Admitting: Cardiology

## 2023-02-03 ENCOUNTER — Encounter: Payer: Self-pay | Admitting: Family Medicine

## 2023-02-03 VITALS — BP 114/68 | HR 97 | Ht 62.5 in | Wt 175.0 lb

## 2023-02-03 DIAGNOSIS — I5032 Chronic diastolic (congestive) heart failure: Secondary | ICD-10-CM

## 2023-02-03 DIAGNOSIS — E782 Mixed hyperlipidemia: Secondary | ICD-10-CM

## 2023-02-03 DIAGNOSIS — I25118 Atherosclerotic heart disease of native coronary artery with other forms of angina pectoris: Secondary | ICD-10-CM | POA: Diagnosis not present

## 2023-02-03 DIAGNOSIS — I10 Essential (primary) hypertension: Secondary | ICD-10-CM

## 2023-02-03 NOTE — Patient Instructions (Signed)
Medication Instructions:  Your physician recommends that you continue on your current medications as directed. Please refer to the Current Medication list given to you today.  *If you need a refill on your cardiac medications before your next appointment, please call your pharmacy*   Lab Work: None If you have labs (blood work) drawn today and your tests are completely normal, you will receive your results only by: MyChart Message (if you have MyChart) OR A paper copy in the mail If you have any lab test that is abnormal or we need to change your treatment, we will call you to review the results.   Testing/Procedures: None   Follow-Up: At  [facility], you and your health needs are our priority.  As part of our continuing mission to provide you with exceptional heart care, we have created designated Provider Care Teams.  These Care Teams include your primary Cardiologist (physician) and Advanced Practice Providers (APPs -  Physician Assistants and Nurse Practitioners) who all work together to provide you with the care you need, when you need it.  We recommend signing up for the patient portal called "MyChart".  Sign up information is provided on this After Visit Summary.  MyChart is used to connect with patients for Virtual Visits (Telemedicine).  Patients are able to view lab/test results, encounter notes, upcoming appointments, etc.  Non-urgent messages can be sent to your provider as well.   To learn more about what you can do with MyChart, go to [email or web address].    Your next appointment:   6 month(s)  Provider:   Jonathan Branch, MD    Other Instructions    

## 2023-02-03 NOTE — Progress Notes (Signed)
Clinical Summary [name] is a 52 y.o.female seen today for follow up of the following medical problems.    1. CAD - history of NSTEMI Jan 2016, s/p DES to mid RCA and DES to distal RCA and DES to mid LAD.   - 04/2015 echo LVEF 55-60% -11/2015 DSE without ischemia - 05/2016 nuclear stress [place] no clear ischemia, limited by signicant gut radiotrace uptake   11/2018 nuclear stress: no ischemia      Jan 2022 echo: LVEF 65-70%, no WMAs, normal diastolic, normal RV - no chest pains, chronic SOB up and down. On home O2 2L     2. SOB/COPD/Lymphadenopathy Some recent SOB, recently diagnosed with emphysema.  12/2021. DOE with activities, occasional cough/wheezing. CT chest did show some emphysema. Will use prn albuterol with benefit.  09/2022 PFTs: severe diffusion defect - breathing much improve breztri     3. HTN -she is compliant with meds   4. Hyperlipidemia 07/2017 TC 103 TG 106 HDL 39 LDL 45 -- she is compliant with statin   - 12/2020 TG 15 TC 92 HDL 31 LDL 39 - labs followed by pcp   5. OSA - followed by Dr Frances Furbish neurology - she remains compliant with CPAP   6. Chronic back pain - prior nerve stimulator surgery     7. DM2 - followed by pcp  8. HFpEF  - 06/2022 echo: LVEF 65-70%, mild to mod RV dysfunction, diastolic not described  - no recent edema - chronic SOB on home O2 for COPD Past Medical History:  Diagnosis Date   Arthritis    Bilateral hand pain    CAD (coronary artery disease)    a. s/p NSTEMI in 2016 with DES to mid-RCA, DES to [facility] and DES to mid-LAD b. low-risk NST in 05/2016 and 11/2018   Cervical radiculopathy    Chronic neck and back pain    Emphysema lung (HCC)    Fibromyalgia    Hypertension    Kidney stones    hx of w previous lithotripsy   MI (myocardial infarction) (HCC)    Pain management    [facility]   Thoracic back pain      Allergies  Allergen Reactions   Lyrica [Pregabalin] Shortness Of Breath    Chest Pain     Bactrim [Sulfamethoxazole-Trimethoprim] Rash   Prednisone Palpitations and Hypertension          Current Outpatient Medications  Medication Sig Dispense Refill   acetaminophen (TYLENOL) 500 MG tablet Take 1,000 mg by mouth every 6 (six) hours as needed for mild pain.      albuterol (PROVENTIL HFA;VENTOLIN HFA) 108 (90 Base) MCG/ACT inhaler Inhale 1-2 puffs into the lungs every 6 (six) hours as needed for wheezing or shortness of breath.     aspirin EC 81 MG tablet Take 81 mg by mouth daily.     atorvastatin (LIPITOR) 80 MG tablet Take 1 tablet (80 mg total) by mouth daily at 6 PM. 30 tablet 11   Budeson-Glycopyrrol-Formoterol (BREZTRI AEROSPHERE) 160-9-4.8 MCG/ACT AERO Inhale 2 puffs into the lungs 2 (two) times daily. Take 2 puffs first thing in am and then another 2 puffs about 12 hours later. 10.7 g 11   busPIRone (BUSPAR) 7.5 MG tablet Take 15 mg by mouth 3 (three) times daily.     DULoxetine (CYMBALTA) 30 MG capsule Take 30 mg by mouth daily. Take with 60 mg to equal 90 mg     DULoxetine (CYMBALTA) 60 MG  capsule Take 60 mg by mouth daily. Take with 30 mg to equal 90 mg     FEROSUL 325 (65 Fe) MG tablet Take 325 mg by mouth every other day.     fexofenadine (ALLEGRA) 180 MG tablet Take 180 mg by mouth daily.     fluticasone (FLONASE) 50 MCG/ACT nasal spray Place 2 sprays into both nostrils daily as needed for allergies.      furosemide (LASIX) 20 MG tablet Take 1 tablet (20 mg total) by mouth daily. 30 tablet 0   gabapentin (NEURONTIN) 600 MG tablet Take 1,200 mg by mouth 3 (three) times daily.     ipratropium-albuterol (DUONEB) 0.5-2.5 (3) MG/3ML SOLN Take 3 mLs by nebulization every 6 (six) hours as needed (shortness of breath).     isosorbide mononitrate (IMDUR) 30 MG 24 hr tablet Take 1 tablet (30 mg total) by mouth daily. 90 tablet 3   metFORMIN (GLUCOPHAGE) 500 MG tablet Take 500 mg by mouth daily with breakfast.     metoprolol tartrate (LOPRESSOR) 25 MG tablet Take 1 tablet (25 mg  total) by mouth 2 (two) times daily. 180 tablet 3   nitroGLYCERIN (NITROSTAT) 0.4 MG SL tablet Place 1 tablet (0.4 mg total) under the tongue every 5 (five) minutes as needed for chest pain. 25 tablet 3   omeprazole (PRILOSEC) 20 MG capsule Take 20 mg by mouth daily.     oxyCODONE-acetaminophen (PERCOCET) 10-325 MG per tablet Take 1 tablet by mouth every 8 (eight) hours as needed for pain.     polyethylene glycol (MIRALAX / GLYCOLAX) 17 g packet Take 17 g by mouth 2 (two) times daily. 14 each 0   senna-docusate (SENOKOT-S) 8.6-50 MG tablet Take 1 tablet by mouth at bedtime. 30 tablet 0   tiZANidine (ZANAFLEX) 4 MG tablet Take 4 mg by mouth 3 (three) times daily.     valsartan (DIOVAN) 80 MG tablet Take 1 tablet (80 mg total) by mouth daily. 30 tablet 11   No current facility-administered medications for this visit.     Past Surgical History:  Procedure Laterality Date   CARDIAC SURGERY     CYSTOSCOPY WITH RETROGRADE PYELOGRAM, URETEROSCOPY AND STENT PLACEMENT Left 03/14/2015   Procedure: CYSTOSCOPY WITH LEFT RETROGRADE PYELOGRAM;  Surgeon: Alfredo Martinez, MD;  Location: [facility];  Service: Urology;  Laterality: Left;   CYSTOSCOPY WITH RETROGRADE PYELOGRAM, URETEROSCOPY AND STENT PLACEMENT Left 03/20/2015   Procedure: CYSTOSCOPY WITH LEFT  RETROGRADE LEFT URETEROSCOPY AND LEFT  STENT ;  Surgeon: Bjorn Pippin, MD;  Location: [facility];  Service: Urology;  Laterality: Left;   HOLMIUM LASER APPLICATION Left 03/20/2015   Procedure: HOLMIUM LASER APPLICATION;  Surgeon: Bjorn Pippin, MD;  Location: [facility];  Service: Urology;  Laterality: Left;   LEFT HEART CATHETERIZATION WITH CORONARY ANGIOGRAM N/A 10/25/2014   Procedure: LEFT HEART CATHETERIZATION WITH CORONARY ANGIOGRAM;  Surgeon: Kathleene Hazel, MD;  Location: [facility] CATH LAB;  Service: Cardiovascular;  Laterality: N/A;   LITHOTRIPSY  about 2011   SPINAL FUSION     L5-S1   TUBAL LIGATION       Allergies  Allergen Reactions   Lyrica [Pregabalin]  Shortness Of Breath    Chest Pain    Bactrim [Sulfamethoxazole-Trimethoprim] Rash   Prednisone Palpitations and Hypertension           Family History  Problem Relation Age of Onset   Diabetes Mother    Stroke Mother    Seizures Other    Cancer Other  Diabetes Other      Social History Ms. Mcglocklin reports that she has been smoking cigarettes. She started smoking about 32 years ago. She has a 12.00 pack-year smoking history. She has never used smokeless tobacco. Ms. Tramble reports no history of alcohol use.   Review of Systems CONSTITUTIONAL: No weight loss, fever, chills, weakness or fatigue.  HEENT: Eyes: No visual loss, blurred vision, double vision or yellow sclerae.No hearing loss, sneezing, congestion, runny nose or sore throat.  SKIN: No rash or itching.  CARDIOVASCULAR: per hpi RESPIRATORY: per hpi GASTROINTESTINAL: No anorexia, nausea, vomiting or diarrhea. No abdominal pain or blood.  GENITOURINARY: No burning on urination, no polyuria NEUROLOGICAL: No headache, dizziness, syncope, paralysis, ataxia, numbness or tingling in the extremities. No change in bowel or bladder control.  MUSCULOSKELETAL: No muscle, back pain, joint pain or stiffness.  LYMPHATICS: No enlarged nodes. No history of splenectomy.  PSYCHIATRIC: No history of depression or anxiety.  ENDOCRINOLOGIC: No reports of sweating, cold or heat intolerance. No polyuria or polydipsia.  Marland Kitchen   Physical Examination Today's Vitals   02/03/23 0843  BP: 114/68  Pulse: 97  SpO2: 94%  Weight: 175 lb (79.4 kg)  Height: 5' 2.5" (1.588 m)   Body mass index is 31.5 kg/m.  Gen: resting comfortably, no acute distress HEENT: no scleral icterus, pupils equal round and reactive, no palptable cervical adenopathy,  CV: RRR, no m/rg, no jvd Resp: Clear to auscultation bilaterally GI: abdomen is soft, non-tender, non-distended, normal bowel sounds, no hepatosplenomegaly MSK: extremities are warm, no edema.  Skin:  warm, no rash Neuro:  no focal deficits Psych: appropriate affect   Diagnostic Studies Jan 2016 Cath PCI Note: She was given an additional 5000 units IV heparin x 1. ACT was over 200. She was given Brilinta 180 mg po x 1.   Lesion # 1 Distal RCA: JR4 guiding catheter used to engage the RCA. Cougar IC wire down the RCA. 2.0 x 15 mm balloon x 1 for pre-dilatation. 2.25 x 16 mm Promus Premier DES x 1 distal RCA post-dilated with a 2.5 x 12 mm [place] balloon x 1. Stenosis taken from 90% down to 0%.   Lesion # 2 mid RCA: 2.0 x 15 mm balloon x 1. 3.0 x 12 mm Promus Premier DES x 1. The mid stenosis was followed by an aneurysmal segment with a size mismatch before the distal vessel so stents were not overlapped. Post-dilatation with a 3.5 x 8 mm [place] balloon x 1. Stenosis taken from 95% down to 0%.   Lesion #3 mid LAD: XB LAD 3.5 guiding catheter to engage left main. 2.5 x 12 mm balloon x 1 for pre-dilatation. 3.5 x 16 mm Promus Premier DES x 1 mid LAD. Stent post-dilated with a 3.5 x 8 mm  balloon x 2. Stenosis taken from 80% down to 0%.   The sheath was removed from the right radial artery and a Terumo hemostasis band was applied at the arteriotomy site on the right wrist. There were no immediate complications. The patient was taken to the recovery area in stable condition.   Hemodynamic Findings: Central aortic pressure: 115/66 Left ventricular pressure: 121/3/14   Angiographic Findings:   Left main: No obstructive disease.     Left Anterior Descending Artery: Large caliber vessel that courses to the apex. 80% mid stenosis. Small caliber diagonal Johanna Stafford. The distal LAD tapers to a small caliber vessel.     Circumflex Artery: Large caliber vessel with 20% proximal stenosis.  Right Coronary Artery: Large dominant vessel with diffuse 30% proximal stenosis, 95% mid stenosis followed by an aneurysmal segment. Distal 90% stenosis. Diffuse mild plaque in the posterolateral artery and PDA.   Left  Ventricular Angiogram: LVEF=55-60%.   Impression: 1. Severe double vessel CAD 2. NSTEMI 3. Normal LV systolic function 4. Successful PTCA/DES x 1 mid RCA and PTCA/DES x 1 distal RCA (stents not overlapped due to size mismatch in the vessel and the presence of an aneurysmal segment in the distal segment of the mid vessel) 5. Successful PTCA/DES x 1 mid LAD   Recommendations: Will need dual anti-platelet therapy with ASA and Brilinta for at least one year. Continue beta blocker and statin. Tobacco cessation.          04/2015 echo Study Conclusions  - Left ventricle: The cavity size was normal. Wall thickness was   increased in a pattern of mild LVH. Systolic function was normal.   The estimated ejection fraction was in the range of 55% to 60%.   Wall motion was normal; there were no regional wall motion   abnormalities. Left ventricular diastolic function parameters   were normal. - Aortic valve: Mildly calcified annulus. Trileaflet; mildly   thickened leaflets. Valve area (VTI): 2.07 cm^2. Valve area   (Vmax): 2.2 cm^2. - Atrial septum: No defect or patent foramen ovale was identified. - Technically adequate study.   11/2015 DSE ------------------------------------------------------------------- Stress echo results:     Left ventricular ejection fraction was normal at rest and with stress. Normal echo stress   05/2016 Nuclear stress [place] Severe gut uptake, limited study. No clear ischemia       11/2018 nuclear stress   There was no ST segment deviation noted during stress. The study is normal. There are no perfusion defects consistent with prior infarct or current ischemia This is a low risk study. The left ventricular ejection fraction is normal (55-65%).   Jan 2022 echo   IMPRESSIONS     1. Left ventricular ejection fraction, by estimation, is 65 to 70%. The  left ventricle has normal function. The left ventricle has no regional  wall motion abnormalities. There is  mild left ventricular hypertrophy.  Left ventricular diastolic parameters  were normal.   2. Right ventricular systolic function is normal. The right ventricular  size is normal. Tricuspid regurgitation signal is inadequate for assessing  PA pressure.   3. The mitral valve is grossly normal. Trivial mitral valve  regurgitation.   4. The aortic valve is tricuspid. Aortic valve regurgitation is trivial.   5. The inferior vena cava is normal in size with greater than 50%  respiratory variability, suggesting right atrial pressure of 3 mmHg.      09/2022 PFTs severe diffuse defect  Assessment and Plan   1.CAD No recent symptoms, continue current meds     2. HTN -bp at goal, continue current meds     3. Hyperlipidemia - request labs from pcp, continue atorvastatin  4. Chronic HFpEF - she is euvolemic today without symptoms, continue lasix at current dose  F/u 6 months      Antoine Poche, M.D.

## 2023-02-27 ENCOUNTER — Ambulatory Visit: Payer: Medicare Other | Admitting: Cardiology

## 2023-04-08 ENCOUNTER — Ambulatory Visit (INDEPENDENT_AMBULATORY_CARE_PROVIDER_SITE_OTHER): Payer: Medicare Other | Admitting: Internal Medicine

## 2023-04-08 ENCOUNTER — Encounter: Payer: Self-pay | Admitting: Internal Medicine

## 2023-04-08 VITALS — BP 92/61 | HR 78 | Ht 62.0 in | Wt 176.8 lb

## 2023-04-08 DIAGNOSIS — J9611 Chronic respiratory failure with hypoxia: Secondary | ICD-10-CM

## 2023-04-08 DIAGNOSIS — J449 Chronic obstructive pulmonary disease, unspecified: Secondary | ICD-10-CM | POA: Diagnosis not present

## 2023-04-08 DIAGNOSIS — J9612 Chronic respiratory failure with hypercapnia: Secondary | ICD-10-CM

## 2023-04-08 DIAGNOSIS — F17210 Nicotine dependence, cigarettes, uncomplicated: Secondary | ICD-10-CM | POA: Diagnosis not present

## 2023-04-08 MED ORDER — AZITHROMYCIN 250 MG PO TABS: ORAL_TABLET | ORAL | 0 refills | Status: DC

## 2023-04-08 MED ORDER — BUDESONIDE-FORMOTEROL FUMARATE 80-4.5 MCG/ACT IN AERO: INHALATION_SPRAY | RESPIRATORY_TRACT | 11 refills | Status: DC

## 2023-04-08 NOTE — Patient Instructions (Signed)
Zpak  For cough / congestion : mucinex dm 1200 mg every 12 hours as needed (over the counter)   Try change breztri to symbicort 80  Take 2 puffs first thing in am and then another 2 puffs about 12 hours later.     Work on inhaler technique:  relax and gently blow all the way out then take a nice smooth full deep breath back in, triggering the inhaler at same time you start breathing in.  Hold breath in for at least  5 seconds if you can. Blow out symbicort out  thru nose. Rinse and gargle with water when done.  If mouth or throat bother you at all,  try brushing teeth/gums/tongue with arm and hammer toothpaste/ make a slurry and gargle and spit out.  >>>  Remember how golfers warm up by taking practice swings - do this with an empty inhaler    Please schedule a follow up visit in 6 months but call sooner if needed

## 2023-04-08 NOTE — Progress Notes (Signed)
Tanya Lowe, female    DOB: [birth year]    MRN: [id number]   Brief patient profile:  46 yowf active smoker with allergic rhinitis worse in spring//asthma periodically more need albuterol x 2013  referred to pulmonary clinic in [place]  08/12/2022 by Orpah Cobb DO  for copd eval  and proved to have COPD 0/ AB by pfts 09/29/22       History of Present Illness  08/12/2022  Pulmonary/ 1st office eval/ Tanya Lowe / [facility]  no maint rx Chief Complaint  Patient presents with   Consult    Consult for copd abnormal CT scan. Sent home from hospital on 2LO2 cont    Dyspnea:  with 02 2lpm doing better and able to do [employer]/ uses [facility] parking  Cough: periodically yellowish brown  Sleep: on cpap s 02 SABA use: very confused with instructions on how/when to use which one but neb helps the most 02 :  2lpm daytime but not adjusting  Rec My office will be contacting you by phone for referral for PFTs    Plan A = Automatic = Always=    Breztri Take 2 puffs first thing in am and then another 2 puffs about 12 hours later.   Work on inhaler technique:   Plan B = Backup (to supplement plan A, not to replace it) Only use your albuterol inhaler as a rescue medication Plan C = Crisis (instead of Plan B but only if Plan B stops working) - only use your albuterol nebulizer if you first try Plan B  Make sure you check your oxygen saturation  AT  your highest level of activity (not after you stop)   to be sure it stays over 90%  Plug 02 into your cpap @ 2lpm - call 02 supplier to get the right connection  The key is to stop smoking completely before smoking completely stops you!   09/29/2022  f/u ov/Tanya Lowe re: doe  maint on Breztri  /02  s airflow obst on PFTs 09/29/2022 p breztri prior Chief Complaint  Patient presents with   Follow-up    Review PFT today.  Doing well today   Dyspnea:  able to do  [employer] on 2lpm  Cough: none x  after am breztri Sleeping: cpap no 02  SABA use: none   02: 2lpm when walking but not checking sats  Covid status:   vax none/ infected x one  Rec Stop lisinopril and start valsartan 80 mg use one half daily  - ok to take whole pill if needed to keep systolic less than 120  Make sure you check your oxygen saturation  AT  your highest level of activity (not after you stop)   to be sure it stays over 90%      04/08/2023  f/u ov/ office/Tanya Lowe re:AB maint on breztri  Chief Complaint  Patient presents with   Follow-up    Pt f/u states that she is feeling bad today and feels like she may have a URI.    Dyspnea:  only sltly worse since uri onset  Cough: worse x one month with nasal congestion slt yellow  Sleeping: bed blocks/ cpap fine  SABA use: none even with flare  02: 2lpm prn but not noct and usually not needed  - LDSCT q April last done 2024     No obvious day to day or daytime variability or assoc excess/ purulent sputum or mucus plugs or hemoptysis or cp or chest  tightness, subjective wheeze or overt sinus or hb symptoms.   Sleeping  without nocturnal  or early am exacerbation  of respiratory  c/o's or need for noct saba. Also denies any obvious fluctuation of symptoms with weather or environmental changes or other aggravating or alleviating factors except as outlined above   No unusual exposure hx or h/o childhood pna/ asthma or knowledge of premature birth.  Current Allergies, Complete Past Medical History, Past Surgical History, Family History, and Social History were reviewed in [employer] record.  ROS  The following are not active complaints unless bolded Hoarseness, sore throat, dysphagia, dental problems, itching, sneezing,  nasal congestion or discharge of excess mucus or purulent secretions, ear ache,   fever, chills, sweats, unintended wt loss or wt gain, classically pleuritic or exertional cp,  orthopnea pnd or arm/hand swelling  or leg swelling, presyncope, palpitations, abdominal pain, anorexia,  nausea, vomiting, diarrhea  or change in bowel habits or change in bladder habits, change in stools or change in urine, dysuria, hematuria,  rash, arthralgias, visual complaints, headache, numbness, weakness or ataxia or problems with walking or coordination,  change in mood or  memory.        Current Meds  Medication Sig   acetaminophen (TYLENOL) 500 MG tablet Take 1,000 mg by mouth every 6 (six) hours as needed for mild pain.    albuterol (PROVENTIL HFA;VENTOLIN HFA) 108 (90 Base) MCG/ACT inhaler Inhale 1-2 puffs into the lungs every 6 (six) hours as needed for wheezing or shortness of breath.   aspirin EC 81 MG tablet Take 81 mg by mouth daily.   atorvastatin (LIPITOR) 80 MG tablet Take 1 tablet (80 mg total) by mouth daily at 6 PM.   Budeson-Glycopyrrol-Formoterol (BREZTRI AEROSPHERE) 160-9-4.8 MCG/ACT AERO Inhale 2 puffs into the lungs 2 (two) times daily. Take 2 puffs first thing in am and then another 2 puffs about 12 hours later.   busPIRone (BUSPAR) 7.5 MG tablet Take 15 mg by mouth 3 (three) times daily.   DULoxetine (CYMBALTA) 30 MG capsule Take 30 mg by mouth daily. Take with 60 mg to equal 90 mg   DULoxetine (CYMBALTA) 60 MG capsule Take 60 mg by mouth daily. Take with 30 mg to equal 90 mg   fexofenadine (ALLEGRA) 180 MG tablet Take 180 mg by mouth daily.   fluticasone (FLONASE) 50 MCG/ACT nasal spray Place 2 sprays into both nostrils daily as needed for allergies.    gabapentin (NEURONTIN) 600 MG tablet Take 1,200 mg by mouth 3 (three) times daily.   ipratropium-albuterol (DUONEB) 0.5-2.5 (3) MG/3ML SOLN Take 3 mLs by nebulization every 6 (six) hours as needed (shortness of breath).   irbesartan (AVAPRO) 75 MG tablet Take 37.5 mg by mouth daily.   isosorbide mononitrate (IMDUR) 30 MG 24 hr tablet Take 1 tablet (30 mg total) by mouth daily.   metFORMIN (GLUCOPHAGE) 500 MG tablet Take 500 mg by mouth daily with breakfast.   metoprolol tartrate (LOPRESSOR) 25 MG tablet Take 1 tablet  (25 mg total) by mouth 2 (two) times daily.   nitroGLYCERIN (NITROSTAT) 0.4 MG SL tablet Place 1 tablet (0.4 mg total) under the tongue every 5 (five) minutes as needed for chest pain.   omeprazole (PRILOSEC) 20 MG capsule Take 20 mg by mouth daily.   oxyCODONE-acetaminophen (PERCOCET) 10-325 MG per tablet Take 1 tablet by mouth every 8 (eight) hours as needed for pain.   polyethylene glycol (MIRALAX / GLYCOLAX) 17 g packet Take 17 g  by mouth 2 (two) times daily.   senna-docusate (SENOKOT-S) 8.6-50 MG tablet Take 1 tablet by mouth at bedtime.   tiZANidine (ZANAFLEX) 4 MG tablet Take 4 mg by mouth 3 (three) times daily.          Past Medical History:  Diagnosis Date   Arthritis    Bilateral hand pain    CAD (coronary artery disease)    a. s/p NSTEMI in 2016 with DES to mid-RCA, DES to [facility] and DES to mid-LAD b. low-risk NST in 05/2016 and 11/2018   Cervical radiculopathy    Chronic neck and back pain    Emphysema lung (HCC)    Fibromyalgia    Hypertension    Kidney stones    hx of w previous lithotripsy   MI (myocardial infarction) (HCC)    Pain management    [facility]   Thoracic back pain        Objective:    Wts  04/08/2023       176    09/29/22 170 lb 9.6 oz (77.4 kg)  08/12/22 169 lb 9.6 oz (76.9 kg)  08/07/22 168 lb 6.4 oz (76.4 kg)    Vital signs reviewed  04/08/2023  - Note at rest 02 sats  96% on 2lpm   General appearance:    hoarse amb wf nad  / occ throat clearing    HEENT : Oropharynx  clear      Nasal turbinates nl    NECK :  without  apparent JVD/ palpable Nodes/TM    LUNGS: no acc muscle use,  Nl contour chest which is clear to A and P bilaterally without cough on insp or exp maneuvers   CV:  RRR  no s3 or murmur or increase in P2, and no edema   ABD:  soft and nontender   MS:  Nl gait/ ext warm without deformities Or obvious joint restrictions  calf tenderness, cyanosis or clubbing    SKIN: warm and dry without lesions    NEURO:  alert, approp,  nl sensorium with  no motor or cerebellar deficits apparent.            I personally reviewed images and agree with radiology impression as follows:   Chest CT PET  07/31/22 1. Enlarged mediastinal and hilar lymph nodes demonstrate low level hypermetabolism.   No significant change when compared to the March 2023 CT scan. CT surveillance is suggested. No enlarged neck,supraclavicular, abdominal/pelvic or inguinal nodes. Follow-up CT scan in March 2024 which would be a 1 year follow-up from the initial chest CT. 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 The lower lung zones demonstrate a moderate mosaic pattern of ground-glassattenuation which can be seen with small airways disease such as asthma or bronchiolitis. Hypersensitivity pneumonitis andcryptogenic organizing pneumonia are other possibilities    Assessment

## 2023-04-08 NOTE — Assessment & Plan Note (Addendum)
Active smoker with PET CT c/w emphysema and AB 07/31/22  - 08/12/2022  After extensive coaching inhaler device,  effectiveness =    75% so try breztri 2bid and approp saba - PFT's  09/29/22   FEV1 2.29 (89 % ) ratio 0.84  p 0 % improvement from saba p Breztri  prior to study with DLCO  7.25 (37%)   and FV curve nl  - 04/08/2023  After extensive coaching inhaler device,  effectiveness =   75% > try symbicort 80   Despite what would normally be a trigger for AB her lungs are clear but she's quite hoarse likely partly from high dose ICS so since she doesn't really have much copd rx as AB with symbicort 80 2bid and if does well can change to symbicort 80 prn  Based on two studies from NEJM  378; 20 p 1865 (2018) and 380 : p2020-30 (2019) in pts with mild asthma it is reasonable to use low dose symbicort eg 80 2bid "prn" flare in this setting but I emphasized this was only shown with symbicort and takes advantage of the rapid onset of action but is not the same as "rescue therapy" but can be stopped once the acute symptoms have resolved and the need for rescue has been minimized (< 2 x weekly)    Rx uri with Zpak/ hold off on pred as not wheezing at all

## 2023-04-08 NOTE — Assessment & Plan Note (Signed)
HC03  08/12/2022   =  32  - 08/12/2022 patient walked at a moderate pace on 2LO2 cont x 450 ft ft with . SOB starting after 150 ft but did not stop with  lowest sats 94%   Again advised: Make sure you check your oxygen saturation  AT  your highest level of activity (not after you stop)   to be sure it stays over 90% and adjust  02 flow upward to maintain this level if needed but remember to turn it back to previous settings when you stop (to conserve your supply).   Will refer for eval for POC

## 2023-04-08 NOTE — Assessment & Plan Note (Signed)
Counseled re importance of smoking cessation but did not meet time criteria for separate billing            Each maintenance medication was reviewed in detail including emphasizing most importantly the difference between maintenance and prns and under what circumstances the prns are to be triggered using an action plan format where appropriate.  Total time for H and P, chart review, counseling, reviewing hfa device(s) and generating customized AVS unique to this office visit / same day charting  > 30 min       

## 2023-04-09 NOTE — Addendum Note (Signed)
Addended by: Jaynee Eagles on: 04/09/2023 08:28 AM   Modules accepted: Orders

## 2023-05-01 ENCOUNTER — Telehealth: Payer: Self-pay | Admitting: Internal Medicine

## 2023-05-01 MED ORDER — BUDESONIDE-FORMOTEROL FUMARATE 80-4.5 MCG/ACT IN AERO: INHALATION_SPRAY | RESPIRATORY_TRACT | 11 refills | Status: DC

## 2023-05-01 NOTE — Telephone Encounter (Signed)
Patient states [facility] spoke with [facility] and they do not have Symbicort script. Patient wants script sent to [facility] - [place], [place]  Please advise and call back at [phone].

## 2023-05-01 NOTE — Telephone Encounter (Signed)
I called pt and there was no answer- left her a detailed msg ok per DPR letting her know that the rx was sent to preferred pharm.

## 2023-06-15 ENCOUNTER — Encounter: Payer: Self-pay | Admitting: *Deleted

## 2023-06-16 ENCOUNTER — Telehealth (INDEPENDENT_AMBULATORY_CARE_PROVIDER_SITE_OTHER): Payer: Medicare Other | Admitting: Adult Health

## 2023-06-16 DIAGNOSIS — G4733 Obstructive sleep apnea (adult) (pediatric): Secondary | ICD-10-CM | POA: Diagnosis not present

## 2023-06-17 NOTE — Progress Notes (Signed)
Mask refit order faxed to [facility]. Received a receipt of confirmation.

## 2023-07-20 ENCOUNTER — Other Ambulatory Visit: Payer: Self-pay | Admitting: Student

## 2023-09-10 ENCOUNTER — Emergency Department (HOSPITAL_COMMUNITY): Payer: Medicare Other

## 2023-09-10 ENCOUNTER — Other Ambulatory Visit: Payer: Self-pay

## 2023-09-10 ENCOUNTER — Telehealth: Payer: Self-pay | Admitting: Internal Medicine

## 2023-09-10 ENCOUNTER — Encounter (HOSPITAL_COMMUNITY): Payer: Self-pay

## 2023-09-10 ENCOUNTER — Inpatient Hospital Stay (HOSPITAL_COMMUNITY)
Admission: EM | Admit: 2023-09-10 | Discharge: 2023-09-13 | DRG: 291 | Disposition: A | Discharge status: Home / Self Care | Payer: Medicare Other | Source: Ambulatory Visit | Attending: Family Medicine | Admitting: Family Medicine

## 2023-09-10 DIAGNOSIS — Z882 Allergy status to sulfonamides status: Secondary | ICD-10-CM

## 2023-09-10 DIAGNOSIS — R590 Localized enlarged lymph nodes: Secondary | ICD-10-CM | POA: Diagnosis present

## 2023-09-10 DIAGNOSIS — Z79899 Other long term (current) drug therapy: Secondary | ICD-10-CM | POA: Diagnosis not present

## 2023-09-10 DIAGNOSIS — I25118 Atherosclerotic heart disease of native coronary artery with other forms of angina pectoris: Secondary | ICD-10-CM | POA: Diagnosis not present

## 2023-09-10 DIAGNOSIS — M797 Fibromyalgia: Secondary | ICD-10-CM | POA: Diagnosis present

## 2023-09-10 DIAGNOSIS — I2510 Atherosclerotic heart disease of native coronary artery without angina pectoris: Secondary | ICD-10-CM | POA: Diagnosis present

## 2023-09-10 DIAGNOSIS — Z6833 Body mass index (BMI) 33.0-33.9, adult: Secondary | ICD-10-CM

## 2023-09-10 DIAGNOSIS — J849 Interstitial pulmonary disease, unspecified: Secondary | ICD-10-CM

## 2023-09-10 DIAGNOSIS — E119 Type 2 diabetes mellitus without complications: Secondary | ICD-10-CM

## 2023-09-10 DIAGNOSIS — I272 Pulmonary hypertension, unspecified: Secondary | ICD-10-CM

## 2023-09-10 DIAGNOSIS — G8929 Other chronic pain: Secondary | ICD-10-CM | POA: Diagnosis present

## 2023-09-10 DIAGNOSIS — G4733 Obstructive sleep apnea (adult) (pediatric): Secondary | ICD-10-CM

## 2023-09-10 DIAGNOSIS — Z888 Allergy status to other drugs, medicaments and biological substances status: Secondary | ICD-10-CM

## 2023-09-10 DIAGNOSIS — E669 Obesity, unspecified: Secondary | ICD-10-CM | POA: Diagnosis present

## 2023-09-10 DIAGNOSIS — J189 Pneumonia, unspecified organism: Secondary | ICD-10-CM | POA: Diagnosis present

## 2023-09-10 DIAGNOSIS — Z9981 Dependence on supplemental oxygen: Secondary | ICD-10-CM | POA: Diagnosis not present

## 2023-09-10 DIAGNOSIS — Z7951 Long term (current) use of inhaled steroids: Secondary | ICD-10-CM

## 2023-09-10 DIAGNOSIS — J9621 Acute and chronic respiratory failure with hypoxia: Secondary | ICD-10-CM

## 2023-09-10 DIAGNOSIS — Z87442 Personal history of urinary calculi: Secondary | ICD-10-CM

## 2023-09-10 DIAGNOSIS — I110 Hypertensive heart disease with heart failure: Principal | ICD-10-CM | POA: Diagnosis present

## 2023-09-10 DIAGNOSIS — I10 Essential (primary) hypertension: Secondary | ICD-10-CM | POA: Diagnosis present

## 2023-09-10 DIAGNOSIS — Z955 Presence of coronary angioplasty implant and graft: Secondary | ICD-10-CM

## 2023-09-10 DIAGNOSIS — K219 Gastro-esophageal reflux disease without esophagitis: Secondary | ICD-10-CM | POA: Diagnosis present

## 2023-09-10 DIAGNOSIS — Z1152 Encounter for screening for COVID-19: Secondary | ICD-10-CM | POA: Diagnosis not present

## 2023-09-10 DIAGNOSIS — I2489 Other forms of acute ischemic heart disease: Secondary | ICD-10-CM | POA: Diagnosis present

## 2023-09-10 DIAGNOSIS — R350 Frequency of micturition: Secondary | ICD-10-CM | POA: Diagnosis not present

## 2023-09-10 DIAGNOSIS — J439 Emphysema, unspecified: Secondary | ICD-10-CM | POA: Diagnosis present

## 2023-09-10 DIAGNOSIS — F17210 Nicotine dependence, cigarettes, uncomplicated: Secondary | ICD-10-CM | POA: Diagnosis present

## 2023-09-10 DIAGNOSIS — E785 Hyperlipidemia, unspecified: Secondary | ICD-10-CM | POA: Diagnosis present

## 2023-09-10 DIAGNOSIS — E871 Hypo-osmolality and hyponatremia: Secondary | ICD-10-CM | POA: Diagnosis present

## 2023-09-10 DIAGNOSIS — Z833 Family history of diabetes mellitus: Secondary | ICD-10-CM

## 2023-09-10 DIAGNOSIS — R0902 Hypoxemia: Secondary | ICD-10-CM

## 2023-09-10 DIAGNOSIS — M549 Dorsalgia, unspecified: Secondary | ICD-10-CM | POA: Diagnosis present

## 2023-09-10 DIAGNOSIS — Z7984 Long term (current) use of oral hypoglycemic drugs: Secondary | ICD-10-CM

## 2023-09-10 DIAGNOSIS — Z7982 Long term (current) use of aspirin: Secondary | ICD-10-CM

## 2023-09-10 DIAGNOSIS — M1990 Unspecified osteoarthritis, unspecified site: Secondary | ICD-10-CM | POA: Diagnosis present

## 2023-09-10 DIAGNOSIS — I5020 Unspecified systolic (congestive) heart failure: Principal | ICD-10-CM

## 2023-09-10 DIAGNOSIS — Z720 Tobacco use: Secondary | ICD-10-CM | POA: Diagnosis present

## 2023-09-10 DIAGNOSIS — Z981 Arthrodesis status: Secondary | ICD-10-CM

## 2023-09-10 DIAGNOSIS — I5033 Acute on chronic diastolic (congestive) heart failure: Secondary | ICD-10-CM | POA: Diagnosis present

## 2023-09-10 DIAGNOSIS — J449 Chronic obstructive pulmonary disease, unspecified: Secondary | ICD-10-CM | POA: Diagnosis present

## 2023-09-10 DIAGNOSIS — I252 Old myocardial infarction: Secondary | ICD-10-CM | POA: Diagnosis not present

## 2023-09-10 DIAGNOSIS — I5031 Acute diastolic (congestive) heart failure: Secondary | ICD-10-CM | POA: Diagnosis not present

## 2023-09-10 DIAGNOSIS — R0602 Shortness of breath: Secondary | ICD-10-CM | POA: Diagnosis present

## 2023-09-10 LAB — HEMOGLOBIN A1C
Hgb A1c MFr Bld: 6.3 % — ABNORMAL HIGH (ref 4.8–5.6)
Mean Plasma Glucose: 134.11 mg/dL

## 2023-09-10 LAB — TROPONIN I (HIGH SENSITIVITY)
Troponin I (High Sensitivity): 46 ng/L — ABNORMAL HIGH (ref <18)
Troponin I (High Sensitivity): 41 ng/L — ABNORMAL HIGH (ref <18)

## 2023-09-10 LAB — COMPREHENSIVE METABOLIC PANEL
ALT: 31 U/L (ref 0–44)
AST: 23 U/L (ref 15–41)
Albumin: 3 g/dL — ABNORMAL LOW (ref 3.5–5.0)
Alkaline Phosphatase: 93 U/L (ref 38–126)
Anion gap: 11 (ref 5–15)
BUN: 13 mg/dL (ref 6–20)
CO2: 23 mmol/L (ref 22–32)
Calcium: 8.5 mg/dL — ABNORMAL LOW (ref 8.9–10.3)
Chloride: 100 mmol/L (ref 98–111)
Creatinine, Ser: 0.86 mg/dL (ref 0.44–1.00)
GFR, Estimated: >60 mL/min (ref >60)
Glucose, Bld: 117 mg/dL — ABNORMAL HIGH (ref 70–99)
Potassium: 4.2 mmol/L (ref 3.5–5.1)
Sodium: 134 mmol/L — ABNORMAL LOW (ref 135–145)
Total Bilirubin: 0.8 mg/dL (ref <1.2)
Total Protein: 6.6 g/dL (ref 6.5–8.1)

## 2023-09-10 LAB — CBC WITH DIFFERENTIAL/PLATELET
Abs Immature Granulocytes: 0.03 10*3/uL (ref 0.00–0.07)
Basophils Absolute: 0 10*3/uL (ref 0.0–0.1)
Basophils Relative: 0 %
Eosinophils Absolute: 0 10*3/uL (ref 0.0–0.5)
Eosinophils Relative: 0 %
HCT: 36.5 % (ref 36.0–46.0)
Hemoglobin: 11.4 g/dL — ABNORMAL LOW (ref 12.0–15.0)
Immature Granulocytes: 0 %
Lymphocytes Relative: 18 %
Lymphs Abs: 1.7 10*3/uL (ref 0.7–4.0)
MCH: 28.1 pg (ref 26.0–34.0)
MCHC: 31.2 g/dL (ref 30.0–36.0)
MCV: 90.1 fL (ref 80.0–100.0)
Monocytes Absolute: 0.3 10*3/uL (ref 0.1–1.0)
Monocytes Relative: 4 %
Neutro Abs: 7.3 10*3/uL (ref 1.7–7.7)
Neutrophils Relative %: 78 %
Platelets: 267 10*3/uL (ref 150–400)
RBC: 4.05 MIL/uL (ref 3.87–5.11)
RDW: 17.2 % — ABNORMAL HIGH (ref 11.5–15.5)
WBC: 9.4 10*3/uL (ref 4.0–10.5)
nRBC: 0 % (ref 0.0–0.2)

## 2023-09-10 LAB — BRAIN NATRIURETIC PEPTIDE: B Natriuretic Peptide: 1340 pg/mL — ABNORMAL HIGH (ref 0.0–100.0)

## 2023-09-10 LAB — RESP PANEL BY RT-PCR (RSV, FLU A&B, COVID)  RVPGX2
Influenza A by PCR: NEGATIVE
Influenza B by PCR: NEGATIVE
Resp Syncytial Virus by PCR: NEGATIVE
SARS Coronavirus 2 by RT PCR: NEGATIVE

## 2023-09-10 LAB — GLUCOSE, CAPILLARY: Glucose-Capillary: 205 mg/dL — ABNORMAL HIGH (ref 70–99)

## 2023-09-10 LAB — D-DIMER, QUANTITATIVE: D-Dimer, Quant: 1.09 ug{FEU}/mL — ABNORMAL HIGH (ref 0.00–0.50)

## 2023-09-10 MED ORDER — ISOSORBIDE MONONITRATE ER 60 MG PO TB24
30.0000 mg | ORAL_TABLET | Freq: Every day | ORAL | Status: DC
  Administered 2023-09-11 – 2023-09-13 (×3): 30 mg via ORAL
  Filled 2023-09-10 (×3): qty 1

## 2023-09-10 MED ORDER — METOPROLOL TARTRATE 25 MG PO TABS
25.0000 mg | ORAL_TABLET | Freq: Every day | ORAL | Status: DC
  Administered 2023-09-11 – 2023-09-13 (×3): 25 mg via ORAL
  Filled 2023-09-10 (×3): qty 1

## 2023-09-10 MED ORDER — DULOXETINE HCL 60 MG PO CPEP: 60.0000 mg | ORAL_CAPSULE | Freq: Every day | ORAL | Status: DC

## 2023-09-10 MED ORDER — ENOXAPARIN SODIUM 40 MG/0.4ML IJ SOSY
40.0000 mg | PREFILLED_SYRINGE | INTRAMUSCULAR | Status: DC
  Administered 2023-09-11 – 2023-09-12 (×2): 40 mg via SUBCUTANEOUS
  Filled 2023-09-10 (×2): qty 0.4

## 2023-09-10 MED ORDER — MOMETASONE FURO-FORMOTEROL FUM 100-5 MCG/ACT IN AERO
2.0000 | INHALATION_SPRAY | Freq: Two times a day (BID) | RESPIRATORY_TRACT | Status: DC
  Administered 2023-09-10 – 2023-09-13 (×6): 2 via RESPIRATORY_TRACT
  Filled 2023-09-10: qty 8.8

## 2023-09-10 MED ORDER — ASPIRIN 81 MG PO TBEC
81.0000 mg | DELAYED_RELEASE_TABLET | Freq: Every day | ORAL | Status: DC
  Administered 2023-09-11 – 2023-09-13 (×3): 81 mg via ORAL
  Filled 2023-09-10 (×3): qty 1

## 2023-09-10 MED ORDER — NICOTINE 21 MG/24HR TD PT24
21.0000 mg | MEDICATED_PATCH | Freq: Every day | TRANSDERMAL | Status: DC
  Filled 2023-09-10: qty 1

## 2023-09-10 MED ORDER — DULOXETINE HCL 60 MG PO CPEP
90.0000 mg | ORAL_CAPSULE | Freq: Every day | ORAL | Status: DC
  Administered 2023-09-11 – 2023-09-13 (×3): 90 mg via ORAL
  Filled 2023-09-10 (×3): qty 1

## 2023-09-10 MED ORDER — NITROGLYCERIN 0.4 MG SL SUBL: 0.4000 mg | SUBLINGUAL_TABLET | SUBLINGUAL | Status: DC | PRN

## 2023-09-10 MED ORDER — TIZANIDINE HCL 4 MG PO TABS
4.0000 mg | ORAL_TABLET | Freq: Three times a day (TID) | ORAL | Status: DC
  Administered 2023-09-10 – 2023-09-13 (×8): 4 mg via ORAL
  Filled 2023-09-10 (×8): qty 1

## 2023-09-10 MED ORDER — ACETAMINOPHEN 325 MG PO TABS
325.0000 mg | ORAL_TABLET | Freq: Three times a day (TID) | ORAL | Status: DC | PRN
  Administered 2023-09-10 – 2023-09-13 (×6): 325 mg via ORAL
  Filled 2023-09-10 (×6): qty 1

## 2023-09-10 MED ORDER — FUROSEMIDE 10 MG/ML IJ SOLN
40.0000 mg | Freq: Two times a day (BID) | INTRAMUSCULAR | Status: DC
  Administered 2023-09-10 – 2023-09-13 (×6): 40 mg via INTRAVENOUS
  Filled 2023-09-10 (×6): qty 4

## 2023-09-10 MED ORDER — INSULIN ASPART 100 UNIT/ML IJ SOLN
0.0000 [IU] | Freq: Three times a day (TID) | INTRAMUSCULAR | Status: DC
  Administered 2023-09-11: 1 [IU] via SUBCUTANEOUS

## 2023-09-10 MED ORDER — HYDRALAZINE HCL 20 MG/ML IJ SOLN: 5.0000 mg | INTRAMUSCULAR | Status: DC | PRN

## 2023-09-10 MED ORDER — SODIUM CHLORIDE 0.9 % IV SOLN
2.0000 g | INTRAVENOUS | Status: DC
  Administered 2023-09-10 – 2023-09-12 (×3): 2 g via INTRAVENOUS
  Filled 2023-09-10 (×3): qty 20

## 2023-09-10 MED ORDER — OXYCODONE HCL 5 MG PO TABS
10.0000 mg | ORAL_TABLET | Freq: Three times a day (TID) | ORAL | Status: DC | PRN
  Administered 2023-09-10 – 2023-09-13 (×7): 10 mg via ORAL
  Filled 2023-09-10 (×7): qty 2

## 2023-09-10 MED ORDER — BUSPIRONE HCL 5 MG PO TABS
15.0000 mg | ORAL_TABLET | Freq: Three times a day (TID) | ORAL | Status: DC
  Administered 2023-09-10 – 2023-09-13 (×8): 15 mg via ORAL
  Filled 2023-09-10 (×8): qty 3

## 2023-09-10 MED ORDER — ALBUTEROL SULFATE HFA 108 (90 BASE) MCG/ACT IN AERS: 2.0000 | INHALATION_SPRAY | RESPIRATORY_TRACT | Status: DC | PRN

## 2023-09-10 MED ORDER — DULOXETINE HCL 30 MG PO CPEP: 30.0000 mg | ORAL_CAPSULE | Freq: Every day | ORAL | Status: DC

## 2023-09-10 MED ORDER — PANTOPRAZOLE SODIUM 40 MG PO TBEC
40.0000 mg | DELAYED_RELEASE_TABLET | Freq: Every day | ORAL | Status: DC
  Administered 2023-09-11 – 2023-09-13 (×3): 40 mg via ORAL
  Filled 2023-09-10 (×3): qty 1

## 2023-09-10 MED ORDER — GABAPENTIN 400 MG PO CAPS
400.0000 mg | ORAL_CAPSULE | Freq: Three times a day (TID) | ORAL | Status: DC
  Administered 2023-09-10 – 2023-09-12 (×6): 400 mg via ORAL
  Filled 2023-09-10 (×6): qty 1

## 2023-09-10 MED ORDER — OXYCODONE-ACETAMINOPHEN 10-325 MG PO TABS: 1.0000 | ORAL_TABLET | Freq: Three times a day (TID) | ORAL | Status: DC | PRN

## 2023-09-10 MED ORDER — ATORVASTATIN CALCIUM 40 MG PO TABS
80.0000 mg | ORAL_TABLET | Freq: Every day | ORAL | Status: DC
  Administered 2023-09-11 – 2023-09-12 (×2): 80 mg via ORAL
  Filled 2023-09-10 (×3): qty 2

## 2023-09-10 MED ORDER — FUROSEMIDE 10 MG/ML IJ SOLN
40.0000 mg | INTRAMUSCULAR | Status: AC
  Administered 2023-09-10: 40 mg via INTRAVENOUS
  Filled 2023-09-10: qty 4

## 2023-09-10 MED ORDER — DOXYCYCLINE HYCLATE 100 MG PO TABS
100.0000 mg | ORAL_TABLET | Freq: Two times a day (BID) | ORAL | Status: DC
  Administered 2023-09-10 – 2023-09-13 (×6): 100 mg via ORAL
  Filled 2023-09-10 (×6): qty 1

## 2023-09-10 MED ORDER — INSULIN ASPART 100 UNIT/ML IJ SOLN
0.0000 [IU] | Freq: Every day | INTRAMUSCULAR | Status: DC
  Administered 2023-09-10: 2 [IU] via SUBCUTANEOUS

## 2023-09-10 MED ORDER — IOHEXOL 350 MG/ML SOLN
75.0000 mL | Freq: Once | INTRAVENOUS | Status: AC | PRN
  Administered 2023-09-10: 75 mL via INTRAVENOUS

## 2023-09-10 NOTE — ED Provider Notes (Signed)
[place] EMERGENCY DEPARTMENT AT [facility] Provider Note   CSN: [id number] Arrival date & time: 09/10/23  1105     History  Chief Complaint  Patient presents with   Shortness of Breath    Tanya Lowe is a 52 y.o. female.  52 year old female with history of COPD on 2 L home oxygen, CAD status post PCI, heart failure with preserved ejection fraction who presents to the emergency department with shortness of breath and cough.  For the past 3 weeks has had shortness of breath and cough.  Cough productive of white sputum.  No fevers or chills or chest pain.  Also has had episodes where when she attempts to ambulate she will feel as though she is going to pass out.  Feels that this is likely due to hypoxia because her oxygen levels have been in the mid 80s recently and she is typically above 90%.  Has been taking her inhaler with some improvement.  Has been compliant with her diuretics but feels that she is gaining weight and has some increased lower extremity swelling.  Called her pulmonologist who told her to come into the emergency department.       Home Medications Prior to Admission medications   Medication Sig Start Date End Date Taking? Authorizing Provider  aspirin EC 81 MG tablet Take 81 mg by mouth daily.   Yes [provider]  atorvastatin (LIPITOR) 80 MG tablet Take 1 tablet (80 mg total) by mouth daily at 6 PM. 10/26/14  Yes Dwana Melena, PA-C  budesonide-formoterol (SYMBICORT) 80-4.5 MCG/ACT inhaler Take 2 puffs first thing in am and then another 2 puffs about 12 hours later. 05/01/23  Yes Nyoka Cowden, MD  busPIRone (BUSPAR) 7.5 MG tablet Take 15 mg by mouth 3 (three) times daily.   Yes [provider]  cholecalciferol (VITAMIN D3) 25 MCG (1000 UNIT) tablet Take 1,000 Units by mouth daily.   Yes [provider]  DULoxetine (CYMBALTA) 30 MG capsule Take 30 mg by mouth daily. Take with 60 mg to equal 90 mg 07/07/22  Yes [provider]  DULoxetine (CYMBALTA) 60 MG capsule Take 60 mg by mouth daily. Take with 30 mg to equal 90 mg   Yes [provider]  FEROSUL 325 (65 Fe) MG tablet Take 325 mg by mouth every other day. 06/30/22  Yes [provider]  fexofenadine (ALLEGRA) 180 MG tablet Take 180 mg by mouth daily.   Yes [provider]  fluticasone (FLONASE) 50 MCG/ACT nasal spray Place 2 sprays into both nostrils daily as needed for allergies.  07/28/17  Yes [provider]  furosemide (LASIX) 20 MG tablet Take 1 tablet (20 mg total) by mouth daily. 07/14/22 09/10/23 Yes Sheikh, Omair Latif, DO  gabapentin (NEURONTIN) 300 MG capsule Take 900 mg by mouth 3 (three) times daily.   Yes [provider]  isosorbide mononitrate (IMDUR) 30 MG 24 hr tablet TAKE ONE TABLET BY MOUTH ONCE DAILY 07/20/23  Yes Strader, [place] M, PA-C  metFORMIN (GLUCOPHAGE) 500 MG tablet Take 500 mg by mouth daily with breakfast. 04/30/20  Yes [provider]  metoprolol tartrate (LOPRESSOR) 25 MG tablet Take 1 tablet (25 mg total) by mouth 2 (two) times daily. Patient taking differently: Take 25 mg by mouth daily. 08/05/17  Yes Branch, Dorothe Pea, MD  metroNIDAZOLE (METROCREAM) 0.75 % cream Apply 1 Application topically 2 (two) times daily. 08/11/23  Yes [provider]  nitroGLYCERIN (NITROSTAT) 0.4  MG SL tablet Place 1 tablet (0.4 mg total) under the tongue every 5 (five) minutes as needed for chest pain. 11/28/15  Yes BranchDorothe Pea, MD  omeprazole (PRILOSEC) 20 MG capsule Take 20 mg by mouth daily. 03/09/22  Yes [provider]  oxyCODONE-acetaminophen (PERCOCET) 10-325 MG per tablet Take 1 tablet by mouth every 8 (eight) hours as needed for pain.   Yes [provider]  tiZANidine (ZANAFLEX) 4 MG tablet Take 4 mg by mouth 3 (three) times daily. 02/22/15  Yes [provider]      Allergies    Lyrica [pregabalin], Bactrim [sulfamethoxazole-trimethoprim], and  Prednisone    Review of Systems   Review of Systems  Physical Exam Updated Vital Signs BP 111/74 (BP Location: Right Arm)   Pulse 66   Temp 98.3 F (36.8 C) (Oral)   Resp 18   Ht 5\' 2"  (1.575 m)   Wt 82.5 kg   SpO2 90%   BMI 33.27 kg/m  Physical Exam Vitals and nursing note reviewed.  Constitutional:      General: She is not in acute distress.    Appearance: She is well-developed.     Comments: On 3 L nasal cannula  HENT:     Head: Normocephalic and atraumatic.     Right Ear: External ear normal.     Left Ear: External ear normal.     Nose: Nose normal.  Eyes:     Extraocular Movements: Extraocular movements intact.     Conjunctiva/sclera: Conjunctivae normal.     Pupils: Pupils are equal, round, and reactive to light.  Cardiovascular:     Rate and Rhythm: Normal rate and regular rhythm.     Heart sounds: No murmur heard. Pulmonary:     Effort: Pulmonary effort is normal. No respiratory distress.     Breath sounds: Rales (Left base) present.  Musculoskeletal:     Cervical back: Normal range of motion and neck supple.     Right lower leg: Edema present.     Left lower leg: Edema present.  Skin:    General: Skin is warm and dry.  Neurological:     Mental Status: She is alert and oriented to person, place, and time. Mental status is at baseline.  Psychiatric:        Mood and Affect: Mood normal.     ED Results / Procedures / Treatments   Labs (all labs ordered are listed, but only abnormal results are displayed) Labs Reviewed  BRAIN NATRIURETIC PEPTIDE - Abnormal; Notable for the following components:      Result Value   B Natriuretic Peptide 1,340.0 (*)    All other components within normal limits  CBC WITH DIFFERENTIAL/PLATELET - Abnormal; Notable for the following components:   Hemoglobin 11.4 (*)    RDW 17.2 (*)    All other components within normal limits  COMPREHENSIVE METABOLIC PANEL - Abnormal; Notable for the following components:   Sodium 134 (*)     Glucose, Bld 117 (*)    Calcium 8.5 (*)    Albumin 3.0 (*)    All other components within normal limits  D-DIMER, QUANTITATIVE - Abnormal; Notable for the following components:   D-Dimer, Quant 1.09 (*)    All other components within normal limits  HEMOGLOBIN A1C - Abnormal; Notable for the following components:   Hgb A1c MFr Bld 6.3 (*)    All other components within normal limits  BASIC METABOLIC PANEL - Abnormal; Notable for the following components:  Glucose, Bld 107 (*)    All other components within normal limits  CBC - Abnormal; Notable for the following components:   RDW 17.2 (*)    All other components within normal limits  GLUCOSE, CAPILLARY - Abnormal; Notable for the following components:   Glucose-Capillary 205 (*)    All other components within normal limits  GLUCOSE, CAPILLARY - Abnormal; Notable for the following components:   Glucose-Capillary 107 (*)    All other components within normal limits  TROPONIN I (HIGH SENSITIVITY) - Abnormal; Notable for the following components:   Troponin I (High Sensitivity) 46 (*)    All other components within normal limits  TROPONIN I (HIGH SENSITIVITY) - Abnormal; Notable for the following components:   Troponin I (High Sensitivity) 41 (*)    All other components within normal limits  RESP PANEL BY RT-PCR (RSV, FLU A&B, COVID)  RVPGX2  HIV ANTIBODY (ROUTINE TESTING W REFLEX)    EKG EKG Interpretation Date/Time:  Thursday September 10 2023 11:24:29 EST Ventricular Rate:  61 PR Interval:  164 QRS Duration:  76 QT Interval:  418 QTC Calculation: 420 R Axis:   104  Text Interpretation: Normal sinus rhythm Possible Right ventricular hypertrophy ST & T wave abnormality, consider inferior ischemia ST & T wave abnormality, consider anterolateral ischemia Abnormal ECG When compared with ECG of 10-Jul-2022 17:00,  ST depressions more pronounced Confirmed by Vonita Moss [phone]) on 09/10/2023 11:53:01 AM  Radiology CT  Angio Chest Pulmonary Embolism (PE) W or WO Contrast  Result Date: 09/10/2023 CLINICAL DATA:  Pulmonary embolism (PE) suspected, low to intermediate prob, positive D-dimer. Cough with shortness of breath and congestion. EXAM: CT ANGIOGRAPHY CHEST WITH CONTRAST TECHNIQUE: Multidetector CT imaging of the chest was performed using the standard protocol during bolus administration of intravenous contrast. Multiplanar CT image reconstructions and MIPs were obtained to evaluate the vascular anatomy. RADIATION DOSE REDUCTION: This exam was performed according to the departmental dose-optimization program which includes automated exposure control, adjustment of the mA and/or kV according to patient size and/or use of iterative reconstruction technique. CONTRAST:  75mL OMNIPAQUE IOHEXOL 350 MG/ML SOLN COMPARISON:  None Available. FINDINGS: Cardiovascular: No evidence of embolism to the proximal subsegmental pulmonary artery level. Normal cardiac size. No pericardial effusion. No aortic aneurysm. There are coronary artery calcifications, in keeping with coronary artery disease. There are also mild peripheral atherosclerotic vascular calcifications of thoracic aorta and its major branches. Mediastinum/Nodes: Visualized thyroid gland appears grossly unremarkable. No solid / cystic mediastinal masses. The esophagus is nondistended precluding optimal assessment. Redemonstration of multiple enlarged mediastinal and hilar lymph nodes for example, right lower paratracheal lymph node measuring up to 15 x 23 mm. Prevascular lymph node measuring up to 20 x 28 mm. These are grossly similar to the prior study. These lymph nodes were non FDG avid on the prior PET-CT scan from 07/31/2022. No axillary lymphadenopathy by size criteria. Lungs/Pleura: There is trace right pleural effusion. There are ground-glass changes throughout bilateral lungs with associated smooth interlobular septal thickening, compatible with crazy paving pattern,  which is nonspecific. Differential diagnosis includes pulmonary edema, ARDS, acute interstitial pneumonia, pulmonary alveolar proteinosis, etc. There is also mosaic attenuation of lungs, consistent with heterogeneous air trapping related to small airways disease. No mass or consolidation. No left pleural effusion. No pneumothorax. No suspicious lung nodules. Upper Abdomen: Visualized upper abdominal viscera within normal limits. Musculoskeletal: The visualized soft tissues of the chest wall are grossly unremarkable. A neurostimulator device is noted with its tip along  the posterior aspect of the spinal canal at T7-8 level. No suspicious osseous lesions. There are mild multilevel degenerative changes in the visualized spine. Review of the MIP images confirms the above findings. IMPRESSION: 1. No evidence of embolism to the proximal subsegmental pulmonary artery level. 2. Crazy paving pattern of bilateral lungs. Differential diagnosis includes pulmonary edema, ARDS, acute interstitial pneumonia, pulmonary alveolar proteinosis, etc. 3. Mosaic attenuation of bilateral lungs, consistent with heterogeneous air trapping related to small airway disease. 4. Small right pleural effusion. 5. Persistent moderate mediastinal and hilar lymphadenopathy, of indeterminate age but likely benign/reactive in the given clinical settings. 6. Multiple other nonacute observations, as described above. Aortic Atherosclerosis (ICD10-I70.0). Electronically Signed   By: Jules Schick M.D.   On: 09/10/2023 16:19   DG Chest 2 View  Result Date: 09/10/2023 CLINICAL DATA:  52 year old female with shortness of breath on exertion. Weakness for 3 weeks. Smoker. Interstitial lung disease. EXAM: CHEST - 2 VIEW COMPARISON:  Chest CT 01/29/2023 and earlier. FINDINGS: PA and lateral views 1134 hours. Thoracic spinal stimulator device is chronic. Stable lung volumes. Mediastinal contours remain within normal limits. Visualized tracheal air column is  within normal limits. Widespread chronic coarse pulmonary interstitium, with chronic widespread subpleural scarring and interstitial thickening by CT this year. No pneumothorax, pulmonary edema, pleural effusion or confluent lung opacity. No acute osseous abnormality identified. Negative visible bowel gas. IMPRESSION: Chronic interstitial lung disease. No acute cardiopulmonary abnormality. Electronically Signed   By: Odessa Fleming M.D.   On: 09/10/2023 12:45    Procedures Procedures    Medications Ordered in ED Medications  albuterol (VENTOLIN HFA) 108 (90 Base) MCG/ACT inhaler 2 puff (has no administration in time range)  aspirin EC tablet 81 mg (81 mg Oral Given 09/11/23 0847)  atorvastatin (LIPITOR) tablet 80 mg (has no administration in time range)  isosorbide mononitrate (IMDUR) 24 hr tablet 30 mg (30 mg Oral Given 09/11/23 0847)  metoprolol tartrate (LOPRESSOR) tablet 25 mg (25 mg Oral Given 09/11/23 0847)  nitroGLYCERIN (NITROSTAT) SL tablet 0.4 mg (has no administration in time range)  busPIRone (BUSPAR) tablet 15 mg (15 mg Oral Given 09/11/23 0847)  pantoprazole (PROTONIX) EC tablet 40 mg (40 mg Oral Given 09/11/23 0848)  gabapentin (NEURONTIN) capsule 400 mg (400 mg Oral Given 09/11/23 0848)  tiZANidine (ZANAFLEX) tablet 4 mg (4 mg Oral Given 09/11/23 0847)  mometasone-formoterol (DULERA) 100-5 MCG/ACT inhaler 2 puff (2 puffs Inhalation Given 09/11/23 0929)  furosemide (LASIX) injection 40 mg (40 mg Intravenous Given 09/11/23 0848)  enoxaparin (LOVENOX) injection 40 mg (has no administration in time range)  hydrALAZINE (APRESOLINE) injection 5 mg (has no administration in time range)  nicotine (NICODERM CQ - dosed in mg/24 hours) patch 21 mg (21 mg Transdermal Patient Refused/Not Given 09/10/23 2017)  insulin aspart (novoLOG) injection 0-9 Units ( Subcutaneous Not Given 09/11/23 0801)  insulin aspart (novoLOG) injection 0-5 Units (2 Units Subcutaneous Given 09/10/23 2106)  doxycycline  (VIBRA-TABS) tablet 100 mg (100 mg Oral Given 09/11/23 0853)  cefTRIAXone (ROCEPHIN) 2 g in sodium chloride 0.9 % 100 mL IVPB (2 g Intravenous Started During Downtime 09/10/23 1839)  DULoxetine (CYMBALTA) DR capsule 90 mg (90 mg Oral Given 09/11/23 0847)  oxyCODONE (Oxy IR/ROXICODONE) immediate release tablet 10 mg (10 mg Oral Given 09/11/23 0900)    And  acetaminophen (TYLENOL) tablet 325 mg (325 mg Oral Given 09/11/23 0900)  furosemide (LASIX) injection 40 mg (40 mg Intravenous Given 09/10/23 1408)  iohexol (OMNIPAQUE) 350 MG/ML injection 75 mL (75  mLs Intravenous Contrast Given 09/10/23 1347)    ED Course/ Medical Decision Making/ A&P Clinical Course as of 09/11/23 1013  Thu Sep 10, 2023  1541 Signed out to Dr Estell Harpin [RP]    Clinical Course User Index [RP] Rondel Baton, MD                                 Medical Decision Making Amount and/or Complexity of Data Reviewed Labs: ordered. Radiology: ordered.  Risk Prescription drug management. Decision regarding hospitalization.   Tanya Lowe is a 52 y.o. female with comorbidities that complicate the patient evaluation including COPD on 2 L home oxygen, CAD status post PCI, heart failure with preserved ejection fraction who presents to the emergency department with shortness of breath and cough.   Initial Ddx:  CHF exacerbation, COPD, MI, PE  MDM/Course:  Patient presents to the emergency department with shortness of breath and cough.  Has also been getting hypoxic and presyncopal when walking.  On arrival does have increased oxygen requirement.  Does have Rales and feels volume overloaded.  BNP was elevated and suspect that large component of this may be heart failure exacerbation.  Curiously there was not significant pulmonary edema on her chest x-ray though.  COVID and flu were negative.  Troponin mildly elevated but flat so low concern for ACS.  Did have a D-dimer that was sent to evaluate for PE which was elevated and  CTA was ordered and we are awaiting the read at this time.  Did also consider COPD exacerbation but is not having significant wheezing at this time.  Given some Lasix for her suspected heart failure exacerbation.  Upon re-evaluation patient remained stable.  Signed out to the oncoming physician awaiting CT read and admission  This patient presents to the ED for concern of complaints listed in HPI, this involves an extensive number of treatment options, and is a complaint that carries with it a high risk of complications and morbidity. Disposition including potential need for admission considered.   Dispo: Admit to Floor  Records reviewed Outpatient Clinic Notes The following labs were independently interpreted: Chemistry and show no acute abnormality I independently reviewed the following imaging with scope of interpretation limited to determining acute life threatening conditions related to emergency care: Chest x-ray and agree with the radiologist interpretation with the following exceptions: none I personally reviewed and interpreted cardiac monitoring: normal sinus rhythm  I personally reviewed and interpreted the pt's EKG: see above for interpretation  I have reviewed the patients home medications and made adjustments as needed  Portions of this note were generated with Dragon dictation software. Dictation errors may occur despite best attempts at proofreading.           Final Clinical Impression(s) / ED Diagnoses Final diagnoses:  Systolic congestive heart failure, unspecified HF chronicity (HCC)  Hypoxia    Rx / DC Orders ED Discharge Orders     None         Rondel Baton, MD 09/11/23 1013

## 2023-09-10 NOTE — ED Notes (Signed)
Pt taken for imaging.

## 2023-09-10 NOTE — ED Triage Notes (Signed)
Sob on exertion and weakness x3 weeks  EMS brought from PCP EMS vitals 107/82 65HR 98.4 oral  Sats low 80's on 2LPM  3LPM 94%  Hx of chf and copd EMS gave 125 solumedrol  Crackles in B/L bases EMS 20G in LEFT hand

## 2023-09-10 NOTE — Plan of Care (Signed)

## 2023-09-10 NOTE — Telephone Encounter (Signed)
Dr. Mauri Reading at [facility] would like for this patient to be seen today---her O2 saturations are hovering between 82-88.  She does not want to send her to the ED if she does not have to---Dr. Mauri Reading will also speak with Dr. Sherene Sires if he would like  Her call back is [phone]

## 2023-09-10 NOTE — ED Notes (Signed)
Pt off to CT CT to bring pt to ED room 19 once completed

## 2023-09-10 NOTE — ED Provider Notes (Signed)
Patient with hypoxia secondary to congestive heart failure and COPD.  Patient will be admitted to medicine   Bethann Berkshire, MD 09/10/23 [phone]

## 2023-09-10 NOTE — H&P (Signed)
TRH H&P   Patient Demographics:    Tanya Lowe, is a 52 y.o. female  MRN: [id number]   DOB - 04-Jan-1971  Admit Date - 09/10/2023  Outpatient Primary MD for the patient is Joana Reamer, DO  Referring MD/NP/PA: Dr Estell Harpin  Patient coming from: PCP office  Chief Complaint  Patient presents with   Shortness of Breath      HPI:    Tanya Lowe  is a 52 y.o. female, : Tanya Lowe is a 52 y.o. female with medical history significant of CAD status post stent placements x 3, COPD, GERD, hypertension,  chronic hypoxic respiratory failure on 2 L oxygen at home, tobacco abuse, still smoking. -Patient presents to ED secondary to complaints of shortness of breath, patient had progressive dyspnea for last 2 to 3 weeks, cough, congestion, white phlegm, no fever, no chest pain, dyspnea worsening with activity, she went to see her PCP today, where she was noted saturating 83 to 85% on her baseline 2 L oxygen, and she was instructed to come to ED, patient reports she has been compliant with her inhalers, as well she is compliant with her diuresis at home, but report some mild lower extremity edema, and fluid retention and weight gain. -In ED patient had CTA chest which was negative for PE but significant for volume overload, and possible pneumonia, patient currently on 5 L nasal cannula upon my evaluation saturating 93%, ports she is feeling much better after receiving her IV Lasix, labs significant for elevated BNP at 1340, elevated high-sensitivity troponins 46, trending down at 41, no leukocytosis, her COVID test is negative,.  Hospitalist consulted to admit     Review of systems:     A full 10 point Review of Systems was done, except as stated above, all other Review of Systems were negative.   With Past History of the following :    Past Medical History:  Diagnosis Date   Arthritis     Bilateral hand pain    CAD (coronary artery disease)    a. s/p NSTEMI in 2016 with DES to mid-RCA, DES to [facility] and DES to mid-LAD b. low-risk NST in 05/2016 and 11/2018   Cervical radiculopathy    Chronic neck and back pain    Emphysema lung (HCC)    Fibromyalgia    Hypertension    Kidney stones    hx of w previous lithotripsy   MI (myocardial infarction) (HCC)    Pain management    [facility]   Thoracic back pain       Past Surgical History:  Procedure Laterality Date   CARDIAC SURGERY     CYSTOSCOPY WITH RETROGRADE PYELOGRAM, URETEROSCOPY AND STENT PLACEMENT Left 03/14/2015   Procedure: CYSTOSCOPY WITH LEFT RETROGRADE PYELOGRAM;  Surgeon: Alfredo Martinez, MD;  Location: [facility];  Service: Urology;  Laterality: Left;   CYSTOSCOPY WITH RETROGRADE PYELOGRAM, URETEROSCOPY  AND STENT PLACEMENT Left 03/20/2015   Procedure: CYSTOSCOPY WITH LEFT  RETROGRADE LEFT URETEROSCOPY AND LEFT  STENT ;  Surgeon: Bjorn Pippin, MD;  Location: [facility];  Service: Urology;  Laterality: Left;   HOLMIUM LASER APPLICATION Left 03/20/2015   Procedure: HOLMIUM LASER APPLICATION;  Surgeon: Bjorn Pippin, MD;  Location: [facility];  Service: Urology;  Laterality: Left;   LEFT HEART CATHETERIZATION WITH CORONARY ANGIOGRAM N/A 10/25/2014   Procedure: LEFT HEART CATHETERIZATION WITH CORONARY ANGIOGRAM;  Surgeon: Kathleene Hazel, MD;  Location: [facility] CATH LAB;  Service: Cardiovascular;  Laterality: N/A;   LITHOTRIPSY  about 2011   SPINAL FUSION     L5-S1   TUBAL LIGATION        Social History:     Social History   Tobacco Use   Smoking status: Every Day    Current packs/day: 0.75    Average packs/day: 0.8 packs/day for 32.9 years (24.7 ttl pk-yrs)    Types: Cigarettes    Start date: 10/25/1990   Smokeless tobacco: Never   Tobacco comments:    12 cigarettes a day.  Substance Use Topics   Alcohol use: No    Alcohol/week: 0.0 standard drinks of alcohol        Family History :     Family History  Problem  Relation Age of Onset   Diabetes Mother    Stroke Mother    Seizures Other    Cancer Other    Diabetes Other      Home Medications:   Prior to Admission medications   Medication Sig Start Date End Date Taking? Authorizing Provider  aspirin EC 81 MG tablet Take 81 mg by mouth daily.   Yes [provider]  atorvastatin (LIPITOR) 80 MG tablet Take 1 tablet (80 mg total) by mouth daily at 6 PM. 10/26/14  Yes Dwana Melena, PA-C  budesonide-formoterol (SYMBICORT) 80-4.5 MCG/ACT inhaler Take 2 puffs first thing in am and then another 2 puffs about 12 hours later. 05/01/23  Yes Nyoka Cowden, MD  busPIRone (BUSPAR) 7.5 MG tablet Take 15 mg by mouth 3 (three) times daily.   Yes [provider]  cholecalciferol (VITAMIN D3) 25 MCG (1000 UNIT) tablet Take 1,000 Units by mouth daily.   Yes [provider]  DULoxetine (CYMBALTA) 30 MG capsule Take 30 mg by mouth daily. Take with 60 mg to equal 90 mg 07/07/22  Yes [provider]  DULoxetine (CYMBALTA) 60 MG capsule Take 60 mg by mouth daily. Take with 30 mg to equal 90 mg   Yes [provider]  FEROSUL 325 (65 Fe) MG tablet Take 325 mg by mouth every other day. 06/30/22  Yes [provider]  fexofenadine (ALLEGRA) 180 MG tablet Take 180 mg by mouth daily.   Yes [provider]  fluticasone (FLONASE) 50 MCG/ACT nasal spray Place 2 sprays into both nostrils daily as needed for allergies.  07/28/17  Yes [provider]  furosemide (LASIX) 20 MG tablet Take 1 tablet (20 mg total) by mouth daily. 07/14/22 09/10/23 Yes Sheikh, Omair Latif, DO  gabapentin (NEURONTIN) 300 MG capsule Take 900 mg by mouth 3 (three) times daily.   Yes [provider]  isosorbide mononitrate (IMDUR) 30 MG 24 hr tablet TAKE ONE TABLET BY MOUTH ONCE DAILY 07/20/23  Yes Strader, [place] M, PA-C  metFORMIN (GLUCOPHAGE) 500 MG tablet Take 500 mg by mouth daily with breakfast. 04/30/20  Yes [provider]  metoprolol tartrate (LOPRESSOR) 25  MG tablet Take 1 tablet (25 mg total) by mouth 2 (two) times daily. Patient taking differently: Take 25 mg by mouth daily. 08/05/17  Yes Branch, Dorothe Pea, MD  metroNIDAZOLE (METROCREAM) 0.75 % cream Apply 1 Application topically 2 (two) times daily. 08/11/23  Yes [provider]  nitroGLYCERIN (NITROSTAT) 0.4 MG SL tablet Place 1 tablet (0.4 mg total) under the tongue every 5 (five) minutes as needed for chest pain. 11/28/15  Yes BranchDorothe Pea, MD  omeprazole (PRILOSEC) 20 MG capsule Take 20 mg by mouth daily. 03/09/22  Yes [provider]  oxyCODONE-acetaminophen (PERCOCET) 10-325 MG per tablet Take 1 tablet by mouth every 8 (eight) hours as needed for pain.   Yes [provider]  tiZANidine (ZANAFLEX) 4 MG tablet Take 4 mg by mouth 3 (three) times daily. 02/22/15  Yes [provider]     Allergies:     Allergies  Allergen Reactions   Lyrica [Pregabalin] Shortness Of Breath    Chest Pain    Bactrim [Sulfamethoxazole-Trimethoprim] Rash   Prednisone Palpitations and Hypertension          Physical Exam:   Vitals  Blood pressure (!) 140/86, pulse (!) 59, temperature 98.1 F (36.7 C), temperature source Oral, resp. rate 16, height 5\' 2"  (1.575 m), weight 81.2 kg, SpO2 92%.   1. General Frail elderly female, appears older than stated age, in mild discomfort secondary to dyspnea  2. Normal affect and insight, Not Suicidal or Homicidal, Awake Alert, Oriented X 3.  3. No F.N deficits, ALL C.Nerves Intact, Strength 5/5 all 4 extremities, Sensation intact all 4 extremities, Plantars down going.  4. Ears and Eyes appear Normal, Conjunctivae clear, PERRLA. Moist Oral Mucosa.  5. Supple Neck, No JVD, No cervical lymphadenopathy appriciated, No Carotid Bruits.  6. Symmetrical Chest wall movement, no wheezing, but crackles at the bases  7. RRR, No Gallops, Rubs or Murmurs, No Parasternal Heave.  8. Positive  Bowel Sounds, Abdomen Soft, No tenderness, No organomegaly appriciated,No rebound -guarding or rigidity.  9.  No Cyanosis, Normal Skin Turgor, No Skin Rash or Bruise.  10. Good muscle tone,  joints appear normal , no effusions, Normal ROM.     Data Review:    CBC Recent Labs  Lab 09/10/23 1204  WBC 9.4  HGB 11.4*  HCT 36.5  PLT 267  MCV 90.1  MCH 28.1  MCHC 31.2  RDW 17.2*  LYMPHSABS 1.7  MONOABS 0.3  EOSABS 0.0  BASOSABS 0.0   ------------------------------------------------------------------------------------------------------------------  Chemistries  Recent Labs  Lab 09/10/23 1204  NA 134*  K 4.2  CL 100  CO2 23  GLUCOSE 117*  BUN 13  CREATININE 0.86  CALCIUM 8.5*  AST 23  ALT 31  ALKPHOS 93  BILITOT 0.8   ------------------------------------------------------------------------------------------------------------------ estimated creatinine clearance is 73.8 mL/min (by C-G formula based on SCr of 0.86 mg/dL). ------------------------------------------------------------------------------------------------------------------ No results for input(s): "TSH", "T4TOTAL", "T3FREE", "THYROIDAB" in the last 72 hours.  Invalid input(s): "FREET3"  Coagulation profile No results for input(s): "INR", "PROTIME" in the last 168 hours. ------------------------------------------------------------------------------------------------------------------- Recent Labs    09/10/23 1204  DDIMER 1.09*   -------------------------------------------------------------------------------------------------------------------  Cardiac Enzymes No results for input(s): "CKMB", "TROPONINI", "MYOGLOBIN" in the last 168 hours.  Invalid input(s): "CK" ------------------------------------------------------------------------------------------------------------------    Component Value Date/Time   BNP 1,340.0 (H) 09/10/2023 1204      ---------------------------------------------------------------------------------------------------------------  Urinalysis    Component Value Date/Time   COLORURINE YELLOW 07/30/2015 1400   APPEARANCEUR CLEAR 07/30/2015 1400  LABSPEC 1.025 07/30/2015 1400   PHURINE 6.0 07/30/2015 1400   GLUCOSEU NEGATIVE 07/30/2015 1400   HGBUR TRACE (A) 07/30/2015 1400   BILIRUBINUR NEGATIVE 07/30/2015 1400   KETONESUR NEGATIVE 07/30/2015 1400   PROTEINUR NEGATIVE 07/30/2015 1400   UROBILINOGEN 0.2 07/30/2015 1400   NITRITE NEGATIVE 07/30/2015 1400   LEUKOCYTESUR NEGATIVE 07/30/2015 1400    ----------------------------------------------------------------------------------------------------------------   Imaging Results:    CT Angio Chest Pulmonary Embolism (PE) W or WO Contrast  Result Date: 09/10/2023 CLINICAL DATA:  Pulmonary embolism (PE) suspected, low to intermediate prob, positive D-dimer. Cough with shortness of breath and congestion. EXAM: CT ANGIOGRAPHY CHEST WITH CONTRAST TECHNIQUE: Multidetector CT imaging of the chest was performed using the standard protocol during bolus administration of intravenous contrast. Multiplanar CT image reconstructions and MIPs were obtained to evaluate the vascular anatomy. RADIATION DOSE REDUCTION: This exam was performed according to the departmental dose-optimization program which includes automated exposure control, adjustment of the mA and/or kV according to patient size and/or use of iterative reconstruction technique. CONTRAST:  75mL OMNIPAQUE IOHEXOL 350 MG/ML SOLN COMPARISON:  None Available. FINDINGS: Cardiovascular: No evidence of embolism to the proximal subsegmental pulmonary artery level. Normal cardiac size. No pericardial effusion. No aortic aneurysm. There are coronary artery calcifications, in keeping with coronary artery disease. There are also mild peripheral atherosclerotic vascular calcifications of thoracic aorta and its major  branches. Mediastinum/Nodes: Visualized thyroid gland appears grossly unremarkable. No solid / cystic mediastinal masses. The esophagus is nondistended precluding optimal assessment. Redemonstration of multiple enlarged mediastinal and hilar lymph nodes for example, right lower paratracheal lymph node measuring up to 15 x 23 mm. Prevascular lymph node measuring up to 20 x 28 mm. These are grossly similar to the prior study. These lymph nodes were non FDG avid on the prior PET-CT scan from 07/31/2022. No axillary lymphadenopathy by size criteria. Lungs/Pleura: There is trace right pleural effusion. There are ground-glass changes throughout bilateral lungs with associated smooth interlobular septal thickening, compatible with crazy paving pattern, which is nonspecific. Differential diagnosis includes pulmonary edema, ARDS, acute interstitial pneumonia, pulmonary alveolar proteinosis, etc. There is also mosaic attenuation of lungs, consistent with heterogeneous air trapping related to small airways disease. No mass or consolidation. No left pleural effusion. No pneumothorax. No suspicious lung nodules. Upper Abdomen: Visualized upper abdominal viscera within normal limits. Musculoskeletal: The visualized soft tissues of the chest wall are grossly unremarkable. A neurostimulator device is noted with its tip along the posterior aspect of the spinal canal at T7-8 level. No suspicious osseous lesions. There are mild multilevel degenerative changes in the visualized spine. Review of the MIP images confirms the above findings. IMPRESSION: 1. No evidence of embolism to the proximal subsegmental pulmonary artery level. 2. Crazy paving pattern of bilateral lungs. Differential diagnosis includes pulmonary edema, ARDS, acute interstitial pneumonia, pulmonary alveolar proteinosis, etc. 3. Mosaic attenuation of bilateral lungs, consistent with heterogeneous air trapping related to small airway disease. 4. Small right pleural  effusion. 5. Persistent moderate mediastinal and hilar lymphadenopathy, of indeterminate age but likely benign/reactive in the given clinical settings. 6. Multiple other nonacute observations, as described above. Aortic Atherosclerosis (ICD10-I70.0). Electronically Signed   By: Jules Schick M.D.   On: 09/10/2023 16:19   DG Chest 2 View  Result Date: 09/10/2023 CLINICAL DATA:  52 year old female with shortness of breath on exertion. Weakness for 3 weeks. Smoker. Interstitial lung disease. EXAM: CHEST - 2 VIEW COMPARISON:  Chest CT 01/29/2023 and earlier. FINDINGS: PA and lateral views 1134 hours. Thoracic  spinal stimulator device is chronic. Stable lung volumes. Mediastinal contours remain within normal limits. Visualized tracheal air column is within normal limits. Widespread chronic coarse pulmonary interstitium, with chronic widespread subpleural scarring and interstitial thickening by CT this year. No pneumothorax, pulmonary edema, pleural effusion or confluent lung opacity. No acute osseous abnormality identified. Negative visible bowel gas. IMPRESSION: Chronic interstitial lung disease. No acute cardiopulmonary abnormality. Electronically Signed   By: Odessa Fleming M.D.   On: 09/10/2023 12:45    EKG Vent. rate 61 BPM PR interval 164 ms QRS duration 76 ms QT/QTcB 418/420 ms P-R-T axes 72 104 252 Normal sinus rhythm Possible Right ventricular hypertrophy ST & T wave abnormality, consider inferior ischemia ST & T wave abnormality, consider anterolateral ischemia    Assessment & Plan:    Principal Problem:   Acute on chronic diastolic CHF (congestive heart failure) (HCC) Active Problems:   Obesity   Tobacco abuse   Essential hypertension   GERD (gastroesophageal reflux disease)   Type 2 diabetes mellitus (HCC)   CAD (coronary artery disease)   COPD GOLD 0 / active smoker/AB   Mediastinal lymphadenopathy  Acute on chronic respiratory failure with hypoxia Acute on chronic diastolic  CHF Possible pneumonia -At baseline on 2 L oxygen cannula, currently on 6 L oxygen -Imaging significant for volume overload and possible pneumonia -CTA chest negative for PE -Elevated BNP, CTA chest with evidence of volume overload, 06/2022 echo: LVEF 65-70%, mild to mod RV dysfunction, diastolic not described ,so admitted under CHF pathway, will continue with IV Lasix 40 mg IV twice daily, daily weights, strict ins and outs. -Will recheck 2D echo.  -She was encouraged to use incentive spirometer and flutter valve -CTA chest findings as well with suspicious pneumonia, so she will be started empirically on doxycycline and Rocephin  Elevated troponins -Non-ACS pattern, he denies any chest pain or shortness of breath, most likely in the setting of worsening hypoxia due to CHF, will obtain 2D echo.   COPD She has no wheezing, so we will hold on steroids, will continue with DuoNebs and as needed albuterol  HTN -Blood pressure acceptable, continue with home medication    Hyperlipidemia -Continue with statin   OSA -Continue with CPAP   CAD s/p stent placement -Continue aspirin, atorvastatin, Lopressor, nitroglycerin.   GERD -Continue Protonix   Lymphadenopathy -She has abnormally enlarged lymph nodes in the mediastinum and both hilar regions with possible increased size concerning for active inflammatory or neoplastic process -Recommending following up with a PET CT scan and tissue sampling this can be done in outpatient setting   T2DM Hold metformin, check A1c, continue with insulin sliding scale    Obesity  Body mass index is 32.74 kg/m.  Chronic back pain -Continue oxycodone per home regimen   Tobacco abuse Patient was counseled on tobacco abuse cessation  DVT Prophylaxis Lovenox   AM Labs Ordered, also please review Full Orders  Family Communication: Admission, patients condition and plan of care including tests being ordered have been discussed with the patient and  husband* who indicate understanding and agree with the plan and Code Status.  Code Status full  Likely DC to  home  Condition GUARDED    Consults called: none    Admission status: inpatient    Time spent in minutes : 70 minutes   Huey Bienenstock M.D on 09/10/2023 at 4:55 PM   [facility] - Office  [phone]

## 2023-09-10 NOTE — Telephone Encounter (Signed)
Spoke with Dr. Mauri Reading, she reports patient seen today for routine OV and "does not look well". She states patient reports feeling unwell for 3wks, with increased shob. She heard BLL crackles. Patient came in on 2L pulsed at 84-85%; increased to 3L pulsed and last at 89%. She would like to send patient to ED but wanted to know what Dr. Sherene Sires would recommend. Advised her we would order CXR and labs, she states she is inclined to do the same but will not be in the office tomorrow to evaluate results.   Spoke with Dr. Sherene Sires [id number]. He recommends ED for eval/treat current symptoms/findings.  Advised Dr. Sabino Gasser, she will recommend this to the patient. Scheduled f/u OV for tomorrow morning just in case she is released from ED. She will advise patient to contact us if appointment not needed. Will monitor chart for ED/admission.

## 2023-09-10 NOTE — ED Notes (Signed)
Noted that pt's sats in computer were low Went to bedside Great waveform Lips blue Increased O2 Informed Charge RN and respiratory

## 2023-09-10 NOTE — ED Notes (Signed)
ED TO INPATIENT HANDOFF REPORT  ED Nurse Name and Phone #:  Neldon Mc RN [phone]  S Name/Age/Gender Tanya Lowe 52 y.o. female Room/Bed: APA05/APA05  Code Status   Code Status: Full Code  Home/SNF/Other Home Patient oriented to: self, place, time, and situation Is this baseline? Yes   Triage Complete: Triage complete  Chief Complaint Acute on chronic diastolic CHF (congestive heart failure) (HCC) [I50.33]  Triage Note Sob on exertion and weakness x3 weeks  EMS brought from PCP EMS vitals 107/82 65HR 98.4 oral  Sats low 80's on 2LPM  3LPM 94%  Hx of chf and copd EMS gave 125 solumedrol  Crackles in B/L bases EMS 20G in LEFT hand    Allergies Allergies  Allergen Reactions   Lyrica [Pregabalin] Shortness Of Breath    Chest Pain    Bactrim [Sulfamethoxazole-Trimethoprim] Rash   Prednisone Palpitations and Hypertension         Level of Care/Admitting Diagnosis ED Disposition     ED Disposition  Admit   Condition  --   Comment  Hospital Area: [facility] [[id number]]  Level of Care: Telemetry [5]  Covid Evaluation: Confirmed COVID Negative  Diagnosis: Acute on chronic diastolic CHF (congestive heart failure) [facility]) [[id number]]  Admitting Physician: Chiquita Loth  Attending Physician: Randol Kern, DAWOOD S [[id number]]  Certification:: I certify this patient will need inpatient services for at least 2 midnights  Expected Medical Readiness: 09/13/2023          B Medical/Surgery History Past Medical History:  Diagnosis Date   Arthritis    Bilateral hand pain    CAD (coronary artery disease)    a. s/p NSTEMI in 2016 with DES to mid-RCA, DES to [facility] and DES to mid-LAD b. low-risk NST in 05/2016 and 11/2018   Cervical radiculopathy    Chronic neck and back pain    Emphysema lung (HCC)    Fibromyalgia    Hypertension    Kidney stones    hx of w previous lithotripsy   MI (myocardial infarction) (HCC)    Pain management     [facility]   Thoracic back pain    Past Surgical History:  Procedure Laterality Date   CARDIAC SURGERY     CYSTOSCOPY WITH RETROGRADE PYELOGRAM, URETEROSCOPY AND STENT PLACEMENT Left 03/14/2015   Procedure: CYSTOSCOPY WITH LEFT RETROGRADE PYELOGRAM;  Surgeon: Alfredo Martinez, MD;  Location: [facility];  Service: Urology;  Laterality: Left;   CYSTOSCOPY WITH RETROGRADE PYELOGRAM, URETEROSCOPY AND STENT PLACEMENT Left 03/20/2015   Procedure: CYSTOSCOPY WITH LEFT  RETROGRADE LEFT URETEROSCOPY AND LEFT  STENT ;  Surgeon: Bjorn Pippin, MD;  Location: [facility];  Service: Urology;  Laterality: Left;   HOLMIUM LASER APPLICATION Left 03/20/2015   Procedure: HOLMIUM LASER APPLICATION;  Surgeon: Bjorn Pippin, MD;  Location: [facility];  Service: Urology;  Laterality: Left;   LEFT HEART CATHETERIZATION WITH CORONARY ANGIOGRAM N/A 10/25/2014   Procedure: LEFT HEART CATHETERIZATION WITH CORONARY ANGIOGRAM;  Surgeon: Kathleene Hazel, MD;  Location: [facility] CATH LAB;  Service: Cardiovascular;  Laterality: N/A;   LITHOTRIPSY  about 2011   SPINAL FUSION     L5-S1   TUBAL LIGATION       A IV Location/Drains/Wounds Patient Lines/Drains/Airways Status     Active Line/Drains/Airways     Name Placement date Placement time Site Days   Peripheral IV 09/10/23 20 G Left;Posterior Hand 09/10/23  1133  Hand  less than 1   Peripheral IV 09/10/23 18 G Left Antecubital  09/10/23  1342  Antecubital  less than 1   Ureteral Drain/Stent  6 Fr. 03/14/15  1350  --  3102   Ureteral Drain/Stent Left ureter 6 Fr. 03/20/15  1027  Left ureter  3096            Intake/Output Last 24 hours No intake or output data in the 24 hours ending 09/10/23 1728  Labs/Imaging Results for orders placed or performed during the hospital encounter of 09/10/23 (from the past 48 hour(s))  Resp panel by RT-PCR (RSV, Flu A&B, Covid) Anterior Nasal Swab     Status: None   Collection Time: 09/10/23 11:25 AM   Specimen: Anterior Nasal Swab  Result Value Ref  Range   SARS Coronavirus 2 by RT PCR NEGATIVE NEGATIVE    Comment: (NOTE) SARS-CoV-2 target nucleic acids are NOT DETECTED.  The SARS-CoV-2 RNA is generally detectable in upper respiratory specimens during the acute phase of infection. The lowest concentration of SARS-CoV-2 viral copies this assay can detect is 138 copies/mL. A negative result does not preclude SARS-Cov-2 infection and should not be used as the sole basis for treatment or other patient management decisions. A negative result may occur with  improper specimen collection/handling, submission of specimen other than nasopharyngeal swab, presence of viral mutation(s) within the areas targeted by this assay, and inadequate number of viral copies(<138 copies/mL). A negative result must be combined with clinical observations, patient history, and epidemiological information. The expected result is Negative.  Fact Sheet for Patients:  BloggerCourse.com  Fact Sheet for Healthcare Providers:  SeriousBroker.it  This test is no t yet approved or cleared by the [place] FDA and  has been authorized for detection and/or diagnosis of SARS-CoV-2 by FDA under an Emergency Use Authorization (EUA). This EUA will remain  in effect (meaning this test can be used) for the duration of the COVID-19 declaration under Section 564(b)(1) of the Act, 21 U.S.C.section 360bbb-3(b)(1), unless the authorization is terminated  or revoked sooner.       Influenza A by PCR NEGATIVE NEGATIVE   Influenza B by PCR NEGATIVE NEGATIVE    Comment: (NOTE) The Xpert Xpress SARS-CoV-2/FLU/RSV plus assay is intended as an aid in the diagnosis of influenza from Nasopharyngeal swab specimens and should not be used as a sole basis for treatment. Nasal washings and aspirates are unacceptable for Xpert Xpress SARS-CoV-2/FLU/RSV testing.  Fact Sheet for  Patients: BloggerCourse.com  Fact Sheet for Healthcare Providers: SeriousBroker.it  This test is not yet approved or cleared by the [place] FDA and has been authorized for detection and/or diagnosis of SARS-CoV-2 by FDA under an Emergency Use Authorization (EUA). This EUA will remain in effect (meaning this test can be used) for the duration of the COVID-19 declaration under Section 564(b)(1) of the Act, 21 U.S.C. section 360bbb-3(b)(1), unless the authorization is terminated or revoked.     Resp Syncytial Virus by PCR NEGATIVE NEGATIVE    Comment: (NOTE) Fact Sheet for Patients: BloggerCourse.com  Fact Sheet for Healthcare Providers: SeriousBroker.it  This test is not yet approved or cleared by the [place] FDA and has been authorized for detection and/or diagnosis of SARS-CoV-2 by FDA under an Emergency Use Authorization (EUA). This EUA will remain in effect (meaning this test can be used) for the duration of the COVID-19 declaration under Section 564(b)(1) of the Act, 21 U.S.C. section 360bbb-3(b)(1), unless the authorization is terminated or revoked.  Performed at [facility], [address], [place], [place] [address]   Brain  natriuretic peptide     Status: Abnormal   Collection Time: 09/10/23 12:04 PM  Result Value Ref Range   B Natriuretic Peptide 1,340.0 (H) 0.0 - 100.0 pg/mL    Comment: Performed at [facility], [address] [place], [place] [address]  CBC with Differential     Status: Abnormal   Collection Time: 09/10/23 12:04 PM  Result Value Ref Range   WBC 9.4 4.0 - 10.5 K/uL   RBC 4.05 3.87 - 5.11 MIL/uL   Hemoglobin 11.4 (L) 12.0 - 15.0 g/dL   HCT 69.6 29.5 - 28.4 %   MCV 90.1 80.0 - 100.0 fL   MCH 28.1 26.0 - 34.0 pg   MCHC 31.2 30.0 - 36.0 g/dL   RDW 13.2 (H) 44.0 - 10.2 %   Platelets 267 150 - 400 K/uL   nRBC 0.0 0.0 - 0.2 %   Neutrophils  Relative % 78 %   Neutro Abs 7.3 1.7 - 7.7 K/uL   Lymphocytes Relative 18 %   Lymphs Abs 1.7 0.7 - 4.0 K/uL   Monocytes Relative 4 %   Monocytes Absolute 0.3 0.1 - 1.0 K/uL   Eosinophils Relative 0 %   Eosinophils Absolute 0.0 0.0 - 0.5 K/uL   Basophils Relative 0 %   Basophils Absolute 0.0 0.0 - 0.1 K/uL   Immature Granulocytes 0 %   Abs Immature Granulocytes 0.03 0.00 - 0.07 K/uL    Comment: Performed at [facility], [address], [place], [place] [address]  Comprehensive metabolic panel     Status: Abnormal   Collection Time: 09/10/23 12:04 PM  Result Value Ref Range   Sodium 134 (L) 135 - 145 mmol/L   Potassium 4.2 3.5 - 5.1 mmol/L   Chloride 100 98 - 111 mmol/L   CO2 23 22 - 32 mmol/L   Glucose, Bld 117 (H) 70 - 99 mg/dL    Comment: Glucose reference range applies only to samples taken after fasting for at least 8 hours.   BUN 13 6 - 20 mg/dL   Creatinine, Ser 6.44 0.44 - 1.00 mg/dL   Calcium 8.5 (L) 8.9 - 10.3 mg/dL   Total Protein 6.6 6.5 - 8.1 g/dL   Albumin 3.0 (L) 3.5 - 5.0 g/dL   AST 23 15 - 41 U/L   ALT 31 0 - 44 U/L   Alkaline Phosphatase 93 38 - 126 U/L   Total Bilirubin 0.8 <1.2 mg/dL   GFR, Estimated >03 >47 mL/min    Comment: (NOTE) Calculated using the CKD-EPI Creatinine Equation (2021)    Anion gap 11 5 - 15    Comment: Performed at [facility], [address], [place], [place] [address]  D-dimer, quantitative     Status: Abnormal   Collection Time: 09/10/23 12:04 PM  Result Value Ref Range   D-Dimer, Quant 1.09 (H) 0.00 - 0.50 ug/mL-FEU    Comment: (NOTE) At the manufacturer cut-off value of 0.5 g/mL FEU, this assay has a negative predictive value of 95-100%.This assay is intended for use in conjunction with a clinical pretest probability (PTP) assessment model to exclude pulmonary embolism (PE) and deep venous thrombosis (DVT) in outpatients suspected of PE or DVT. Results should be correlated with clinical presentation. Performed at [facility], [address], [place], [place] [address]   Troponin I (High Sensitivity)     Status: Abnormal   Collection Time: 09/10/23 12:04 PM  Result Value Ref Range   Troponin I (High Sensitivity) 46 (H) <18 ng/L  Comment: (NOTE) Elevated high sensitivity troponin I (hsTnI) values and significant  changes across serial measurements may suggest ACS but many other  chronic and acute conditions are known to elevate hsTnI results.  Refer to the "Links" section for chest pain algorithms and additional  guidance. Performed at [facility], [address], [place], [place] [address]   Troponin I (High Sensitivity)     Status: Abnormal   Collection Time: 09/10/23  2:55 PM  Result Value Ref Range   Troponin I (High Sensitivity) 41 (H) <18 ng/L    Comment: (NOTE) Elevated high sensitivity troponin I (hsTnI) values and significant  changes across serial measurements may suggest ACS but many other  chronic and acute conditions are known to elevate hsTnI results.  Refer to the "Links" section for chest pain algorithms and additional  guidance. Performed at [facility], [address] [place], [place] [address]    CT Angio Chest Pulmonary Embolism (PE) W or WO Contrast  Result Date: 09/10/2023 CLINICAL DATA:  Pulmonary embolism (PE) suspected, low to intermediate prob, positive D-dimer. Cough with shortness of breath and congestion. EXAM: CT ANGIOGRAPHY CHEST WITH CONTRAST TECHNIQUE: Multidetector CT imaging of the chest was performed using the standard protocol during bolus administration of intravenous contrast. Multiplanar CT image reconstructions and MIPs were obtained to evaluate the vascular anatomy. RADIATION DOSE REDUCTION: This exam was performed according to the departmental dose-optimization program which includes automated exposure control, adjustment of the mA and/or kV according to patient size and/or use of iterative reconstruction technique. CONTRAST:  75mL OMNIPAQUE IOHEXOL 350 MG/ML  SOLN COMPARISON:  None Available. FINDINGS: Cardiovascular: No evidence of embolism to the proximal subsegmental pulmonary artery level. Normal cardiac size. No pericardial effusion. No aortic aneurysm. There are coronary artery calcifications, in keeping with coronary artery disease. There are also mild peripheral atherosclerotic vascular calcifications of thoracic aorta and its major branches. Mediastinum/Nodes: Visualized thyroid gland appears grossly unremarkable. No solid / cystic mediastinal masses. The esophagus is nondistended precluding optimal assessment. Redemonstration of multiple enlarged mediastinal and hilar lymph nodes for example, right lower paratracheal lymph node measuring up to 15 x 23 mm. Prevascular lymph node measuring up to 20 x 28 mm. These are grossly similar to the prior study. These lymph nodes were non FDG avid on the prior PET-CT scan from 07/31/2022. No axillary lymphadenopathy by size criteria. Lungs/Pleura: There is trace right pleural effusion. There are ground-glass changes throughout bilateral lungs with associated smooth interlobular septal thickening, compatible with crazy paving pattern, which is nonspecific. Differential diagnosis includes pulmonary edema, ARDS, acute interstitial pneumonia, pulmonary alveolar proteinosis, etc. There is also mosaic attenuation of lungs, consistent with heterogeneous air trapping related to small airways disease. No mass or consolidation. No left pleural effusion. No pneumothorax. No suspicious lung nodules. Upper Abdomen: Visualized upper abdominal viscera within normal limits. Musculoskeletal: The visualized soft tissues of the chest wall are grossly unremarkable. A neurostimulator device is noted with its tip along the posterior aspect of the spinal canal at T7-8 level. No suspicious osseous lesions. There are mild multilevel degenerative changes in the visualized spine. Review of the MIP images confirms the above findings. IMPRESSION: 1.  No evidence of embolism to the proximal subsegmental pulmonary artery level. 2. Crazy paving pattern of bilateral lungs. Differential diagnosis includes pulmonary edema, ARDS, acute interstitial pneumonia, pulmonary alveolar proteinosis, etc. 3. Mosaic attenuation of bilateral lungs, consistent with heterogeneous air trapping related to small airway disease. 4. Small right pleural effusion. 5. Persistent moderate mediastinal and  hilar lymphadenopathy, of indeterminate age but likely benign/reactive in the given clinical settings. 6. Multiple other nonacute observations, as described above. Aortic Atherosclerosis (ICD10-I70.0). Electronically Signed   By: Jules Schick M.D.   On: 09/10/2023 16:19   DG Chest 2 View  Result Date: 09/10/2023 CLINICAL DATA:  52 year old female with shortness of breath on exertion. Weakness for 3 weeks. Smoker. Interstitial lung disease. EXAM: CHEST - 2 VIEW COMPARISON:  Chest CT 01/29/2023 and earlier. FINDINGS: PA and lateral views 1134 hours. Thoracic spinal stimulator device is chronic. Stable lung volumes. Mediastinal contours remain within normal limits. Visualized tracheal air column is within normal limits. Widespread chronic coarse pulmonary interstitium, with chronic widespread subpleural scarring and interstitial thickening by CT this year. No pneumothorax, pulmonary edema, pleural effusion or confluent lung opacity. No acute osseous abnormality identified. Negative visible bowel gas. IMPRESSION: Chronic interstitial lung disease. No acute cardiopulmonary abnormality. Electronically Signed   By: Odessa Fleming M.D.   On: 09/10/2023 12:45    Pending Labs Unresulted Labs (From admission, onward)     Start     Ordered   09/10/23 1655  Hemoglobin A1c  Add-on,   AD       Comments: To assess prior glycemic control    09/10/23 1654   Signed and Held  HIV Antibody (routine testing w rflx)  (HIV Antibody (Routine testing w reflex) panel)  Tomorrow morning,   R        Signed  and Held   Signed and Held  Basic metabolic panel  Tomorrow morning,   R        Signed and Held   Signed and Held  CBC  Tomorrow morning,   R        Signed and Held            Vitals/Pain Today's Vitals   09/10/23 1335 09/10/23 1500 09/10/23 1530 09/10/23 1534  BP:  116/82 (!) 140/86   Pulse: (!) 59  (!) 59   Resp: 13 12 16    Temp:    98.1 F (36.7 C)  TempSrc:    Oral  SpO2: 93%  92%   Weight:      Height:      PainSc:        Isolation Precautions No active isolations  Medications Medications  albuterol (VENTOLIN HFA) 108 (90 Base) MCG/ACT inhaler 2 puff (has no administration in time range)  insulin aspart (novoLOG) injection 0-9 Units (has no administration in time range)  insulin aspart (novoLOG) injection 0-5 Units (has no administration in time range)  doxycycline (VIBRA-TABS) tablet 100 mg (has no administration in time range)  cefTRIAXone (ROCEPHIN) 2 g in sodium chloride 0.9 % 100 mL IVPB (has no administration in time range)  furosemide (LASIX) injection 40 mg (40 mg Intravenous Given 09/10/23 1408)  iohexol (OMNIPAQUE) 350 MG/ML injection 75 mL (75 mLs Intravenous Contrast Given 09/10/23 1347)    Mobility walks     Focused Assessments    R Recommendations: See Admitting Provider Note  Report given to:  Harriett Sine RN  Additional Notes:

## 2023-09-11 ENCOUNTER — Inpatient Hospital Stay (HOSPITAL_COMMUNITY): Payer: Medicare Other

## 2023-09-11 ENCOUNTER — Ambulatory Visit: Payer: Medicare Other | Admitting: Internal Medicine

## 2023-09-11 DIAGNOSIS — J9621 Acute and chronic respiratory failure with hypoxia: Secondary | ICD-10-CM

## 2023-09-11 DIAGNOSIS — G4733 Obstructive sleep apnea (adult) (pediatric): Secondary | ICD-10-CM

## 2023-09-11 DIAGNOSIS — I2510 Atherosclerotic heart disease of native coronary artery without angina pectoris: Secondary | ICD-10-CM

## 2023-09-11 DIAGNOSIS — J849 Interstitial pulmonary disease, unspecified: Secondary | ICD-10-CM

## 2023-09-11 DIAGNOSIS — I5031 Acute diastolic (congestive) heart failure: Secondary | ICD-10-CM | POA: Diagnosis not present

## 2023-09-11 DIAGNOSIS — I272 Pulmonary hypertension, unspecified: Secondary | ICD-10-CM

## 2023-09-11 DIAGNOSIS — I5033 Acute on chronic diastolic (congestive) heart failure: Secondary | ICD-10-CM | POA: Diagnosis not present

## 2023-09-11 LAB — GLUCOSE, CAPILLARY
Glucose-Capillary: 107 mg/dL — ABNORMAL HIGH (ref 70–99)
Glucose-Capillary: 140 mg/dL — ABNORMAL HIGH (ref 70–99)
Glucose-Capillary: 85 mg/dL (ref 70–99)
Glucose-Capillary: 150 mg/dL — ABNORMAL HIGH (ref 70–99)

## 2023-09-11 LAB — BASIC METABOLIC PANEL
Anion gap: 12 (ref 5–15)
BUN: 14 mg/dL (ref 6–20)
CO2: 26 mmol/L (ref 22–32)
Calcium: 9.1 mg/dL (ref 8.9–10.3)
Chloride: 101 mmol/L (ref 98–111)
Creatinine, Ser: 0.82 mg/dL (ref 0.44–1.00)
GFR, Estimated: >60 mL/min (ref >60)
Glucose, Bld: 107 mg/dL — ABNORMAL HIGH (ref 70–99)
Potassium: 3.9 mmol/L (ref 3.5–5.1)
Sodium: 139 mmol/L (ref 135–145)

## 2023-09-11 LAB — CBC
HCT: 39.9 % (ref 36.0–46.0)
Hemoglobin: 12.3 g/dL (ref 12.0–15.0)
MCH: 27.9 pg (ref 26.0–34.0)
MCHC: 30.8 g/dL (ref 30.0–36.0)
MCV: 90.5 fL (ref 80.0–100.0)
Platelets: 254 10*3/uL (ref 150–400)
RBC: 4.41 MIL/uL (ref 3.87–5.11)
RDW: 17.2 % — ABNORMAL HIGH (ref 11.5–15.5)
WBC: 6.2 10*3/uL (ref 4.0–10.5)
nRBC: 0 % (ref 0.0–0.2)

## 2023-09-11 LAB — ECHOCARDIOGRAM COMPLETE
Area-P 1/2: 3.42 cm2
Height: 62 in
S' Lateral: 2.6 cm
Weight: 2910.07 [oz_av]

## 2023-09-11 LAB — HIV ANTIBODY (ROUTINE TESTING W REFLEX): HIV Screen 4th Generation wRfx: NONREACTIVE

## 2023-09-11 NOTE — TOC Progression Note (Signed)
Transition of Care [facility]) - Progression Note    Patient Details  Name: Tanya Lowe MRN: [id number] Date of Birth: September 22, 1971  Transition of Care [facility]) CM/SW Contact  Isabella Bowens, [place] Phone Number: 09/11/2023, 11:31 AM  Clinical Narrative:   CSW spoke with patient about CHF and added education to AVS. Patient was agreeable to have a HHRN come out to the home. Patient has a supportive boyfriend who will pick her up once she is ready to DC. Home Health arranged with Jacquen[facility] Shone for RN. MD made aware of HHRN order needed.     Expected Discharge Plan: Home w Home Health Services Barriers to Discharge: Continued Medical Work up  Expected Discharge Plan and Services       Living arrangements for the past 2 months: Single Family Home                             HH Agency: [facility] Date [facility] Agency Contacted: 09/11/23 Time HH Agency Contacted: [phone] Representative spoke with at [facility] Agency: Denyse Amass   Social Determinants of Health (SDOH) Interventions SDOH Screenings   Food Insecurity: No Food Insecurity (09/10/2023)  Housing: Low Risk  (09/10/2023)  Transportation Needs: No Transportation Needs (09/10/2023)  Utilities: Not At Risk (09/10/2023)  Tobacco Use: High Risk (09/10/2023)    Readmission Risk Interventions    07/14/2022   12:12 PM  Readmission Risk Prevention Plan  Transportation Screening Complete  Home Care Screening Complete  Medication Review (RN CM) Complete

## 2023-09-11 NOTE — Consult Note (Signed)
Cardiology Consultation   Patient ID: ZAREYA PARRIS MRN: [id number]; DOB: [birth year]  Admit date: 09/10/2023 Date of Consult: 09/11/2023  PCP:  Joana Reamer, DO   [facility] Providers Cardiologist:  Dina Rich, MD        Patient Profile:   Tanya Lowe is a 52 y.o. female with a hx of CAD (s/p NSTEMI in 2016 with DES to mid-RCA, DES to [facility] and DES to mid-LAD, low-risk NST in 05/2016 and 11/2018), chronic HFpEF, HTN, HLD, prediabetes, fibromyalgia, COPD/chronic hypoxic respiratory failure, OSA (on CPAP) and continued tobacco use who is being seen 09/11/2023 for the evaluation of acute HFpEF at the request of Dr. Jarvis Newcomer.  History of Present Illness:   Tanya Lowe was last examined by Dr. Wyline Mood in 01/2023 and denied any recent chest pain or palpitations but did have chronic dyspnea and was on 2 L nasal cannula at baseline for COPD.  She was euvolemic by examination with weight at 175 lbs and was continued on Lasix 20 mg daily.  She presented to [facility] ED on 09/10/2023 for evaluation of worsening dyspnea on exertion and weakness for the past 3 weeks. Her oxygen saturations were in the low 80's on 2 L nasal cannula and quickly improved with 3 L. Says that she did notice shortness of breath and fatigue with exertion. No specific orthopnea or PND. Has noticed some abdominal distention but has not been weighing herself regularly and was unsure of an acute weight gain. Says her baseline weight is typically around 174 lbs and she was at 181 lbs on admission. Reports compliance with Lasix 20 mg daily. Denies any acute dietary changes. She does have [place] sitting at her bedside but says she typically does not consume fast food.  Initial labs showed WBC 9.4, Hgb 11.4, platelets 267, Na+ 134, K+ 4.2 and creatinine 0.86.  Albumin low at 3.0. AST 23 and ALT 31. BNP elevated to 1340. D-dimer elevated at 1.09. Initial and repeat Hs troponin at 46 and 41. CXR showed  chronic interstitial lung disease with no acute abnormalities. CTA showed no evidence of a PE. Was noted to have a paving pattern of the bilateral lungs with differential including pulmonary edema/ARDS/interstitial pneumonia or alveolar proteinosis along with small airway disease, a small right pleural effusion and persistent moderate mediastinal and hilar lymphadenopathy. EKG showing normal sinus rhythm, heart rate 61 with TWI along inferior leads and V3-V6.   She was admitted for further management of acute hypoxic respiratory failure in the setting of CHF exacerbation and possible pneumonia. She was started on empiric antibiotics with Doxycycline and Rocephin. Was also started on IV Lasix 40 mg twice daily. I&O's not recorded and weights variable as listed as 179 lbs and 181 lbs today. Follow-up labs today do show her hyponatremia has improved with Na+ at 139.  K+ is stable at 3.9 with creatinine at 0.82.   Past Medical History:  Diagnosis Date   Arthritis    Bilateral hand pain    CAD (coronary artery disease)    a. s/p NSTEMI in 2016 with DES to mid-RCA, DES to [facility] and DES to mid-LAD b. low-risk NST in 05/2016 and 11/2018   Cervical radiculopathy    Chronic neck and back pain    Emphysema lung (HCC)    Fibromyalgia    Hypertension    Kidney stones    hx of w previous lithotripsy   MI (myocardial infarction) (HCC)    Pain management  [facility]   Thoracic back pain     Past Surgical History:  Procedure Laterality Date   CARDIAC SURGERY     CYSTOSCOPY WITH RETROGRADE PYELOGRAM, URETEROSCOPY AND STENT PLACEMENT Left 03/14/2015   Procedure: CYSTOSCOPY WITH LEFT RETROGRADE PYELOGRAM;  Surgeon: Alfredo Martinez, MD;  Location: [facility];  Service: Urology;  Laterality: Left;   CYSTOSCOPY WITH RETROGRADE PYELOGRAM, URETEROSCOPY AND STENT PLACEMENT Left 03/20/2015   Procedure: CYSTOSCOPY WITH LEFT  RETROGRADE LEFT URETEROSCOPY AND LEFT  STENT ;  Surgeon: Bjorn Pippin, MD;  Location: [facility];   Service: Urology;  Laterality: Left;   HOLMIUM LASER APPLICATION Left 03/20/2015   Procedure: HOLMIUM LASER APPLICATION;  Surgeon: Bjorn Pippin, MD;  Location: [facility];  Service: Urology;  Laterality: Left;   LEFT HEART CATHETERIZATION WITH CORONARY ANGIOGRAM N/A 10/25/2014   Procedure: LEFT HEART CATHETERIZATION WITH CORONARY ANGIOGRAM;  Surgeon: Kathleene Hazel, MD;  Location: [facility] CATH LAB;  Service: Cardiovascular;  Laterality: N/A;   LITHOTRIPSY  about 2011   SPINAL FUSION     L5-S1   TUBAL LIGATION       Home Medications:  Prior to Admission medications   Medication Sig Start Date End Date Taking? Authorizing Provider  aspirin EC 81 MG tablet Take 81 mg by mouth daily.   Yes [provider]  atorvastatin (LIPITOR) 80 MG tablet Take 1 tablet (80 mg total) by mouth daily at 6 PM. 10/26/14  Yes Dwana Melena, PA-C  budesonide-formoterol (SYMBICORT) 80-4.5 MCG/ACT inhaler Take 2 puffs first thing in am and then another 2 puffs about 12 hours later. 05/01/23  Yes Nyoka Cowden, MD  busPIRone (BUSPAR) 7.5 MG tablet Take 15 mg by mouth 3 (three) times daily.   Yes [provider]  cholecalciferol (VITAMIN D3) 25 MCG (1000 UNIT) tablet Take 1,000 Units by mouth daily.   Yes [provider]  DULoxetine (CYMBALTA) 30 MG capsule Take 30 mg by mouth daily. Take with 60 mg to equal 90 mg 07/07/22  Yes [provider]  DULoxetine (CYMBALTA) 60 MG capsule Take 60 mg by mouth daily. Take with 30 mg to equal 90 mg   Yes [provider]  FEROSUL 325 (65 Fe) MG tablet Take 325 mg by mouth every other day. 06/30/22  Yes [provider]  fexofenadine (ALLEGRA) 180 MG tablet Take 180 mg by mouth daily.   Yes [provider]  fluticasone (FLONASE) 50 MCG/ACT nasal spray Place 2 sprays into both nostrils daily as needed for allergies.  07/28/17  Yes [provider]  furosemide (LASIX) 20 MG tablet Take 1 tablet (20 mg total) by mouth daily.  07/14/22 09/10/23 Yes Sheikh, Omair Latif, DO  gabapentin (NEURONTIN) 300 MG capsule Take 900 mg by mouth 3 (three) times daily.   Yes [provider]  isosorbide mononitrate (IMDUR) 30 MG 24 hr tablet TAKE ONE TABLET BY MOUTH ONCE DAILY 07/20/23  Yes Dimitria Ketchum, [place] M, PA-C  metFORMIN (GLUCOPHAGE) 500 MG tablet Take 500 mg by mouth daily with breakfast. 04/30/20  Yes [provider]  metoprolol tartrate (LOPRESSOR) 25 MG tablet Take 1 tablet (25 mg total) by mouth 2 (two) times daily. Patient taking differently: Take 25 mg by mouth daily. 08/05/17  Yes Branch, Dorothe Pea, MD  metroNIDAZOLE (METROCREAM) 0.75 % cream Apply 1 Application topically 2 (two) times daily. 08/11/23  Yes [provider]  nitroGLYCERIN (NITROSTAT) 0.4 MG SL tablet Place 1 tablet (0.4 mg total) under the tongue every 5 (five) minutes  as needed for chest pain. 11/28/15  Yes BranchDorothe Pea, MD  omeprazole (PRILOSEC) 20 MG capsule Take 20 mg by mouth daily. 03/09/22  Yes [provider]  oxyCODONE-acetaminophen (PERCOCET) 10-325 MG per tablet Take 1 tablet by mouth every 8 (eight) hours as needed for pain.   Yes [provider]  tiZANidine (ZANAFLEX) 4 MG tablet Take 4 mg by mouth 3 (three) times daily. 02/22/15  Yes [provider]    Inpatient Medications: Scheduled Meds:  aspirin EC  81 mg Oral Daily   atorvastatin  80 mg Oral q1800   busPIRone  15 mg Oral TID   doxycycline  100 mg Oral Q12H   DULoxetine  90 mg Oral Daily   enoxaparin (LOVENOX) injection  40 mg Subcutaneous Q24H   furosemide  40 mg Intravenous Q12H   gabapentin  400 mg Oral TID   insulin aspart  0-5 Units Subcutaneous QHS   insulin aspart  0-9 Units Subcutaneous TID WC   isosorbide mononitrate  30 mg Oral Daily   metoprolol tartrate  25 mg Oral Daily   mometasone-formoterol  2 puff Inhalation BID   nicotine  21 mg Transdermal Daily   pantoprazole  40 mg Oral Daily   tiZANidine  4 mg Oral TID    Continuous Infusions:  cefTRIAXone (ROCEPHIN)  IV 2 g (09/10/23 1839)   PRN Meds: oxyCODONE **AND** acetaminophen, albuterol, hydrALAZINE, nitroGLYCERIN  Allergies:    Allergies  Allergen Reactions   Lyrica [Pregabalin] Shortness Of Breath    Chest Pain    Bactrim [Sulfamethoxazole-Trimethoprim] Rash   Prednisone Palpitations and Hypertension         Social History:   Social History   Socioeconomic History   Marital status: Divorced    Spouse name: Not on file   Number of children: Not on file   Years of education: Not on file   Highest education level: Not on file  Occupational History   Not on file  Tobacco Use   Smoking status: Every Day    Current packs/day: 0.75    Average packs/day: 0.8 packs/day for 32.9 years (24.7 ttl pk-yrs)    Types: Cigarettes    Start date: 10/25/1990   Smokeless tobacco: Never   Tobacco comments:    12 cigarettes a day.  Vaping Use   Vaping status: Never Used  Substance and Sexual Activity   Alcohol use: No    Alcohol/week: 0.0 standard drinks of alcohol   Drug use: No   Sexual activity: Not on file  Other Topics Concern   Not on file  Social History Narrative   Not on file    Family History:    Family History  Problem Relation Age of Onset   Diabetes Mother    Stroke Mother    Seizures Other    Cancer Other    Diabetes Other      ROS:  Please see the history of present illness.   All other ROS reviewed and negative.     Physical Exam/Data:   Vitals:   09/11/23 0500 09/11/23 0506 09/11/23 0849 09/11/23 0921  BP:  134/84 111/74   Pulse:  63 66   Resp:  18    Temp:  98.3 F (36.8 C) 98.3 F (36.8 C)   TempSrc:   Oral   SpO2:  93% 91% 90%  Weight: 82.5 kg     Height:        Intake/Output Summary (Last 24 hours) at 09/11/2023 0951 Last  data filed at 09/11/2023 0321 Gross per 24 hour  Intake 240.4 ml  Output --  Net 240.4 ml      09/11/2023    5:00 AM 09/10/2023   11:19 AM 04/08/2023   10:12 AM   Last 3 Weights  Weight (lbs) 181 lb 14.1 oz 179 lb 176 lb 12.8 oz  Weight (kg) 82.5 kg 81.194 kg 80.196 kg     Body mass index is 33.27 kg/m.  General:  Well nourished, well developed female appearing appearing in no acute distress HEENT: normal Neck: no JVD Vascular: No carotid bruits; Distal pulses 2+ bilaterally Cardiac:  normal S1, S2; RRR; no murmur  Lungs: No wheezing. Rales along right base.  Abd: soft, nontender, no hepatomegaly  Ext: no pitting edema Musculoskeletal:  No deformities, BUE and BLE strength normal and equal Skin: warm and dry  Neuro:  CNs 2-12 intact, no focal abnormalities noted Psych:  Normal affect   EKG:  The EKG was personally reviewed and demonstrates: Normal sinus rhythm, heart rate 61 with TWI along inferior leads and V3-V6.   Telemetry:  Telemetry was personally reviewed and demonstrates: NSR with episodes of sinsu bradycardia, HR in 50's to 60's.   Relevant CV Studies:  Echocardiogram: 06/2022 IMPRESSIONS     1. Left ventricular ejection fraction, by estimation, is 65 to 70%. The  left ventricle has normal function. The left ventricle has no regional  wall motion abnormalities.   2. RV apex appears to be thickening more normally (? McConnell's sign)      Compared to echo from 2022, RV function is different . Right  ventricular systolic function mild to moderately reduced. The right  ventricular size is mildly enlarged. There is normal pulmonary artery  systolic pressure.   3. Right atrial size was mildly dilated.   4. The mitral valve is normal in structure. Trivial mitral valve  regurgitation.   5. The aortic valve is tricuspid. Aortic valve regurgitation is not  visualized. Aortic valve sclerosis is present, with no evidence of aortic  valve stenosis.   6. The inferior vena cava is dilated in size with >50% respiratory  variability, suggesting right atrial pressure of 8 mmHg.   Laboratory Data:  High Sensitivity Troponin:   Recent  Labs  Lab 09/10/23 1204 09/10/23 1455  TROPONINIHS 46* 41*     Chemistry Recent Labs  Lab 09/10/23 1204 09/11/23 0409  NA 134* 139  K 4.2 3.9  CL 100 101  CO2 23 26  GLUCOSE 117* 107*  BUN 13 14  CREATININE 0.86 0.82  CALCIUM 8.5* 9.1  GFRNONAA >60 >60  ANIONGAP 11 12    Recent Labs  Lab 09/10/23 1204  PROT 6.6  ALBUMIN 3.0*  AST 23  ALT 31  ALKPHOS 93  BILITOT 0.8   Lipids No results for input(s): "CHOL", "TRIG", "HDL", "LABVLDL", "LDLCALC", "CHOLHDL" in the last 168 hours.  Hematology Recent Labs  Lab 09/10/23 1204 09/11/23 0409  WBC 9.4 6.2  RBC 4.05 4.41  HGB 11.4* 12.3  HCT 36.5 39.9  MCV 90.1 90.5  MCH 28.1 27.9  MCHC 31.2 30.8  RDW 17.2* 17.2*  PLT 267 254   Thyroid No results for input(s): "TSH", "FREET4" in the last 168 hours.  BNP Recent Labs  Lab 09/10/23 1204  BNP 1,340.0*    DDimer  Recent Labs  Lab 09/10/23 1204  DDIMER 1.09*     Radiology/Studies:  CT Angio Chest Pulmonary Embolism (PE) W or WO Contrast  Result  Date: 09/10/2023 CLINICAL DATA:  Pulmonary embolism (PE) suspected, low to intermediate prob, positive D-dimer. Cough with shortness of breath and congestion. EXAM: CT ANGIOGRAPHY CHEST WITH CONTRAST TECHNIQUE: Multidetector CT imaging of the chest was performed using the standard protocol during bolus administration of intravenous contrast. Multiplanar CT image reconstructions and MIPs were obtained to evaluate the vascular anatomy. RADIATION DOSE REDUCTION: This exam was performed according to the departmental dose-optimization program which includes automated exposure control, adjustment of the mA and/or kV according to patient size and/or use of iterative reconstruction technique. CONTRAST:  75mL OMNIPAQUE IOHEXOL 350 MG/ML SOLN COMPARISON:  None Available. FINDINGS: Cardiovascular: No evidence of embolism to the proximal subsegmental pulmonary artery level. Normal cardiac size. No pericardial effusion. No aortic aneurysm.  There are coronary artery calcifications, in keeping with coronary artery disease. There are also mild peripheral atherosclerotic vascular calcifications of thoracic aorta and its major branches. Mediastinum/Nodes: Visualized thyroid gland appears grossly unremarkable. No solid / cystic mediastinal masses. The esophagus is nondistended precluding optimal assessment. Redemonstration of multiple enlarged mediastinal and hilar lymph nodes for example, right lower paratracheal lymph node measuring up to 15 x 23 mm. Prevascular lymph node measuring up to 20 x 28 mm. These are grossly similar to the prior study. These lymph nodes were non FDG avid on the prior PET-CT scan from 07/31/2022. No axillary lymphadenopathy by size criteria. Lungs/Pleura: There is trace right pleural effusion. There are ground-glass changes throughout bilateral lungs with associated smooth interlobular septal thickening, compatible with crazy paving pattern, which is nonspecific. Differential diagnosis includes pulmonary edema, ARDS, acute interstitial pneumonia, pulmonary alveolar proteinosis, etc. There is also mosaic attenuation of lungs, consistent with heterogeneous air trapping related to small airways disease. No mass or consolidation. No left pleural effusion. No pneumothorax. No suspicious lung nodules. Upper Abdomen: Visualized upper abdominal viscera within normal limits. Musculoskeletal: The visualized soft tissues of the chest wall are grossly unremarkable. A neurostimulator device is noted with its tip along the posterior aspect of the spinal canal at T7-8 level. No suspicious osseous lesions. There are mild multilevel degenerative changes in the visualized spine. Review of the MIP images confirms the above findings. IMPRESSION: 1. No evidence of embolism to the proximal subsegmental pulmonary artery level. 2. Crazy paving pattern of bilateral lungs. Differential diagnosis includes pulmonary edema, ARDS, acute interstitial  pneumonia, pulmonary alveolar proteinosis, etc. 3. Mosaic attenuation of bilateral lungs, consistent with heterogeneous air trapping related to small airway disease. 4. Small right pleural effusion. 5. Persistent moderate mediastinal and hilar lymphadenopathy, of indeterminate age but likely benign/reactive in the given clinical settings. 6. Multiple other nonacute observations, as described above. Aortic Atherosclerosis (ICD10-I70.0). Electronically Signed   By: Jules Schick M.D.   On: 09/10/2023 16:19   DG Chest 2 View  Result Date: 09/10/2023 CLINICAL DATA:  52 year old female with shortness of breath on exertion. Weakness for 3 weeks. Smoker. Interstitial lung disease. EXAM: CHEST - 2 VIEW COMPARISON:  Chest CT 01/29/2023 and earlier. FINDINGS: PA and lateral views 1134 hours. Thoracic spinal stimulator device is chronic. Stable lung volumes. Mediastinal contours remain within normal limits. Visualized tracheal air column is within normal limits. Widespread chronic coarse pulmonary interstitium, with chronic widespread subpleural scarring and interstitial thickening by CT this year. No pneumothorax, pulmonary edema, pleural effusion or confluent lung opacity. No acute osseous abnormality identified. Negative visible bowel gas. IMPRESSION: Chronic interstitial lung disease. No acute cardiopulmonary abnormality. Electronically Signed   By: Odessa Fleming M.D.   On: 09/10/2023 12:45  Assessment and Plan:   1. Acute HFpEF - Presented with worsening dyspnea and found to have an acute CHF exacerbation with BNP elevated at 1340. Repeat echocardiogram was performed this morning with official results pending. Based off her weights, she is at least 6 to 7 pounds above baseline. She reports very frequent urination with IV Lasix despite I's and O's not being recorded. Would continue with IV Lasix 40 mg twice daily and follow I's and O's along with daily weights. Can also use a ReDS vest to help confirm volume  status. Based off echocardiogram results, she may require adjustment of GDMT. She was on Lasix 20 mg daily prior to admission and would either titrate this to 40 mg daily at discharge or add an SGLT2 inhibitor.  2. CAD/Elevated Troponin Values - She is s/p NSTEMI in 2016 with DES to mid-RCA, DES to distal-RCA and DES to mid-LAD. Most recent ischemic evaluation was a low-risk NST in 11/2018. Hs Troponin values have been flat at 46 and 41 this admission which is most consistent with demand ischemia as compared to ACS. Repeat echocardiogram is pending to assess for any structural abnormalities. - Continue ASA 81 mg daily, Atorvastatin 80 mg daily, Lopressor 25 mg twice daily and Imdur 30 mg daily. Given her COPD, could consider switching Lopressor to Bisoprolol prior to discharge.  3. COPD/Chronic Hypoxic Respiratory Failure - She is on 2L [place] at home. Empirically being treated for pneumonia and will defer antibiotic dosing to the admitting team. It might be ideal to check a procalcitonin as no clinical suggestion of pneumonia at this time.  4. HTN - Blood pressure is well-controlled, at 111/74 most recent check. Continue current medical therapy with Imdur 30 mg daily and Lopressor 25 mg twice daily.  5. HLD - She has been continued on PTA Atorvastatin 80 mg daily.  Risk Assessment/Risk Scores:    New York Heart Association (NYHA) Functional Class NYHA Class III   For questions or updates, please contact [facility] Please consult [email or web address] for contact info under    Signed, Ellsworth Lennox, PA-C  09/11/2023 9:51 AM

## 2023-09-11 NOTE — Progress Notes (Signed)
Nurse turned oxygen down to 2 liters on day shift. Saturation dropped to 77 at shift change. Oxygen increased back to 4 liters.

## 2023-09-11 NOTE — Progress Notes (Signed)
  Echocardiogram 2D Echocardiogram has been performed.  Delcie Roch 09/11/2023, 11:16 AM

## 2023-09-11 NOTE — Care Management Important Message (Signed)
Important Message  Patient Details  Name: Tanya Lowe MRN: [id number] Date of Birth: Jan 10, 1971   Important Message Given:  Yes - Medicare IM     Corey Harold 09/11/2023, 12:06 PM

## 2023-09-11 NOTE — Progress Notes (Signed)
[facility] PROGRESS NOTE  Tanya Lowe (DOB: Jun 28, 1971) ZOX:[id number] PCP: Joana Reamer, DO Outpatient Specialists: Cardiology, Dr. Wyline Mood  Brief Narrative: Tanya Lowe is a 52 y.o. female with a history of ICM, chronic HFpEF, COPD with chronic 2L O2-dependence, ongoing tobacco use, HTN, HLD, and obesity who presented to the ED on 09/10/2023 due to dyspnea having been sent from her PCP's office where she was hypoxemic. Work up including elevated BNP and pulmonary edema pattern on CTA chest was consistent with volume overload, so IV lasix was started, echocardiogram ordered, and the patient was admitted for acute on chronic hypoxic respiratory failure due to acute on chronic HFpEF.   Subjective: Feels her breathing is better than when she first got here in distress. But she's short of breath at rest, not near her baseline. Leg swelling has improved, urinating subjectively a lot. Boyfriend at bedside. No chest pain.   Objective: BP 101/64 (BP Location: Right Arm)   Pulse 63   Temp 98.3 F (36.8 C) (Oral)   Resp 16   Ht 5\' 2"  (1.575 m)   Wt 82.5 kg   SpO2 90%   BMI 33.27 kg/m   Gen: No distress Pulm: Good air exchange, no wheezing, scant bibasilar crackles, nonlabored on 4L O2 CV: RRR, no MRG, no JVD, trace LE edema GI: Soft, NT, ND, +BS Neuro: Alert and oriented. No new focal deficits. Ext: Warm, no deformities Skin: No rashes, lesions or ulcers on visualized skin   Assessment & Plan: Principal Problem:   Acute on chronic diastolic CHF (congestive heart failure) (HCC) Active Problems:   Obesity   Tobacco abuse   Essential hypertension   GERD (gastroesophageal reflux disease)   Type 2 diabetes mellitus (HCC)   CAD (coronary artery disease)   COPD GOLD 0 / active smoker/AB   Mediastinal lymphadenopathy  Acute on chronic hypoxic respiratory failure: CTA chest does have more broad DDx, though clinically not behaving like ARDS, no wheezing to suggest AECOPD, and  concomitant weight gain/edema, elevated BNP, and improvement with diuresis thus far supports diagnosis of acute CHF. Also treating for PNA given clinical uncertainty.  - Wean oxygen as tolerated to maintain normal WOB and SpO2 >89%. Down to 4L by [place], didn't tolerate CPAP, and home dose is 2L.  - Tx conditions below - Really needs to use IS and mobilize.   Acute on chronic HFpEF, HTN:  - Updating echo - Continue IV lasix, would appreciate cardiology evaluation.  - Monitor strict I/O, weights (inadequately recorded thus far, but subjectively good UOP).   Pneumonia: RSV, flu, covid PCRs negative.  - Complete 5 days abx.    Demand myocardial ischemia, hx CAD/NSTEMI: Mild and downtrending troponin elevation in setting of respiratory failure, no anginal complaints.  - Defer timing of ischemic evaluation to cardiology. Monitor wall motion on echo.  - Continue baseline ASA, statin, BB, prn NTG.   OSA:  - CPAP qHS  COPD:  - Continue controller medications and prns  Tobacco use:  - Cessation counseling providedGERD:  - PPI  Lymphadenopathy:  -She has abnormally enlarged lymph nodes in the mediastinum and both hilar regions with possible increased size concerning for active inflammatory or neoplastic process -Recommending following up with a PET CT scan and tissue sampling this can be done in outpatient setting  T2DM: HbA1c 6.3%.  - Hold metformin with contrast administration - SSI   Obesity: Body mass index is 33.27 kg/m.   Chronic back pain:  - Continue home Tx.  GERD:  - PPI  Tyrone Nine, MD [facility] [email or web address] 09/11/2023, 2:22 PM

## 2023-09-11 NOTE — Progress Notes (Signed)
Patient desated into the 70s on CPAP, patient placed back on 4.5L via [place] sats at 93%, no signs of distress noted. RT made aware of desaturation on cpap.

## 2023-09-12 ENCOUNTER — Encounter (HOSPITAL_COMMUNITY): Payer: Self-pay | Admitting: Internal Medicine

## 2023-09-12 ENCOUNTER — Inpatient Hospital Stay (HOSPITAL_COMMUNITY): Payer: Medicare Other

## 2023-09-12 DIAGNOSIS — I5033 Acute on chronic diastolic (congestive) heart failure: Secondary | ICD-10-CM | POA: Diagnosis not present

## 2023-09-12 LAB — BASIC METABOLIC PANEL
Anion gap: 11 (ref 5–15)
BUN: 15 mg/dL (ref 6–20)
CO2: 31 mmol/L (ref 22–32)
Calcium: 9.1 mg/dL (ref 8.9–10.3)
Chloride: 100 mmol/L (ref 98–111)
Creatinine, Ser: 0.84 mg/dL (ref 0.44–1.00)
GFR, Estimated: >60 mL/min (ref >60)
Glucose, Bld: 95 mg/dL (ref 70–99)
Potassium: 4.8 mmol/L (ref 3.5–5.1)
Sodium: 142 mmol/L (ref 135–145)

## 2023-09-12 LAB — GLUCOSE, CAPILLARY
Glucose-Capillary: 80 mg/dL (ref 70–99)
Glucose-Capillary: 91 mg/dL (ref 70–99)
Glucose-Capillary: 103 mg/dL — ABNORMAL HIGH (ref 70–99)
Glucose-Capillary: 115 mg/dL — ABNORMAL HIGH (ref 70–99)

## 2023-09-12 MED ORDER — GABAPENTIN 300 MG PO CAPS
900.0000 mg | ORAL_CAPSULE | Freq: Three times a day (TID) | ORAL | Status: DC
  Administered 2023-09-12 – 2023-09-13 (×2): 900 mg via ORAL
  Filled 2023-09-12 (×2): qty 3

## 2023-09-12 MED ORDER — TECHNETIUM TO 99M ALBUMIN AGGREGATED
4.0000 | Freq: Once | INTRAVENOUS | Status: AC | PRN
  Administered 2023-09-12: 4.3 via INTRAVENOUS

## 2023-09-12 NOTE — Progress Notes (Signed)
[facility] PROGRESS NOTE  Tanya Lowe (DOB: 1971-05-05) NGE:[id number] PCP: Joana Reamer, DO Outpatient Specialists: Cardiology, Dr. Wyline Mood  Brief Narrative: Tanya Lowe is a 52 y.o. female with a history of ICM, chronic HFpEF, COPD with chronic 2L O2-dependence, ongoing tobacco use, HTN, HLD, and obesity who presented to the ED on 09/10/2023 due to dyspnea having been sent from her PCP's office where she was hypoxemic. Work up including elevated BNP and pulmonary edema pattern on CTA chest was consistent with volume overload, so IV lasix was started, echocardiogram ordered, and the patient was admitted for acute on chronic hypoxic respiratory failure due to acute on chronic HFpEF.   Subjective: Wants to go home, breathing much better, has been urinating a lot. No new complaints. Still on 3L O2.   Objective: BP 106/61 (BP Location: Left Arm)   Pulse 63   Temp 98.5 F (36.9 C) (Oral)   Resp 17   Ht 5\' 2"  (1.575 m)   Wt 81.7 kg   SpO2 93%   BMI 32.94 kg/m   Gen: No distress Pulm: Bibasilar crackles, no wheezes, nonlabored at rest.  CV: RRR, no MRG GI: Soft, NT, ND, +BS  Neuro: Alert and oriented. No new focal deficits. Ext: Warm, no deformities. Skin: No new rashes, lesions or ulcers on visualized skin   Assessment & Plan: Principal Problem:   Acute on chronic diastolic CHF (congestive heart failure) (HCC) Active Problems:   Obesity   Tobacco abuse   Essential hypertension   GERD (gastroesophageal reflux disease)   Type 2 diabetes mellitus (HCC)   CAD (coronary artery disease)   COPD GOLD 0 / active smoker/AB   Mediastinal lymphadenopathy   ILD (interstitial lung disease) (HCC)   Severe pulmonary hypertension (HCC)   OSA on CPAP   Acute on chronic hypoxic respiratory failure (HCC)   Acute on chronic diastolic heart failure (HCC)  Acute on chronic hypoxic respiratory failure: CTA chest does have more broad DDx, though clinically not behaving like ARDS, no  wheezing to suggest AECOPD, and concomitant weight gain/edema, elevated BNP, and improvement with diuresis thus far supports diagnosis of acute CHF. Also treating for PNA given clinical uncertainty.  - Wean oxygen as tolerated to maintain normal WOB and SpO2 >89%. Down to 3L today, home dose is 2L.  - Tx conditions below - Really needs to use IS and mobilize.   Acute on chronic HFpEF, HTN:  - Updated echo shows preserved LVEF, moderate RV enlargement and systolic dysfunction, suggests severe pulmonary HTN. Check V/Q scan.  - Continue IV lasix BID, Cr stable. Recheck in AM. If down to home O2, discharge in AM. - Monitor strict I/O, weights (continues to be inadequately recorded thus far, but subjectively good UOP).   Pneumonia: RSV, flu, covid PCRs negative.  - Complete 5 days abx.    Demand myocardial ischemia, hx CAD/NSTEMI: Mild and downtrending troponin elevation in setting of respiratory failure, no anginal complaints.  - Defer timing of ischemic evaluation to cardiology. Monitor wall motion on echo.  - Continue baseline ASA, statin, BB, prn NTG.   OSA:  - CPAP qHS  COPD:  - Continue controller medications and prns  Tobacco use:  - Cessation counseling providedGERD:  - PPI  Lymphadenopathy:  -She has abnormally enlarged lymph nodes in the mediastinum and both hilar regions with possible increased size concerning for active inflammatory or neoplastic process -Recommending following up with a PET CT scan and tissue sampling this can be done in  outpatient setting  T2DM: HbA1c 6.3%.  - Hold metformin with contrast administration - SSI   Obesity: Body mass index is 32.94 kg/m.   Chronic back pain:  - Continue home Tx.    GERD:  - PPI  Tyrone Nine, MD [facility] [email or web address] 09/12/2023, 11:05 AM

## 2023-09-12 NOTE — Plan of Care (Signed)
Problem: Education: Goal: Knowledge of General Education information will improve Description: Including pain rating scale, medication(s)/side effects and non-pharmacologic comfort measures Outcome: Progressing   Problem: Health Behavior/Discharge Planning: Goal: Ability to manage health-related needs will improve Outcome: Progressing   Problem: Clinical Measurements: Goal: Ability to maintain clinical measurements within normal limits will improve Outcome: Progressing Goal: Will remain free from infection Outcome: Progressing Goal: Diagnostic test results will improve Outcome: Progressing Goal: Respiratory complications will improve Outcome: Progressing Goal: Cardiovascular complication will be avoided Outcome: Progressing   Problem: Activity: Goal: Risk for activity intolerance will decrease Outcome: Progressing   Problem: Nutrition: Goal: Adequate nutrition will be maintained Outcome: Progressing   Problem: Coping: Goal: Level of anxiety will decrease Outcome: Progressing   Problem: Elimination: Goal: Will not experience complications related to bowel motility Outcome: Progressing Goal: Will not experience complications related to urinary retention Outcome: Progressing   Problem: Pain Management: Goal: General experience of comfort will improve Outcome: Progressing   Problem: Safety: Goal: Ability to remain free from injury will improve Outcome: Progressing   Problem: Skin Integrity: Goal: Risk for impaired skin integrity will decrease Outcome: Progressing   Problem: Education: Goal: Ability to describe self-care measures that may prevent or decrease complications (Diabetes Survival Skills Education) will improve Outcome: Progressing Goal: Individualized Educational Video(s) Outcome: Progressing   Problem: Coping: Goal: Ability to adjust to condition or change in health will improve Outcome: Progressing   Problem: Fluid Volume: Goal: Ability to  maintain a balanced intake and output will improve Outcome: Progressing   Problem: Health Behavior/Discharge Planning: Goal: Ability to identify and utilize available resources and services will improve Outcome: Progressing Goal: Ability to manage health-related needs will improve Outcome: Progressing   Problem: Metabolic: Goal: Ability to maintain appropriate glucose levels will improve Outcome: Progressing   Problem: Nutritional: Goal: Maintenance of adequate nutrition will improve Outcome: Progressing Goal: Progress toward achieving an optimal weight will improve Outcome: Progressing   Problem: Skin Integrity: Goal: Risk for impaired skin integrity will decrease Outcome: Progressing   Problem: Tissue Perfusion: Goal: Adequacy of tissue perfusion will improve Outcome: Progressing   Problem: Education: Goal: Ability to demonstrate management of disease process will improve Outcome: Progressing Goal: Ability to verbalize understanding of medication therapies will improve Outcome: Progressing Goal: Individualized Educational Video(s) Outcome: Progressing   Problem: Activity: Goal: Capacity to carry out activities will improve Outcome: Progressing   Problem: Cardiac: Goal: Ability to achieve and maintain adequate cardiopulmonary perfusion will improve Outcome: Progressing

## 2023-09-13 DIAGNOSIS — I5033 Acute on chronic diastolic (congestive) heart failure: Secondary | ICD-10-CM | POA: Diagnosis not present

## 2023-09-13 LAB — BASIC METABOLIC PANEL
Anion gap: 11 (ref 5–15)
BUN: 17 mg/dL (ref 6–20)
CO2: 30 mmol/L (ref 22–32)
Calcium: 9 mg/dL (ref 8.9–10.3)
Chloride: 101 mmol/L (ref 98–111)
Creatinine, Ser: 0.72 mg/dL (ref 0.44–1.00)
GFR, Estimated: >60 mL/min (ref >60)
Glucose, Bld: 82 mg/dL (ref 70–99)
Potassium: 3 mmol/L — ABNORMAL LOW (ref 3.5–5.1)
Sodium: 142 mmol/L (ref 135–145)

## 2023-09-13 LAB — GLUCOSE, CAPILLARY
Glucose-Capillary: 85 mg/dL (ref 70–99)
Glucose-Capillary: 99 mg/dL (ref 70–99)

## 2023-09-13 MED ORDER — POTASSIUM CHLORIDE CRYS ER 20 MEQ PO TBCR
40.0000 meq | EXTENDED_RELEASE_TABLET | Freq: Once | ORAL | Status: AC
  Administered 2023-09-13: 40 meq via ORAL
  Filled 2023-09-13: qty 2

## 2023-09-13 MED ORDER — FUROSEMIDE 40 MG PO TABS: 40.0000 mg | ORAL_TABLET | Freq: Every day | ORAL | 0 refills | Status: DC

## 2023-09-13 MED ORDER — CEFDINIR 300 MG PO CAPS: 300.0000 mg | ORAL_CAPSULE | Freq: Two times a day (BID) | ORAL | 0 refills | Status: DC

## 2023-09-13 MED ORDER — DOXYCYCLINE HYCLATE 100 MG PO TABS: 100.0000 mg | ORAL_TABLET | Freq: Two times a day (BID) | ORAL | 0 refills | Status: DC

## 2023-09-13 NOTE — Plan of Care (Signed)

## 2023-09-13 NOTE — Progress Notes (Signed)
Pt has DC order. AVS was given and explained to pt. HH services was set up by CM. Significant other will provide transportation, reminded to bring the O2 for the pt.

## 2023-09-13 NOTE — Discharge Summary (Signed)
Physician Discharge Summary   Patient: Tanya Lowe MRN: [id number] DOB: Oct 22, 1970  Admit date:     09/10/2023  Discharge date: 09/13/23  Discharge Physician: Tyrone Nine   PCP: Joana Reamer, DO   Recommendations at discharge:  Follow up with cardiology after discharge for CHF hospitalization follow up. Increased lasix from 20mg  daily to 40mg  daily, will need repeat BMP and volume assessment.  Follow up with PCP in 1-2 weeks Follow up with pulmonology, Dr. Sherene Sires for ongoing management of chronic respiratory failure, consider PETCT for evaluation of lymphadenopathy (see details below).  Discharge Diagnoses: Principal Problem:   Acute on chronic diastolic CHF (congestive heart failure) (HCC) Active Problems:   Obesity   Tobacco abuse   Essential hypertension   GERD (gastroesophageal reflux disease)   Type 2 diabetes mellitus (HCC)   CAD (coronary artery disease)   COPD GOLD 0 / active smoker/AB   Mediastinal lymphadenopathy   ILD (interstitial lung disease) (HCC)   Severe pulmonary hypertension (HCC)   OSA on CPAP   Acute on chronic hypoxic respiratory failure (HCC)   Acute on chronic diastolic heart failure [facility])  Hospital Course: Tanya Lowe is a 52 y.o. female with a history of ICM, chronic HFpEF, COPD with chronic 2L O2-dependence, ongoing tobacco use, HTN, HLD, and obesity who presented to the ED on 09/10/2023 due to dyspnea having been sent from her PCP's office where she was hypoxemic. Work up including elevated BNP and pulmonary edema pattern on CTA chest was consistent with volume overload, so IV lasix was started, echocardiogram ordered, and the patient was admitted for acute on chronic hypoxic respiratory failure due to acute on chronic HFpEF.   Assessment and Plan: Acute on chronic hypoxic respiratory failure: CTA chest does have more broad DDx, though clinically not behaving like ARDS, no wheezing to suggest AECOPD, and concomitant weight gain/edema, elevated  BNP, and improvement with diuresis thus far supports diagnosis of acute CHF. Also treating for PNA given clinical uncertainty.  - Weaned to 2L.  - Tx conditions below - Really needs to use IS and mobilize.    Acute on chronic HFpEF, HTN:  - Updated echo shows preserved LVEF, moderate RV enlargement and systolic dysfunction, suggests severe pulmonary HTN. Perfusion scan negative.  - Increase 20mg  lasix dosing to 40mg  and follow up soon after discharge.   - Monitor weights at home   Pneumonia: RSV, flu, covid PCRs negative.  - Complete 5 days abx.     Demand myocardial ischemia, hx CAD/NSTEMI: Mild and downtrending troponin elevation in setting of respiratory failure, no anginal complaints.  - Defer ischemic evaluation per cardiology. No regional abnormalities in wall motion on echo.  - Continue baseline ASA, statin, BB, prn NTG.    OSA:  - CPAP qHS   COPD:  - Continue controller medications and prns   Tobacco use:  - Cessation counseling provided  GERD:  - PPI   Lymphadenopathy:  -She has abnormally enlarged lymph nodes in the mediastinum and both hilar regions with possible increased size concerning for active inflammatory or neoplastic process -Recommending following up with a PET CT scan and tissue sampling this can be done in outpatient setting   T2DM: HbA1c 6.3%.     Obesity: Body mass index is 32.94 kg/m.    Chronic back pain:  - Continue home Tx.     GERD:  - PPI  Consultants: Cardiology Procedures performed: Echo, perfusion scan  Disposition: Home Diet recommendation:  Discharge Diet Orders (  From admission, onward)     Start     Ordered   09/13/23 0000  Diet - low sodium heart healthy        09/13/23 0911           DISCHARGE MEDICATION: Allergies as of 09/13/2023       Reactions   Lyrica [pregabalin] Shortness Of Breath   Chest Pain    Bactrim [sulfamethoxazole-trimethoprim] Rash   Prednisone Palpitations, Hypertension            Medication List     TAKE these medications    aspirin EC 81 MG tablet Take 81 mg by mouth daily.   atorvastatin 80 MG tablet Commonly known as: LIPITOR Take 1 tablet (80 mg total) by mouth daily at 6 PM.   budesonide-formoterol 80-4.5 MCG/ACT inhaler Commonly known as: Symbicort Take 2 puffs first thing in am and then another 2 puffs about 12 hours later.   busPIRone 7.5 MG tablet Commonly known as: BUSPAR Take 15 mg by mouth 3 (three) times daily.   cefdinir 300 MG capsule Commonly known as: OMNICEF Take 1 capsule (300 mg total) by mouth 2 (two) times daily.   cholecalciferol 25 MCG (1000 UNIT) tablet Commonly known as: VITAMIN D3 Take 1,000 Units by mouth daily.   doxycycline 100 MG tablet Commonly known as: VIBRA-TABS Take 1 tablet (100 mg total) by mouth 2 (two) times daily.   DULoxetine 60 MG capsule Commonly known as: CYMBALTA Take 60 mg by mouth daily. Take with 30 mg to equal 90 mg   DULoxetine 30 MG capsule Commonly known as: CYMBALTA Take 30 mg by mouth daily. Take with 60 mg to equal 90 mg   FeroSul 325 (65 Fe) MG tablet Generic drug: ferrous sulfate Take 325 mg by mouth every other day.   fexofenadine 180 MG tablet Commonly known as: ALLEGRA Take 180 mg by mouth daily.   fluticasone 50 MCG/ACT nasal spray Commonly known as: FLONASE Place 2 sprays into both nostrils daily as needed for allergies.   furosemide 40 MG tablet Commonly known as: Lasix Take 1 tablet (40 mg total) by mouth daily. What changed:  medication strength how much to take   gabapentin 300 MG capsule Commonly known as: NEURONTIN Take 900 mg by mouth 3 (three) times daily.   isosorbide mononitrate 30 MG 24 hr tablet Commonly known as: IMDUR TAKE ONE TABLET BY MOUTH ONCE DAILY   metFORMIN 500 MG tablet Commonly known as: GLUCOPHAGE Take 500 mg by mouth daily with breakfast.   metoprolol tartrate 25 MG tablet Commonly known as: LOPRESSOR Take 1 tablet (25 mg total)  by mouth 2 (two) times daily. What changed: when to take this   metroNIDAZOLE 0.75 % cream Commonly known as: METROCREAM Apply 1 Application topically 2 (two) times daily.   nitroGLYCERIN 0.4 MG SL tablet Commonly known as: NITROSTAT Place 1 tablet (0.4 mg total) under the tongue every 5 (five) minutes as needed for chest pain.   omeprazole 20 MG capsule Commonly known as: PRILOSEC Take 20 mg by mouth daily.   oxyCODONE-acetaminophen 10-325 MG tablet Commonly known as: PERCOCET Take 1 tablet by mouth every 8 (eight) hours as needed for pain.   tiZANidine 4 MG tablet Commonly known as: ZANAFLEX Take 4 mg by mouth 3 (three) times daily.        Follow-up Information     [facility] Follow up.   Specialty: Home Health Services Why: Home health will follow up with  you to start service for CHF. Contact information: [address] [place] [address] [phone]         Orpah Cobb P, DO Follow up.   Specialty: Family Medicine Contact information: [address] [place] [address] [phone]                Discharge Exam: Ceasar Mons Weights   09/11/23 0500 09/12/23 0435 09/13/23 0545  Weight: 82.5 kg 81.7 kg 79.4 kg  BP 131/82 (BP Location: Left Arm)   Pulse 75   Temp 97.6 F (36.4 C) (Oral)   Resp 20   Ht 5\' 2"  (1.575 m)   Wt 79.4 kg   SpO2 93%   BMI 32.02 kg/m   Well-appearing female in no distress Clear, nonlabored RRR, trace pitting edema (much improved)  Condition at discharge: stable  The results of significant diagnostics from this hospitalization (including imaging, microbiology, ancillary and laboratory) are listed below for reference.   Imaging Studies: NM Pulmonary Perfusion  Result Date: 09/12/2023 CLINICAL DATA:  Pulmonary hypertension shortness of breath EXAM: NUCLEAR MEDICINE PERFUSION LUNG SCAN TECHNIQUE: Perfusion images were obtained in multiple projections after intravenous injection of  radiopharmaceutical. RADIOPHARMACEUTICALS:  4.3 mCi Tc-68m MAA IV COMPARISON:  Chest x-ray 09/10/2023, CT chest 09/10/2023 FINDINGS: No significant wedge-shaped or other perfusion defects are visualized. There is homogeneous perfusion to the lungs bilaterally. IMPRESSION: Negative examination Electronically Signed   By: Jasmine Pang M.D.   On: 09/12/2023 18:00   ECHOCARDIOGRAM COMPLETE  Result Date: 09/11/2023    ECHOCARDIOGRAM REPORT   Patient Name:   ALLYSSA WAYMENT Marquart Date of Exam: 09/11/2023 Medical Rec #:  [id number]    Height:       62.0 in Accession #:    [id number]   Weight:       181.9 lb Date of Birth:  1971/05/21     BSA:          1.836 m Patient Age:    52 years     BP:           111/74 mmHg Patient Gender: F            HR:           60 bpm. [facility] Procedure: 2D Echo, Color Doppler and Cardiac Doppler Indications:    acute diastolic chf  History:        Patient has prior history of Echocardiogram examinations, most                 recent 07/11/2022. CAD, COPD, Signs/Symptoms:Dyspnea; Risk                 Factors:Hypertension and Current Smoker.  Sonographer:    Delcie Roch RDCS Referring Phys: [id number] [address] [place] IMPRESSIONS  1. Left ventricular ejection fraction, by estimation, is 55 to 60%. The left ventricle has normal function. The left ventricle has no regional wall motion abnormalities. Left ventricular diastolic parameters were normal.  2. Right ventricular systolic function is moderately reduced. The right ventricular size is moderately enlarged. There is severely elevated pulmonary artery systolic pressure. The estimated right ventricular systolic pressure is 74.6 mmHg. RV free wall is hypokinetic and RV apex is hyperkinetic.  3. The mitral valve is normal in structure. No evidence of mitral valve regurgitation. No evidence of mitral stenosis.  4. The aortic valve is tricuspid. Aortic valve regurgitation is not visualized. No aortic stenosis is present.  5. The  inferior  vena cava is dilated in size with <50% respiratory variability, suggesting right atrial pressure of 15 mmHg. Comparison(s): No significant change from prior study. FINDINGS  Left Ventricle: Left ventricular ejection fraction, by estimation, is 55 to 60%. The left ventricle has normal function. The left ventricle has no regional wall motion abnormalities. The left ventricular internal cavity size was normal in size. There is  no left ventricular hypertrophy. Left ventricular diastolic parameters were normal. Right Ventricle: The right ventricular size is moderately enlarged. No increase in right ventricular wall thickness. Right ventricular systolic function is moderately reduced. There is severely elevated pulmonary artery systolic pressure. The tricuspid regurgitant velocity is 3.86 m/s, and with an assumed right atrial pressure of 15 mmHg, the estimated right ventricular systolic pressure is 74.6 mmHg. Left Atrium: Left atrial size was normal in size. Right Atrium: Right atrial size was normal in size. Pericardium: There is no evidence of pericardial effusion. Mitral Valve: The mitral valve is normal in structure. No evidence of mitral valve regurgitation. No evidence of mitral valve stenosis. Tricuspid Valve: The tricuspid valve is normal in structure. Tricuspid valve regurgitation is trivial. No evidence of tricuspid stenosis. Aortic Valve: The aortic valve is tricuspid. Aortic valve regurgitation is not visualized. No aortic stenosis is present. Pulmonic Valve: The pulmonic valve was normal in structure. Pulmonic valve regurgitation is trivial. No evidence of pulmonic stenosis. Aorta: The aortic root and ascending aorta are structurally normal, with no evidence of dilitation. Venous: The inferior vena cava is dilated in size with less than 50% respiratory variability, suggesting right atrial pressure of 15 mmHg. IAS/Shunts: No atrial level shunt detected by color flow Doppler.  LEFT VENTRICLE PLAX 2D  LVIDd:         3.70 cm   Diastology LVIDs:         2.60 cm   LV e' medial:    5.98 cm/s LV PW:         1.00 cm   LV E/e' medial:  13.8 LV IVS:        1.00 cm   LV e' lateral:   11.30 cm/s LVOT diam:     2.00 cm   LV E/e' lateral: 7.3 LV SV:         60 LV SV Index:   33 LVOT Area:     3.14 cm  RIGHT VENTRICLE             IVC RV Basal diam:  2.80 cm     IVC diam: 2.20 cm RV S prime:     10.70 cm/s TAPSE (M-mode): 1.5 cm LEFT ATRIUM           Index        RIGHT ATRIUM           Index LA diam:      3.70 cm 2.02 cm/m   RA Area:     15.60 cm LA Vol (A4C): 36.4 ml 19.83 ml/m  RA Volume:   36.00 ml  19.61 ml/m  AORTIC VALVE LVOT Vmax:   93.80 cm/s LVOT Vmean:  57.800 cm/s LVOT VTI:    0.190 m  AORTA Ao Root diam: 3.00 cm Ao Asc diam:  3.00 cm MITRAL VALVE               TRICUSPID VALVE MV Area (PHT): 3.42 cm    TR Peak grad:   59.6 mmHg MV Decel Time: 222 msec    TR Vmax:  386.00 cm/s MV E velocity: 82.30 cm/s MV A velocity: 60.40 cm/s  SHUNTS MV E/A ratio:  1.36        Systemic VTI:  0.19 m                            Systemic Diam: 2.00 cm Vishnu Priya Mallipeddi Electronically signed by Winfield Rast Mallipeddi Signature Date/Time: 09/11/2023/5:41:24 PM    Final    CT Angio Chest Pulmonary Embolism (PE) W or WO Contrast  Result Date: 09/10/2023 CLINICAL DATA:  Pulmonary embolism (PE) suspected, low to intermediate prob, positive D-dimer. Cough with shortness of breath and congestion. EXAM: CT ANGIOGRAPHY CHEST WITH CONTRAST TECHNIQUE: Multidetector CT imaging of the chest was performed using the standard protocol during bolus administration of intravenous contrast. Multiplanar CT image reconstructions and MIPs were obtained to evaluate the vascular anatomy. RADIATION DOSE REDUCTION: This exam was performed according to the departmental dose-optimization program which includes automated exposure control, adjustment of the mA and/or kV according to patient size and/or use of iterative reconstruction  technique. CONTRAST:  75mL OMNIPAQUE IOHEXOL 350 MG/ML SOLN COMPARISON:  None Available. FINDINGS: Cardiovascular: No evidence of embolism to the proximal subsegmental pulmonary artery level. Normal cardiac size. No pericardial effusion. No aortic aneurysm. There are coronary artery calcifications, in keeping with coronary artery disease. There are also mild peripheral atherosclerotic vascular calcifications of thoracic aorta and its major branches. Mediastinum/Nodes: Visualized thyroid gland appears grossly unremarkable. No solid / cystic mediastinal masses. The esophagus is nondistended precluding optimal assessment. Redemonstration of multiple enlarged mediastinal and hilar lymph nodes for example, right lower paratracheal lymph node measuring up to 15 x 23 mm. Prevascular lymph node measuring up to 20 x 28 mm. These are grossly similar to the prior study. These lymph nodes were non FDG avid on the prior PET-CT scan from 07/31/2022. No axillary lymphadenopathy by size criteria. Lungs/Pleura: There is trace right pleural effusion. There are ground-glass changes throughout bilateral lungs with associated smooth interlobular septal thickening, compatible with crazy paving pattern, which is nonspecific. Differential diagnosis includes pulmonary edema, ARDS, acute interstitial pneumonia, pulmonary alveolar proteinosis, etc. There is also mosaic attenuation of lungs, consistent with heterogeneous air trapping related to small airways disease. No mass or consolidation. No left pleural effusion. No pneumothorax. No suspicious lung nodules. Upper Abdomen: Visualized upper abdominal viscera within normal limits. Musculoskeletal: The visualized soft tissues of the chest wall are grossly unremarkable. A neurostimulator device is noted with its tip along the posterior aspect of the spinal canal at T7-8 level. No suspicious osseous lesions. There are mild multilevel degenerative changes in the visualized spine. Review of the  MIP images confirms the above findings. IMPRESSION: 1. No evidence of embolism to the proximal subsegmental pulmonary artery level. 2. Crazy paving pattern of bilateral lungs. Differential diagnosis includes pulmonary edema, ARDS, acute interstitial pneumonia, pulmonary alveolar proteinosis, etc. 3. Mosaic attenuation of bilateral lungs, consistent with heterogeneous air trapping related to small airway disease. 4. Small right pleural effusion. 5. Persistent moderate mediastinal and hilar lymphadenopathy, of indeterminate age but likely benign/reactive in the given clinical settings. 6. Multiple other nonacute observations, as described above. Aortic Atherosclerosis (ICD10-I70.0). Electronically Signed   By: Jules Schick M.D.   On: 09/10/2023 16:19   DG Chest 2 View  Result Date: 09/10/2023 CLINICAL DATA:  52 year old female with shortness of breath on exertion. Weakness for 3 weeks. Smoker. Interstitial lung disease. EXAM: CHEST - 2 VIEW COMPARISON:  Chest  CT 01/29/2023 and earlier. FINDINGS: PA and lateral views 1134 hours. Thoracic spinal stimulator device is chronic. Stable lung volumes. Mediastinal contours remain within normal limits. Visualized tracheal air column is within normal limits. Widespread chronic coarse pulmonary interstitium, with chronic widespread subpleural scarring and interstitial thickening by CT this year. No pneumothorax, pulmonary edema, pleural effusion or confluent lung opacity. No acute osseous abnormality identified. Negative visible bowel gas. IMPRESSION: Chronic interstitial lung disease. No acute cardiopulmonary abnormality. Electronically Signed   By: Odessa Fleming M.D.   On: 09/10/2023 12:45    Microbiology: Results for orders placed or performed during the hospital encounter of 09/10/23  Resp panel by RT-PCR (RSV, Flu A&B, Covid) Anterior Nasal Swab     Status: None   Collection Time: 09/10/23 11:25 AM   Specimen: Anterior Nasal Swab  Result Value Ref Range Status    SARS Coronavirus 2 by RT PCR NEGATIVE NEGATIVE Final    Comment: (NOTE) SARS-CoV-2 target nucleic acids are NOT DETECTED.  The SARS-CoV-2 RNA is generally detectable in upper respiratory specimens during the acute phase of infection. The lowest concentration of SARS-CoV-2 viral copies this assay can detect is 138 copies/mL. A negative result does not preclude SARS-Cov-2 infection and should not be used as the sole basis for treatment or other patient management decisions. A negative result may occur with  improper specimen collection/handling, submission of specimen other than nasopharyngeal swab, presence of viral mutation(s) within the areas targeted by this assay, and inadequate number of viral copies(<138 copies/mL). A negative result must be combined with clinical observations, patient history, and epidemiological information. The expected result is Negative.  Fact Sheet for Patients:  BloggerCourse.com  Fact Sheet for Healthcare Providers:  SeriousBroker.it  This test is no t yet approved or cleared by the [place] FDA and  has been authorized for detection and/or diagnosis of SARS-CoV-2 by FDA under an Emergency Use Authorization (EUA). This EUA will remain  in effect (meaning this test can be used) for the duration of the COVID-19 declaration under Section 564(b)(1) of the Act, 21 U.S.C.section 360bbb-3(b)(1), unless the authorization is terminated  or revoked sooner.       Influenza A by PCR NEGATIVE NEGATIVE Final   Influenza B by PCR NEGATIVE NEGATIVE Final    Comment: (NOTE) The Xpert Xpress SARS-CoV-2/FLU/RSV plus assay is intended as an aid in the diagnosis of influenza from Nasopharyngeal swab specimens and should not be used as a sole basis for treatment. Nasal washings and aspirates are unacceptable for Xpert Xpress SARS-CoV-2/FLU/RSV testing.  Fact Sheet for  Patients: BloggerCourse.com  Fact Sheet for Healthcare Providers: SeriousBroker.it  This test is not yet approved or cleared by the [place] FDA and has been authorized for detection and/or diagnosis of SARS-CoV-2 by FDA under an Emergency Use Authorization (EUA). This EUA will remain in effect (meaning this test can be used) for the duration of the COVID-19 declaration under Section 564(b)(1) of the Act, 21 U.S.C. section 360bbb-3(b)(1), unless the authorization is terminated or revoked.     Resp Syncytial Virus by PCR NEGATIVE NEGATIVE Final    Comment: (NOTE) Fact Sheet for Patients: BloggerCourse.com  Fact Sheet for Healthcare Providers: SeriousBroker.it  This test is not yet approved or cleared by the [place] FDA and has been authorized for detection and/or diagnosis of SARS-CoV-2 by FDA under an Emergency Use Authorization (EUA). This EUA will remain in effect (meaning this test can be used) for the duration of the COVID-19 declaration under Section 564(b)(1)  of the Act, 21 U.S.C. section 360bbb-3(b)(1), unless the authorization is terminated or revoked.  Performed at [facility], [address] [place], [place] [address]     Labs: CBC: Recent Labs  Lab 09/10/23 1204 09/11/23 0409  WBC 9.4 6.2  NEUTROABS 7.3  --   HGB 11.4* 12.3  HCT 36.5 39.9  MCV 90.1 90.5  PLT 267 254   Basic Metabolic Panel: Recent Labs  Lab 09/10/23 1204 09/11/23 0409 09/12/23 0740 09/13/23 0418  NA 134* 139 142 142  K 4.2 3.9 4.8 3.0*  CL 100 101 100 101  CO2 23 26 31 30   GLUCOSE 117* 107* 95 82  BUN 13 14 15 17   CREATININE 0.86 0.82 0.84 0.72  CALCIUM 8.5* 9.1 9.1 9.0   Liver Function Tests: Recent Labs  Lab 09/10/23 1204  AST 23  ALT 31  ALKPHOS 93  BILITOT 0.8  PROT 6.6  ALBUMIN 3.0*   CBG: Recent Labs  Lab 09/12/23 0720 09/12/23 1115 09/12/23 1553  09/12/23 2031 09/13/23 0755  GLUCAP 80 91 103* 115* 85    Discharge time spent: greater than 30 minutes.  Signed: Tyrone Nine, MD [facility] 09/13/2023

## 2023-09-25 ENCOUNTER — Encounter: Payer: Self-pay | Admitting: Cardiology

## 2023-09-25 ENCOUNTER — Ambulatory Visit: Payer: Medicare Other | Attending: Cardiology | Admitting: Cardiology

## 2023-09-25 VITALS — BP 96/64 | HR 88 | Ht 62.5 in | Wt 174.0 lb

## 2023-09-25 DIAGNOSIS — I272 Pulmonary hypertension, unspecified: Secondary | ICD-10-CM | POA: Diagnosis not present

## 2023-09-25 DIAGNOSIS — I10 Essential (primary) hypertension: Secondary | ICD-10-CM

## 2023-09-25 DIAGNOSIS — E782 Mixed hyperlipidemia: Secondary | ICD-10-CM

## 2023-09-25 DIAGNOSIS — I5032 Chronic diastolic (congestive) heart failure: Secondary | ICD-10-CM | POA: Diagnosis not present

## 2023-09-25 DIAGNOSIS — I50810 Right heart failure, unspecified: Secondary | ICD-10-CM

## 2023-09-25 DIAGNOSIS — I25118 Atherosclerotic heart disease of native coronary artery with other forms of angina pectoris: Secondary | ICD-10-CM

## 2023-09-25 DIAGNOSIS — R079 Chest pain, unspecified: Secondary | ICD-10-CM

## 2023-09-25 NOTE — Progress Notes (Signed)
Clinical Summary Ms. Santana is a 52 y.o.female seen today for follow up of the following medical problems.     1. CAD - history of NSTEMI Jan 2016, s/p DES to mid RCA and DES to distal RCA and DES to mid LAD.   - 04/2015 echo LVEF 55-60% -11/2015 DSE without ischemia - 05/2016 nuclear stress [place] no clear ischemia, limited by signicant gut radiotrace uptake   11/2018 nuclear stress: no ischemia      Jan 2022 echo: LVEF 65-70%, no WMAs, normal diastolic, normal RV - no chest pains, chronic SOB up and down. On home O2 2L   - pain right ear into jaw, right side of chest. Not positinal. Unsure of any other associated symptoms. About 1-2 times per month.  - can occur at rest or with exertion. Reports prior jaw pain with her angina in the past.      2. COPD on home O2 3L - followed by Dr Sherene Sires Some recent SOB, recently diagnosed with emphysema.  12/2021. DOE with activities, occasional cough/wheezing. CT chest did show some emphysema. Will use prn albuterol with benefit.  09/2022 PFTs: severe diffusion defect - breathing much improve breztri      3. HTN -she is compliant with meds   4. Hyperlipidemia 07/2017 TC 103 TG 106 HDL 39 LDL 45 -- she is compliant with statin   - 12/2020 TG 15 TC 92 HDL 31 LDL 39 - labs followed by pcp  06/2023 TC 87 TG 87 HDL 33 LDL 37   5. OSA - followed by Dr Frances Furbish neurology - she remains compliant with CPAP   6. Chronic back pain - prior nerve stimulator surgery     7. DM2 - followed by pcp   8. HFpEF  - 06/2022 echo: LVEF 65-70%, mild to mod RV dysfunction, diastolic not described  - no recent edema - chronic SOB on home O2 for COPD  - admit 08/2023 with volume overload, BNP 1340. Up 7 lbs from her baseline - 08/2023 echo: LVEF 55-60%, normal diastolic fxn, mod RV dysfunction and mod RVE, severe pulm HTN PASP 75 - no recent edema. Home weight 168 lbs and stable .   9. Pulmonary HTN - 06/2022 echo: mild RVE, mild to mod RV  dysfunction      PASP 34 -  08/2023 echo: LVEF 55-60%, normal diastolic fxn, mod RV dysfunction and mod RVE, severe pulm HTN PASP 75 - 08/2023 CT PE no PE. paving pattern of bilateral lungs. Differential diagnosis includes pulmonary edema, ARDS, acute interstitial pneumonia, pulmonary alveolar proteinosis - 08/2023 VQ: negative - known history of HFpEF, OSA, COPD. 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   Past Medical History:  Diagnosis Date   Arthritis    Bilateral hand pain    CAD (coronary artery disease)    a. s/p NSTEMI in 2016 with DES to mid-RCA, DES to [facility] and DES to mid-LAD b. low-risk NST in 05/2016 and 11/2018   Cervical radiculopathy    Chronic neck and back pain    Emphysema lung (HCC)    Fibromyalgia    Hypertension    Kidney stones    hx of w previous lithotripsy   MI (myocardial infarction) (HCC)    Pain management    [facility]   Thoracic back pain      Allergies  Allergen Reactions   Lyrica [Pregabalin] Shortness Of Breath    Chest Pain    Bactrim [Sulfamethoxazole-Trimethoprim] Rash  Prednisone Palpitations and Hypertension          Current Outpatient Medications  Medication Sig Dispense Refill   aspirin EC 81 MG tablet Take 81 mg by mouth daily.     atorvastatin (LIPITOR) 80 MG tablet Take 1 tablet (80 mg total) by mouth daily at 6 PM. 30 tablet 11   budesonide-formoterol (SYMBICORT) 80-4.5 MCG/ACT inhaler Take 2 puffs first thing in am and then another 2 puffs about 12 hours later. 1 each 11   busPIRone (BUSPAR) 7.5 MG tablet Take 15 mg by mouth 3 (three) times daily.     cefdinir (OMNICEF) 300 MG capsule Take 1 capsule (300 mg total) by mouth 2 (two) times daily. 4 capsule 0   cholecalciferol (VITAMIN D3) 25 MCG (1000 UNIT) tablet Take 1,000 Units by mouth daily.     doxycycline (VIBRA-TABS) 100 MG tablet Take 1 tablet (100 mg total) by mouth 2 (two) times daily. 4 tablet 0   DULoxetine (CYMBALTA) 30 MG capsule Take 30 mg by mouth  daily. Take with 60 mg to equal 90 mg     DULoxetine (CYMBALTA) 60 MG capsule Take 60 mg by mouth daily. Take with 30 mg to equal 90 mg     FEROSUL 325 (65 Fe) MG tablet Take 325 mg by mouth every other day.     fexofenadine (ALLEGRA) 180 MG tablet Take 180 mg by mouth daily.     fluticasone (FLONASE) 50 MCG/ACT nasal spray Place 2 sprays into both nostrils daily as needed for allergies.      furosemide (LASIX) 40 MG tablet Take 1 tablet (40 mg total) by mouth daily. 30 tablet 0   gabapentin (NEURONTIN) 300 MG capsule Take 900 mg by mouth 3 (three) times daily.     isosorbide mononitrate (IMDUR) 30 MG 24 hr tablet TAKE ONE TABLET BY MOUTH ONCE DAILY 90 tablet 0   metFORMIN (GLUCOPHAGE) 500 MG tablet Take 500 mg by mouth daily with breakfast.     metoprolol tartrate (LOPRESSOR) 25 MG tablet Take 1 tablet (25 mg total) by mouth 2 (two) times daily. (Patient taking differently: Take 25 mg by mouth daily.) 180 tablet 3   metroNIDAZOLE (METROCREAM) 0.75 % cream Apply 1 Application topically 2 (two) times daily.     nitroGLYCERIN (NITROSTAT) 0.4 MG SL tablet Place 1 tablet (0.4 mg total) under the tongue every 5 (five) minutes as needed for chest pain. 25 tablet 3   omeprazole (PRILOSEC) 20 MG capsule Take 20 mg by mouth daily.     oxyCODONE-acetaminophen (PERCOCET) 10-325 MG per tablet Take 1 tablet by mouth every 8 (eight) hours as needed for pain.     tiZANidine (ZANAFLEX) 4 MG tablet Take 4 mg by mouth 3 (three) times daily.     No current facility-administered medications for this visit.     Past Surgical History:  Procedure Laterality Date   CARDIAC SURGERY     CYSTOSCOPY WITH RETROGRADE PYELOGRAM, URETEROSCOPY AND STENT PLACEMENT Left 03/14/2015   Procedure: CYSTOSCOPY WITH LEFT RETROGRADE PYELOGRAM;  Surgeon: Alfredo Martinez, MD;  Location: [facility];  Service: Urology;  Laterality: Left;   CYSTOSCOPY WITH RETROGRADE PYELOGRAM, URETEROSCOPY AND STENT PLACEMENT Left 03/20/2015   Procedure:  CYSTOSCOPY WITH LEFT  RETROGRADE LEFT URETEROSCOPY AND LEFT  STENT ;  Surgeon: Bjorn Pippin, MD;  Location: [facility];  Service: Urology;  Laterality: Left;   HOLMIUM LASER APPLICATION Left 03/20/2015   Procedure: HOLMIUM LASER APPLICATION;  Surgeon: Bjorn Pippin, MD;  Location: [facility];  Service: Urology;  Laterality: Left;   LEFT HEART CATHETERIZATION WITH CORONARY ANGIOGRAM N/A 10/25/2014   Procedure: LEFT HEART CATHETERIZATION WITH CORONARY ANGIOGRAM;  Surgeon: Kathleene Hazel, MD;  Location: [facility] CATH [facility];  Service: Cardiovascular;  Laterality: N/A;   LITHOTRIPSY  about 2011   SPINAL FUSION     L5-S1   TUBAL LIGATION       Allergies  Allergen Reactions   Lyrica [Pregabalin] Shortness Of Breath    Chest Pain    Bactrim [Sulfamethoxazole-Trimethoprim] Rash   Prednisone Palpitations and Hypertension           Family History  Problem Relation Age of Onset   Diabetes Mother    Stroke Mother    Seizures Other    Cancer Other    Diabetes Other      Social History Ms. Nembhard reports that she has been smoking cigarettes. She started smoking about 32 years ago. She has a 24.7 pack-year smoking history. She has never used smokeless tobacco. Ms. Hout reports no history of alcohol use.   Review of Systems CONSTITUTIONAL: No weight loss, fever, chills, weakness or fatigue.  HEENT: Eyes: No visual loss, blurred vision, double vision or yellow sclerae.No hearing loss, sneezing, congestion, runny nose or sore throat.  SKIN: No rash or itching.  CARDIOVASCULAR: per hpi RESPIRATORY: per hpi GASTROINTESTINAL: No anorexia, nausea, vomiting or diarrhea. No abdominal pain or blood.  GENITOURINARY: No burning on urination, no polyuria NEUROLOGICAL: No headache, dizziness, syncope, paralysis, ataxia, numbness or tingling in the extremities. No change in bowel or bladder control.  MUSCULOSKELETAL: No muscle, back pain, joint pain or stiffness.  LYMPHATICS: No enlarged nodes. No history of  splenectomy.  PSYCHIATRIC: No history of depression or anxiety.  ENDOCRINOLOGIC: No reports of sweating, cold or heat intolerance. No polyuria or polydipsia.  Marland Kitchen   Physical Examination Today's Vitals   09/25/23 1511  BP: 96/64  Pulse: 88  SpO2: 98%  Weight: 174 lb (78.9 kg)  Height: 5' 2.5" (1.588 m)   Body mass index is 31.32 kg/m.  Gen: resting comfortably, no acute distress HEENT: no scleral icterus, pupils equal round and reactive, no palptable cervical adenopathy,  CV: RRR, no m/r,g no jvd Resp: Clear to auscultation bilaterally GI: abdomen is soft, non-tender, non-distended, normal bowel sounds, no hepatosplenomegaly MSK: extremities are warm, no edema.  Skin: warm, no rash Neuro:  no focal deficits Psych: appropriate affect   Diagnostic Studies  Jan 2016 Cath PCI Note: She was given an additional 5000 units IV heparin x 1. ACT was over 200. She was given Brilinta 180 mg po x 1.   Lesion # 1 Distal RCA: JR4 guiding catheter used to engage the RCA. Cougar IC wire down the RCA. 2.0 x 15 mm balloon x 1 for pre-dilatation. 2.25 x 16 mm Promus Premier DES x 1 distal RCA post-dilated with a 2.5 x 12 mm Benedict balloon x 1. Stenosis taken from 90% down to 0%.   Lesion # 2 mid RCA: 2.0 x 15 mm balloon x 1. 3.0 x 12 mm Promus Premier DES x 1. The mid stenosis was followed by an aneurysmal segment with a size mismatch before the distal vessel so stents were not overlapped. Post-dilatation with a 3.5 x 8 mm [place] balloon x 1. Stenosis taken from 95% down to 0%.   Lesion #3 mid LAD: XB LAD 3.5 guiding catheter to engage left main. 2.5 x 12 mm balloon x 1 for pre-dilatation. 3.5 x 16 mm [employer]  DES x 1 mid LAD. Stent post-dilated with a 3.5 x 8 mm [place] balloon x 2. Stenosis taken from 80% down to 0%.   The sheath was removed from the right radial artery and a Terumo hemostasis band was applied at the arteriotomy site on the right wrist. There were no immediate complications. The patient  was taken to the recovery area in stable condition.   Hemodynamic Findings: Central aortic pressure: 115/66 Left ventricular pressure: 121/3/14   Angiographic Findings:   Left main: No obstructive disease.     Left Anterior Descending Artery: Large caliber vessel that courses to the apex. 80% mid stenosis. Small caliber diagonal Lolly Glaus. The distal LAD tapers to a small caliber vessel.     Circumflex Artery: Large caliber vessel with 20% proximal stenosis.     Right Coronary Artery: Large dominant vessel with diffuse 30% proximal stenosis, 95% mid stenosis followed by an aneurysmal segment. Distal 90% stenosis. Diffuse mild plaque in the posterolateral artery and PDA.   Left Ventricular Angiogram: LVEF=55-60%.   Impression: 1. Severe double vessel CAD 2. NSTEMI 3. Normal LV systolic function 4. Successful PTCA/DES x 1 mid RCA and PTCA/DES x 1 distal RCA (stents not overlapped due to size mismatch in the vessel and the presence of an aneurysmal segment in the distal segment of the mid vessel) 5. Successful PTCA/DES x 1 mid LAD   Recommendations: Will need dual anti-platelet therapy with ASA and Brilinta for at least one year. Continue beta blocker and statin. Tobacco cessation.          04/2015 echo Study Conclusions  - Left ventricle: The cavity size was normal. Wall thickness was   increased in a pattern of mild LVH. Systolic function was normal.   The estimated ejection fraction was in the range of 55% to 60%.   Wall motion was normal; there were no regional wall motion   abnormalities. Left ventricular diastolic function parameters   were normal. - Aortic valve: Mildly calcified annulus. Trileaflet; mildly   thickened leaflets. Valve area (VTI): 2.07 cm^2. Valve area   (Vmax): 2.2 cm^2. - Atrial septum: No defect or patent foramen ovale was identified. - Technically adequate study.   11/2015 DSE ------------------------------------------------------------------- Stress  echo results:     Left ventricular ejection fraction was normal at rest and with stress. Normal echo stress   05/2016 Nuclear stress [place] Severe gut uptake, limited study. No clear ischemia       11/2018 nuclear stress   There was no ST segment deviation noted during stress. The study is normal. There are no perfusion defects consistent with prior infarct or current ischemia This is a low risk study. The left ventricular ejection fraction is normal (55-65%).   Jan 2022 echo   IMPRESSIONS     1. Left ventricular ejection fraction, by estimation, is 65 to 70%. The  left ventricle has normal function. The left ventricle has no regional  wall motion abnormalities. There is mild left ventricular hypertrophy.  Left ventricular diastolic parameters  were normal.   2. Right ventricular systolic function is normal. The right ventricular  size is normal. Tricuspid regurgitation signal is inadequate for assessing  PA pressure.   3. The mitral valve is grossly normal. Trivial mitral valve  regurgitation.   4. The aortic valve is tricuspid. Aortic valve regurgitation is trivial.   5. The inferior vena cava is normal in size with greater than 50%  respiratory variability, suggesting right atrial pressure of 3 mmHg.  09/2022 PFTs severe diffusion defect     Assessment and Plan  1.CAD - recent chest pains, somewhat atypical but involve some associated jaw pain which she also had during her prior angina - plan for lexiscan, History of COPD but no active wheezing     2. HTN -controlled, continue current meds   3. Hyperlipidemia -well controlled, continue current meds   4. Chronic HFpEF -euvoelmic today, home weight of 168 lbs looks to be her dry weight, her home scale differs from ours. We will follow her home weights - continue current diuretic  5. Pulmonary HTN - likely group II and III pulmonary HTN - signifcant rise in PASP by echo from prior study, this was in the  setting of decompensated HF. Repeat echo now that she is euvolemic reassess pulmonary pressures - VQ was negative      Antoine Poche, M.D.

## 2023-09-25 NOTE — Patient Instructions (Signed)
Medication Instructions:  Your physician recommends that you continue on your current medications as directed. Please refer to the Current Medication list given to you today.  *If you need a refill on your cardiac medications before your next appointment, please call your pharmacy*   Lab Work: BMET MAG  If you have labs (blood work) drawn today and your tests are completely normal, you will receive your results only by: MyChart Message (if you have MyChart) OR A paper copy in the mail If you have any lab test that is abnormal or we need to change your treatment, we will call you to review the results.   Testing/Procedures: Your physician has requested that you have an echocardiogram. Echocardiography is a painless test that uses sound waves to create images of your heart. It provides your doctor with information about the size and shape of your heart and how well your heart's chambers and valves are working. This procedure takes approximately one hour. There are no restrictions for this procedure. Please do NOT wear cologne, perfume, aftershave, or lotions (deodorant is allowed). Please arrive 15 minutes prior to your appointment time.  Please note: We ask at that you not bring children with you during ultrasound (echo/ vascular) testing. Due to room size and safety concerns, children are not allowed in the ultrasound rooms during exams. Our front office staff cannot provide observation of children in our lobby area while testing is being conducted. An adult accompanying a patient to their appointment will only be allowed in the ultrasound room at the discretion of the ultrasound technician under special circumstances. We apologize for any inconvenience.  Your physician has requested that you have a lexiscan myoview. For further information please visit [email or web address] Please follow instruction sheet, as given.    Follow-Up: At [facility], you and your health needs are our  priority.  As part of our continuing mission to provide you with exceptional heart care, we have created designated Provider Care Teams.  These Care Teams include your primary Cardiologist (physician) and Advanced Practice Providers (APPs -  Physician Assistants and Nurse Practitioners) who all work together to provide you with the care you need, when you need it.  We recommend signing up for the patient portal called "MyChart".  Sign up information is provided on this After Visit Summary.  MyChart is used to connect with patients for Virtual Visits (Telemedicine).  Patients are able to view lab/test results, encounter notes, upcoming appointments, etc.  Non-urgent messages can be sent to your provider as well.   To learn more about what you can do with MyChart, go to [email or web address].    Your next appointment:   3 month(s)  Provider:   You may see Dina Rich, MD or one of the following Advanced Practice Providers on your designated Care Team:   Randall An, PA-C  Jacolyn Reedy, [place]     Other Instructions

## 2023-09-29 ENCOUNTER — Other Ambulatory Visit (HOSPITAL_COMMUNITY)
Admission: RE | Admit: 2023-09-29 | Discharge: 2023-09-29 | Disposition: A | Discharge status: Home / Self Care | Payer: Medicare Other | Source: Ambulatory Visit | Attending: Cardiology | Admitting: Cardiology

## 2023-09-29 DIAGNOSIS — I10 Essential (primary) hypertension: Secondary | ICD-10-CM | POA: Insufficient documentation

## 2023-09-29 LAB — BASIC METABOLIC PANEL
Anion gap: 12 (ref 5–15)
BUN: 6 mg/dL (ref 6–20)
CO2: 28 mmol/L (ref 22–32)
Calcium: 9.5 mg/dL (ref 8.9–10.3)
Chloride: 99 mmol/L (ref 98–111)
Creatinine, Ser: 0.63 mg/dL (ref 0.44–1.00)
GFR, Estimated: >60 mL/min (ref >60)
Glucose, Bld: 112 mg/dL — ABNORMAL HIGH (ref 70–99)
Potassium: 4 mmol/L (ref 3.5–5.1)
Sodium: 139 mmol/L (ref 135–145)

## 2023-09-29 LAB — MAGNESIUM: Magnesium: 1.7 mg/dL (ref 1.7–2.4)

## 2023-10-05 NOTE — Progress Notes (Unsigned)
Tanya Lowe, female    DOB: Nov 06, 1970    MRN: [id number]   Brief patient profile:  8 yowf active smoker with allergic rhinitis worse in spring//asthma periodically more need albuterol x 2013  referred to pulmonary clinic in [place]  08/12/2022 by Tanya Cobb DO  for copd eval  and proved to have COPD 0/ AB by pfts 09/29/22       History of Present Illness  08/12/2022  Pulmonary/ 1st office eval/ Tanya Lowe / [facility]  no maint rx Chief Complaint  Patient presents with   Consult    Consult for copd abnormal CT scan. Sent home from hospital on 2LO2 cont    Dyspnea:  with 02 2lpm doing better and able to do [employer]/ uses [facility] parking  Cough: periodically yellowish brown  Sleep: on cpap s 02 SABA use: very confused with instructions on how/when to use which one but neb helps the most 02 :  2lpm daytime but not adjusting  Rec My office will be contacting you by phone for referral for PFTs    Plan A = Automatic = Always=    Breztri Take 2 puffs first thing in am and then another 2 puffs about 12 hours later.   Work on inhaler technique:   Plan B = Backup (to supplement plan A, not to replace it) Only use your albuterol inhaler as a rescue medication Plan C = Crisis (instead of Plan B but only if Plan B stops working) - only use your albuterol nebulizer if you first try Plan B  Make sure you check your oxygen saturation  AT  your highest level of activity (not after you stop)   to be sure it stays over 90%  Plug 02 into your cpap @ 2lpm - call 02 supplier to get the right connection  The key is to stop smoking completely before smoking completely stops you!   09/29/2022  f/u ov/Tanya Lowe re: doe  maint on Breztri  /02  s airflow obst on PFTs 09/29/2022 p breztri prior Chief Complaint  Patient presents with   Follow-up    Review PFT today.  Doing well today   Dyspnea:  able to do  [employer] on 2lpm  Cough: none x  after am breztri Sleeping: cpap no 02  SABA use: none   02: 2lpm when walking but not checking sats  Covid status:   vax none/ infected x one  Rec Stop lisinopril and start valsartan 80 mg use one half daily  - ok to take whole pill if needed to keep systolic less than 120  Make sure you check your oxygen saturation  AT  your highest level of activity (not after you stop)   to be sure it stays over 90%      04/08/2023  f/u ov/[place] office/Tanya Lowe re:AB maint on breztri  Chief Complaint  Patient presents with   Follow-up    Pt f/u states that she is feeling bad today and feels like she may have a URI.   Dyspnea:  only sltly worse since uri onset  Cough: worse x one month with nasal congestion slt yellow  Sleeping: bed blocks/ cpap fine  SABA use: none even with flare  02: 2lpm prn but not noct and usually not needed  - LDSCT q April last done 2024  Rec Zpak For cough / congestion : mucinex dm 1200 mg every 12 hours as needed (over the counter)  Try change breztri to symbicort 80  Take 2 puffs first thing in am and then another 2 puffs about 12 hours later.  Work on inhaler technique:\\     Admit date:     09/10/2023  Discharge date: 09/13/23   Recommendations at discharge:  Follow up with cardiology after discharge for CHF hospitalization follow up. Increased lasix from 20mg  daily to 40mg  daily, will need repeat BMP and volume assessment.  Follow up with PCP in 1-2 weeks Follow up with pulmonology, Dr. Sherene Lowe for ongoing management of chronic respiratory failure, consider PETCT for evaluation of lymphadenopathy (see details below).   Discharge Diagnoses: Principal Problem:   Acute on chronic diastolic CHF (congestive heart failure) (HCC)   Obesity   Tobacco abuse   Essential hypertension   GERD (gastroesophageal reflux disease)   Type 2 diabetes mellitus (HCC)   CAD (coronary artery disease)   COPD GOLD 0 / active smoker/AB   Mediastinal lymphadenopathy   ILD (interstitial lung disease) (HCC)   Severe pulmonary hypertension  (HCC)   OSA on CPAP   Acute on chronic hypoxic respiratory failure (HCC)   Acute on chronic diastolic heart failure [facility])   Hospital Course: Tanya Lowe is a 52 y.o. female with a history of ICM, chronic HFpEF, COPD with chronic 2L O2-dependence, ongoing tobacco use, HTN, HLD, and obesity who presented to the ED on 09/10/2023 due to dyspnea having been sent from her PCP's office where she was hypoxemic. Work up including elevated BNP and pulmonary edema pattern on CTA chest was consistent with volume overload, so IV lasix was started, echocardiogram ordered, and the patient was admitted for acute on chronic hypoxic respiratory failure due to acute on chronic HFpEF.    Assessment and Plan: Acute on chronic hypoxic respiratory failure: CTA chest does have more broad DDx, though clinically not behaving like ARDS, no wheezing to suggest AECOPD, and concomitant weight gain/edema, elevated BNP, and improvement with diuresis thus far supports diagnosis of acute CHF. Also treating for PNA given clinical uncertainty.  - Weaned to 2L.  - Tx conditions below - Really needs to use IS and mobilize.    Acute on chronic HFpEF, HTN:  - Updated echo shows preserved LVEF, moderate RV enlargement and systolic dysfunction, suggests severe pulmonary HTN. Perfusion scan negative.  - Increase 20mg  lasix dosing to 40mg  and follow up soon after discharge.   - Monitor weights at home   Pneumonia: RSV, flu, covid PCRs negative.  - Complete 5 days abx.     Demand myocardial ischemia, hx CAD/NSTEMI: Mild and downtrending troponin elevation in setting of respiratory failure, no anginal complaints.  - Defer ischemic evaluation per cardiology. No regional abnormalities in wall motion on echo.  - Continue baseline ASA, statin, BB, prn NTG.    OSA:  - CPAP qHS   COPD:  - Continue controller medications and prns   Tobacco use:  - Cessation counseling provided   GERD:  - PPI   Lymphadenopathy:  -She has  abnormally enlarged lymph nodes in the mediastinum and both hilar regions with possible increased size concerning for active inflammatory or neoplastic process -Recommending following up with a PET CT scan and tissue sampling this can be done in outpatient setting   T2DM: HbA1c 6.3%.     Obesity: Body mass index is 32.94 kg/m.    Chronic back pain:  - Continue home Tx.     GERD:  - PPI    10/06/2023  f/u ov/[place] office/Tanya Lowe re: COPD 0/AB maint on symbicort 80  Take 2 puffs first thing in am and then another 2 puffs about 12 hours later/still smoking  Chief Complaint  Patient presents with   COPD   Shortness of Breath  Dyspnea:  has not been back walking at  [employer] yet  Cough: clear mucus only   Sleeping: bed blocks on side one pillow s  resp cc on cpap (see download 10/06/2023 )  SABA use: none  02: 3lpm except when sleeping on cpap    No obvious day to day or daytime variability or assoc excess/ purulent sputum or mucus plugs or hemoptysis or cp or chest tightness, subjective wheeze or overt sinus or hb symptoms.    Also denies any obvious fluctuation of symptoms with weather or environmental changes or other aggravating or alleviating factors except as outlined above   No unusual exposure hx or h/o childhood pna/ asthma or knowledge of premature birth.  Current Allergies, Complete Past Medical History, Past Surgical History, Family History, and Social History were reviewed in [employer] record.  ROS  The following are not active complaints unless bolded Hoarseness, sore throat, dysphagia, dental problems, itching, sneezing,  nasal congestion or discharge of excess mucus or purulent secretions, ear ache,   fever, chills, sweats, unintended wt loss or wt gain, classically pleuritic or exertional cp,  orthopnea pnd or arm/hand swelling  or leg swelling, presyncope, palpitations, abdominal pain, anorexia, nausea, vomiting, diarrhea  or change in  bowel habits or change in bladder habits, change in stools or change in urine, dysuria, hematuria,  rash, arthralgias, visual complaints, headache, numbness, weakness or ataxia or problems with walking or coordination,  change in mood or  memory.        Current Meds  Medication Sig   aspirin EC 81 MG tablet Take 81 mg by mouth daily.   atorvastatin (LIPITOR) 80 MG tablet Take 1 tablet (80 mg total) by mouth daily at 6 PM.   budesonide-formoterol (SYMBICORT) 80-4.5 MCG/ACT inhaler Take 2 puffs first thing in am and then another 2 puffs about 12 hours later.   busPIRone (BUSPAR) 7.5 MG tablet Take 15 mg by mouth 3 (three) times daily.   cholecalciferol (VITAMIN D3) 25 MCG (1000 UNIT) tablet Take 1,000 Units by mouth daily.   DULoxetine (CYMBALTA) 30 MG capsule Take 30 mg by mouth daily. Take with 60 mg to equal 90 mg   DULoxetine (CYMBALTA) 60 MG capsule Take 60 mg by mouth daily. Take with 30 mg to equal 90 mg   FEROSUL 325 (65 Fe) MG tablet Take 325 mg by mouth every other day.   fexofenadine (ALLEGRA) 180 MG tablet Take 180 mg by mouth daily.   fluticasone (FLONASE) 50 MCG/ACT nasal spray Place 2 sprays into both nostrils daily as needed for allergies.    furosemide (LASIX) 40 MG tablet Take 1 tablet (40 mg total) by mouth daily.   gabapentin (NEURONTIN) 300 MG capsule Take 900 mg by mouth 3 (three) times daily.   isosorbide mononitrate (IMDUR) 30 MG 24 hr tablet TAKE ONE TABLET BY MOUTH ONCE DAILY   metFORMIN (GLUCOPHAGE) 500 MG tablet Take 500 mg by mouth daily with breakfast.   metoprolol tartrate (LOPRESSOR) 25 MG tablet Take 1 tablet (25 mg total) by mouth 2 (two) times daily. (Patient taking differently: Take 25 mg by mouth daily.)   metroNIDAZOLE (METROCREAM) 0.75 % cream Apply 1 Application topically 2 (two) times daily.   nitroGLYCERIN (NITROSTAT) 0.4 MG SL tablet Place 1 tablet (0.4 mg  total) under the tongue every 5 (five) minutes as needed for chest pain.   omeprazole (PRILOSEC) 20  MG capsule Take 20 mg by mouth daily.   oxyCODONE-acetaminophen (PERCOCET) 10-325 MG per tablet Take 1 tablet by mouth every 8 (eight) hours as needed for pain.   tiZANidine (ZANAFLEX) 4 MG tablet Take 4 mg by mouth 3 (three) times daily.              Past Medical History:  Diagnosis Date   Arthritis    Bilateral hand pain    CAD (coronary artery disease)    a. s/p NSTEMI in 2016 with DES to mid-RCA, DES to [facility] and DES to mid-LAD b. low-risk NST in 05/2016 and 11/2018   Cervical radiculopathy    Chronic neck and back pain    Emphysema lung (HCC)    Fibromyalgia    Hypertension    Kidney stones    hx of w previous lithotripsy   MI (myocardial infarction) (HCC)    Pain management    [facility]   Thoracic back pain        Objective:    Wts  10/06/2023     175  04/08/2023       176    09/29/22 170 lb 9.6 oz (77.4 kg)  08/12/22 169 lb 9.6 oz (76.9 kg)  08/07/22 168 lb 6.4 oz (76.4 kg)    Vital signs reviewed  10/06/2023  - Note at rest 02 sats  96% on 3lpm pulsed    General appearance:    amb mod obese slt hoarse wf nad     HEENT : Oropharynx  clear          NECK :  without  apparent JVD/ palpable Nodes/TM    LUNGS: no acc muscle use,  Nl contour chest which is clear to A and P bilaterally without cough on insp or exp maneuvers   CV:  RRR  no s3 or murmur or increase in P2, and no edema   ABD:  soft and nontender   MS:  Gait nl   ext warm without deformities Or obvious joint restrictions  calf tenderness, cyanosis or clubbing    SKIN: warm and dry without lesions    NEURO:  alert, approp, nl sensorium with  no motor or cerebellar deficits apparent.       I personally reviewed images and agree with radiology impression as follows:   Chest CTa   09/10/23 1. No evidence of embolism to the proximal subsegmental pulmonary artery level. 2. Crazy paving pattern of bilateral lungs. Differential diagnosis includes pulmonary edema, ARDS, acute interstitial  pneumonia, pulmonary alveolar proteinosis, etc. 3. Mosaic attenuation of bilateral lungs, consistent with heterogeneous air trapping related to small airway disease. 4. Small right pleural effusion. 5. Persistent moderate mediastinal and hilar lymphadenopathy, of indeterminate age but likely benign/reactive in the given clinical settings. 6. Multiple other nonacute observations, as described above.      Assessment

## 2023-10-06 ENCOUNTER — Encounter: Payer: Self-pay | Admitting: Internal Medicine

## 2023-10-06 ENCOUNTER — Ambulatory Visit: Payer: Medicare Other | Admitting: Internal Medicine

## 2023-10-06 VITALS — BP 96/62 | HR 82 | Ht 62.5 in | Wt 175.0 lb

## 2023-10-06 DIAGNOSIS — J9611 Chronic respiratory failure with hypoxia: Secondary | ICD-10-CM | POA: Diagnosis not present

## 2023-10-06 DIAGNOSIS — G4733 Obstructive sleep apnea (adult) (pediatric): Secondary | ICD-10-CM | POA: Diagnosis not present

## 2023-10-06 DIAGNOSIS — J9612 Chronic respiratory failure with hypercapnia: Secondary | ICD-10-CM

## 2023-10-06 DIAGNOSIS — J449 Chronic obstructive pulmonary disease, unspecified: Secondary | ICD-10-CM

## 2023-10-06 DIAGNOSIS — F17210 Nicotine dependence, cigarettes, uncomplicated: Secondary | ICD-10-CM

## 2023-10-06 NOTE — Patient Instructions (Addendum)
My office will be contacting you by phone for referral for overnight oximetry on cpap   - if you don't hear back from my office within one week please call us back or notify us thru MyChart and we'll address it right away.   Make sure you check your oxygen saturation  AT  your highest level of activity (not after you stop)   to be sure it stays over 90% and adjust  02 flow upward to maintain this level if needed but remember to turn it back to previous settings when you stop (to conserve your supply).    Add spiriva 2 puffs  in am only, and only after you take symbiocrt 80 x 2 puffs first   Work on inhaler technique:  relax and gently blow all the way out then take a nice smooth full deep breath back in, triggering the inhaler at same time you start breathing in.  Hold breath in for at least  5 seconds if you can.     We will try to get a download on cpap before we do the overnight    The key is to stop smoking completely before smoking completely stops you!          Marland Kitchen

## 2023-10-07 NOTE — Assessment & Plan Note (Signed)
4-5 min discussion re active cigarette smoking in addition to office E&M  Ask about tobacco use:   ongoing  Advise quitting   I took an extended  opportunity with this patient to outline the consequences of continued cigarette use  in airway disorders based on all the data we have from the multiple national lung health studies (perfomed over decades at millions of dollars in cost)  indicating that smoking cessation, not choice of inhalers or pulmonary physicians, is the most important aspect of her  care.   02 use and cigarettes are a concern for fire hazard/ reviewed.  Assess willingness:  Not committed at this point Assist in quit attempt:  Per PCP when ready Arrange follow up:   Follow up per Primary Care planned

## 2023-10-07 NOTE — Assessment & Plan Note (Signed)
HC03  08/12/2022   =  32  - 08/12/2022 patient walked at a moderate pace on 2LO2 cont x 450 ft ft with . SOB starting after 150 ft but did not stop with  lowest sats 94%  - Need ONO on cpap and RA ordered  10/06/2023 >>>  Daytime recs: 3lpm base ok but  make sure you check your oxygen saturation  AT  your highest level of activity (not after you stop)   to be sure it stays over 90% and adjust  02 flow upward to maintain this level if needed but remember to turn it back to previous settings when you stop (to conserve your supply).

## 2023-10-07 NOTE — Assessment & Plan Note (Signed)
Active smoker with PET CT c/w emphysema and AB 07/31/22  - 08/12/2022  After extensive coaching inhaler device,  effectiveness =    75% so try breztri 2bid and approp saba - PFT's  09/29/22   FEV1 2.29 (89 % ) ratio 0.84  p 0 % improvement from saba p Breztri  prior to study with DLCO  7.25 (37%)   and FV curve nl  - 04/08/2023  After extensive coaching inhaler device,  effectiveness =   75% > try symbicort 80   >>> 10/06/2023  After extensive coaching inhaler device,  effectiveness =    75% from a baseline of 0 try adding spiriva 2.5 x 2 each am to symbicort to see if improves any of her symptoms for which cardiac asthma may be a component ([facility] drug of choice)

## 2023-10-07 NOTE — Assessment & Plan Note (Addendum)
Download 10/05/23  - Usage days 20/30 / avg 5 h 34 min on days used, AHI 5.7 on CPAP  9 cm   Rec she use CPAP more consistently and monitor sats by ONO on cpap but no 02 (which is what she is doing now)   F/u 3 m sooner prn          Each maintenance medication was reviewed in detail including emphasizing most importantly the difference between maintenance and prns and under what circumstances the prns are to be triggered using an action plan format where appropriate.  Total time for H and P, chart review, counseling, reviewing hfa/smi/ 02/cpap/ pulse ox  device(s) and generating customized AVS unique to this office visit / same day charting = 30 min post hosp

## 2023-10-12 ENCOUNTER — Ambulatory Visit (HOSPITAL_COMMUNITY)
Admission: RE | Admit: 2023-10-12 | Discharge: 2023-10-12 | Disposition: A | Discharge status: Home / Self Care | Payer: Medicare Other | Source: Ambulatory Visit | Attending: Cardiology | Admitting: Cardiology

## 2023-10-12 ENCOUNTER — Encounter (HOSPITAL_COMMUNITY): Payer: Medicare Other

## 2023-10-12 ENCOUNTER — Ambulatory Visit (HOSPITAL_COMMUNITY): Admission: RE | Admit: 2023-10-12 | Payer: Medicare Other | Source: Ambulatory Visit

## 2023-10-12 DIAGNOSIS — I50810 Right heart failure, unspecified: Secondary | ICD-10-CM | POA: Insufficient documentation

## 2023-10-12 LAB — ECHOCARDIOGRAM LIMITED: S' Lateral: 2.8 cm

## 2023-10-12 NOTE — Progress Notes (Signed)
*  PRELIMINARY RESULTS* Echocardiogram Limited 2-D Echocardiogram  has been performed.  Stacey Drain 10/12/2023, 10:26 AM

## 2023-10-22 ENCOUNTER — Telehealth: Payer: Self-pay | Admitting: Internal Medicine

## 2023-10-22 MED ORDER — SPIRIVA RESPIMAT 2.5 MCG/ACT IN AERS
2.0000 | INHALATION_SPRAY | Freq: Every day | RESPIRATORY_TRACT | 11 refills | Status: AC
Start: 2023-10-22 — End: ?

## 2023-10-22 NOTE — Telephone Encounter (Signed)
 Patient was given a sample of Spiriva at her last visit with Dr. Peterson Lombard would like a prescription called to [facility] in [place] call back  [phone]

## 2023-10-22 NOTE — Telephone Encounter (Signed)
 Rx for spiriva sent to preferred pharmacy  Pt aware  Nothing further needed

## 2023-10-28 ENCOUNTER — Ambulatory Visit (HOSPITAL_COMMUNITY)
Admission: RE | Admit: 2023-10-28 | Discharge: 2023-10-28 | Disposition: A | Discharge status: Home / Self Care | Payer: Medicare Other | Source: Ambulatory Visit | Attending: Cardiology | Admitting: Cardiology

## 2023-10-28 ENCOUNTER — Encounter (HOSPITAL_BASED_OUTPATIENT_CLINIC_OR_DEPARTMENT_OTHER)
Admission: RE | Admit: 2023-10-28 | Discharge: 2023-10-28 | Disposition: A | Discharge status: Home / Self Care | Payer: Medicare Other | Source: Ambulatory Visit | Attending: Cardiology

## 2023-10-28 ENCOUNTER — Encounter (HOSPITAL_COMMUNITY): Payer: Self-pay

## 2023-10-28 DIAGNOSIS — R079 Chest pain, unspecified: Secondary | ICD-10-CM

## 2023-10-28 HISTORY — DX: Malignant (primary) neoplasm, unspecified: C80.1

## 2023-10-28 HISTORY — DX: Heart failure, unspecified: I50.9

## 2023-10-28 HISTORY — DX: Type 2 diabetes mellitus without complications: E11.9

## 2023-10-28 LAB — NM MYOCAR MULTI W/SPECT W/WALL MOTION / EF
Base ST Depression (mm): 1 mm
LV dias vol: 47 mL (ref 46–106)
LV sys vol: 17 mL
Nuc Stress EF: 64 %
Peak HR: 88 {beats}/min
RATE: 0.3
Rest HR: 67 {beats}/min
Rest Nuclear Isotope Dose: 10.3 mCi
SDS: 5
SRS: 1
SSS: 6
ST Depression (mm): 0 mm
Stress Nuclear Isotope Dose: 31 mCi
TID: 0.87

## 2023-10-28 MED ORDER — TECHNETIUM TC 99M TETROFOSMIN IV KIT
10.0000 | PACK | Freq: Once | INTRAVENOUS | Status: AC | PRN
  Administered 2023-10-28: 10.3 via INTRAVENOUS

## 2023-10-28 MED ORDER — REGADENOSON 0.4 MG/5ML IV SOLN
INTRAVENOUS | Status: AC
  Administered 2023-10-28: 0.4 mg via INTRAVENOUS
  Filled 2023-10-28: qty 5

## 2023-10-28 MED ORDER — SODIUM CHLORIDE FLUSH 0.9 % IV SOLN
INTRAVENOUS | Status: AC
  Administered 2023-10-28: 10 mL via INTRAVENOUS
  Filled 2023-10-28: qty 10

## 2023-10-28 MED ORDER — TECHNETIUM TC 99M TETROFOSMIN IV KIT
30.0000 | PACK | Freq: Once | INTRAVENOUS | Status: AC | PRN
Start: 2023-10-28 — End: 2023-10-28
  Administered 2023-10-28: 31 via INTRAVENOUS

## 2023-11-04 ENCOUNTER — Telehealth: Payer: Self-pay | Admitting: Internal Medicine

## 2023-11-04 NOTE — Telephone Encounter (Signed)
 Dr. Rolande Cleverly would like to speak with Dr. Waymond Hailey regarding future treatment plans for this patient----Dr. Donella Fulling phone # [phone]

## 2023-11-04 NOTE — Telephone Encounter (Signed)
 Please call PCP, Thank you!

## 2023-11-09 NOTE — Telephone Encounter (Signed)
I tried but this number was not a valid working phone number but happy to have her call med at [phone] any time

## 2023-11-15 NOTE — Progress Notes (Deleted)
Tanya Lowe, female    DOB: [birth year]    MRN: [id number]   Brief patient profile:  53   yowf active smoker with allergic rhinitis worse in spring//asthma periodically more need albuterol x 2013  referred to pulmonary clinic in [place]  08/12/2022 by Orpah Cobb DO  for copd eval  and proved to have COPD 0/ AB by pfts 09/29/22       History of Present Illness  08/12/2022  Pulmonary/ 1st office eval/ Sherene Sires / [facility]  no maint rx Chief Complaint  Patient presents with   Consult    Consult for copd abnormal CT scan. Sent home from hospital on 2LO2 cont    Dyspnea:  with 02 2lpm doing better and able to do [employer]/ uses HC parking  Cough: periodically yellowish brown  Sleep: on cpap s 02 SABA use: very confused with instructions on how/when to use which one but neb helps the most 02 :  2lpm daytime but not adjusting  Rec My office will be contacting you by phone for referral for PFTs    Plan A = Automatic = Always=    Breztri Take 2 puffs first thing in am and then another 2 puffs about 12 hours later.   Work on inhaler technique:   Plan B = Backup (to supplement plan A, not to replace it) Only use your albuterol inhaler as a rescue medication Plan C = Crisis (instead of Plan B but only if Plan B stops working) - only use your albuterol nebulizer if you first try Plan B  Make sure you check your oxygen saturation  AT  your highest level of activity (not after you stop)   to be sure it stays over 90%  Plug 02 into your cpap @ 2lpm - call 02 supplier to get the right connection  The key is to stop smoking completely before smoking completely stops you!   09/29/2022  f/u ov/Elizbeth Posa re: doe  maint on Breztri  /02  s airflow obst on PFTs 09/29/2022 p breztri prior Chief Complaint  Patient presents with   Follow-up    Review PFT today.  Doing well today   Dyspnea:  able to do  [employer] on 2lpm  Cough: none x  after am breztri Sleeping: cpap no 02  SABA use: none   02: 2lpm when walking but not checking sats  Covid status:   vax none/ infected x one  Rec Stop lisinopril and start valsartan 80 mg use one half daily  - ok to take whole pill if needed to keep systolic less than 120  Make sure you check your oxygen saturation  AT  your highest level of activity (not after you stop)   to be sure it stays over 90%      04/08/2023  f/u ov/[place] office/Zakarie Sturdivant re:AB maint on breztri  Chief Complaint  Patient presents with   Follow-up    Pt f/u states that she is feeling bad today and feels like she may have a URI.   Dyspnea:  only sltly worse since uri onset  Cough: worse x one month with nasal congestion slt yellow  Sleeping: bed blocks/ cpap fine  SABA use: none even with flare  02: 2lpm prn but not noct and usually not needed  - LDSCT q April last done 2024  Rec Zpak For cough / congestion : mucinex dm 1200 mg every 12 hours as needed (over the counter)  Try change breztri to  symbicort 80  Take 2 puffs first thing in am and then another 2 puffs about 12 hours later.  Work on inhaler technique:\\     Admit date:     09/10/2023  Discharge date: 09/13/23   Recommendations at discharge:  Follow up with cardiology after discharge for CHF hospitalization follow up. Increased lasix from 20mg  daily to 40mg  daily, will need repeat BMP and volume assessment.  Follow up with PCP in 1-2 weeks Follow up with pulmonology, Dr. Sherene Sires for ongoing management of chronic respiratory failure, consider PETCT for evaluation of lymphadenopathy (see details below).   Discharge Diagnoses: Principal Problem:   Acute on chronic diastolic CHF (congestive heart failure) (HCC)   Obesity   Tobacco abuse   Essential hypertension   GERD (gastroesophageal reflux disease)   Type 2 diabetes mellitus (HCC)   CAD (coronary artery disease)   COPD GOLD 0 / active smoker/AB   Mediastinal lymphadenopathy   ILD (interstitial lung disease) (HCC)   Severe pulmonary hypertension  (HCC)   OSA on CPAP   Acute on chronic hypoxic respiratory failure (HCC)   Acute on chronic diastolic heart failure [facility])   Hospital Course: Tanya Lowe is a 53 y.o. female with a history of ICM, chronic HFpEF, COPD with chronic 2L O2-dependence, ongoing tobacco use, HTN, HLD, and obesity who presented to the ED on 09/10/2023 due to dyspnea having been sent from her PCP's office where she was hypoxemic. Work up including elevated BNP and pulmonary edema pattern on CTA chest was consistent with volume overload, so IV lasix was started, echocardiogram ordered, and the patient was admitted for acute on chronic hypoxic respiratory failure due to acute on chronic HFpEF.    Assessment and Plan: Acute on chronic hypoxic respiratory failure: CTA chest does have more broad DDx, though clinically not behaving like ARDS, no wheezing to suggest AECOPD, and concomitant weight gain/edema, elevated BNP, and improvement with diuresis thus far supports diagnosis of acute CHF. Also treating for PNA given clinical uncertainty.  - Weaned to 2L.  - Tx conditions below - Really needs to use IS and mobilize.    Acute on chronic HFpEF, HTN:  - Updated echo shows preserved LVEF, moderate RV enlargement and systolic dysfunction, suggests severe pulmonary HTN. Perfusion scan negative.  - Increase 20mg  lasix dosing to 40mg  and follow up soon after discharge.   - Monitor weights at home   Pneumonia: RSV, flu, covid PCRs negative.  - Complete 5 days abx.     Demand myocardial ischemia, hx CAD/NSTEMI: Mild and downtrending troponin elevation in setting of respiratory failure, no anginal complaints.  - Defer ischemic evaluation per cardiology. No regional abnormalities in wall motion on echo.  - Continue baseline ASA, statin, BB, prn NTG.    OSA:  - CPAP qHS   COPD:  - Continue controller medications and prns   Tobacco use:  - Cessation counseling provided   GERD:  - PPI   Lymphadenopathy:  -She has  abnormally enlarged lymph nodes in the mediastinum and both hilar regions with possible increased size concerning for active inflammatory or neoplastic process -Recommending following up with a PET CT scan and tissue sampling this can be done in outpatient setting   T2DM: HbA1c 6.3%.     Obesity: Body mass index is 32.94 kg/m.    Chronic back pain:  - Continue home Tx.     GERD:  - PPI    10/06/2023  f/u ov/[place] office/Maris Abascal re: COPD 0/AB maint on  symbicort 80  Take 2 puffs first thing in am and then another 2 puffs about 12 hours later/still smoking  Chief Complaint  Patient presents with   COPD   Shortness of Breath  Dyspnea:  has not been back walking at  [employer] yet  Cough: clear mucus only   Sleeping: bed blocks on side one pillow s  resp cc on cpap (see download 10/06/2023 )  SABA use: none  02: 3lpm except when sleeping on cpap  Rec My office will be contacting you by phone for referral for overnight oximetry on cpap     Make sure you check your oxygen saturation  AT  your highest level of activity (not after you stop)   to be sure it stays over 90%  Add spiriva 2 puffs  in am only, and only after you take symbiocrt 80 x 2 puffs first  Work on inhaler technique:  relax and gently blow all the way out then take a nice smooth full deep breath back in, triggering the inhaler at same time you start breathing in.  Hold breath in for at leas  5 seconds if you can.   We will try to get a download on cpap before we do the overnight   The key is to stop smoking completely before smoking completely stops you!    11/17/2023  f/u ov/[place] [facility]/Miu Chiong re: *** maint on ***  No chief complaint on file.   Dyspnea:  *** Cough: *** Sleeping: ***   resp cc  SABA use: *** 02: ***  Lung cancer screening: ***   No obvious day to day or daytime variability or assoc excess/ purulent sputum or mucus plugs or hemoptysis or cp or chest tightness, subjective wheeze or overt  sinus or hb symptoms.    Also denies any obvious fluctuation of symptoms with weather or environmental changes or other aggravating or alleviating factors except as outlined above   No unusual exposure hx or h/o childhood pna/ asthma or knowledge of premature birth.  Current Allergies, Complete Past Medical History, Past Surgical History, Family History, and Social History were reviewed in [employer] record.  ROS  The following are not active complaints unless bolded Hoarseness, sore throat, dysphagia, dental problems, itching, sneezing,  nasal congestion or discharge of excess mucus or purulent secretions, ear ache,   fever, chills, sweats, unintended wt loss or wt gain, classically pleuritic or exertional cp,  orthopnea pnd or arm/hand swelling  or leg swelling, presyncope, palpitations, abdominal pain, anorexia, nausea, vomiting, diarrhea  or change in bowel habits or change in bladder habits, change in stools or change in urine, dysuria, hematuria,  rash, arthralgias, visual complaints, headache, numbness, weakness or ataxia or problems with walking or coordination,  change in mood or  memory.        No outpatient medications have been marked as taking for the 11/17/23 encounter (Appointment) with Nyoka Cowden, MD.            Past Medical History:  Diagnosis Date   Arthritis    Bilateral hand pain    CAD (coronary artery disease)    a. s/p NSTEMI in 2016 with DES to mid-RCA, DES to [facility] and DES to mid-LAD b. low-risk NST in 05/2016 and 11/2018   Cervical radiculopathy    Chronic neck and back pain    Emphysema lung (HCC)    Fibromyalgia    Hypertension    Kidney stones    hx of  w previous lithotripsy   MI (myocardial infarction) (HCC)    Pain management    [facility]   Thoracic back pain        Objective:    Wts   11/17/2023        ***  10/06/2023     175  04/08/2023       176    09/29/22 170 lb 9.6 oz (77.4 kg)  08/12/22 169 lb 9.6 oz (76.9  kg)  08/07/22 168 lb 6.4 oz (76.4 kg)     Vital signs reviewed  11/17/2023  - Note at rest 02 sats  ***% on ***   General appearance:    ***             I personally reviewed images and agree with radiology impression as follows:   Chest CTa   09/10/23 1. No evidence of embolism to the proximal subsegmental pulmonary artery level. 2. Crazy paving pattern of bilateral lungs. Differential diagnosis includes pulmonary edema, ARDS, acute interstitial pneumonia, pulmonary alveolar proteinosis, etc. 3. Mosaic attenuation of bilateral lungs, consistent with heterogeneous air trapping related to small airway disease. 4. Small right pleural effusion. 5. Persistent moderate mediastinal and hilar lymphadenopathy, of indeterminate age but likely benign/reactive in the given clinical settings. 6. Multiple other nonacute observations, as described above.      Assessment

## 2023-11-17 ENCOUNTER — Ambulatory Visit: Payer: Medicare Other | Admitting: Internal Medicine

## 2023-11-17 NOTE — Telephone Encounter (Signed)
Patient's primary care doctor is calling and would like to discuss with Dr.Wert ways they can proceed with the care of this patient. Gave her Dr.Wert's number as well.

## 2023-11-17 NOTE — Telephone Encounter (Signed)
Have you discuss this with patient pcp.please advise

## 2023-11-30 NOTE — Progress Notes (Deleted)
 Tanya Lowe, female    DOB: 1970-12-26    MRN: [id number]   Brief patient profile:  1   yowf active smoker with allergic rhinitis worse in spring//asthma periodically more need albuterol x 2013  referred to pulmonary clinic in [place]  08/12/2022 by Orpah Cobb DO  for copd eval  and proved to have COPD 0/ AB by pfts 09/29/22       History of Present Illness  08/12/2022  Pulmonary/ 1st office eval/ Sherene Sires / [facility]  no maint rx Chief Complaint  Patient presents with   Consult    Consult for copd abnormal CT scan. Sent home from hospital on 2LO2 cont    Dyspnea:  with 02 2lpm doing better and able to do [employer]/ uses [employer] parking  Cough: periodically yellowish brown  Sleep: on cpap s 02 SABA use: very confused with instructions on how/when to use which one but neb helps the most 02 :  2lpm daytime but not adjusting  Rec My office will be contacting you by phone for referral for PFTs    Plan A = Automatic = Always=    Breztri Take 2 puffs first thing in am and then another 2 puffs about 12 hours later.   Work on inhaler technique:   Plan B = Backup (to supplement plan A, not to replace it) Only use your albuterol inhaler as a rescue medication Plan C = Crisis (instead of Plan B but only if Plan B stops working) - only use your albuterol nebulizer if you first try Plan B  Make sure you check your oxygen saturation  AT  your highest level of activity (not after you stop)   to be sure it stays over 90%  Plug 02 into your cpap @ 2lpm - call 02 supplier to get the right connection  The key is to stop smoking completely before smoking completely stops you!   09/29/2022  f/u ov/Andreana Klingerman re: doe  maint on Breztri  /02  s airflow obst on PFTs 09/29/2022 p breztri prior Chief Complaint  Patient presents with   Follow-up    Review PFT today.  Doing well today   Dyspnea:  able to do  [employer] on 2lpm  Cough: none x  after am breztri Sleeping: cpap no 02  SABA use: none   02: 2lpm when walking but not checking sats  Covid status:   vax none/ infected x one  Rec Stop lisinopril and start valsartan 80 mg use one half daily  - ok to take whole pill if needed to keep systolic less than 120  Make sure you check your oxygen saturation  AT  your highest level of activity (not after you stop)   to be sure it stays over 90%      04/08/2023  f/u ov/[place] office/Eugine Bubb re:AB maint on breztri  Chief Complaint  Patient presents with   Follow-up    Pt f/u states that she is feeling bad today and feels like she may have a URI.   Dyspnea:  only sltly worse since uri onset  Cough: worse x one month with nasal congestion slt yellow  Sleeping: bed blocks/ cpap fine  SABA use: none even with flare  02: 2lpm prn but not noct and usually not needed  - LDSCT q April last done 2024  Rec Zpak For cough / congestion : mucinex dm 1200 mg every 12 hours as needed (over the counter)  Try change breztri to  symbicort 80  Take 2 puffs first thing in am and then another 2 puffs about 12 hours later.  Work on inhaler technique:\\     Admit date:     09/10/2023  Discharge date: 09/13/23   Recommendations at discharge:  Follow up with cardiology after discharge for CHF hospitalization follow up. Increased lasix from 20mg  daily to 40mg  daily, will need repeat BMP and volume assessment.  Follow up with PCP in 1-2 weeks Follow up with pulmonology, Dr. Sherene Sires for ongoing management of chronic respiratory failure, consider PETCT for evaluation of lymphadenopathy (see details below).   Discharge Diagnoses: Principal Problem:   Acute on chronic diastolic CHF (congestive heart failure) (HCC)   Obesity   Tobacco abuse   Essential hypertension   GERD (gastroesophageal reflux disease)   Type 2 diabetes mellitus (HCC)   CAD (coronary artery disease)   COPD GOLD 0 / active smoker/AB   Mediastinal lymphadenopathy   ILD (interstitial lung disease) (HCC)   Severe pulmonary hypertension  (HCC)   OSA on CPAP   Acute on chronic hypoxic respiratory failure (HCC)   Acute on chronic diastolic heart failure [facility])   Hospital Course: Tanya Lowe is a 53 y.o. female with a history of ICM, chronic HFpEF, COPD with chronic 2L O2-dependence, ongoing tobacco use, HTN, HLD, and obesity who presented to the ED on 09/10/2023 due to dyspnea having been sent from her PCP's office where she was hypoxemic. Work up including elevated BNP and pulmonary edema pattern on CTA chest was consistent with volume overload, so IV lasix was started, echocardiogram ordered, and the patient was admitted for acute on chronic hypoxic respiratory failure due to acute on chronic HFpEF.    Assessment and Plan: Acute on chronic hypoxic respiratory failure: CTA chest does have more broad DDx, though clinically not behaving like ARDS, no wheezing to suggest AECOPD, and concomitant weight gain/edema, elevated BNP, and improvement with diuresis thus far supports diagnosis of acute CHF. Also treating for PNA given clinical uncertainty.  - Weaned to 2L.  - Tx conditions below - Really needs to use IS and mobilize.    Acute on chronic HFpEF, HTN:  - Updated echo shows preserved LVEF, moderate RV enlargement and systolic dysfunction, suggests severe pulmonary HTN. Perfusion scan negative.  - Increase 20mg  lasix dosing to 40mg  and follow up soon after discharge.   - Monitor weights at home   Pneumonia: RSV, flu, covid PCRs negative.  - Complete 5 days abx.     Demand myocardial ischemia, hx CAD/NSTEMI: Mild and downtrending troponin elevation in setting of respiratory failure, no anginal complaints.  - Defer ischemic evaluation per cardiology. No regional abnormalities in wall motion on echo.  - Continue baseline ASA, statin, BB, prn NTG.    OSA:  - CPAP qHS   COPD:  - Continue controller medications and prns   Tobacco use:  - Cessation counseling provided   GERD:  - PPI   Lymphadenopathy:  -She has  abnormally enlarged lymph nodes in the mediastinum and both hilar regions with possible increased size concerning for active inflammatory or neoplastic process -Recommending following up with a PET CT scan and tissue sampling this can be done in outpatient setting   T2DM: HbA1c 6.3%.     Obesity: Body mass index is 32.94 kg/m.    Chronic back pain:  - Continue home Tx.     GERD:  - PPI    10/06/2023  f/u ov/[place] office/Adya Wirz re: COPD 0/AB maint on  symbicort 80  Take 2 puffs first thing in am and then another 2 puffs about 12 hours later/still smoking  Chief Complaint  Patient presents with   COPD   Shortness of Breath  Dyspnea:  has not been back walking at  [employer] yet  Cough: clear mucus only   Sleeping: bed blocks on side one pillow s  resp cc on cpap (see download 10/06/2023 )  SABA use: none  02: 3lpm except when sleeping on cpap  Rec My office will be contacting you by phone for referral for overnight oximetry on cpap   Make sure you check your oxygen saturation  AT  your highest level of activity (not after you stop)   to be sure it stays over 90%  Add spiriva 2 puffs  in am only, and only after you take symbiocrt 80 x 2 puffs first  Work on inhaler technique:  relax and gently blow all the way out then take a nice smooth full deep breath back in, triggering the inhaler at same time you start breathing in.  Hold breath in for at leas  5 seconds if you can.   We will try to get a download on cpap before we do the overnight   The key is to stop smoking completely before smoking completely stops you!    12/02/2023  f/u ov/ office/Messina Kosinski re: *** maint on ***  No chief complaint on file.   Dyspnea:  *** Cough: *** Sleeping: ***   resp cc  SABA use: *** 02: ***  Lung cancer screening: ***   No obvious day to day or daytime variability or assoc excess/ purulent sputum or mucus plugs or hemoptysis or cp or chest tightness, subjective wheeze or overt  sinus or hb symptoms.    Also denies any obvious fluctuation of symptoms with weather or environmental changes or other aggravating or alleviating factors except as outlined above   No unusual exposure hx or h/o childhood pna/ asthma or knowledge of premature birth.  Current Allergies, Complete Past Medical History, Past Surgical History, Family History, and Social History were reviewed in [employer] record.  ROS  The following are not active complaints unless bolded Hoarseness, sore throat, dysphagia, dental problems, itching, sneezing,  nasal congestion or discharge of excess mucus or purulent secretions, ear ache,   fever, chills, sweats, unintended wt loss or wt gain, classically pleuritic or exertional cp,  orthopnea pnd or arm/hand swelling  or leg swelling, presyncope, palpitations, abdominal pain, anorexia, nausea, vomiting, diarrhea  or change in bowel habits or change in bladder habits, change in stools or change in urine, dysuria, hematuria,  rash, arthralgias, visual complaints, headache, numbness, weakness or ataxia or problems with walking or coordination,  change in mood or  memory.        No outpatient medications have been marked as taking for the 12/02/23 encounter (Appointment) with Nyoka Cowden, MD.            Past Medical History:  Diagnosis Date   Arthritis    Bilateral hand pain    CAD (coronary artery disease)    a. s/p NSTEMI in 2016 with DES to mid-RCA, DES to [facility] and DES to mid-LAD b. low-risk NST in 05/2016 and 11/2018   Cervical radiculopathy    Chronic neck and back pain    Emphysema lung (HCC)    Fibromyalgia    Hypertension    Kidney stones    hx of w previous  lithotripsy   MI (myocardial infarction) (HCC)    Pain management    [facility]   Thoracic back pain        Objective:    Wts   12/02/2023        ***  10/06/2023     175  04/08/2023       176    09/29/22 170 lb 9.6 oz (77.4 kg)  08/12/22 169 lb 9.6 oz (76.9  kg)  08/07/22 168 lb 6.4 oz (76.4 kg)     Vital signs reviewed  12/02/2023  - Note at rest 02 sats  ***% on ***   General appearance:    ***             I personally reviewed images and agree with radiology impression as follows:   Chest CTa   09/10/23 1. No evidence of embolism to the proximal subsegmental pulmonary artery level. 2. Crazy paving pattern of bilateral lungs. Differential diagnosis includes pulmonary edema, ARDS, acute interstitial pneumonia, pulmonary alveolar proteinosis, etc. 3. Mosaic attenuation of bilateral lungs, consistent with heterogeneous air trapping related to small airway disease. 4. Small right pleural effusion. 5. Persistent moderate mediastinal and hilar lymphadenopathy, of indeterminate age but likely benign/reactive in the given clinical settings. 6. Multiple other nonacute observations, as described above.      Assessment

## 2023-12-02 ENCOUNTER — Ambulatory Visit: Payer: Medicare Other | Admitting: Internal Medicine

## 2023-12-24 ENCOUNTER — Ambulatory Visit: Payer: Medicare Other | Admitting: Student

## 2023-12-24 NOTE — Progress Notes (Deleted)
 Cardiology Office Note    Date:  12/24/2023  ID:  Tanya Lowe, DOB [birth year], MRN [id number] Cardiologist: Dina Rich, MD    History of Present Illness:    Tanya Lowe is a 53 y.o. female with past medical history of CAD (s/p NSTEMI in 2016 with DES to mid-RCA, DES to [facility] and DES to mid-LAD, low-risk NST in 05/2016 and 11/2018), chronic HFpEF, HTN, HLD, prediabetes, fibromyalgia, COPD/chronic hypoxic respiratory failure, OSA (on CPAP) and continued tobacco use who presents to the office today for 104-month follow-up.  She was last exam by Dr. Wyline Mood in 09/2023 and reported occasional episodes of right ear pain into her jaw which would occur a few times a month and occur at rest or with activity but jaw pain was her anginal symptom in the past.  PASP have been significantly elevated by recent echocardiogram and a follow-up limited echocardiogram was recommended for reassessment along with a Lexiscan Myoview.  Limited echo in 09/2023 showed a preserved EF of 60 to 65% but she did have moderately reduced RV function and tricuspid regurgitation signal was not adequate for assessing PA pressure.  This was previously severely elevated at 74.6 mmHg in 08/2023 (felt to be group 2/3).  NST in 10/2023 showed evidence of prior anterior infarct with very mild peri-infarct ischemia and was overall a low-risk study.  ROS: ***  Studies Reviewed:   EKG: EKG is*** ordered today and demonstrates ***   EKG Interpretation Date/Time:    Ventricular Rate:    PR Interval:    QRS Duration:    QT Interval:    QTC Calculation:   R Axis:      Text Interpretation:         Limited Echo: 09/2023 IMPRESSIONS     1. Limited study.   2. Left ventricular ejection fraction, by estimation, is 60 to 65%. The  left ventricle has normal function. The interventricular septum is  flattened in systole and diastole, consistent with right ventricular  pressure and volume overload.   3. Right ventricular  systolic function is moderately reduced. The right  ventricular size is moderately enlarged. Tricuspid regurgitation signal is  inadequate for assessing PA pressure.   4. The aortic valve is tricuspid.   5. The inferior vena cava is normal in size with greater than 50%  respiratory variability, suggesting right atrial pressure of 3 mmHg.   Comparison(s): Prior images reviewed side by side. Unable to estimate RVSP  on the current study.   NST: 10/2023   Findings are consistent with prior anterior infarction with very mild peri-infarct ischemia. The study is low risk.   No ST deviation was noted.   LV perfusion is abnormal. Large mild to moderate intensity anterior defect with very mild reversibility   Left ventricular function is normal. Nuclear stress EF: 64%. The left ventricular ejection fraction is normal (55-65%). End diastolic cavity size is normal.   Severe gut uptake may affect perfusion findings.  Risk Assessment/Calculations:   {Does this patient have ATRIAL FIBRILLATION?:[id number]} No BP recorded.  {Refresh Note OR Click here to enter BP  :1}***         Physical Exam:   VS:  There were no vitals taken for this visit.   Wt Readings from Last 3 Encounters:  10/06/23 175 lb (79.4 kg)  09/25/23 174 lb (78.9 kg)  09/13/23 175 lb 0.7 oz (79.4 kg)     GEN: Well nourished, well developed in no acute distress NECK: No JVD;  No carotid bruits CARDIAC: ***RRR, no murmurs, rubs, gallops RESPIRATORY:  Clear to auscultation without rales, wheezing or rhonchi  ABDOMEN: Appears non-distended. No obvious abdominal masses. EXTREMITIES: No clubbing or cyanosis. No edema.  Distal pedal pulses are 2+ bilaterally.   Assessment and Plan:   1. Chronic HFpEF - ***  2. CAD - ***  3. COPD - ***  4. HLD - ***  5. HTN - ***  Signed, Ellsworth Lennox, PA-C

## 2023-12-27 NOTE — Progress Notes (Deleted)
 Tanya Lowe, female    DOB: 1971-07-05    MRN: [id number]   Brief patient profile:  35  yowf active smoker with allergic rhinitis worse in spring//asthma periodically more need albuterol x 2013  referred to pulmonary clinic in [place]  08/12/2022 by Tanya Cobb DO  for copd eval  and proved to have COPD 0/ AB by pfts 09/29/22       History of Present Illness  08/12/2022  Pulmonary/ 1st office eval/ Tanya [facility] / [facility]  no maint rx Chief Complaint  Patient presents with   Consult    Consult for copd abnormal CT scan. Sent home from [facility] on 2LO2 cont    Dyspnea:  with 02 2lpm doing better and able to do [employer]/ uses [employer] parking  Cough: periodically yellowish brown  Sleep: on cpap s 02 SABA use: very confused with instructions on how/when to use which one but neb helps the most 02 :  2lpm daytime but not adjusting  Rec My office will be contacting you by phone for referral for PFTs    Plan A = Automatic = Always=    Breztri Take 2 puffs first thing in am and then another 2 puffs about 12 hours later.   Work on inhaler technique:   Plan B = Backup (to supplement plan A, not to replace it) Only use your albuterol inhaler as a rescue medication Plan C = Crisis (instead of Plan B but only if Plan B stops working) - only use your albuterol nebulizer if you first try Plan B  Make sure you check your oxygen saturation  AT  your highest level of activity (not after you stop)   to be sure it stays over 90%  Plug 02 into your cpap @ 2lpm - call 02 supplier to get the right connection  The key is to stop smoking completely before smoking completely stops you!   09/29/2022  f/u ov/Tanya Lowe re: doe  maint on Breztri  /02  s airflow obst on PFTs 09/29/2022 p breztri prior Chief Complaint  Patient presents with   Follow-up    Review PFT today.  Doing well today   Dyspnea:  able to do  [employer] on 2lpm  Cough: none x  after am breztri Sleeping: cpap no 02  SABA use: none   02: 2lpm when walking but not checking sats  Covid status:   vax none/ infected x one  Rec Stop lisinopril and start valsartan 80 mg use one half daily  - ok to take whole pill if needed to keep systolic less than 120  Make sure you check your oxygen saturation  AT  your highest level of activity (not after you stop)   to be sure it stays over 90%      04/08/2023  f/u ov/[place] office/Tanya Lowe re:AB maint on breztri  Chief Complaint  Patient presents with   Follow-up    Pt f/u states that she is feeling bad today and feels like she may have a URI.   Dyspnea:  only sltly worse since uri onset  Cough: worse x one month with nasal congestion slt yellow  Sleeping: bed blocks/ cpap fine  SABA use: none even with flare  02: 2lpm prn but not noct and usually not needed  - LDSCT q April last done 2024  Rec Zpak For cough / congestion : mucinex dm 1200 mg every 12 hours as needed (over the counter)  Try change breztri to symbicort  80  Take 2 puffs first thing in am and then another 2 puffs about 12 hours later.  Work on inhaler technique:\\     Admit date:     09/10/2023  Discharge date: 09/13/23   Recommendations at discharge:  Follow up with cardiology after discharge for CHF hospitalization follow up. Increased lasix from 20mg  daily to 40mg  daily, will need repeat BMP and volume assessment.  Follow up with PCP in 1-2 weeks Follow up with pulmonology, Tanya Lowe for ongoing management of chronic respiratory failure, consider PETCT for evaluation of lymphadenopathy (see details below).   Discharge Diagnoses: Principal Problem:   Acute on chronic diastolic CHF (congestive heart failure) (HCC)   Obesity   Tobacco abuse   Essential hypertension   GERD (gastroesophageal reflux disease)   Type 2 diabetes mellitus (HCC)   CAD (coronary artery disease)   COPD GOLD 0 / active smoker/AB   Mediastinal lymphadenopathy   ILD (interstitial lung disease) (HCC)   Severe pulmonary hypertension  (HCC)   OSA on CPAP   Acute on chronic hypoxic respiratory failure (HCC)   Acute on chronic diastolic heart failure Tanya Lowe)   [facility] Course: Tanya Lowe is a 53 y.o. female with a history of ICM, chronic HFpEF, COPD with chronic 2L O2-dependence, ongoing tobacco use, HTN, HLD, and obesity who presented to the ED on 09/10/2023 due to dyspnea having been sent from her PCP's office where she was hypoxemic. Work up including elevated BNP and pulmonary edema pattern on CTA chest was consistent with volume overload, so IV lasix was started, echocardiogram ordered, and the patient was admitted for acute on chronic hypoxic respiratory failure due to acute on chronic HFpEF.    Assessment and Plan: Acute on chronic hypoxic respiratory failure: CTA chest does have more broad DDx, though clinically not behaving like ARDS, no wheezing to suggest AECOPD, and concomitant weight gain/edema, elevated BNP, and improvement with diuresis thus far supports diagnosis of acute CHF. Also treating for PNA given clinical uncertainty.  - Weaned to 2L.  - Tx conditions below - Really needs to use IS and mobilize.    Acute on chronic HFpEF, HTN:  - Updated echo shows preserved LVEF, moderate RV enlargement and systolic dysfunction, suggests severe pulmonary HTN. Perfusion scan negative.  - Increase 20mg  lasix dosing to 40mg  and follow up soon after discharge.   - Monitor weights at home   Pneumonia: RSV, flu, covid PCRs negative.  - Complete 5 days abx.     Demand myocardial ischemia, hx CAD/NSTEMI: Mild and downtrending troponin elevation in setting of respiratory failure, no anginal complaints.  - Defer ischemic evaluation per cardiology. No regional abnormalities in wall motion on echo.  - Continue baseline ASA, statin, BB, prn NTG.    OSA:  - CPAP qHS   COPD:  - Continue controller medications and prns   Tobacco use:  - Cessation counseling provided   GERD:  - PPI   Lymphadenopathy:  -She has  abnormally enlarged lymph nodes in the mediastinum and both hilar regions with possible increased size concerning for active inflammatory or neoplastic process -Recommending following up with a PET CT scan and tissue sampling this can be done in outpatient setting   T2DM: HbA1c 6.3%.     Obesity: Body mass index is 32.94 kg/m.    Chronic back pain:  - Continue home Tx.     GERD:  - PPI    10/06/2023  f/u ov/[place] office/Keiland Pickering re: COPD 0/AB maint on symbicort  80  Take 2 puffs first thing in am and then another 2 puffs about 12 hours later/still smoking  Chief Complaint  Patient presents with   COPD   Shortness of Breath  Dyspnea:  has not been back walking at  [employer] yet  Cough: clear mucus only   Sleeping: bed blocks on side one pillow s  resp cc on cpap (see download 10/06/2023 )  SABA use: none  02: 3lpm except when sleeping on cpap  Rec My office will be contacting you by phone for referral for overnight oximetry on cpap   Make sure you check your oxygen saturation  AT  your highest level of activity (not after you stop)   to be sure it stays over 90%  Add spiriva 2 puffs  in am only, and only after you take symbiocrt 80 x 2 puffs first  Work on inhaler technique:    The key is to stop smoking completely before smoking completely stops you!  Download 10/06/23  - Usage days 20/30 / avg 5 h 34 min on days used, AHI 5.7 on CPAP  9 cm     12/28/2023  f/u ov/[place] office/Malan Werk re: *** maint on ***  No chief complaint on file.   Dyspnea:  *** Cough: *** Sleeping: ***   resp cc  SABA use: *** 02: ***  Lung cancer screening: ***   No obvious day to day or daytime variability or assoc excess/ purulent sputum or mucus plugs or hemoptysis or cp or chest tightness, subjective wheeze or overt sinus or hb symptoms.    Also denies any obvious fluctuation of symptoms with weather or environmental changes or other aggravating or alleviating factors except as outlined  above   No unusual exposure hx or h/o childhood pna/ asthma or knowledge of premature birth.  Current Allergies, Complete Past Medical History, Past Surgical History, Family History, and Social History were reviewed in [employer] record.  ROS  The following are not active complaints unless bolded Hoarseness, sore throat, dysphagia, dental problems, itching, sneezing,  nasal congestion or discharge of excess mucus or purulent secretions, ear ache,   fever, chills, sweats, unintended wt loss or wt gain, classically pleuritic or exertional cp,  orthopnea pnd or arm/hand swelling  or leg swelling, presyncope, palpitations, abdominal pain, anorexia, nausea, vomiting, diarrhea  or change in bowel habits or change in bladder habits, change in stools or change in urine, dysuria, hematuria,  rash, arthralgias, visual complaints, headache, numbness, weakness or ataxia or problems with walking or coordination,  change in mood or  memory.        No outpatient medications have been marked as taking for the 12/28/23 encounter (Appointment) with Nyoka Cowden, MD.            Past Medical History:  Diagnosis Date   Arthritis    Bilateral hand pain    CAD (coronary artery disease)    a. s/p NSTEMI in 2016 with DES to mid-RCA, DES to [facility] and DES to mid-LAD b. low-risk NST in 05/2016 and 11/2018   Cervical radiculopathy    Chronic neck and back pain    Emphysema lung (HCC)    Fibromyalgia    Hypertension    Kidney stones    hx of w previous lithotripsy   MI (myocardial infarction) (HCC)    Pain management    [facility]   Thoracic back pain        Objective:    Wts  12/28/2023        ***  10/06/2023     175  04/08/2023       176    09/29/22 170 lb 9.6 oz (77.4 kg)  08/12/22 169 lb 9.6 oz (76.9 kg)  08/07/22 168 lb 6.4 oz (76.4 kg)     Vital signs reviewed  12/28/2023  - Note at rest 02 sats  ***% on ***   General appearance:    ***             I  personally reviewed images and agree with radiology impression as follows:   Chest CTa   09/10/23 1. No evidence of embolism to the proximal subsegmental pulmonary artery level. 2. Crazy paving pattern of bilateral lungs. Differential diagnosis includes pulmonary edema, ARDS, acute interstitial pneumonia, pulmonary alveolar proteinosis, etc. 3. Mosaic attenuation of bilateral lungs, consistent with heterogeneous air trapping related to small airway disease. 4. Small right pleural effusion. 5. Persistent moderate mediastinal and hilar lymphadenopathy, of indeterminate age but likely benign/reactive in the given clinical settings. 6. Multiple other nonacute observations, as described above.      Assessment

## 2023-12-28 ENCOUNTER — Ambulatory Visit: Payer: Medicare Other | Admitting: Internal Medicine

## 2023-12-29 NOTE — Progress Notes (Signed)
 Tanya Lowe, female    DOB: [birth year]    MRN: [id number]   Brief patient profile:  11  yowf active smoker with allergic rhinitis worse in spring//asthma periodically more need albuterol x 2013  referred to pulmonary clinic in [place]  08/12/2022 by Orpah Cobb DO  for copd eval  and proved to have COPD 0/ AB by pfts 09/29/22       History of Present Illness  08/12/2022  Pulmonary/ 1st office eval/ Sherene Sires / [facility]  no maint rx Chief Complaint  Patient presents with   Consult    Consult for copd abnormal CT scan. Sent home from hospital on 2LO2 cont    Dyspnea:  with 02 2lpm doing better and able to do [employer]/ uses [employer] parking  Cough: periodically yellowish brown  Sleep: on cpap s 02 SABA use: very confused with instructions on how/when to use which one but neb helps the most 02 :  2lpm daytime but not adjusting  Rec My office will be contacting you by phone for referral for PFTs    Plan A = Automatic = Always=    Breztri Take 2 puffs first thing in am and then another 2 puffs about 12 hours later.   Work on inhaler technique:   Plan B = Backup (to supplement plan A, not to replace it) Only use your albuterol inhaler as a rescue medication Plan C = Crisis (instead of Plan B but only if Plan B stops working) - only use your albuterol nebulizer if you first try Plan B  Make sure you check your oxygen saturation  AT  your highest level of activity (not after you stop)   to be sure it stays over 90%  Plug 02 into your cpap @ 2lpm - call 02 supplier to get the right connection  The key is to stop smoking completely before smoking completely stops you!   09/29/2022  f/u ov/Sadeen Wiegel re: doe  maint on Breztri  /02  s airflow obst on PFTs 09/29/2022 p breztri prior Chief Complaint  Patient presents with   Follow-up    Review PFT today.  Doing well today   Dyspnea:  able to do  [employer] on 2lpm  Cough: none x  after am breztri Sleeping: cpap no 02  SABA use: none   02: 2lpm when walking but not checking sats  Covid status:   vax none/ infected x one  Rec Stop lisinopril and start valsartan 80 mg use one half daily  - ok to take whole pill if needed to keep systolic less than 120  Make sure you check your oxygen saturation  AT  your highest level of activity (not after you stop)   to be sure it stays over 90%      04/08/2023  f/u ov/[place] office/Krishna Heuer re:AB maint on breztri  Chief Complaint  Patient presents with   Follow-up    Pt f/u states that she is feeling bad today and feels like she may have a URI.   Dyspnea:  only sltly worse since uri onset  Cough: worse x one month with nasal congestion slt yellow  Sleeping: bed blocks/ cpap fine  SABA use: none even with flare  02: 2lpm prn but not noct and usually not needed  - LDSCT q April last done 2024  Rec Zpak For cough / congestion : mucinex dm 1200 mg every 12 hours as needed (over the counter)  Try change breztri to symbicort  80  Take 2 puffs first thing in am and then another 2 puffs about 12 hours later.  Work on inhaler technique:\\     Admit date:     09/10/2023  Discharge date: 09/13/23   Recommendations at discharge:  Follow up with cardiology after discharge for CHF hospitalization follow up. Increased lasix from 20mg  daily to 40mg  daily, will need repeat BMP and volume assessment.  Follow up with PCP in 1-2 weeks Follow up with pulmonology, Dr. Sherene Sires for ongoing management of chronic respiratory failure, consider PETCT for evaluation of lymphadenopathy (see details below).   Discharge Diagnoses: Principal Problem:   Acute on chronic diastolic CHF (congestive heart failure) (HCC)   Obesity   Tobacco abuse   Essential hypertension   GERD (gastroesophageal reflux disease)   Type 2 diabetes mellitus (HCC)   CAD (coronary artery disease)   COPD GOLD 0 / active smoker/AB   Mediastinal lymphadenopathy   ILD (interstitial lung disease) (HCC)   Severe pulmonary hypertension  (HCC)   OSA on CPAP   Acute on chronic hypoxic respiratory failure (HCC)   Acute on chronic diastolic heart failure [facility])   Hospital Course: Tanya Lowe is a 53 y.o. female with a history of ICM, chronic HFpEF, COPD with chronic 2L O2-dependence, ongoing tobacco use, HTN, HLD, and obesity who presented to the ED on 09/10/2023 due to dyspnea having been sent from her PCP's office where she was hypoxemic. Work up including elevated BNP and pulmonary edema pattern on CTA chest was consistent with volume overload, so IV lasix was started, echocardiogram ordered, and the patient was admitted for acute on chronic hypoxic respiratory failure due to acute on chronic HFpEF.    Assessment and Plan: Acute on chronic hypoxic respiratory failure: CTA chest does have more broad DDx, though clinically not behaving like ARDS, no wheezing to suggest AECOPD, and concomitant weight gain/edema, elevated BNP, and improvement with diuresis thus far supports diagnosis of acute CHF. Also treating for PNA given clinical uncertainty.  - Weaned to 2L.  - Tx conditions below - Really needs to use IS and mobilize.    Acute on chronic HFpEF, HTN:  - Updated echo shows preserved LVEF, moderate RV enlargement and systolic dysfunction, suggests severe pulmonary HTN. Perfusion scan negative.  - Increase 20mg  lasix dosing to 40mg  and follow up soon after discharge.   - Monitor weights at home   Pneumonia: RSV, flu, covid PCRs negative.  - Complete 5 days abx.     Demand myocardial ischemia, hx CAD/NSTEMI: Mild and downtrending troponin elevation in setting of respiratory failure, no anginal complaints.  - Defer ischemic evaluation per cardiology. No regional abnormalities in wall motion on echo.  - Continue baseline ASA, statin, BB, prn NTG.    OSA:  - CPAP qHS   COPD:  - Continue controller medications and prns   Tobacco use:  - Cessation counseling provided   GERD:  - PPI   Lymphadenopathy:  -She has  abnormally enlarged lymph nodes in the mediastinum and both hilar regions with possible increased size concerning for active inflammatory or neoplastic process -Recommending following up with a PET CT scan and tissue sampling this can be done in outpatient setting   T2DM: HbA1c 6.3%.     Obesity: Body mass index is 32.94 kg/m.    Chronic back pain:  - Continue home Tx.     GERD:  - PPI    10/06/2023  f/u ov/[place] office/Demontrae Gilbert re: COPD 0/AB maint on symbicort  80  Take 2 puffs first thing in am and then another 2 puffs about 12 hours later/still smoking  Chief Complaint  Patient presents with   COPD   Shortness of Breath  Dyspnea:  has not been back walking at  [employer] yet  Cough: clear mucus only   Sleeping: bed blocks on side one pillow s  resp cc on cpap (see download 10/06/2023 )  SABA use: none  02: 3lpm except when sleeping on cpap  Rec My office will be contacting you by phone for referral for overnight oximetry on cpap   Make sure you check your oxygen saturation  AT  your highest level of activity (not after you stop)   to be sure it stays over 90%  Add spiriva 2 puffs  in am only, and only after you take symbiocrt 80 x 2 puffs first  Work on inhaler technique:    The key is to stop smoking completely before smoking completely stops you!   Download 10/06/23  - Usage days 20/30 / avg 5 h 34 min on days used, AHI 5.7 on CPAP  9 cm     12/31/2023  f/u ov/ office/Lateefa Crosby re: COPD / AB  maint on symbicort 80 / spiriva each am   Chief Complaint  Patient presents with   Follow-up  Dyspnea:  no [employer]  Cough: minimal mucoid esp in am clears p couple of coughs  Sleeping: on cpap   s resp cc  SABA use: not using  02:  3lpm daytime (not while on cpap)   Lung cancer screening: not due until 09/09/24 since had CTa 11/21/124    No obvious day to day or daytime variability or assoc excess/ purulent sputum or mucus plugs or hemoptysis or cp or chest  tightness, subjective wheeze or overt sinus or hb symptoms.    Also denies any obvious fluctuation of symptoms with weather or environmental changes or other aggravating or alleviating factors except as outlined above   No unusual exposure hx or h/o childhood pna/ asthma or knowledge of premature birth.  Current Allergies, Complete Past Medical History, Past Surgical History, Family History, and Social History were reviewed in [employer] record.  ROS  The following are not active complaints unless bolded Hoarseness, sore throat, dysphagia, dental problems, itching, sneezing,  nasal congestion or discharge of excess mucus or purulent secretions, ear ache,   fever, chills, sweats, unintended wt loss or wt gain, classically pleuritic or exertional cp,  orthopnea pnd or arm/hand swelling  or leg swelling, presyncope, palpitations, abdominal pain, anorexia, nausea, vomiting, diarrhea  or change in bowel habits or change in bladder habits, change in stools or change in urine, dysuria, hematuria,  rash, arthralgias, visual complaints, headache, numbness, weakness or ataxia or problems with walking or coordination,  change in mood or  memory.        Current Meds  Medication Sig   aspirin EC 81 MG tablet Take 81 mg by mouth daily.   atorvastatin (LIPITOR) 80 MG tablet Take 1 tablet (80 mg total) by mouth daily at 6 PM.   budesonide-formoterol (SYMBICORT) 80-4.5 MCG/ACT inhaler Take 2 puffs first thing in am and then another 2 puffs about 12 hours later.   busPIRone (BUSPAR) 7.5 MG tablet Take 15 mg by mouth 3 (three) times daily.   cholecalciferol (VITAMIN D3) 25 MCG (1000 UNIT) tablet Take 1,000 Units by mouth daily.   DULoxetine (CYMBALTA) 30 MG capsule Take 30 mg by  mouth daily. Take with 60 mg to equal 90 mg   DULoxetine (CYMBALTA) 60 MG capsule Take 60 mg by mouth daily. Take with 30 mg to equal 90 mg   FEROSUL 325 (65 Fe) MG tablet Take 325 mg by mouth every other day.    fexofenadine (ALLEGRA) 180 MG tablet Take 180 mg by mouth daily.   fluticasone (FLONASE) 50 MCG/ACT nasal spray Place 2 sprays into both nostrils daily as needed for allergies.    gabapentin (NEURONTIN) 300 MG capsule Take 900 mg by mouth 3 (three) times daily.   isosorbide mononitrate (IMDUR) 30 MG 24 hr tablet TAKE ONE TABLET BY MOUTH ONCE DAILY   metFORMIN (GLUCOPHAGE) 500 MG tablet Take 500 mg by mouth daily with breakfast.   metoprolol tartrate (LOPRESSOR) 25 MG tablet Take 1 tablet (25 mg total) by mouth 2 (two) times daily. (Patient taking differently: Take 25 mg by mouth daily.)   metroNIDAZOLE (METROCREAM) 0.75 % cream Apply 1 Application topically 2 (two) times daily.   nitroGLYCERIN (NITROSTAT) 0.4 MG SL tablet Place 1 tablet (0.4 mg total) under the tongue every 5 (five) minutes as needed for chest pain.   omeprazole (PRILOSEC) 20 MG capsule Take 20 mg by mouth daily.   oxyCODONE-acetaminophen (PERCOCET) 10-325 MG per tablet Take 1 tablet by mouth every 8 (eight) hours as needed for pain.   Tiotropium Bromide Monohydrate (SPIRIVA RESPIMAT) 2.5 MCG/ACT AERS Inhale 2 puffs into the lungs daily.   tiZANidine (ZANAFLEX) 4 MG tablet Take 4 mg by mouth 3 (three) times daily.            Past Medical History:  Diagnosis Date   Arthritis    Bilateral hand pain    CAD (coronary artery disease)    a. s/p NSTEMI in 2016 with DES to mid-RCA, DES to [facility] and DES to mid-LAD b. low-risk NST in 05/2016 and 11/2018   Cervical radiculopathy    Chronic neck and back pain    Emphysema lung (HCC)    Fibromyalgia    Hypertension    Kidney stones    hx of w previous lithotripsy   MI (myocardial infarction) (HCC)    Pain management    [facility]   Thoracic back pain        Objective:    Wts   12/31/2023       178  10/06/2023     175  04/08/2023       176    09/29/22 170 lb 9.6 oz (77.4 kg)  08/12/22 169 lb 9.6 oz (76.9 kg)  08/07/22 168 lb 6.4 oz (76.4 kg)     Vital signs reviewed   12/31/2023  - Note at rest 02 sats  87% on 3lpm cont     General appearance:    amb wf nad  / clear lungs     HEENT : Oropharynx  clear      Nasal turbinates nl    NECK :  without  apparent JVD/ palpable Nodes/TM    LUNGS: no acc muscle use,  Nl contour chest which is clear to A and P bilaterally without cough on insp or exp maneuvers   CV:  RRR  no s3 or murmur or increase in P2, and no edema   ABD:  soft and nontender   MS:  Gait nl   ext warm without deformities Or obvious joint restrictions  calf tenderness, cyanosis or clubbing    SKIN: warm and dry without lesions  NEURO:  alert, approp, nl sensorium with  no motor or cerebellar deficits apparent. .           I personally reviewed images and agree with radiology impression as follows:   Chest CTa   09/10/23 1. No evidence of embolism to the proximal subsegmental pulmonary artery level. 2. Crazy paving pattern of bilateral lungs. Differential diagnosis includes pulmonary edema, ARDS, acute interstitial pneumonia, pulmonary alveolar proteinosis, etc. 3. Mosaic attenuation of bilateral lungs, consistent with heterogeneous air trapping related to small airway disease. 4. Small right pleural effusion. 5. Persistent moderate mediastinal and hilar lymphadenopathy, of indeterminate age but likely benign/reactive in the given clinical settings. 6. Multiple other nonacute observations, as described above.      Assessment

## 2023-12-31 ENCOUNTER — Ambulatory Visit: Admitting: Internal Medicine

## 2023-12-31 ENCOUNTER — Encounter: Payer: Self-pay | Admitting: Internal Medicine

## 2023-12-31 VITALS — BP 99/63 | HR 111 | Ht 62.3 in | Wt 178.4 lb

## 2023-12-31 DIAGNOSIS — F17210 Nicotine dependence, cigarettes, uncomplicated: Secondary | ICD-10-CM

## 2023-12-31 DIAGNOSIS — J9611 Chronic respiratory failure with hypoxia: Secondary | ICD-10-CM | POA: Diagnosis not present

## 2023-12-31 DIAGNOSIS — J9612 Chronic respiratory failure with hypercapnia: Secondary | ICD-10-CM | POA: Diagnosis not present

## 2023-12-31 DIAGNOSIS — J449 Chronic obstructive pulmonary disease, unspecified: Secondary | ICD-10-CM

## 2023-12-31 NOTE — Patient Instructions (Addendum)
 Make sure you check your oxygen saturation  AT  your highest level of activity (not after you stop)   to be sure it stays over 90% and adjust  02 flow upward to maintain this level if needed but remember to turn it back to previous settings when you stop (to conserve your supply).   We will walk you today to determine your oxygen needs with activity   The key is to stop smoking completely before smoking completely stops you!    Please schedule a follow up visit in 6 months but call sooner if needed

## 2024-01-02 NOTE — Assessment & Plan Note (Signed)
 HC03  08/12/2022   =  32  - 08/12/2022 patient walked at a moderate pace on 2LO2 cont x 450 ft ft with . SOB starting after 150 ft but did not stop with  lowest sats 94%  - ono on cpap and no 02 10/06/2023 >>>  - 12/31/2023   Walked on 3lpm   x  3  lap(s) =  approx 450  ft  @ moderate  pace, stopped due to desats  with lowest 02 sats 87%  >>>  increased flow to to 4lpm and was able to walk the 3 lap oxygen with sats 90% at end.  Advised: Make sure you check your oxygen saturation  AT  your highest level of activity (not after you stop)   to be sure it stays over 90% and adjust  02 flow upward to maintain this level if needed but remember to turn it back to previous settings when you stop (to conserve your supply).

## 2024-01-02 NOTE — Assessment & Plan Note (Signed)
 Counseled re importance of smoking cessation but did not meet time criteria for separate billing    Each maintenance medication was reviewed in detail including emphasizing most importantly the difference between maintenance and prns and under what circumstances the prns are to be triggered using an action plan format where appropriate.  Total time for H and P, chart review, counseling, reviewing hfa/ dpi/ 02 /pusle ox device(s) , directly observing portions of ambulatory 02 saturation study/ and generating customized AVS unique to this office visit / same day charting = 31 min

## 2024-01-02 NOTE — Assessment & Plan Note (Signed)
 Active smoker with PET CT c/w emphysema and AB 07/31/22  - 08/12/2022  After extensive coaching inhaler device,  effectiveness =    75% so try breztri 2bid and approp saba - PFT's  09/29/22   FEV1 2.29 (89 % ) ratio 0.84  p 0 % improvement from saba p Breztri  prior to study with DLCO  7.25 (37%)   and FV curve nl  - 04/08/2023  After extensive coaching inhaler device,  effectiveness =   75% > try symbicort 80  - 10/06/2023  After extensive coaching inhaler device,  effectiveness =    75% from a baseline of 0 try adding spiriva 2.5 x 2 each am to symbicort to see if improves any of her symptoms for which cardiac asthma may be a component (LAMA drug of choice)   Although has not met criteria for copd,   Group D (now reclassified as E) in terms of symptom/risk and laba/lama/ICS  therefore appropriate rx at this point >>>  symicort 80/ spiriva 2.5 for now plus already on approp saba

## 2024-01-22 ENCOUNTER — Telehealth: Payer: Self-pay

## 2024-01-22 NOTE — Telephone Encounter (Signed)
 Received ONO results which indicate test was performed on ROOM AIR and not on CPAP.   Per Dr. Sherene Sires this test will need to be repeated on CPAP if this was not done. She will also need OV with sleep provider.  Attempted to contact patient, no answer and vm full. Sent [facility] message asking her to contact office.

## 2024-01-26 NOTE — Telephone Encounter (Signed)
 Attempted to contact patient. No answer and vm is full. Per chart patient has not read previous MyChart message.

## 2024-02-04 NOTE — Telephone Encounter (Signed)
Attempted to contact patient, no answer and vm is full.

## 2024-02-23 LAB — COLOGUARD: COLOGUARD: NEGATIVE

## 2024-02-23 LAB — EXTERNAL GENERIC LAB PROCEDURE: COLOGUARD: NEGATIVE

## 2024-02-25 ENCOUNTER — Ambulatory Visit: Admitting: Student

## 2024-02-25 NOTE — Progress Notes (Deleted)
 Cardiology Office Note    Date:  02/25/2024  ID:  BENISHA CASTETTER, DOB Jan 10, 1971, MRN [id number] Cardiologist: Armida Lander, MD    History of Present Illness:    Tanya Lowe is a 53 y.o. female with past medical history of CAD (s/p NSTEMI in 2016 with DES to mid-RCA, DES to [facility] and DES to mid-LAD, low-risk NST in 05/2016 and 11/2018), chronic HFpEF, HTN, HLD, prediabetes, fibromyalgia, COPD/chronic hypoxic respiratory failure, OSA (on CPAP) and continued tobacco use who presents to the office today for 22-month follow-up.   She was last exam by Dr. Amanda Jungling in 09/2023 and reported occasional episodes of right ear pain into her jaw which would occur a few times a month and occur at rest or with activity but jaw pain was her anginal symptom in the past. PASP had been significantly elevated by recent echocardiogram and a follow-up limited echocardiogram was recommended for reassessment along with a Lexiscan  Myoview .  Limited echo in 09/2023 showed a preserved EF of 60 to 65% but she did have moderately reduced RV function and tricuspid regurgitation signal was not adequate for assessing PA pressure. This was previously severely elevated at 74.6 mmHg in 08/2023 (felt to be group 2/3). NST in 10/2023 showed evidence of prior anterior infarct with very mild peri-infarct ischemia and was overall a low-risk study.  ROS: ***  Studies Reviewed:   EKG: EKG is*** ordered today and demonstrates ***   EKG Interpretation Date/Time:    Ventricular Rate:    PR Interval:    QRS Duration:    QT Interval:    QTC Calculation:   R Axis:      Text Interpretation:         Limited Echo: 09/2023 IMPRESSIONS     1. Limited study.   2. Left ventricular ejection fraction, by estimation, is 60 to 65%. The  left ventricle has normal function. The interventricular septum is  flattened in systole and diastole, consistent with right ventricular  pressure and volume overload.   3. Right ventricular  systolic function is moderately reduced. The right  ventricular size is moderately enlarged. Tricuspid regurgitation signal is  inadequate for assessing PA pressure.   4. The aortic valve is tricuspid.   5. The inferior vena cava is normal in size with greater than 50%  respiratory variability, suggesting right atrial pressure of 3 mmHg.   Comparison(s): Prior images reviewed side by side. Unable to estimate RVSP  on the current study.    NST: 10/2023   Findings are consistent with prior anterior infarction with very mild peri-infarct ischemia. The study is low risk.   No ST deviation was noted.   LV perfusion is abnormal. Large mild to moderate intensity anterior defect with very mild reversibility   Left ventricular function is normal. Nuclear stress EF: 64%. The left ventricular ejection fraction is normal (55-65%). End diastolic cavity size is normal.   Severe gut uptake may affect perfusion findings.  Risk Assessment/Calculations:   {Does this patient have ATRIAL FIBRILLATION?:[phone]} No BP recorded.  {Refresh Note OR Click here to enter BP  :1}***         Physical Exam:   VS:  There were no vitals taken for this visit.   Wt Readings from Last 3 Encounters:  12/31/23 178 lb 6.4 oz (80.9 kg)  10/06/23 175 lb (79.4 kg)  09/25/23 174 lb (78.9 kg)     GEN: Well nourished, well developed in no acute distress NECK: No JVD; No  carotid bruits CARDIAC: ***RRR, no murmurs, rubs, gallops RESPIRATORY:  Clear to auscultation without rales, wheezing or rhonchi  ABDOMEN: Appears non-distended. No obvious abdominal masses. EXTREMITIES: No clubbing or cyanosis. No edema.  Distal pedal pulses are 2+ bilaterally.   Assessment and Plan:   1. Chronic HFpEF - ***   2. CAD - She has a history of an NSTEMI in 2016 with DES to mid-RCA, DES to distal-RCA and DES to mid-LAD. Most recent ischemic evaluation was a low-risk NST in 11/2018    3. COPD/Pulmonary HTN - She has known O2  dependent COPD and Pulmonary HTN felt to be Group 2/3. Prior VQ Scan in 08/2023 negative.    4. HLD - LDL was at 28 in 2024.   5. HTN - ***    Signed, Dorma Gash, PA-C

## 2024-03-21 ENCOUNTER — Encounter: Payer: Self-pay | Admitting: Internal Medicine

## 2024-03-22 NOTE — Progress Notes (Unsigned)
 Cardiology Office Note    Date:  03/22/2024  ID:  Tanya Lowe, DOB 16-Nov-1970, MRN [id number] Cardiologist: Armida Lander, MD    History of Present Illness:    Tanya Lowe is a 53 y.o. female with past medical history of CAD (s/p NSTEMI in 2016 with DES to mid-RCA, DES to [facility] and DES to mid-LAD, low-risk NST in 05/2016 and 11/2018), chronic HFpEF, HTN, HLD, prediabetes, fibromyalgia, COPD/chronic hypoxic respiratory failure, OSA (on CPAP) and continued tobacco use who presents to the office today for overdue 38-month follow-up.   She was last examined by Dr. Amanda Jungling in 09/2023 and reported occasional episodes of right ear pain into her jaw which would occur a few times a month and occur at rest or with activity but jaw pain was her anginal symptom in the past. PASP had been significantly elevated by recent echocardiogram and a follow-up limited echocardiogram was recommended for reassessment along with a Lexiscan  Myoview .  Limited echo in 09/2023 showed a preserved EF of 60 to 65% but she did have moderately reduced RV function and tricuspid regurgitation signal was not adequate for assessing PA pressure. This was previously severely elevated at 74.6 mmHg in 08/2023 (felt to be group 2/3). NST in 10/2023 showed evidence of prior anterior infarct with very mild peri-infarct ischemia and was overall a low-risk study. Was recommended to reassess symptoms at follow-up.   ROS: ***  Studies Reviewed:   EKG: EKG is*** ordered today and demonstrates ***   EKG Interpretation Date/Time:    Ventricular Rate:    PR Interval:    QRS Duration:    QT Interval:    QTC Calculation:   R Axis:      Text Interpretation:         Limited Echo: 09/2023 IMPRESSIONS     1. Limited study.   2. Left ventricular ejection fraction, by estimation, is 60 to 65%. The  left ventricle has normal function. The interventricular septum is  flattened in systole and diastole, consistent with right  ventricular  pressure and volume overload.   3. Right ventricular systolic function is moderately reduced. The right  ventricular size is moderately enlarged. Tricuspid regurgitation signal is  inadequate for assessing PA pressure.   4. The aortic valve is tricuspid.   5. The inferior vena cava is normal in size with greater than 50%  respiratory variability, suggesting right atrial pressure of 3 mmHg.   Comparison(s): Prior images reviewed side by side. Unable to estimate RVSP  on the current study.    NST: 10/2023   Findings are consistent with prior anterior infarction with very mild peri-infarct ischemia. The study is low risk.   No ST deviation was noted.   LV perfusion is abnormal. Large mild to moderate intensity anterior defect with very mild reversibility   Left ventricular function is normal. Nuclear stress EF: 64%. The left ventricular ejection fraction is normal (55-65%). End diastolic cavity size is normal.   Severe gut uptake may affect perfusion findings.  Risk Assessment/Calculations:   {Does this patient have ATRIAL FIBRILLATION?[phone]} No BP recorded.  {Refresh Note OR Click here to enter BP  :1}***         Physical Exam:   VS:  There were no vitals taken for this visit.   Wt Readings from Last 3 Encounters:  12/31/23 178 lb 6.4 oz (80.9 kg)  10/06/23 175 lb (79.4 kg)  09/25/23 174 lb (78.9 kg)     GEN: Well nourished, well  developed in no acute distress NECK: No JVD; No carotid bruits CARDIAC: ***RRR, no murmurs, rubs, gallops RESPIRATORY:  Clear to auscultation without rales, wheezing or rhonchi  ABDOMEN: Appears non-distended. No obvious abdominal masses. EXTREMITIES: No clubbing or cyanosis. No edema.  Distal pedal pulses are 2+ bilaterally.   Assessment and Plan:   1. Chronic HFpEF/RV Dysfunction - Echo in 09/2023 showed a preserved EF of 60-65% with moderately reduced RV function.    2. CAD - She has a history of an NSTEMI in 2016 with DES  to mid-RCA, DES to distal-RCA and DES to mid-LAD. Most recent ischemic evaluation was a low-risk NST in 10/2023.  3. COPD/Pulmonary HTN - She has known O2 dependent COPD and Pulmonary HTN felt to be Group 2/3. Prior VQ Scan in 08/2023 was negative.    4. HLD - LDL was at 28 in 2024.   5. HTN - BP is at *** during today's visit. ***    Signed, Dorma Gash, PA-C

## 2024-03-24 ENCOUNTER — Encounter: Payer: Self-pay | Admitting: Student

## 2024-03-24 ENCOUNTER — Telehealth: Payer: Self-pay | Admitting: Student

## 2024-03-24 ENCOUNTER — Telehealth: Payer: Self-pay | Admitting: *Deleted

## 2024-03-24 ENCOUNTER — Ambulatory Visit: Attending: Student | Admitting: Student

## 2024-03-24 VITALS — BP 112/72 | HR 71 | Ht 62.5 in | Wt 176.0 lb

## 2024-03-24 DIAGNOSIS — I2510 Atherosclerotic heart disease of native coronary artery without angina pectoris: Secondary | ICD-10-CM | POA: Diagnosis not present

## 2024-03-24 DIAGNOSIS — J9611 Chronic respiratory failure with hypoxia: Secondary | ICD-10-CM | POA: Diagnosis not present

## 2024-03-24 DIAGNOSIS — I10 Essential (primary) hypertension: Secondary | ICD-10-CM

## 2024-03-24 DIAGNOSIS — I5032 Chronic diastolic (congestive) heart failure: Secondary | ICD-10-CM | POA: Diagnosis not present

## 2024-03-24 DIAGNOSIS — I110 Hypertensive heart disease with heart failure: Secondary | ICD-10-CM

## 2024-03-24 DIAGNOSIS — E785 Hyperlipidemia, unspecified: Secondary | ICD-10-CM

## 2024-03-24 DIAGNOSIS — I272 Pulmonary hypertension, unspecified: Secondary | ICD-10-CM

## 2024-03-24 DIAGNOSIS — J9612 Chronic respiratory failure with hypercapnia: Secondary | ICD-10-CM

## 2024-03-24 MED ORDER — METOPROLOL TARTRATE 25 MG PO TABS: 25.0000 mg | ORAL_TABLET | Freq: Two times a day (BID) | ORAL | 3 refills | Status: DC

## 2024-03-24 NOTE — Telephone Encounter (Signed)
 Per Woodfin Hays, PA okay to switch to telephone call since patient feels unwell

## 2024-03-24 NOTE — Telephone Encounter (Signed)
 Pt not feeling well and would like to make her appt for today 6/05 virtual. Please advise

## 2024-03-24 NOTE — Telephone Encounter (Signed)
  Patient Consent for Virtual Visit        Tanya Lowe has provided verbal consent on 03/24/2024 for a virtual visit (video or telephone).   CONSENT FOR VIRTUAL VISIT FOR:  Tanya Lowe  By participating in this virtual visit I agree to the following:  I hereby voluntarily request, consent and authorize [facility] and its employed or contracted physicians, physician assistants, nurse practitioners or other licensed health care professionals (the Practitioner), to provide me with telemedicine health care services (the "Services") as deemed necessary by the treating Practitioner. I acknowledge and consent to receive the Services by the Practitioner via telemedicine. I understand that the telemedicine visit will involve communicating with the Practitioner through live audiovisual communication technology and the disclosure of certain medical information by electronic transmission. I acknowledge that I have been given the opportunity to request an in-person assessment or other available alternative prior to the telemedicine visit and am voluntarily participating in the telemedicine visit.  I understand that I have the right to withhold or withdraw my consent to the use of telemedicine in the course of my care at any time, without affecting my right to future care or treatment, and that the Practitioner or I may terminate the telemedicine visit at any time. I understand that I have the right to inspect all information obtained and/or recorded in the course of the telemedicine visit and may receive copies of available information for a reasonable fee.  I understand that some of the potential risks of receiving the Services via telemedicine include:  Delay or interruption in medical evaluation due to technological equipment failure or disruption; Information transmitted may not be sufficient (e.g. poor resolution of images) to allow for appropriate medical decision making by the Practitioner;  and/or  In rare instances, security protocols could fail, causing a breach of personal health information.  Furthermore, I acknowledge that it is my responsibility to provide information about my medical history, conditions and care that is complete and accurate to the best of my ability. I acknowledge that Practitioner's advice, recommendations, and/or decision may be based on factors not within their control, such as incomplete or inaccurate data provided by me or distortions of diagnostic images or specimens that may result from electronic transmissions. I understand that the practice of medicine is not an exact science and that Practitioner makes no warranties or guarantees regarding treatment outcomes. I acknowledge that a copy of this consent can be made available to me via my patient portal Tanya Lowe MyChart), or I can request a printed copy by calling the office of [facility].    I understand that my insurance will be billed for this visit.   I have read or had this consent read to me. I understand the contents of this consent, which adequately explains the benefits and risks of the Services being provided via telemedicine.  I have been provided ample opportunity to ask questions regarding this consent and the Services and have had my questions answered to my satisfaction. I give my informed consent for the services to be provided through the use of telemedicine in my medical care

## 2024-03-24 NOTE — Patient Instructions (Signed)

## 2024-04-14 ENCOUNTER — Emergency Department (HOSPITAL_COMMUNITY)

## 2024-04-14 ENCOUNTER — Encounter (HOSPITAL_COMMUNITY): Payer: Self-pay

## 2024-04-14 ENCOUNTER — Observation Stay (HOSPITAL_COMMUNITY)
Admission: EM | Admit: 2024-04-14 | Discharge: 2024-04-15 | Disposition: A | Discharge status: Home / Self Care | Attending: Cardiology | Admitting: Cardiology

## 2024-04-14 ENCOUNTER — Other Ambulatory Visit: Payer: Self-pay

## 2024-04-14 ENCOUNTER — Encounter (HOSPITAL_COMMUNITY)
Admission: EM | Disposition: A | Discharge status: Home / Self Care | Payer: Self-pay | Source: Home / Self Care | Attending: Emergency Medicine

## 2024-04-14 DIAGNOSIS — J9612 Chronic respiratory failure with hypercapnia: Secondary | ICD-10-CM | POA: Diagnosis not present

## 2024-04-14 DIAGNOSIS — Z85828 Personal history of other malignant neoplasm of skin: Secondary | ICD-10-CM | POA: Insufficient documentation

## 2024-04-14 DIAGNOSIS — I10 Essential (primary) hypertension: Secondary | ICD-10-CM | POA: Diagnosis present

## 2024-04-14 DIAGNOSIS — I2510 Atherosclerotic heart disease of native coronary artery without angina pectoris: Secondary | ICD-10-CM

## 2024-04-14 DIAGNOSIS — E119 Type 2 diabetes mellitus without complications: Secondary | ICD-10-CM | POA: Diagnosis not present

## 2024-04-14 DIAGNOSIS — Z7984 Long term (current) use of oral hypoglycemic drugs: Secondary | ICD-10-CM | POA: Diagnosis not present

## 2024-04-14 DIAGNOSIS — I200 Unstable angina: Secondary | ICD-10-CM | POA: Diagnosis not present

## 2024-04-14 DIAGNOSIS — J9611 Chronic respiratory failure with hypoxia: Secondary | ICD-10-CM | POA: Diagnosis present

## 2024-04-14 DIAGNOSIS — E785 Hyperlipidemia, unspecified: Secondary | ICD-10-CM | POA: Diagnosis not present

## 2024-04-14 DIAGNOSIS — I110 Hypertensive heart disease with heart failure: Secondary | ICD-10-CM | POA: Diagnosis not present

## 2024-04-14 DIAGNOSIS — I509 Heart failure, unspecified: Secondary | ICD-10-CM | POA: Insufficient documentation

## 2024-04-14 DIAGNOSIS — Z7982 Long term (current) use of aspirin: Secondary | ICD-10-CM | POA: Insufficient documentation

## 2024-04-14 DIAGNOSIS — Z79899 Other long term (current) drug therapy: Secondary | ICD-10-CM | POA: Insufficient documentation

## 2024-04-14 DIAGNOSIS — F17210 Nicotine dependence, cigarettes, uncomplicated: Secondary | ICD-10-CM | POA: Insufficient documentation

## 2024-04-14 DIAGNOSIS — I25110 Atherosclerotic heart disease of native coronary artery with unstable angina pectoris: Principal | ICD-10-CM | POA: Insufficient documentation

## 2024-04-14 DIAGNOSIS — J849 Interstitial pulmonary disease, unspecified: Secondary | ICD-10-CM | POA: Diagnosis not present

## 2024-04-14 DIAGNOSIS — R079 Chest pain, unspecified: Secondary | ICD-10-CM | POA: Diagnosis present

## 2024-04-14 DIAGNOSIS — Z955 Presence of coronary angioplasty implant and graft: Secondary | ICD-10-CM

## 2024-04-14 HISTORY — PX: LEFT HEART CATH AND CORONARY ANGIOGRAPHY: CATH118249

## 2024-04-14 HISTORY — PX: CORONARY STENT INTERVENTION: CATH118234

## 2024-04-14 LAB — CBC
HCT: 37 % (ref 36.0–46.0)
Hemoglobin: 11.9 g/dL — ABNORMAL LOW (ref 12.0–15.0)
MCH: 29.2 pg (ref 26.0–34.0)
MCHC: 32.2 g/dL (ref 30.0–36.0)
MCV: 90.9 fL (ref 80.0–100.0)
Platelets: 213 10*3/uL (ref 150–400)
RBC: 4.07 MIL/uL (ref 3.87–5.11)
RDW: 14.9 % (ref 11.5–15.5)
WBC: 8.6 10*3/uL (ref 4.0–10.5)
nRBC: 0 % (ref 0.0–0.2)

## 2024-04-14 LAB — POCT ACTIVATED CLOTTING TIME
Activated Clotting Time: 239 s
Activated Clotting Time: 297 s
Activated Clotting Time: 297 s
Activated Clotting Time: 291 s

## 2024-04-14 LAB — GLUCOSE, CAPILLARY: Glucose-Capillary: 134 mg/dL — ABNORMAL HIGH (ref 70–99)

## 2024-04-14 LAB — BASIC METABOLIC PANEL WITH GFR
Anion gap: 10 (ref 5–15)
BUN: 10 mg/dL (ref 6–20)
CO2: 25 mmol/L (ref 22–32)
Calcium: 9 mg/dL (ref 8.9–10.3)
Chloride: 104 mmol/L (ref 98–111)
Creatinine, Ser: 0.78 mg/dL (ref 0.44–1.00)
GFR, Estimated: >60 mL/min (ref >60)
Glucose, Bld: 97 mg/dL (ref 70–99)
Potassium: 4.2 mmol/L (ref 3.5–5.1)
Sodium: 139 mmol/L (ref 135–145)

## 2024-04-14 LAB — TROPONIN I (HIGH SENSITIVITY)
Troponin I (High Sensitivity): 8 ng/L (ref <18)
Troponin I (High Sensitivity): 8 ng/L (ref <18)

## 2024-04-14 MED ORDER — LORATADINE 10 MG PO TABS
10.0000 mg | ORAL_TABLET | Freq: Every day | ORAL | Status: DC
  Administered 2024-04-15: 10 mg via ORAL
  Filled 2024-04-14: qty 1

## 2024-04-14 MED ORDER — LIDOCAINE HCL (PF) 1 % IJ SOLN
INTRAMUSCULAR | Status: AC
  Filled 2024-04-14: qty 30

## 2024-04-14 MED ORDER — BUSPIRONE HCL 5 MG PO TABS
15.0000 mg | ORAL_TABLET | Freq: Three times a day (TID) | ORAL | Status: DC
  Administered 2024-04-14 – 2024-04-15 (×2): 15 mg via ORAL
  Filled 2024-04-14 (×2): qty 3

## 2024-04-14 MED ORDER — TIOTROPIUM BROMIDE MONOHYDRATE 2.5 MCG/ACT IN AERS: 2.0000 | INHALATION_SPRAY | Freq: Every day | RESPIRATORY_TRACT | Status: DC

## 2024-04-14 MED ORDER — PRASUGREL HCL 10 MG PO TABS
ORAL_TABLET | ORAL | Status: AC
  Filled 2024-04-14: qty 6

## 2024-04-14 MED ORDER — SODIUM CHLORIDE 0.9 % IV SOLN
500.0000 mg | Freq: Once | INTRAVENOUS | Status: AC
  Administered 2024-04-14: 500 mg via INTRAVENOUS
  Filled 2024-04-14: qty 5

## 2024-04-14 MED ORDER — INSULIN ASPART 100 UNIT/ML IJ SOLN: 0.0000 [IU] | Freq: Three times a day (TID) | INTRAMUSCULAR | Status: DC

## 2024-04-14 MED ORDER — NITROGLYCERIN 1 MG/10 ML FOR IR/CATH LAB
INTRA_ARTERIAL | Status: AC
  Filled 2024-04-14: qty 10

## 2024-04-14 MED ORDER — FLUTICASONE FUROATE-VILANTEROL 100-25 MCG/ACT IN AEPB
1.0000 | INHALATION_SPRAY | Freq: Every day | RESPIRATORY_TRACT | Status: DC
  Filled 2024-04-14: qty 28

## 2024-04-14 MED ORDER — IOHEXOL 350 MG/ML SOLN
75.0000 mL | Freq: Once | INTRAVENOUS | Status: AC | PRN
  Administered 2024-04-14: 75 mL via INTRAVENOUS

## 2024-04-14 MED ORDER — PANTOPRAZOLE SODIUM 40 MG PO TBEC
40.0000 mg | DELAYED_RELEASE_TABLET | Freq: Every day | ORAL | Status: DC
  Administered 2024-04-15: 40 mg via ORAL
  Filled 2024-04-14: qty 1

## 2024-04-14 MED ORDER — HEPARIN SODIUM (PORCINE) 1000 UNIT/ML IJ SOLN
INTRAMUSCULAR | Status: DC | PRN
  Administered 2024-04-14 (×2): 4000 [IU] via INTRAVENOUS
  Administered 2024-04-14: 2000 [IU] via INTRAVENOUS
  Administered 2024-04-14: 3000 [IU] via INTRAVENOUS

## 2024-04-14 MED ORDER — HEPARIN SODIUM (PORCINE) 1000 UNIT/ML IJ SOLN
INTRAMUSCULAR | Status: AC
  Filled 2024-04-14: qty 10

## 2024-04-14 MED ORDER — ONDANSETRON HCL 4 MG/2ML IJ SOLN: 4.0000 mg | Freq: Four times a day (QID) | INTRAMUSCULAR | Status: DC | PRN

## 2024-04-14 MED ORDER — ALBUTEROL SULFATE (2.5 MG/3ML) 0.083% IN NEBU: 2.5000 mg | INHALATION_SOLUTION | RESPIRATORY_TRACT | Status: DC | PRN

## 2024-04-14 MED ORDER — LABETALOL HCL 5 MG/ML IV SOLN
10.0000 mg | INTRAVENOUS | Status: AC | PRN
Start: 2024-04-14 — End: 2024-04-15

## 2024-04-14 MED ORDER — MIDAZOLAM HCL 2 MG/2ML IJ SOLN
INTRAMUSCULAR | Status: DC | PRN
  Administered 2024-04-14 (×2): 1 mg via INTRAVENOUS

## 2024-04-14 MED ORDER — TIZANIDINE HCL 4 MG PO TABS
4.0000 mg | ORAL_TABLET | Freq: Three times a day (TID) | ORAL | Status: DC
  Administered 2024-04-14 – 2024-04-15 (×2): 4 mg via ORAL
  Filled 2024-04-14 (×2): qty 1

## 2024-04-14 MED ORDER — FENTANYL CITRATE (PF) 100 MCG/2ML IJ SOLN
INTRAMUSCULAR | Status: DC | PRN
  Administered 2024-04-14 (×4): 25 ug via INTRAVENOUS

## 2024-04-14 MED ORDER — PRASUGREL HCL 10 MG PO TABS
10.0000 mg | ORAL_TABLET | Freq: Every day | ORAL | Status: DC
  Filled 2024-04-14: qty 1

## 2024-04-14 MED ORDER — ENOXAPARIN SODIUM 40 MG/0.4ML IJ SOSY
40.0000 mg | PREFILLED_SYRINGE | INTRAMUSCULAR | Status: DC
Start: 2024-04-15 — End: 2024-04-15
  Filled 2024-04-14: qty 0.4

## 2024-04-14 MED ORDER — NITROGLYCERIN 1 MG/10 ML FOR IR/CATH LAB
INTRA_ARTERIAL | Status: DC | PRN
Start: 2024-04-14 — End: 2024-04-14
  Administered 2024-04-14: 100 ug via INTRACORONARY
  Administered 2024-04-14 (×2): 200 ug via INTRACORONARY

## 2024-04-14 MED ORDER — HEPARIN (PORCINE) IN NACL 2-0.9 UNITS/ML
INTRAMUSCULAR | Status: DC | PRN
  Administered 2024-04-14: 10 mL via INTRA_ARTERIAL

## 2024-04-14 MED ORDER — ISOSORBIDE MONONITRATE ER 30 MG PO TB24
30.0000 mg | ORAL_TABLET | Freq: Every day | ORAL | Status: DC
  Administered 2024-04-15: 30 mg via ORAL
  Filled 2024-04-14: qty 1

## 2024-04-14 MED ORDER — SODIUM CHLORIDE 0.9 % IV BOLUS
500.0000 mL | Freq: Once | INTRAVENOUS | Status: AC
  Administered 2024-04-14: 500 mL via INTRAVENOUS

## 2024-04-14 MED ORDER — SODIUM CHLORIDE 0.9 % IV SOLN: INTRAVENOUS | Status: AC

## 2024-04-14 MED ORDER — FLUTICASONE FUROATE-VILANTEROL 100-25 MCG/ACT IN AEPB
1.0000 | INHALATION_SPRAY | Freq: Every day | RESPIRATORY_TRACT | Status: DC
  Administered 2024-04-14: 1 via RESPIRATORY_TRACT
  Filled 2024-04-14: qty 28

## 2024-04-14 MED ORDER — OXYCODONE HCL 5 MG PO TABS
ORAL_TABLET | ORAL | Status: AC
  Filled 2024-04-14: qty 1

## 2024-04-14 MED ORDER — UMECLIDINIUM BROMIDE 62.5 MCG/ACT IN AEPB
1.0000 | INHALATION_SPRAY | Freq: Every day | RESPIRATORY_TRACT | Status: DC
  Filled 2024-04-14: qty 7

## 2024-04-14 MED ORDER — PRASUGREL HCL 10 MG PO TABS
ORAL_TABLET | ORAL | Status: DC | PRN
  Administered 2024-04-14: 60 mg via ORAL

## 2024-04-14 MED ORDER — INSULIN ASPART 100 UNIT/ML IJ SOLN: 0.0000 [IU] | Freq: Every day | INTRAMUSCULAR | Status: DC

## 2024-04-14 MED ORDER — HEPARIN (PORCINE) IN NACL 1000-0.9 UT/500ML-% IV SOLN
INTRAVENOUS | Status: DC | PRN
  Administered 2024-04-14 (×2): 500 mL via INTRA_ARTERIAL

## 2024-04-14 MED ORDER — OXYCODONE-ACETAMINOPHEN 5-325 MG PO TABS
1.0000 | ORAL_TABLET | Freq: Three times a day (TID) | ORAL | Status: DC | PRN
  Administered 2024-04-14 – 2024-04-15 (×2): 1 via ORAL
  Filled 2024-04-14: qty 1

## 2024-04-14 MED ORDER — HYDRALAZINE HCL 20 MG/ML IJ SOLN: 10.0000 mg | INTRAMUSCULAR | Status: AC | PRN

## 2024-04-14 MED ORDER — SODIUM CHLORIDE 0.9% FLUSH: 3.0000 mL | INTRAVENOUS | Status: DC | PRN

## 2024-04-14 MED ORDER — ASPIRIN 81 MG PO TBEC
81.0000 mg | DELAYED_RELEASE_TABLET | Freq: Every day | ORAL | Status: DC
  Administered 2024-04-15: 81 mg via ORAL
  Filled 2024-04-14: qty 1

## 2024-04-14 MED ORDER — VITAMIN D 25 MCG (1000 UNIT) PO TABS
1000.0000 [IU] | ORAL_TABLET | Freq: Every day | ORAL | Status: DC
  Administered 2024-04-15: 1000 [IU] via ORAL
  Filled 2024-04-14: qty 1

## 2024-04-14 MED ORDER — GABAPENTIN 300 MG PO CAPS
900.0000 mg | ORAL_CAPSULE | Freq: Three times a day (TID) | ORAL | Status: DC
  Administered 2024-04-14 – 2024-04-15 (×2): 900 mg via ORAL
  Filled 2024-04-14 (×2): qty 3

## 2024-04-14 MED ORDER — METOPROLOL TARTRATE 25 MG PO TABS
25.0000 mg | ORAL_TABLET | Freq: Two times a day (BID) | ORAL | Status: DC
  Administered 2024-04-14 – 2024-04-15 (×2): 25 mg via ORAL
  Filled 2024-04-14 (×2): qty 1

## 2024-04-14 MED ORDER — OXYCODONE-ACETAMINOPHEN 5-325 MG PO TABS
ORAL_TABLET | ORAL | Status: AC
  Filled 2024-04-14: qty 1

## 2024-04-14 MED ORDER — OXYCODONE-ACETAMINOPHEN 10-325 MG PO TABS: 1.0000 | ORAL_TABLET | Freq: Three times a day (TID) | ORAL | Status: DC | PRN

## 2024-04-14 MED ORDER — SODIUM CHLORIDE 0.9 % IV SOLN
1.0000 g | Freq: Once | INTRAVENOUS | Status: AC
  Administered 2024-04-14: 1 g via INTRAVENOUS
  Filled 2024-04-14: qty 10

## 2024-04-14 MED ORDER — VERAPAMIL HCL 2.5 MG/ML IV SOLN
INTRAVENOUS | Status: AC
  Filled 2024-04-14: qty 2

## 2024-04-14 MED ORDER — SODIUM CHLORIDE 0.9 % IV SOLN: 250.0000 mL | INTRAVENOUS | Status: DC | PRN

## 2024-04-14 MED ORDER — ATORVASTATIN CALCIUM 80 MG PO TABS: 80.0000 mg | ORAL_TABLET | Freq: Every day | ORAL | Status: DC

## 2024-04-14 MED ORDER — FENTANYL CITRATE (PF) 100 MCG/2ML IJ SOLN
INTRAMUSCULAR | Status: AC
  Filled 2024-04-14: qty 2

## 2024-04-14 MED ORDER — ACETAMINOPHEN 325 MG PO TABS: 650.0000 mg | ORAL_TABLET | ORAL | Status: DC | PRN

## 2024-04-14 MED ORDER — NITROGLYCERIN 0.4 MG SL SUBL: 0.4000 mg | SUBLINGUAL_TABLET | SUBLINGUAL | Status: DC | PRN

## 2024-04-14 MED ORDER — MIDAZOLAM HCL 2 MG/2ML IJ SOLN
INTRAMUSCULAR | Status: AC
  Filled 2024-04-14: qty 2

## 2024-04-14 MED ORDER — DULOXETINE HCL 30 MG PO CPEP
90.0000 mg | ORAL_CAPSULE | Freq: Every day | ORAL | Status: DC
  Administered 2024-04-14: 90 mg via ORAL
  Filled 2024-04-14: qty 3

## 2024-04-14 MED ORDER — FERROUS SULFATE 325 (65 FE) MG PO TABS
325.0000 mg | ORAL_TABLET | Freq: Every day | ORAL | Status: DC
  Administered 2024-04-15: 325 mg via ORAL
  Filled 2024-04-14: qty 1

## 2024-04-14 MED ORDER — SODIUM CHLORIDE 0.9% FLUSH
3.0000 mL | Freq: Two times a day (BID) | INTRAVENOUS | Status: DC
  Administered 2024-04-14 – 2024-04-15 (×2): 3 mL via INTRAVENOUS

## 2024-04-14 MED ORDER — IOHEXOL 350 MG/ML SOLN
INTRAVENOUS | Status: DC | PRN
Start: 2024-04-14 — End: 2024-04-14
  Administered 2024-04-14: 185 mL via INTRA_ARTERIAL

## 2024-04-14 MED ORDER — OXYCODONE HCL 5 MG PO TABS
5.0000 mg | ORAL_TABLET | Freq: Three times a day (TID) | ORAL | Status: DC | PRN
  Administered 2024-04-14 – 2024-04-15 (×2): 5 mg via ORAL
  Filled 2024-04-14: qty 1

## 2024-04-14 NOTE — Interval H&P Note (Signed)
 History and Physical Interval Note:  04/14/2024 4:57 PM  Tanya Lowe  has presented today for surgery, with the diagnosis of unstable angina.  The various methods of treatment have been discussed with the patient and family. After consideration of risks, benefits and other options for treatment, the patient has consented to  Procedure(s): LEFT HEART CATH AND CORONARY ANGIOGRAPHY (N/A) as a surgical intervention.  The patient's history has been reviewed, patient examined, no change in status, stable for surgery.  I have reviewed the patient's chart and labs.  Questions were answered to the patient's satisfaction.    Cath Lab Visit (complete for each Cath Lab visit)  Clinical Evaluation Leading to the Procedure:   ACS: Yes.    Non-ACS:  N/A  Ikaika Showers

## 2024-04-14 NOTE — ED Triage Notes (Signed)
 Pt coming in from dr office with chest pain that started 1.5 hrs ago central not reproducible radiating to back Pt has a hx of MI with atypical presentation. Pt wears 3L at baseline. Ems gave  0.4 nitro 324 asa  Pt has a 20 lac   Bp 110/76 Pulse 70  Rr 24  93% 3l  89cbg

## 2024-04-14 NOTE — H&P (View-Only) (Signed)
 Cardiology Consultation   Patient ID: Tanya Lowe MRN: [id number]; DOB: [birth year]  Admit date: 04/14/2024 Date of Consult: 04/14/2024  PCP:  Halbert Mariano SQUIBB, DO   [facility] Providers Cardiologist:  Alvan Carrier, MD        Patient Profile: Tanya Lowe is a 53 y.o. female with a hx of CAD who is being seen 04/14/2024 for the evaluation of chest pain at the request of Dr. Francesca.  History of Present Illness: Tanya Lowe 53 year old female with history of coronary disease with prior non-STEMI in 2016 status post drug-eluting stent to mid RCA and drug-eluting stent to the distal RCA as well as drug-eluting stent to the mid LAD who underwent recent myocardial perfusion study on 10/28/2023 that showed evidence of prior anterior infarction with very mild peri-infarct ischemia overall felt to be low risk with ejection fraction in the 65% range here for chest discomfort.  She has had some central chest pain rating to her back since earlier this morning when she was walking down a long hallway in the primary care office earlier this morning felt short of breath.  They called EMS and finally the chest pain resolved but while in the ER started to recur again.  Laying fairly comfortably in bed however was nervous about prior incident.  Her husband is at bedside.    She sees Dr. Darlean for COPD on home O2 3 L with PFTs in 2023 showing severe diffusion defect Past Medical History:  Diagnosis Date   Arthritis    Bilateral hand pain    CAD (coronary artery disease)    a. s/p NSTEMI in 2016 with DES to mid-RCA, DES to [facility] and DES to mid-LAD b. low-risk NST in 05/2016 and 11/2018   Cancer (HCC)    Skin   Cervical radiculopathy    CHF (congestive heart failure) (HCC)    Chronic neck and back pain    Diabetes mellitus without complication (HCC)    Emphysema lung (HCC)    Fibromyalgia    Hypertension    Kidney stones    hx of w previous lithotripsy   MI (myocardial  infarction) (HCC)    Pain management    [facility]   Thoracic back pain     Past Surgical History:  Procedure Laterality Date   CARDIAC SURGERY     CYSTOSCOPY WITH RETROGRADE PYELOGRAM, URETEROSCOPY AND STENT PLACEMENT Left 03/14/2015   Procedure: CYSTOSCOPY WITH LEFT RETROGRADE PYELOGRAM;  Surgeon: Glendia Elizabeth, MD;  Location: [facility];  Service: Urology;  Laterality: Left;   CYSTOSCOPY WITH RETROGRADE PYELOGRAM, URETEROSCOPY AND STENT PLACEMENT Left 03/20/2015   Procedure: CYSTOSCOPY WITH LEFT  RETROGRADE LEFT URETEROSCOPY AND LEFT  STENT ;  Surgeon: Norleen Seltzer, MD;  Location: [facility];  Service: Urology;  Laterality: Left;   HOLMIUM LASER APPLICATION Left 03/20/2015   Procedure: HOLMIUM LASER APPLICATION;  Surgeon: Norleen Seltzer, MD;  Location: [facility];  Service: Urology;  Laterality: Left;   LEFT HEART CATHETERIZATION WITH CORONARY ANGIOGRAM N/A 10/25/2014   Procedure: LEFT HEART CATHETERIZATION WITH CORONARY ANGIOGRAM;  Surgeon: Lonni JONETTA Cash, MD;  Location: [facility] CATH LAB;  Service: Cardiovascular;  Laterality: N/A;   LITHOTRIPSY  about 2011   SPINAL FUSION     L5-S1   TUBAL LIGATION       Home Medications:  Prior to Admission medications   Medication Sig Start Date End Date Taking? Authorizing Provider  albuterol  (VENTOLIN  HFA) 108 (90 Base) MCG/ACT inhaler Inhale 2 puffs into the lungs  every 4 (four) hours as needed for shortness of breath or wheezing. 12/21/23   [provider]  aspirin  EC 81 MG tablet Take 81 mg by mouth daily.    [provider]  atorvastatin  (LIPITOR ) 80 MG tablet Take 1 tablet (80 mg total) by mouth daily at 6 PM. 10/26/14   Maryjo Dorise ORN, PA-C  budesonide -formoterol  (SYMBICORT ) 80-4.5 MCG/ACT inhaler Take 2 puffs first thing in am and then another 2 puffs about 12 hours later. 05/01/23   Darlean Ozell NOVAK, MD  busPIRone  (BUSPAR ) 7.5 MG tablet Take 15 mg by mouth 3 (three) times daily.    [provider]  cholecalciferol (VITAMIN D3) 25 MCG  (1000 UNIT) tablet Take 1,000 Units by mouth daily.    [provider]  DULoxetine  (CYMBALTA ) 30 MG capsule Take 30 mg by mouth daily. Take with 60 mg to equal 90 mg 07/07/22   [provider]  DULoxetine  (CYMBALTA ) 60 MG capsule Take 60 mg by mouth daily. Take with 30 mg to equal 90 mg    [provider]  FEROSUL 325 (65 Fe) MG tablet Take 325 mg by mouth every other day. 06/30/22   [provider]  fexofenadine (ALLEGRA) 180 MG tablet Take 180 mg by mouth daily.    [provider]  fluticasone (FLONASE) 50 MCG/ACT nasal spray Place 2 sprays into both nostrils daily as needed for allergies.  07/28/17   [provider]  furosemide  (LASIX ) 40 MG tablet Take 1 tablet (40 mg total) by mouth daily. 09/13/23 03/24/24  Bryn Bernardino NOVAK, MD  gabapentin  (NEURONTIN ) 300 MG capsule Take 900 mg by mouth 3 (three) times daily.    [provider]  isosorbide  mononitrate (IMDUR ) 30 MG 24 hr tablet TAKE ONE TABLET BY MOUTH ONCE DAILY 07/20/23   Strader, [place] M, PA-C  metFORMIN (GLUCOPHAGE) 500 MG tablet Take 500 mg by mouth daily with breakfast. 04/30/20   [provider]  metoprolol  tartrate (LOPRESSOR ) 25 MG tablet Take 1 tablet (25 mg total) by mouth 2 (two) times daily. 03/24/24   Strader, Laymon HERO, PA-C  metroNIDAZOLE (METROCREAM) 0.75 % cream Apply 1 Application topically 2 (two) times daily. 08/11/23   [provider]  nitroGLYCERIN  (NITROSTAT ) 0.4 MG SL tablet Place 1 tablet (0.4 mg total) under the tongue every 5 (five) minutes as needed for chest pain. 11/28/15   Alvan Dorn FALCON, MD  omeprazole  (PRILOSEC) 20 MG capsule Take 20 mg by mouth daily. 03/09/22   [provider]  oxyCODONE -acetaminophen  (PERCOCET) 10-325 MG per tablet Take 1 tablet by mouth every 8 (eight) hours as needed for pain.    [provider]  Tiotropium Bromide Monohydrate  (SPIRIVA  RESPIMAT) 2.5 MCG/ACT AERS Inhale 2 puffs into the lungs daily.  10/22/23   Darlean Ozell NOVAK, MD  tiZANidine  (ZANAFLEX ) 4 MG tablet Take 4 mg by mouth 3 (three) times daily. 02/22/15   [provider]    Scheduled Meds:  Continuous Infusions:  PRN Meds:   Allergies:    Allergies  Allergen Reactions   Lyrica [Pregabalin] Shortness Of Breath    Chest Pain    Bactrim  [Sulfamethoxazole -Trimethoprim ] Rash   Prednisone Palpitations and Hypertension         Social History:   Social History   Socioeconomic History   Marital status: Divorced    Spouse name: Not on file   Number of children: Not on file   Years of education: Not on file   Highest education level: Not  on file  Occupational History   Not on file  Tobacco Use   Smoking status: Every Day    Current packs/day: 0.75    Average packs/day: 0.7 packs/day for 33.5 years (25.1 ttl pk-yrs)    Types: Cigarettes    Start date: 10/25/1990   Smokeless tobacco: Never   Tobacco comments:    12 cigarettes a day.  Vaping Use   Vaping status: Some Days   Substances: Nicotine   Substance and Sexual Activity   Alcohol use: No    Alcohol/week: 0.0 standard drinks of alcohol   Drug use: No   Sexual activity: Not on file  Other Topics Concern   Not on file  Social History Narrative   Not on file   Social Drivers of Health   Financial Resource Strain: Not on file  Food Insecurity: No Food Insecurity (09/10/2023)   Hunger Vital Sign    Worried About Running Out of Food in the Last Year: Never true    Ran Out of Food in the Last Year: Never true  Transportation Needs: No Transportation Needs (09/10/2023)   PRAPARE - Administrator, Civil Service (Medical): No    Lack of Transportation (Non-Medical): No  Physical Activity: Not on file  Stress: Not on file  Social Connections: Not on file  Intimate Partner Violence: Not At Risk (09/10/2023)   Humiliation, Afraid, Rape, and Kick questionnaire    Fear of Current or Ex-Partner: No    Emotionally Abused: No    Physically  Abused: No    Sexually Abused: No    Family History:    Family History  Problem Relation Age of Onset   Diabetes Mother    Stroke Mother    Seizures Other    Cancer Other    Diabetes Other      ROS:  Please see the history of present illness.   All other ROS reviewed and negative.     Physical Exam/Data: Vitals:   04/14/24 1500 04/14/24 1515 04/14/24 1530 04/14/24 1600  BP: 107/74 102/78 121/87 105/66  Pulse: 65 65 68 68  Resp: 13 14 20  (!) 21  Temp:      TempSrc:      SpO2: 96% 98% 98% 98%  Weight:      Height:        Intake/Output Summary (Last 24 hours) at 04/14/2024 1634 Last data filed at 04/14/2024 1554 Gross per 24 hour  Intake 693.29 ml  Output --  Net 693.29 ml      04/14/2024   10:23 AM 03/24/2024    2:33 PM 12/31/2023    9:32 AM  Last 3 Weights  Weight (lbs) 175 lb 14.8 oz 176 lb 178 lb 6.4 oz  Weight (kg) 79.8 kg 79.833 kg 80.922 kg     Body mass index is 31.66 kg/m.  General:  Well nourished, well developed, in no acute distress HEENT: normal Neck: no JVD Vascular: No carotid bruits; Distal pulses 2+ bilaterally Cardiac:  normal S1, S2; RRR; no murmur  Lungs:  clear to auscultation bilaterally, no wheezing, rhonchi or rales  Abd: soft, nontender, no hepatomegaly  Ext: no edema Musculoskeletal:  No deformities, BUE and BLE strength normal and equal Skin: warm and dry  Neuro:  CNs 2-12 intact, no focal abnormalities noted Psych:  Normal affect   EKG:  The EKG was personally reviewed and demonstrates: Slightly worsening ST depression/T wave inversion in multiple leads. Telemetry:  Telemetry was personally reviewed and demonstrates: No  adverse arrhythmias  Relevant CV Studies: Recent nuclear stress test as above in January with peri-infarct ischemia anterior infarct pattern normal EF  Laboratory Data: High Sensitivity Troponin:   Recent Labs  Lab 04/14/24 1021 04/14/24 1227  TROPONINIHS 8 8     Chemistry Recent Labs  Lab 04/14/24 1021   NA 139  K 4.2  CL 104  CO2 25  GLUCOSE 97  BUN 10  CREATININE 0.78  CALCIUM  9.0  GFRNONAA >60  ANIONGAP 10    No results for input(s): PROT, ALBUMIN , AST, ALT, ALKPHOS, BILITOT in the last 168 hours. Lipids No results for input(s): CHOL, TRIG, HDL, LABVLDL, LDLCALC, CHOLHDL in the last 168 hours.  Hematology Recent Labs  Lab 04/14/24 1021  WBC 8.6  RBC 4.07  HGB 11.9*  HCT 37.0  MCV 90.9  MCH 29.2  MCHC 32.2  RDW 14.9  PLT 213   Thyroid No results for input(s): TSH, FREET4 in the last 168 hours.  BNPNo results for input(s): BNP, PROBNP in the last 168 hours.  DDimer No results for input(s): DDIMER in the last 168 hours.  Radiology/Studies:  CT Angio Chest/Abd/Pel for Dissection W and/or W/WO Result Date: 04/14/2024 CLINICAL DATA:  Acute aortic syndrome (AAS) suspected.  Chest pain. EXAM: CT ANGIOGRAPHY CHEST, ABDOMEN AND PELVIS TECHNIQUE: Non-contrast CT of the chest was initially obtained. Multidetector CT imaging through the chest, abdomen and pelvis was performed using the standard protocol during bolus administration of intravenous contrast. Multiplanar reconstructed images and MIPs were obtained and reviewed to evaluate the vascular anatomy. RADIATION DOSE REDUCTION: This exam was performed according to the departmental dose-optimization program which includes automated exposure control, adjustment of the mA and/or kV according to patient size and/or use of iterative reconstruction technique. CONTRAST:  75mL OMNIPAQUE  IOHEXOL  350 MG/ML SOLN COMPARISON:  CT angiography chest from 09/10/2023 and PET-CT scan from 07/31/2022. FINDINGS: CTA CHEST FINDINGS Cardiovascular: No intramural hematoma noted in the thoracic aorta on the unenhanced images. Thoracic aorta is normal in caliber without aneurysm, dissection, vasculitis or significant stenosis. There is also satisfactory opacification of bilateral pulmonary arteries. There is no embolism to the  proximal subsegmental pulmonary artery level. There is dilation of the main pulmonary trunk measuring up to 3.1 cm, which is nonspecific but can be seen with pulmonary artery hypertension. Normal cardiac size. No pericardial effusion. No aortic aneurysm. There are coronary artery calcifications, in keeping with coronary artery disease. There are also mild peripheral atherosclerotic vascular calcifications of thoracic aorta and its major branches. Mediastinum/Nodes: Visualized thyroid gland appears grossly unremarkable. No solid / cystic mediastinal masses. The esophagus is nondistended precluding optimal assessment. Redemonstration of multiple enlarged bilateral hilar and mediastinal lymph nodes for example left prevascular lymph node measuring up to 1.6 x 2.8 cm and subcarinal lymph node measuring up to 1.6 x 2.3 cm. These are essentially unchanged since the prior study. No axillary lymphadenopathy by size criteria. Lungs/Pleura: The central tracheo-bronchial tree is patent. Redemonstration of diffuse ground-glass changes throughout bilateral lungs, essentially similar to the prior study. There is mosaic attenuation of lungs, consistent with heterogeneous air trapping related to small airways disease. Redemonstration of peripheral reticulations and honeycombing in the anterior bilateral upper lobes. There is an irregular lobulated pleural based at least 1.3 x 1.3 cm opacity in the right lung lower lobe, which is new since the prior study. This is most likely infective/inflammatory in etiology; however, short-term follow-up examination is recommended in 4-6 weeks to exclude underlying neoplastic process. No pleural effusion or  pneumothorax. No lung mass, collapse or consolidation. Musculoskeletal: The visualized soft tissues of the chest wall are grossly unremarkable. No suspicious osseous lesions. There are mild multilevel degenerative changes in the visualized spine. Neurostimulator device noted with its lead  along the posterior aspect of the spinal canal at T7-T8 level. Review of the MIP images confirms the above findings. CTA ABDOMEN AND PELVIS FINDINGS VASCULAR Aorta: Normal caliber aorta without aneurysm, dissection, vasculitis or significant stenosis. Celiac: Patent without evidence of aneurysm, dissection, vasculitis or significant stenosis. SMA: Patent without evidence of aneurysm, dissection, vasculitis or significant stenosis. Renals: Both renal arteries are patent without evidence of aneurysm, dissection, vasculitis, fibromuscular dysplasia or significant stenosis. IMA: Patent without evidence of aneurysm, dissection, vasculitis or significant stenosis. Inflow: Patent without evidence of aneurysm, dissection, vasculitis or significant stenosis. Veins: No obvious venous abnormality within the limitations of this arterial phase study. Review of the MIP images confirms the above findings. NON-VASCULAR Hepatobiliary: The liver is normal in size. Non-cirrhotic configuration. No suspicious mass. No intrahepatic or extrahepatic bile duct dilation. No calcified gallstones. Normal gallbladder wall thickness. No pericholecystic inflammatory changes. Pancreas: Unremarkable. No pancreatic ductal dilatation or surrounding inflammatory changes. Spleen: Within normal limits. No focal lesion. Adrenals/Urinary Tract: Adrenal glands are unremarkable. No suspicious renal mass. No hydronephrosis. No renal or ureteric calculi. Unremarkable urinary bladder. Stomach/Bowel: No disproportionate dilation of the small or large bowel loops. No evidence of abnormal bowel wall thickening or inflammatory changes. The appendix is unremarkable. Vascular/Lymphatic: No ascites or pneumoperitoneum. No abdominal or pelvic lymphadenopathy, by size criteria. No aneurysmal dilation of the major abdominal arteries. There are moderate peripheral atherosclerotic vascular calcifications of the aorta and its major branches. Reproductive: The uterus is  unremarkable. No large adnexal mass. Other: There is a tiny fat containing umbilical hernia. The soft tissues and abdominal wall are otherwise unremarkable. Musculoskeletal: No suspicious osseous lesions. There are mild multilevel degenerative changes in the visualized spine. L4-5 posterior spinal fixation noted. Review of the MIP images confirms the above findings. IMPRESSION: 1. No acute aortic syndrome. No thoracic or abdominal aortic aneurysm, dissection or penetrating atherosclerotic ulcer. 2. No acute inflammatory process identified within the abdomen or pelvis. 3. There is an irregular lobulated pleural based at least 1.3 x 1.3 cm opacity in the right lung lower lobe, which is new since the prior study. Short-term follow-up examination is recommended in 4-6 weeks. 4. Redemonstration of mediastinal and hilar lymphadenopathy, without significant interval change. 5. Multiple other nonacute observations, as described above. Aortic Atherosclerosis (ICD10-I70.0). Electronically Signed   By: Ree Molt M.D.   On: 04/14/2024 13:45   DG Chest 2 View Result Date: 04/14/2024 CLINICAL DATA:  Chest pain. EXAM: CHEST - 2 VIEW COMPARISON:  September 10, 2019 FINDINGS: The heart size and mediastinal contours are within normal limits. There is mild to moderate severity calcification of the aortic arch. Mild, diffuse, chronic appearing increased interstitial lung markings are seen. There is no evidence of focal consolidation, pleural effusion or pneumothorax. Stable spinal stimulator wire positioning is noted. The visualized skeletal structures are unremarkable. IMPRESSION: Chronic appearing increased interstitial lung markings without evidence of acute or active cardiopulmonary disease. Electronically Signed   By: Suzen Dials M.D.   On: 04/14/2024 11:43     Assessment and Plan:  53 year old with coronary disease multiple stents as described above here with chest discomfort/unstable anginal type symptoms.   -Recent nuclear stress test about 5 months ago showed anterior infarct pattern with mild peri-infarct ischemia felt to be overall  low risk. -Now coming in with recurrent chest discomfort.  Has had chest pain radiating to her jaw in the past according to prior office notes reviewed from .  Thankfully troponin at this point is normal.  EKG does show some accentuation of her ST-T wave changes with accentuated T wave inversion fairly diffuse. -Given her recurrence of symptoms with fairly significant coronary anatomy, cardiac catheterization will be pursued.  Risks and benefits of been explained including stroke heart attack death renal impairment bleeding.  She is willing to proceed.  Husband in room as well.-Anginal symptoms have been present despite metoprolol  25 mg twice a day and isosorbide  30 mg a day   COPD 3 L home O2 - Dr. 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  Prior VQ scan negative.  Hyperlipidemia - Prior LDL 28 on atorvastatin  80 mg.  Excellent.  Hypertension - Well-controlled on metoprolol  25 mg twice a day as well as isosorbide  30 mg a day.   Risk Assessment/Risk Scores:    TIMI Risk Score for Unstable Angina or Non-ST Elevation MI:   The patient's TIMI risk score is 5, which indicates a 26% risk of all cause mortality, new or recurrent myocardial infarction or need for urgent revascularization in the next 14 days.       For questions or updates, please contact [facility] Please consult [email or web address] for contact info under    Signed, Oneil Parchment, MD  04/14/2024 4:34 PM

## 2024-04-14 NOTE — Consult Note (Signed)
 Cardiology Consultation   Patient ID: Tanya Lowe MRN: [id number]; DOB: [birth year]  Admit date: 04/14/2024 Date of Consult: 04/14/2024  PCP:  Halbert Mariano SQUIBB, DO   [facility] Providers Cardiologist:  Alvan Carrier, MD        Patient Profile: Tanya Lowe is a 53 y.o. female with a hx of CAD who is being seen 04/14/2024 for the evaluation of chest pain at the request of Dr. Francesca.  History of Present Illness: Ms. Tanya Lowe 53 year old female with history of coronary disease with prior non-STEMI in 2016 status post drug-eluting stent to mid RCA and drug-eluting stent to the distal RCA as well as drug-eluting stent to the mid LAD who underwent recent myocardial perfusion study on 10/28/2023 that showed evidence of prior anterior infarction with very mild peri-infarct ischemia overall felt to be low risk with ejection fraction in the 65% range here for chest discomfort.  She has had some central chest pain rating to her back since earlier this morning when she was walking down a long hallway in the primary care office earlier this morning felt short of breath.  They called EMS and finally the chest pain resolved but while in the ER started to recur again.  Laying fairly comfortably in bed however was nervous about prior incident.  Her husband is at bedside.    She sees Dr. Darlean for COPD on home O2 3 L with PFTs in 2023 showing severe diffusion defect Past Medical History:  Diagnosis Date   Arthritis    Bilateral hand pain    CAD (coronary artery disease)    a. s/p NSTEMI in 2016 with DES to mid-RCA, DES to [facility] and DES to mid-LAD b. low-risk NST in 05/2016 and 11/2018   Cancer (HCC)    Skin   Cervical radiculopathy    CHF (congestive heart failure) (HCC)    Chronic neck and back pain    Diabetes mellitus without complication (HCC)    Emphysema lung (HCC)    Fibromyalgia    Hypertension    Kidney stones    hx of w previous lithotripsy   MI (myocardial  infarction) (HCC)    Pain management    [facility]   Thoracic back pain     Past Surgical History:  Procedure Laterality Date   CARDIAC SURGERY     CYSTOSCOPY WITH RETROGRADE PYELOGRAM, URETEROSCOPY AND STENT PLACEMENT Left 03/14/2015   Procedure: CYSTOSCOPY WITH LEFT RETROGRADE PYELOGRAM;  Surgeon: Glendia Elizabeth, MD;  Location: [facility];  Service: Urology;  Laterality: Left;   CYSTOSCOPY WITH RETROGRADE PYELOGRAM, URETEROSCOPY AND STENT PLACEMENT Left 03/20/2015   Procedure: CYSTOSCOPY WITH LEFT  RETROGRADE LEFT URETEROSCOPY AND LEFT  STENT ;  Surgeon: Norleen Seltzer, MD;  Location: [facility];  Service: Urology;  Laterality: Left;   HOLMIUM LASER APPLICATION Left 03/20/2015   Procedure: HOLMIUM LASER APPLICATION;  Surgeon: Norleen Seltzer, MD;  Location: [facility];  Service: Urology;  Laterality: Left;   LEFT HEART CATHETERIZATION WITH CORONARY ANGIOGRAM N/A 10/25/2014   Procedure: LEFT HEART CATHETERIZATION WITH CORONARY ANGIOGRAM;  Surgeon: Lonni JONETTA Cash, MD;  Location: [facility] CATH LAB;  Service: Cardiovascular;  Laterality: N/A;   LITHOTRIPSY  about 2011   SPINAL FUSION     L5-S1   TUBAL LIGATION       Home Medications:  Prior to Admission medications   Medication Sig Start Date End Date Taking? Authorizing Provider  albuterol  (VENTOLIN  HFA) 108 (90 Base) MCG/ACT inhaler Inhale 2 puffs into the lungs  every 4 (four) hours as needed for shortness of breath or wheezing. 12/21/23   [provider]  aspirin  EC 81 MG tablet Take 81 mg by mouth daily.    [provider]  atorvastatin  (LIPITOR ) 80 MG tablet Take 1 tablet (80 mg total) by mouth daily at 6 PM. 10/26/14   Maryjo Dorise ORN, PA-C  budesonide -formoterol  (SYMBICORT ) 80-4.5 MCG/ACT inhaler Take 2 puffs first thing in am and then another 2 puffs about 12 hours later. 05/01/23   Darlean Ozell NOVAK, MD  busPIRone  (BUSPAR ) 7.5 MG tablet Take 15 mg by mouth 3 (three) times daily.    [provider]  cholecalciferol (VITAMIN D3) 25 MCG  (1000 UNIT) tablet Take 1,000 Units by mouth daily.    [provider]  DULoxetine  (CYMBALTA ) 30 MG capsule Take 30 mg by mouth daily. Take with 60 mg to equal 90 mg 07/07/22   [provider]  DULoxetine  (CYMBALTA ) 60 MG capsule Take 60 mg by mouth daily. Take with 30 mg to equal 90 mg    [provider]  FEROSUL 325 (65 Fe) MG tablet Take 325 mg by mouth every other day. 06/30/22   [provider]  fexofenadine (ALLEGRA) 180 MG tablet Take 180 mg by mouth daily.    [provider]  fluticasone (FLONASE) 50 MCG/ACT nasal spray Place 2 sprays into both nostrils daily as needed for allergies.  07/28/17   [provider]  furosemide  (LASIX ) 40 MG tablet Take 1 tablet (40 mg total) by mouth daily. 09/13/23 03/24/24  Bryn Bernardino NOVAK, MD  gabapentin  (NEURONTIN ) 300 MG capsule Take 900 mg by mouth 3 (three) times daily.    [provider]  isosorbide  mononitrate (IMDUR ) 30 MG 24 hr tablet TAKE ONE TABLET BY MOUTH ONCE DAILY 07/20/23   Strader, [place] M, PA-C  metFORMIN (GLUCOPHAGE) 500 MG tablet Take 500 mg by mouth daily with breakfast. 04/30/20   [provider]  metoprolol  tartrate (LOPRESSOR ) 25 MG tablet Take 1 tablet (25 mg total) by mouth 2 (two) times daily. 03/24/24   Strader, Laymon HERO, PA-C  metroNIDAZOLE (METROCREAM) 0.75 % cream Apply 1 Application topically 2 (two) times daily. 08/11/23   [provider]  nitroGLYCERIN  (NITROSTAT ) 0.4 MG SL tablet Place 1 tablet (0.4 mg total) under the tongue every 5 (five) minutes as needed for chest pain. 11/28/15   Alvan Dorn FALCON, MD  omeprazole  (PRILOSEC) 20 MG capsule Take 20 mg by mouth daily. 03/09/22   [provider]  oxyCODONE -acetaminophen  (PERCOCET) 10-325 MG per tablet Take 1 tablet by mouth every 8 (eight) hours as needed for pain.    [provider]  Tiotropium Bromide Monohydrate  (SPIRIVA  RESPIMAT) 2.5 MCG/ACT AERS Inhale 2 puffs into the lungs daily.  10/22/23   Darlean Ozell NOVAK, MD  tiZANidine  (ZANAFLEX ) 4 MG tablet Take 4 mg by mouth 3 (three) times daily. 02/22/15   [provider]    Scheduled Meds:  Continuous Infusions:  PRN Meds:   Allergies:    Allergies  Allergen Reactions   Lyrica [Pregabalin] Shortness Of Breath    Chest Pain    Bactrim  [Sulfamethoxazole -Trimethoprim ] Rash   Prednisone Palpitations and Hypertension         Social History:   Social History   Socioeconomic History   Marital status: Divorced    Spouse name: Not on file   Number of children: Not on file   Years of education: Not on file   Highest education level: Not  on file  Occupational History   Not on file  Tobacco Use   Smoking status: Every Day    Current packs/day: 0.75    Average packs/day: 0.7 packs/day for 33.5 years (25.1 ttl pk-yrs)    Types: Cigarettes    Start date: 10/25/1990   Smokeless tobacco: Never   Tobacco comments:    12 cigarettes a day.  Vaping Use   Vaping status: Some Days   Substances: Nicotine   Substance and Sexual Activity   Alcohol use: No    Alcohol/week: 0.0 standard drinks of alcohol   Drug use: No   Sexual activity: Not on file  Other Topics Concern   Not on file  Social History Narrative   Not on file   Social Drivers of Health   Financial Resource Strain: Not on file  Food Insecurity: No Food Insecurity (09/10/2023)   Hunger Vital Sign    Worried About Running Out of Food in the Last Year: Never true    Ran Out of Food in the Last Year: Never true  Transportation Needs: No Transportation Needs (09/10/2023)   PRAPARE - Administrator, Civil Service (Medical): No    Lack of Transportation (Non-Medical): No  Physical Activity: Not on file  Stress: Not on file  Social Connections: Not on file  Intimate Partner Violence: Not At Risk (09/10/2023)   Humiliation, Afraid, Rape, and Kick questionnaire    Fear of Current or Ex-Partner: No    Emotionally Abused: No    Physically  Abused: No    Sexually Abused: No    Family History:    Family History  Problem Relation Age of Onset   Diabetes Mother    Stroke Mother    Seizures Other    Cancer Other    Diabetes Other      ROS:  Please see the history of present illness.   All other ROS reviewed and negative.     Physical Exam/Data: Vitals:   04/14/24 1500 04/14/24 1515 04/14/24 1530 04/14/24 1600  BP: 107/74 102/78 121/87 105/66  Pulse: 65 65 68 68  Resp: 13 14 20  (!) 21  Temp:      TempSrc:      SpO2: 96% 98% 98% 98%  Weight:      Height:        Intake/Output Summary (Last 24 hours) at 04/14/2024 1634 Last data filed at 04/14/2024 1554 Gross per 24 hour  Intake 693.29 ml  Output --  Net 693.29 ml      04/14/2024   10:23 AM 03/24/2024    2:33 PM 12/31/2023    9:32 AM  Last 3 Weights  Weight (lbs) 175 lb 14.8 oz 176 lb 178 lb 6.4 oz  Weight (kg) 79.8 kg 79.833 kg 80.922 kg     Body mass index is 31.66 kg/m.  General:  Well nourished, well developed, in no acute distress HEENT: normal Neck: no JVD Vascular: No carotid bruits; Distal pulses 2+ bilaterally Cardiac:  normal S1, S2; RRR; no murmur  Lungs:  clear to auscultation bilaterally, no wheezing, rhonchi or rales  Abd: soft, nontender, no hepatomegaly  Ext: no edema Musculoskeletal:  No deformities, BUE and BLE strength normal and equal Skin: warm and dry  Neuro:  CNs 2-12 intact, no focal abnormalities noted Psych:  Normal affect   EKG:  The EKG was personally reviewed and demonstrates: Slightly worsening ST depression/T wave inversion in multiple leads. Telemetry:  Telemetry was personally reviewed and demonstrates: No  adverse arrhythmias  Relevant CV Studies: Recent nuclear stress test as above in January with peri-infarct ischemia anterior infarct pattern normal EF  Laboratory Data: High Sensitivity Troponin:   Recent Labs  Lab 04/14/24 1021 04/14/24 1227  TROPONINIHS 8 8     Chemistry Recent Labs  Lab 04/14/24 1021   NA 139  K 4.2  CL 104  CO2 25  GLUCOSE 97  BUN 10  CREATININE 0.78  CALCIUM  9.0  GFRNONAA >60  ANIONGAP 10    No results for input(s): PROT, ALBUMIN , AST, ALT, ALKPHOS, BILITOT in the last 168 hours. Lipids No results for input(s): CHOL, TRIG, HDL, LABVLDL, LDLCALC, CHOLHDL in the last 168 hours.  Hematology Recent Labs  Lab 04/14/24 1021  WBC 8.6  RBC 4.07  HGB 11.9*  HCT 37.0  MCV 90.9  MCH 29.2  MCHC 32.2  RDW 14.9  PLT 213   Thyroid No results for input(s): TSH, FREET4 in the last 168 hours.  BNPNo results for input(s): BNP, PROBNP in the last 168 hours.  DDimer No results for input(s): DDIMER in the last 168 hours.  Radiology/Studies:  CT Angio Chest/Abd/Pel for Dissection W and/or W/WO Result Date: 04/14/2024 CLINICAL DATA:  Acute aortic syndrome (AAS) suspected.  Chest pain. EXAM: CT ANGIOGRAPHY CHEST, ABDOMEN AND PELVIS TECHNIQUE: Non-contrast CT of the chest was initially obtained. Multidetector CT imaging through the chest, abdomen and pelvis was performed using the standard protocol during bolus administration of intravenous contrast. Multiplanar reconstructed images and MIPs were obtained and reviewed to evaluate the vascular anatomy. RADIATION DOSE REDUCTION: This exam was performed according to the departmental dose-optimization program which includes automated exposure control, adjustment of the mA and/or kV according to patient size and/or use of iterative reconstruction technique. CONTRAST:  75mL OMNIPAQUE  IOHEXOL  350 MG/ML SOLN COMPARISON:  CT angiography chest from 09/10/2023 and PET-CT scan from 07/31/2022. FINDINGS: CTA CHEST FINDINGS Cardiovascular: No intramural hematoma noted in the thoracic aorta on the unenhanced images. Thoracic aorta is normal in caliber without aneurysm, dissection, vasculitis or significant stenosis. There is also satisfactory opacification of bilateral pulmonary arteries. There is no embolism to the  proximal subsegmental pulmonary artery level. There is dilation of the main pulmonary trunk measuring up to 3.1 cm, which is nonspecific but can be seen with pulmonary artery hypertension. Normal cardiac size. No pericardial effusion. No aortic aneurysm. There are coronary artery calcifications, in keeping with coronary artery disease. There are also mild peripheral atherosclerotic vascular calcifications of thoracic aorta and its major branches. Mediastinum/Nodes: Visualized thyroid gland appears grossly unremarkable. No solid / cystic mediastinal masses. The esophagus is nondistended precluding optimal assessment. Redemonstration of multiple enlarged bilateral hilar and mediastinal lymph nodes for example left prevascular lymph node measuring up to 1.6 x 2.8 cm and subcarinal lymph node measuring up to 1.6 x 2.3 cm. These are essentially unchanged since the prior study. No axillary lymphadenopathy by size criteria. Lungs/Pleura: The central tracheo-bronchial tree is patent. Redemonstration of diffuse ground-glass changes throughout bilateral lungs, essentially similar to the prior study. There is mosaic attenuation of lungs, consistent with heterogeneous air trapping related to small airways disease. Redemonstration of peripheral reticulations and honeycombing in the anterior bilateral upper lobes. There is an irregular lobulated pleural based at least 1.3 x 1.3 cm opacity in the right lung lower lobe, which is new since the prior study. This is most likely infective/inflammatory in etiology; however, short-term follow-up examination is recommended in 4-6 weeks to exclude underlying neoplastic process. No pleural effusion or  pneumothorax. No lung mass, collapse or consolidation. Musculoskeletal: The visualized soft tissues of the chest wall are grossly unremarkable. No suspicious osseous lesions. There are mild multilevel degenerative changes in the visualized spine. Neurostimulator device noted with its lead  along the posterior aspect of the spinal canal at T7-T8 level. Review of the MIP images confirms the above findings. CTA ABDOMEN AND PELVIS FINDINGS VASCULAR Aorta: Normal caliber aorta without aneurysm, dissection, vasculitis or significant stenosis. Celiac: Patent without evidence of aneurysm, dissection, vasculitis or significant stenosis. SMA: Patent without evidence of aneurysm, dissection, vasculitis or significant stenosis. Renals: Both renal arteries are patent without evidence of aneurysm, dissection, vasculitis, fibromuscular dysplasia or significant stenosis. IMA: Patent without evidence of aneurysm, dissection, vasculitis or significant stenosis. Inflow: Patent without evidence of aneurysm, dissection, vasculitis or significant stenosis. Veins: No obvious venous abnormality within the limitations of this arterial phase study. Review of the MIP images confirms the above findings. NON-VASCULAR Hepatobiliary: The liver is normal in size. Non-cirrhotic configuration. No suspicious mass. No intrahepatic or extrahepatic bile duct dilation. No calcified gallstones. Normal gallbladder wall thickness. No pericholecystic inflammatory changes. Pancreas: Unremarkable. No pancreatic ductal dilatation or surrounding inflammatory changes. Spleen: Within normal limits. No focal lesion. Adrenals/Urinary Tract: Adrenal glands are unremarkable. No suspicious renal mass. No hydronephrosis. No renal or ureteric calculi. Unremarkable urinary bladder. Stomach/Bowel: No disproportionate dilation of the small or large bowel loops. No evidence of abnormal bowel wall thickening or inflammatory changes. The appendix is unremarkable. Vascular/Lymphatic: No ascites or pneumoperitoneum. No abdominal or pelvic lymphadenopathy, by size criteria. No aneurysmal dilation of the major abdominal arteries. There are moderate peripheral atherosclerotic vascular calcifications of the aorta and its major branches. Reproductive: The uterus is  unremarkable. No large adnexal mass. Other: There is a tiny fat containing umbilical hernia. The soft tissues and abdominal wall are otherwise unremarkable. Musculoskeletal: No suspicious osseous lesions. There are mild multilevel degenerative changes in the visualized spine. L4-5 posterior spinal fixation noted. Review of the MIP images confirms the above findings. IMPRESSION: 1. No acute aortic syndrome. No thoracic or abdominal aortic aneurysm, dissection or penetrating atherosclerotic ulcer. 2. No acute inflammatory process identified within the abdomen or pelvis. 3. There is an irregular lobulated pleural based at least 1.3 x 1.3 cm opacity in the right lung lower lobe, which is new since the prior study. Short-term follow-up examination is recommended in 4-6 weeks. 4. Redemonstration of mediastinal and hilar lymphadenopathy, without significant interval change. 5. Multiple other nonacute observations, as described above. Aortic Atherosclerosis (ICD10-I70.0). Electronically Signed   By: Ree Molt M.D.   On: 04/14/2024 13:45   DG Chest 2 View Result Date: 04/14/2024 CLINICAL DATA:  Chest pain. EXAM: CHEST - 2 VIEW COMPARISON:  September 10, 2019 FINDINGS: The heart size and mediastinal contours are within normal limits. There is mild to moderate severity calcification of the aortic arch. Mild, diffuse, chronic appearing increased interstitial lung markings are seen. There is no evidence of focal consolidation, pleural effusion or pneumothorax. Stable spinal stimulator wire positioning is noted. The visualized skeletal structures are unremarkable. IMPRESSION: Chronic appearing increased interstitial lung markings without evidence of acute or active cardiopulmonary disease. Electronically Signed   By: Suzen Dials M.D.   On: 04/14/2024 11:43     Assessment and Plan:  53 year old with coronary disease multiple stents as described above here with chest discomfort/unstable anginal type symptoms.   -Recent nuclear stress test about 5 months ago showed anterior infarct pattern with mild peri-infarct ischemia felt to be overall  low risk. -Now coming in with recurrent chest discomfort.  Has had chest pain radiating to her jaw in the past according to prior office notes reviewed from .  Thankfully troponin at this point is normal.  EKG does show some accentuation of her ST-T wave changes with accentuated T wave inversion fairly diffuse. -Given her recurrence of symptoms with fairly significant coronary anatomy, cardiac catheterization will be pursued.  Risks and benefits of been explained including stroke heart attack death renal impairment bleeding.  She is willing to proceed.  Husband in room as well.-Anginal symptoms have been present despite metoprolol  25 mg twice a day and isosorbide  30 mg a day   COPD 3 L home O2 - Dr. 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  Prior VQ scan negative.  Hyperlipidemia - Prior LDL 28 on atorvastatin  80 mg.  Excellent.  Hypertension - Well-controlled on metoprolol  25 mg twice a day as well as isosorbide  30 mg a day.   Risk Assessment/Risk Scores:    TIMI Risk Score for Unstable Angina or Non-ST Elevation MI:   The patient's TIMI risk score is 5, which indicates a 26% risk of all cause mortality, new or recurrent myocardial infarction or need for urgent revascularization in the next 14 days.       For questions or updates, please contact [facility] Please consult [email or web address] for contact info under    Signed, Oneil Parchment, MD  04/14/2024 4:34 PM

## 2024-04-14 NOTE — ED Provider Notes (Signed)
  Assume Care - Medical Decision Making  Care of patient assumed from previous emergency medicine provider. See their note for further details of history, physical exam and plan.  Briefly, Tanya Lowe is a 53 y.o. female who presents with chest pain shortness of breath.  Cardiology consulted  Plan at time of Handoff: - Follow-up cardiology   Vitals:   04/14/24 1430 04/14/24 1445  BP: 113/78 103/71  Pulse: 80 (!) 59  Resp: 17 17  Temp:    SpO2: 94% 96%   Physical Exam  BP (!) 135/91   Pulse 87   Temp 98.1 F (36.7 C) (Oral)   Resp 16   SpO2 100%     Additional MDM/ED Course: Due to extensive cardiac history cardiology will admit for possible heart cath tomorrow.  Patient not require any acute intervention while in my care.    Waddell Seats, DO PGY-3 Emergency Medicine    Seats Waddell, DO 04/14/24 2225    Tanya Elsie CROME, MD 04/14/24 [id number]

## 2024-04-14 NOTE — ED Provider Notes (Signed)
 [place] EMERGENCY DEPARTMENT AT [facility] Provider Note   CSN: [id number] Arrival date & time: 04/14/24  1004     Patient presents with: Chest Pain   Tanya Lowe is a 53 y.o. female with PMHx OA, CAD (s/p NSTEMI in 2016 with DES to mid-RCA, DES to [facility] and DES to mid-LAD, low-risk NST in 05/2016 and 11/2018), CHF, DM, COPD/ILD and chronic respiratory failure on 3L [place] at baseline, fibromyalgia, who presents to ED concerned for central chest pain radiating to the back since this morning. Patient endorsing chest pain and SOB starting while walking down a long hallway in PCP office this morning. SOB resolved with rest. The chest pain eventually resolved after EMS provided patient with 0.4 NTG and 324 ASA. Patient stating that her chest pain is starting to reoccur again while sitting in ED bed. Patient endorses chronic cough at baseline that has not recently worsened.   Denies fever, nausea, vomiting, diarrhea, dysuria, hematuria, hematochezia.    HPI     Prior to Admission medications   Medication Sig Start Date End Date Taking? Authorizing Provider  albuterol  (VENTOLIN  HFA) 108 (90 Base) MCG/ACT inhaler Inhale 2 puffs into the lungs every 4 (four) hours as needed for shortness of breath or wheezing. 12/21/23   [provider]  aspirin  EC 81 MG tablet Take 81 mg by mouth daily.    [provider]  atorvastatin  (LIPITOR ) 80 MG tablet Take 1 tablet (80 mg total) by mouth daily at 6 PM. 10/26/14   Maryjo Dorise ORN, PA-C  budesonide -formoterol  (SYMBICORT ) 80-4.5 MCG/ACT inhaler Take 2 puffs first thing in am and then another 2 puffs about 12 hours later. 05/01/23   Darlean Ozell NOVAK, MD  busPIRone  (BUSPAR ) 7.5 MG tablet Take 15 mg by mouth 3 (three) times daily.    [provider]  cholecalciferol (VITAMIN D3) 25 MCG (1000 UNIT) tablet Take 1,000 Units by mouth daily.    [provider]  DULoxetine  (CYMBALTA ) 30 MG capsule Take 30 mg by mouth  daily. Take with 60 mg to equal 90 mg 07/07/22   [provider]  DULoxetine  (CYMBALTA ) 60 MG capsule Take 60 mg by mouth daily. Take with 30 mg to equal 90 mg    [provider]  FEROSUL 325 (65 Fe) MG tablet Take 325 mg by mouth every other day. 06/30/22   [provider]  fexofenadine (ALLEGRA) 180 MG tablet Take 180 mg by mouth daily.    [provider]  fluticasone  (FLONASE) 50 MCG/ACT nasal spray Place 2 sprays into both nostrils daily as needed for allergies.  07/28/17   [provider]  furosemide  (LASIX ) 40 MG tablet Take 1 tablet (40 mg total) by mouth daily. 09/13/23 03/24/24  Bryn Bernardino NOVAK, MD  gabapentin  (NEURONTIN ) 300 MG capsule Take 900 mg by mouth 3 (three) times daily.    [provider]  isosorbide  mononitrate (IMDUR ) 30 MG 24 hr tablet TAKE ONE TABLET BY MOUTH ONCE DAILY 07/20/23   Strader, [place] M, PA-C  metFORMIN (GLUCOPHAGE) 500 MG tablet Take 500 mg by mouth daily with breakfast. 04/30/20   [provider]  metoprolol  tartrate (LOPRESSOR ) 25 MG tablet Take 1 tablet (25 mg total) by mouth 2 (two) times daily. 03/24/24   Strader, Laymon CHRISTELLA, PA-C  metroNIDAZOLE (METROCREAM) 0.75 % cream Apply 1 Application topically 2 (two) times daily. 08/11/23   [provider]  nitroGLYCERIN  (NITROSTAT ) 0.4 MG SL tablet Place 1 tablet (0.4 mg total)  under the tongue every 5 (five) minutes as needed for chest pain. 11/28/15   Alvan Dorn FALCON, MD  omeprazole  (PRILOSEC) 20 MG capsule Take 20 mg by mouth daily. 03/09/22   [provider]  oxyCODONE -acetaminophen  (PERCOCET) 10-325 MG per tablet Take 1 tablet by mouth every 8 (eight) hours as needed for pain.    [provider]  Tiotropium Bromide  Monohydrate (SPIRIVA  RESPIMAT) 2.5 MCG/ACT AERS Inhale 2 puffs into the lungs daily. 10/22/23   Darlean Ozell NOVAK, MD  tiZANidine  (ZANAFLEX ) 4 MG tablet Take 4 mg by mouth 3 (three) times daily. 02/22/15   [provider]    Allergies: Lyrica [pregabalin], Bactrim  [sulfamethoxazole -trimethoprim ], and Prednisone    Review of Systems  Cardiovascular:  Positive for chest pain.    Updated Vital Signs BP 103/71   Pulse (!) 59   Temp 97.6 F (36.4 C)   Resp 17   Ht 5' 2.5 (1.588 m)   Wt 79.8 kg   SpO2 96%   BMI 31.66 kg/m   Physical Exam Vitals and nursing note reviewed.  Constitutional:      General: She is not in acute distress.    Appearance: She is not ill-appearing, toxic-appearing or diaphoretic.  HENT:     Head: Normocephalic and atraumatic.     Mouth/Throat:     Mouth: Mucous membranes are moist.   Eyes:     General: No scleral icterus.       Right eye: No discharge.        Left eye: No discharge.     Conjunctiva/sclera: Conjunctivae normal.    Cardiovascular:     Rate and Rhythm: Normal rate and regular rhythm.     Pulses: Normal pulses.     Heart sounds: Normal heart sounds. No murmur heard. Pulmonary:     Effort: Pulmonary effort is normal. No respiratory distress.     Breath sounds: Normal breath sounds. No wheezing, rhonchi or rales.  Abdominal:     General: Abdomen is flat. There is no distension.     Palpations: Abdomen is soft. There is no mass.     Tenderness: There is no abdominal tenderness.   Musculoskeletal:     Right lower leg: No edema.     Left lower leg: No edema.   Skin:    General: Skin is warm and dry.     Findings: No rash.   Neurological:     General: No focal deficit present.     Mental Status: She is alert and oriented to person, place, and time. Mental status is at baseline.   Psychiatric:        Mood and Affect: Mood normal.        Behavior: Behavior normal.     (all labs ordered are listed, but only abnormal results are displayed) Labs Reviewed  CBC - Abnormal; Notable for the following components:      Result Value   Hemoglobin 11.9 (*)    All other components within normal limits  BASIC METABOLIC PANEL WITH GFR  TROPONIN I  (HIGH SENSITIVITY)  TROPONIN I (HIGH SENSITIVITY)    EKG: EKG Interpretation Date/Time:  Thursday April 14 2024 10:12:57 EDT Ventricular Rate:  69 PR Interval:  146 QRS Duration:  76 QT Interval:  428 QTC Calculation: 458 R Axis:   108  Text Interpretation: Normal sinus rhythm Possible Right ventricular hypertrophy ST & T wave abnormality, consider inferior ischemia ST & T wave abnormality, consider anterolateral ischemia  ST/T changes  similar to Nov 2024 Confirmed by Freddi Hamilton [phone]) on 04/14/2024 10:58:49 AM  Radiology: CT Angio Chest/Abd/Pel for Dissection W and/or W/WO Result Date: 04/14/2024 CLINICAL DATA:  Acute aortic syndrome (AAS) suspected.  Chest pain. EXAM: CT ANGIOGRAPHY CHEST, ABDOMEN AND PELVIS TECHNIQUE: Non-contrast CT of the chest was initially obtained. Multidetector CT imaging through the chest, abdomen and pelvis was performed using the standard protocol during bolus administration of intravenous contrast. Multiplanar reconstructed images and MIPs were obtained and reviewed to evaluate the vascular anatomy. RADIATION DOSE REDUCTION: This exam was performed according to the departmental dose-optimization program which includes automated exposure control, adjustment of the mA and/or kV according to patient size and/or use of iterative reconstruction technique. CONTRAST:  75mL OMNIPAQUE  IOHEXOL  350 MG/ML SOLN COMPARISON:  CT angiography chest from 09/10/2023 and PET-CT scan from 07/31/2022. FINDINGS: CTA CHEST FINDINGS Cardiovascular: No intramural hematoma noted in the thoracic aorta on the unenhanced images. Thoracic aorta is normal in caliber without aneurysm, dissection, vasculitis or significant stenosis. There is also satisfactory opacification of bilateral pulmonary arteries. There is no embolism to the proximal subsegmental pulmonary artery level. There is dilation of the main pulmonary trunk measuring up to 3.1 cm, which is nonspecific but can be seen with pulmonary  artery hypertension. Normal cardiac size. No pericardial effusion. No aortic aneurysm. There are coronary artery calcifications, in keeping with coronary artery disease. There are also mild peripheral atherosclerotic vascular calcifications of thoracic aorta and its major branches. Mediastinum/Nodes: Visualized thyroid gland appears grossly unremarkable. No solid / cystic mediastinal masses. The esophagus is nondistended precluding optimal assessment. Redemonstration of multiple enlarged bilateral hilar and mediastinal lymph nodes for example left prevascular lymph node measuring up to 1.6 x 2.8 cm and subcarinal lymph node measuring up to 1.6 x 2.3 cm. These are essentially unchanged since the prior study. No axillary lymphadenopathy by size criteria. Lungs/Pleura: The central tracheo-bronchial tree is patent. Redemonstration of diffuse ground-glass changes throughout bilateral lungs, essentially similar to the prior study. There is mosaic attenuation of lungs, consistent with heterogeneous air trapping related to small airways disease. Redemonstration of peripheral reticulations and honeycombing in the anterior bilateral upper lobes. There is an irregular lobulated pleural based at least 1.3 x 1.3 cm opacity in the right lung lower lobe, which is new since the prior study. This is most likely infective/inflammatory in etiology; however, short-term follow-up examination is recommended in 4-6 weeks to exclude underlying neoplastic process. No pleural effusion or pneumothorax. No lung mass, collapse or consolidation. Musculoskeletal: The visualized soft tissues of the chest wall are grossly unremarkable. No suspicious osseous lesions. There are mild multilevel degenerative changes in the visualized spine. Neurostimulator device noted with its lead along the posterior aspect of the spinal canal at T7-T8 level. Review of the MIP images confirms the above findings. CTA ABDOMEN AND PELVIS FINDINGS VASCULAR Aorta: Normal  caliber aorta without aneurysm, dissection, vasculitis or significant stenosis. Celiac: Patent without evidence of aneurysm, dissection, vasculitis or significant stenosis. SMA: Patent without evidence of aneurysm, dissection, vasculitis or significant stenosis. Renals: Both renal arteries are patent without evidence of aneurysm, dissection, vasculitis, fibromuscular dysplasia or significant stenosis. IMA: Patent without evidence of aneurysm, dissection, vasculitis or significant stenosis. Inflow: Patent without evidence of aneurysm, dissection, vasculitis or significant stenosis. Veins: No obvious venous abnormality within the limitations of this arterial phase study. Review of the MIP images confirms the above findings. NON-VASCULAR Hepatobiliary: The liver is normal in size. Non-cirrhotic configuration. No suspicious mass. No intrahepatic or extrahepatic bile duct  dilation. No calcified gallstones. Normal gallbladder wall thickness. No pericholecystic inflammatory changes. Pancreas: Unremarkable. No pancreatic ductal dilatation or surrounding inflammatory changes. Spleen: Within normal limits. No focal lesion. Adrenals/Urinary Tract: Adrenal glands are unremarkable. No suspicious renal mass. No hydronephrosis. No renal or ureteric calculi. Unremarkable urinary bladder. Stomach/Bowel: No disproportionate dilation of the small or large bowel loops. No evidence of abnormal bowel wall thickening or inflammatory changes. The appendix is unremarkable. Vascular/Lymphatic: No ascites or pneumoperitoneum. No abdominal or pelvic lymphadenopathy, by size criteria. No aneurysmal dilation of the major abdominal arteries. There are moderate peripheral atherosclerotic vascular calcifications of the aorta and its major branches. Reproductive: The uterus is unremarkable. No large adnexal mass. Other: There is a tiny fat containing umbilical hernia. The soft tissues and abdominal wall are otherwise unremarkable. Musculoskeletal:  No suspicious osseous lesions. There are mild multilevel degenerative changes in the visualized spine. L4-5 posterior spinal fixation noted. Review of the MIP images confirms the above findings. IMPRESSION: 1. No acute aortic syndrome. No thoracic or abdominal aortic aneurysm, dissection or penetrating atherosclerotic ulcer. 2. No acute inflammatory process identified within the abdomen or pelvis. 3. There is an irregular lobulated pleural based at least 1.3 x 1.3 cm opacity in the right lung lower lobe, which is new since the prior study. Short-term follow-up examination is recommended in 4-6 weeks. 4. Redemonstration of mediastinal and hilar lymphadenopathy, without significant interval change. 5. Multiple other nonacute observations, as described above. Aortic Atherosclerosis (ICD10-I70.0). Electronically Signed   By: Ree Molt M.D.   On: 04/14/2024 13:45   DG Chest 2 View Result Date: 04/14/2024 CLINICAL DATA:  Chest pain. EXAM: CHEST - 2 VIEW COMPARISON:  September 10, 2019 FINDINGS: The heart size and mediastinal contours are within normal limits. There is mild to moderate severity calcification of the aortic arch. Mild, diffuse, chronic appearing increased interstitial lung markings are seen. There is no evidence of focal consolidation, pleural effusion or pneumothorax. Stable spinal stimulator wire positioning is noted. The visualized skeletal structures are unremarkable. IMPRESSION: Chronic appearing increased interstitial lung markings without evidence of acute or active cardiopulmonary disease. Electronically Signed   By: Suzen Dials M.D.   On: 04/14/2024 11:43     Procedures   Medications Ordered in the ED  cefTRIAXone  (ROCEPHIN ) 1 g in sodium chloride  0.9 % 100 mL IVPB (1 g Intravenous New Bag/Given 04/14/24 1453)  azithromycin  (ZITHROMAX ) 500 mg in sodium chloride  0.9 % 250 mL IVPB (500 mg Intravenous New Bag/Given 04/14/24 1454)  iohexol  (OMNIPAQUE ) 350 MG/ML injection 75 mL (75  mLs Intravenous Contrast Given 04/14/24 1251)  sodium chloride  0.9 % bolus 500 mL (0 mLs Intravenous Stopped 04/14/24 1450)    Clinical Course as of 04/14/24 1504  Thu Apr 14, 2024  1501 SOB and chest pain after walk. SOB resolved with rest, Chest pain resolved after nitro-reoccured at rest but resolved before tx [ ]  cards [TS]    Clinical Course User Index [TS] Sherrine Birmingham, DO                                 Medical Decision Making Amount and/or Complexity of Data Reviewed Labs: ordered. Radiology: ordered.  Risk Prescription drug management.   This patient presents to the ED for concern of chest pain, this involves an extensive number of treatment options, and is a complaint that carries with it a high risk of complications and morbidity.  The differential diagnosis includes  acute coronary syndrome, congestive heart failure, pericarditis, pneumonia, pulmonary embolism, tension pneumothorax, esophageal rupture, aortic dissection, cardiac tamponade, musculoskeletal   Co morbidities that complicate the patient evaluation  OA, CAD (s/p NSTEMI in 2016 with DES to mid-RCA, DES to distal-RCA and DES to mid-LAD, low-risk NST in 05/2016 and 11/2018), CHF, DM, COPD/ILD and chronic respiratory failure on 3L  at baseline, fibromyalgia   Additional history obtained:  10/2023 ECHO: 55-65% EF 10/2023 stress test: Stress test overall shows signs of old blockages, no strong evidence of new blockages.  Reassess at her follow-up in March.   Problem List / ED Course / Critical interventions / Medication management  Patient presented for chest pain and SOB. SOB resolved with rest. Chest pain initially resolved with NTG but then recurred after arriving to ED. Physical exam with soft BP currently at 95/68. Rest of physical exam reassuring.  I Ordered, and personally interpreted labs.  CBC without leukocytosis.  There is mild anemia with hemoglobin 11.9.  BMP reassuring.  Initial and repeat  troponins within normal limits. The patient was maintained on a cardiac monitor.  I personally viewed and interpreted the EKG/cardiac monitored which showed an underlying rhythm of: no acute changes. I ordered imaging studies including chest xray to assess for process contributing to patient's symptoms. I independently visualized and interpreted imaging which showed irregular lobulated pleural based in the right lower lung lobe.  I agree with the radiologist interpretation. I am not convinced that this CT chest finding is causing patient's symptoms given lack of increased chronic cough or fever, but will provide patient with IV ABX while she is here in ED. Patient is high risk chest pain. I requested consultation with the cardiology provider on-call Trish,  and discussed lab and imaging findings as well as pertinent plan - they agree to see patient in ED.  I have reviewed the patients home medicines and have made adjustments as needed  Social Determinants of Health:  none  3:05 PM Care of Tanya Lowe  transferred to Dr. Sherrine at the end of my shift as the patient will require reassessment once labs/imaging have resulted. Patient presentation, ED course, and plan of care discussed with review of all pertinent labs and imaging. Please see his/her note for further details regarding further ED course and disposition. Plan at time of handoff is reassess patient after Cardiology consult. This may be altered or completely changed at the discretion of the oncoming team pending results of further workup.      Final diagnoses:  None    ED Discharge Orders     None          Hoy Nidia FALCON, [place] 04/14/24 1506    Freddi Hamilton, MD 04/15/24 [phone]

## 2024-04-14 NOTE — ED Notes (Signed)
 Notified RN Of O2 level 83

## 2024-04-15 ENCOUNTER — Encounter (HOSPITAL_COMMUNITY): Payer: Self-pay | Admitting: Internal Medicine

## 2024-04-15 ENCOUNTER — Other Ambulatory Visit (HOSPITAL_COMMUNITY): Payer: Self-pay

## 2024-04-15 ENCOUNTER — Telehealth (HOSPITAL_COMMUNITY): Payer: Self-pay | Admitting: Pharmacy Technician

## 2024-04-15 ENCOUNTER — Inpatient Hospital Stay (HOSPITAL_BASED_OUTPATIENT_CLINIC_OR_DEPARTMENT_OTHER)

## 2024-04-15 DIAGNOSIS — J9612 Chronic respiratory failure with hypercapnia: Secondary | ICD-10-CM | POA: Diagnosis not present

## 2024-04-15 DIAGNOSIS — J849 Interstitial pulmonary disease, unspecified: Secondary | ICD-10-CM | POA: Diagnosis not present

## 2024-04-15 DIAGNOSIS — I200 Unstable angina: Secondary | ICD-10-CM | POA: Diagnosis not present

## 2024-04-15 DIAGNOSIS — I249 Acute ischemic heart disease, unspecified: Secondary | ICD-10-CM

## 2024-04-15 DIAGNOSIS — I25110 Atherosclerotic heart disease of native coronary artery with unstable angina pectoris: Secondary | ICD-10-CM | POA: Diagnosis not present

## 2024-04-15 DIAGNOSIS — Z955 Presence of coronary angioplasty implant and graft: Secondary | ICD-10-CM

## 2024-04-15 DIAGNOSIS — J9611 Chronic respiratory failure with hypoxia: Secondary | ICD-10-CM | POA: Diagnosis not present

## 2024-04-15 LAB — BASIC METABOLIC PANEL WITH GFR
Anion gap: 13 (ref 5–15)
BUN: 10 mg/dL (ref 6–20)
CO2: 23 mmol/L (ref 22–32)
Calcium: 9.2 mg/dL (ref 8.9–10.3)
Chloride: 105 mmol/L (ref 98–111)
Creatinine, Ser: 0.79 mg/dL (ref 0.44–1.00)
GFR, Estimated: >60 mL/min (ref >60)
Glucose, Bld: 84 mg/dL (ref 70–99)
Potassium: 3.9 mmol/L (ref 3.5–5.1)
Sodium: 141 mmol/L (ref 135–145)

## 2024-04-15 LAB — CBC
HCT: 42.1 % (ref 36.0–46.0)
Hemoglobin: 13.3 g/dL (ref 12.0–15.0)
MCH: 29 pg (ref 26.0–34.0)
MCHC: 31.6 g/dL (ref 30.0–36.0)
MCV: 91.7 fL (ref 80.0–100.0)
Platelets: 187 10*3/uL (ref 150–400)
RBC: 4.59 MIL/uL (ref 3.87–5.11)
RDW: 14.9 % (ref 11.5–15.5)
WBC: 6.8 10*3/uL (ref 4.0–10.5)
nRBC: 0 % (ref 0.0–0.2)

## 2024-04-15 LAB — LIPID PANEL
Cholesterol: 69 mg/dL (ref 0–200)
HDL: 24 mg/dL — ABNORMAL LOW (ref >40)
LDL Cholesterol: 31 mg/dL (ref 0–99)
Total CHOL/HDL Ratio: 2.9 ratio
Triglycerides: 70 mg/dL (ref <150)
VLDL: 14 mg/dL (ref 0–40)

## 2024-04-15 LAB — ECHOCARDIOGRAM COMPLETE
Area-P 1/2: 3.07 cm2
Height: 62.5 in
S' Lateral: 2.9 cm
Weight: 2795.2 [oz_av]

## 2024-04-15 LAB — GLUCOSE, CAPILLARY: Glucose-Capillary: 94 mg/dL (ref 70–99)

## 2024-04-15 LAB — HEMOGLOBIN A1C
Hgb A1c MFr Bld: 5.8 % — ABNORMAL HIGH (ref 4.8–5.6)
Mean Plasma Glucose: 119.76 mg/dL

## 2024-04-15 MED ORDER — ASPIRIN 81 MG PO TBEC
81.0000 mg | DELAYED_RELEASE_TABLET | Freq: Every day | ORAL | 3 refills | Status: AC
  Filled 2024-04-15: qty 90, 90d supply, fill #0

## 2024-04-15 MED ORDER — PRASUGREL HCL 10 MG PO TABS
10.0000 mg | ORAL_TABLET | Freq: Every day | ORAL | 3 refills | Status: AC
  Filled 2024-04-15: qty 90, 90d supply, fill #0

## 2024-04-15 NOTE — TOC CM/SW Note (Addendum)
 Transition of Care [facility]) - Inpatient Brief Assessment   Patient Details  Name: Tanya Lowe MRN: [id number] Date of Birth: [birth year]  [facility] CM/SW Contact:    Sudie Erminio Deems, RN Phone Number: 04/15/2024, 10:11 AM   Clinical Narrative: Patient presented for chest pain-post cath. Benefits check submitted for Effient . PTA patient was from home with family support. Patient has PCP and insurance. Patient has no issues obtaining medications. Patient will have transportation home and he will bring portable oxygen tank for travel. No home needs identified at this time.    Transition of Care Asessment: Insurance and Status: Insurance coverage has been reviewed Patient has primary care physician: Yes Home environment has been reviewed: reviewed Prior level of function:: independent Prior/Current Home Services: No current home services Social Drivers of Health Review: SDOH reviewed no interventions necessary Readmission risk has been reviewed: Yes Transition of care needs: no transition of care needs at this time

## 2024-04-15 NOTE — Care Management CC44 (Signed)
 Condition Code 44 Documentation Completed  Patient Details  Name: Tanya Lowe MRN: [id number] Date of Birth: [birth year]   Condition Code 44 given:  Yes Patient signature on Condition Code 44 notice:  Yes Documentation of 2 MD's agreement:  Yes Code 44 added to claim:  Yes    Sudie Erminio Deems, RN 04/15/2024, 10:09 AM

## 2024-04-15 NOTE — Telephone Encounter (Signed)
 Patient Product/process development scientist completed.    The patient is insured through [facility]. Patient has Medicare and is not eligible for a copay card, but may be able to apply for patient assistance or Medicare RX Payment Plan (Patient Must reach out to their plan, if eligible for payment plan), if available.    Ran test claim for prasugrel  (Effient ) 10 mg and the current 30 day co-pay is $1.60.   This test claim was processed through [facility]- copay amounts may vary at other pharmacies due to pharmacy/plan contracts, or as the patient moves through the different stages of their insurance plan.     Tanya Lowe Sharps, CPHT Pharmacy Technician III Certified Patient Advocate [facility] Patient Advocate Team Direct Number: [phone]  Fax: [phone]

## 2024-04-15 NOTE — Progress Notes (Signed)
 CARDIAC REHAB PHASE I   PRE:  Rate/Rhythm: 65 SR    BP: sitting 115/82    SpO2: 95 3L  MODE:  Ambulation: 100 ft   POST:  Rate/Rhythm: 95 SR    BP: sitting 120/80     SpO2: 72 3L, up to 92 6L with rest and pursed lip breathing   Pt reported staying on 3L with rest and activity. Ambulated on 3L with RW. Pt vaguely reported dizziness, feeling poorly. To recliner and SpO2 72 RA. Took 2 min to increase to 90s with 6L. Pt then reports that she has been told to increase O2 with activity. However she normally does not do this in the house due to her home concentrator being far away. We discussed options for her to increase her activity in the house. She needs to walk on atleast 6L but she knows to monitor her pulse ox as well.   Discussed with pt stent, Effient , restrictions, smoking cessation, diet, exercise guidelines (as able in the house), NTG, and CRPII. Pt receptive. She smokes 8 cigarettes a day. Her s/o smokes as well. Resources and tips given for quitting. She is contemplative. She would greatly benefit from [facility]. Will refer to [facility] CRPII.  [phone] Aliene Aris BS, ACSM-CEP 04/15/2024 9:28 AM

## 2024-04-15 NOTE — Care Management Obs Status (Signed)
 MEDICARE OBSERVATION STATUS NOTIFICATION   Patient Details  Name: Tanya Lowe MRN: [id number] Date of Birth: 15-Apr-1971   Medicare Observation Status Notification Given:  Yes    Sudie Erminio Deems, RN 04/15/2024, 10:09 AM

## 2024-04-15 NOTE — Discharge Summary (Addendum)
 Discharge Summary   Patient ID: LENER VENTRESCA MRN: [id number]; DOB: 01-07-1971  Admit date: 04/14/2024 Discharge date: 04/15/2024  PCP:  Halbert Mariano SQUIBB, DO   [facility] Providers Cardiologist:  Alvan Carrier, MD     Discharge Diagnoses  Principal Problem:   Unstable angina [facility]) Active Problems:   Essential hypertension   CAD (coronary artery disease)   Chronic respiratory failure with hypoxia and hypercapnia (HCC)   ILD (interstitial lung disease) (HCC)   S/P drug eluting coronary stent placement  Diagnostic Studies/Procedures  Cardiac catheterization, 04/14/2024, performed by Dr. Mady Conclusions: Severe single-vessel coronary artery disease with 90% distal RCA bifurcation lesion ([place] 1, 0, 1).  There is also 60% stenosis of large OM2 branch as well as mild mid LAD and proximal RCA disease.. Widely patent proximal/mid LAD and distal RCA stents. Patent mid RCA stent with mild in-stent restenosis. Normal left ventricular systolic function (LVEF 55-65%) with mildly elevated filling pressure (LVEDP 20 mmHg). Complex but ultimately successful PCI to distal RCA extending into RPDA using Synergy 2.25 x 28 mm drug-eluting stent with 0% residual stenosis and TIMI-3 flow in the RCA and RPDA.  RPLAV wire dissection occurred with transient loss of flow.  Flow was reestablished with angioplasty at the ostium with 50% residual stenosis and TIMI-1 flow.   Recommendations: Dual antiplatelet therapy with aspirin  and prasugrel  for at least 12 months, ideally longer P2 Y12 inhibitor therapy given multivessel stenting. Aggressive secondary prevention of coronary artery disease.  Echocardiogram, 04/15/2024 Awaiting final review, preliminary read by MD   _____________   History of Present Illness   Tariah CHRISTELLA Boston is a 53 y.o. female with history of CAD s/p NSTEMI with DES to prox/mid LAD, mid/distal RCA, CHF, diabetes, COPD on 3 L of oxygen at baseline, fibromyalgia.  She presented  to the [facility], ED on 04/14/2024 complaining of chest pain.  She underwent a nuclear stress test in January 2025 that showed very mild peri-infarct ischemia, deemed a low risk study.  She was experiencing central chest pain, radiating down her back starting earlier morning prior to arrival.  Hospital Course   Due to her known history of coronary artery disease with multiple stents already placed and presenting with chest pain/unstable angina type symptoms the decision to pursue cardiac catheterization was made.  She underwent cardiac catheterization on 04/14/2024 by Dr. Mady.  The cath showed severe single-vessel CAD with 90% distal RCA bifurcation lesion, with successful PCI to distal RCA extending into RPDA using DES with 0% residual stenosis.  Cath also showed widely patent proximal/mid LAD and distal RCA stents.  Patent mid RCA stent with mild in-stent restenosis.  LVEF 55 to 65% with mildly elevated filling pressure, LVEDP 20 mmHg.  Recommendations post cath include DAPT with aspirin  + Effient  for at least 12 months along with aggressive secondary prevention of CAD.    Patient stayed overnight for observation.  She was seen by myself and Dr. Jeffrie morning following her procedure.  She was deemed medically stable for discharge per MD. close follow-up appointment has been arranged.  Right radial access site is healing well, no hematomas or significant bruising   Her current treatment plan is as follows below:  Coronary artery disease s/p DES to LAD, RCA Hypertension Hyperlipidemia DAPT with ASA 81 mg daily + Effient  10 mg daily times at least 12 months Lipitor  80 mg daily Imdur  30 mg daily Lopressor  25 mg twice daily  COPD on chronic 3 L oxygen Follow-up with pulmonology,  Dr. Darlean, as an outpatient    Did the patient have an acute coronary syndrome (MI, NSTEMI, STEMI, etc) this admission?:  No                               Did the patient have a percutaneous coronary intervention (stent  / angioplasty)?:  Yes.    Cath/PCI Registry Performance & Quality Measures: Aspirin  prescribed? - Yes ADP Receptor Inhibitor (Plavix/Clopidogrel, Brilinta /Ticagrelor  or Effient /Prasugrel ) prescribed (includes medically managed patients)? - Yes High Intensity Statin (Lipitor  40-80mg  or Crestor 20-40mg ) prescribed? - Yes For EF <40%, was ACEI/ARB prescribed? - Not Applicable (EF >/= 40%) For EF <40%, Aldosterone Antagonist (Spironolactone or Eplerenone) prescribed? - Not Applicable (EF >/= 40%) Cardiac Rehab Phase II ordered? - Yes  _____________  Discharge Vitals Blood pressure 116/77, pulse 63, temperature 97.9 F (36.6 C), temperature source Oral, resp. rate 16, height 5' 2.5 (1.588 m), weight 79.2 kg, SpO2 (!) 88%.  Filed Weights   04/14/24 1023 04/14/24 1959  Weight: 79.8 kg 79.2 kg   Physical Exam General:  Well nourished, well developed, in no acute distress HEENT: normal Neck: no JVD Vascular:  Distal pulses 2+ bilaterally Cardiac:  normal S1, S2; RRR; no murmur, right radial cath site healing well  Lungs:  clear to auscultation bilaterally Abd: soft, nontender Ext: no edema Musculoskeletal:  No deformities Skin: warm and dry  Neuro:   no focal abnormalities noted Psych:  Normal affect  Labs & Radiologic Studies  CBC Recent Labs    04/14/24 1021 04/15/24 0457  WBC 8.6 6.8  HGB 11.9* 13.3  HCT 37.0 42.1  MCV 90.9 91.7  PLT 213 187   Basic Metabolic Panel Recent Labs    93/73/74 1021 04/15/24 0457  NA 139 141  K 4.2 3.9  CL 104 105  CO2 25 23  GLUCOSE 97 84  BUN 10 10  CREATININE 0.78 0.79  CALCIUM  9.0 9.2   Liver Function Tests No results for input(s): AST, ALT, ALKPHOS, BILITOT, PROT, ALBUMIN  in the last 72 hours. No results for input(s): LIPASE, AMYLASE in the last 72 hours. High Sensitivity Troponin:   Recent Labs  Lab 04/14/24 1021 04/14/24 1227  TROPONINIHS 8 8    No results for input(s): TRNPT in the last 720 hours.   BNP Invalid input(s): POCBNP No results for input(s): PROBNP in the last 72 hours.  No results for input(s): BNP in the last 72 hours.  D-Dimer No results for input(s): DDIMER in the last 72 hours. Hemoglobin A1C Recent Labs    04/15/24 0457  HGBA1C 5.8*   Fasting Lipid Panel Recent Labs    04/15/24 0457  CHOL 69  HDL 24*  LDLCALC 31  TRIG 70  CHOLHDL 2.9   No results found for: LIPOA  Thyroid Function Tests No results for input(s): TSH, T4TOTAL, T3FREE, THYROIDAB in the last 72 hours.  Invalid input(s): FREET3 _____________  CARDIAC CATHETERIZATION Result Date: 04/14/2024 Conclusions: Severe single-vessel coronary artery disease with 90% distal RCA bifurcation lesion (Medina 1, 0, 1).  There is also 60% stenosis of large OM2 branch as well as mild mid LAD and proximal RCA disease.. Widely patent proximal/mid LAD and distal RCA stents. Patent mid RCA stent with mild in-stent restenosis. Normal left ventricular systolic function (LVEF 55-65%) with mildly elevated filling pressure (LVEDP 20 mmHg). Complex but ultimately successful PCI to distal RCA extending into RPDA using Synergy 2.25 x 28 mm drug-eluting  stent with 0% residual stenosis and TIMI-3 flow in the RCA and RPDA.  RPLAV wire dissection occurred with transient loss of flow.  Flow was reestablished with angioplasty at the ostium with 50% residual stenosis and TIMI-1 flow. Recommendations: Dual antiplatelet therapy with aspirin  and prasugrel  for at least 12 months, ideally longer P2 Y12 inhibitor therapy given multivessel stenting. Aggressive secondary prevention of coronary artery disease. Lonni Hanson, MD [facility]  CT Angio Chest/Abd/Pel for Dissection W and/or W/WO Result Date: 04/14/2024 CLINICAL DATA:  Acute aortic syndrome (AAS) suspected.  Chest pain. EXAM: CT ANGIOGRAPHY CHEST, ABDOMEN AND PELVIS TECHNIQUE: Non-contrast CT of the chest was initially obtained. Multidetector CT imaging  through the chest, abdomen and pelvis was performed using the standard protocol during bolus administration of intravenous contrast. Multiplanar reconstructed images and MIPs were obtained and reviewed to evaluate the vascular anatomy. RADIATION DOSE REDUCTION: This exam was performed according to the departmental dose-optimization program which includes automated exposure control, adjustment of the mA and/or kV according to patient size and/or use of iterative reconstruction technique. CONTRAST:  75mL OMNIPAQUE  IOHEXOL  350 MG/ML SOLN COMPARISON:  CT angiography chest from 09/10/2023 and PET-CT scan from 07/31/2022. FINDINGS: CTA CHEST FINDINGS Cardiovascular: No intramural hematoma noted in the thoracic aorta on the unenhanced images. Thoracic aorta is normal in caliber without aneurysm, dissection, vasculitis or significant stenosis. There is also satisfactory opacification of bilateral pulmonary arteries. There is no embolism to the proximal subsegmental pulmonary artery level. There is dilation of the main pulmonary trunk measuring up to 3.1 cm, which is nonspecific but can be seen with pulmonary artery hypertension. Normal cardiac size. No pericardial effusion. No aortic aneurysm. There are coronary artery calcifications, in keeping with coronary artery disease. There are also mild peripheral atherosclerotic vascular calcifications of thoracic aorta and its major branches. Mediastinum/Nodes: Visualized thyroid gland appears grossly unremarkable. No solid / cystic mediastinal masses. The esophagus is nondistended precluding optimal assessment. Redemonstration of multiple enlarged bilateral hilar and mediastinal lymph nodes for example left prevascular lymph node measuring up to 1.6 x 2.8 cm and subcarinal lymph node measuring up to 1.6 x 2.3 cm. These are essentially unchanged since the prior study. No axillary lymphadenopathy by size criteria. Lungs/Pleura: The central tracheo-bronchial tree is patent.  Redemonstration of diffuse ground-glass changes throughout bilateral lungs, essentially similar to the prior study. There is mosaic attenuation of lungs, consistent with heterogeneous air trapping related to small airways disease. Redemonstration of peripheral reticulations and honeycombing in the anterior bilateral upper lobes. There is an irregular lobulated pleural based at least 1.3 x 1.3 cm opacity in the right lung lower lobe, which is new since the prior study. This is most likely infective/inflammatory in etiology; however, short-term follow-up examination is recommended in 4-6 weeks to exclude underlying neoplastic process. No pleural effusion or pneumothorax. No lung mass, collapse or consolidation. Musculoskeletal: The visualized soft tissues of the chest wall are grossly unremarkable. No suspicious osseous lesions. There are mild multilevel degenerative changes in the visualized spine. Neurostimulator device noted with its lead along the posterior aspect of the spinal canal at T7-T8 level. Review of the MIP images confirms the above findings. CTA ABDOMEN AND PELVIS FINDINGS VASCULAR Aorta: Normal caliber aorta without aneurysm, dissection, vasculitis or significant stenosis. Celiac: Patent without evidence of aneurysm, dissection, vasculitis or significant stenosis. SMA: Patent without evidence of aneurysm, dissection, vasculitis or significant stenosis. Renals: Both renal arteries are patent without evidence of aneurysm, dissection, vasculitis, fibromuscular dysplasia or significant stenosis. IMA: Patent without evidence  of aneurysm, dissection, vasculitis or significant stenosis. Inflow: Patent without evidence of aneurysm, dissection, vasculitis or significant stenosis. Veins: No obvious venous abnormality within the limitations of this arterial phase study. Review of the MIP images confirms the above findings. NON-VASCULAR Hepatobiliary: The liver is normal in size. Non-cirrhotic configuration. No  suspicious mass. No intrahepatic or extrahepatic bile duct dilation. No calcified gallstones. Normal gallbladder wall thickness. No pericholecystic inflammatory changes. Pancreas: Unremarkable. No pancreatic ductal dilatation or surrounding inflammatory changes. Spleen: Within normal limits. No focal lesion. Adrenals/Urinary Tract: Adrenal glands are unremarkable. No suspicious renal mass. No hydronephrosis. No renal or ureteric calculi. Unremarkable urinary bladder. Stomach/Bowel: No disproportionate dilation of the small or large bowel loops. No evidence of abnormal bowel wall thickening or inflammatory changes. The appendix is unremarkable. Vascular/Lymphatic: No ascites or pneumoperitoneum. No abdominal or pelvic lymphadenopathy, by size criteria. No aneurysmal dilation of the major abdominal arteries. There are moderate peripheral atherosclerotic vascular calcifications of the aorta and its major branches. Reproductive: The uterus is unremarkable. No large adnexal mass. Other: There is a tiny fat containing umbilical hernia. The soft tissues and abdominal wall are otherwise unremarkable. Musculoskeletal: No suspicious osseous lesions. There are mild multilevel degenerative changes in the visualized spine. L4-5 posterior spinal fixation noted. Review of the MIP images confirms the above findings. IMPRESSION: 1. No acute aortic syndrome. No thoracic or abdominal aortic aneurysm, dissection or penetrating atherosclerotic ulcer. 2. No acute inflammatory process identified within the abdomen or pelvis. 3. There is an irregular lobulated pleural based at least 1.3 x 1.3 cm opacity in the right lung lower lobe, which is new since the prior study. Short-term follow-up examination is recommended in 4-6 weeks. 4. Redemonstration of mediastinal and hilar lymphadenopathy, without significant interval change. 5. Multiple other nonacute observations, as described above. Aortic Atherosclerosis (ICD10-I70.0). Electronically  Signed   By: Ree Molt M.D.   On: 04/14/2024 13:45   DG Chest 2 View Result Date: 04/14/2024 CLINICAL DATA:  Chest pain. EXAM: CHEST - 2 VIEW COMPARISON:  September 10, 2019 FINDINGS: The heart size and mediastinal contours are within normal limits. There is mild to moderate severity calcification of the aortic arch. Mild, diffuse, chronic appearing increased interstitial lung markings are seen. There is no evidence of focal consolidation, pleural effusion or pneumothorax. Stable spinal stimulator wire positioning is noted. The visualized skeletal structures are unremarkable. IMPRESSION: Chronic appearing increased interstitial lung markings without evidence of acute or active cardiopulmonary disease. Electronically Signed   By: Suzen Dials M.D.   On: 04/14/2024 11:43   Disposition Pt is being discharged home today in good condition per MD.  Follow-up Plans & Appointments  Future Appointments  Date Time Provider Department Center  04/26/2024  1:15 PM Parthenia Olivia HERO, PA-C [facility]  06/14/2024  2:45 PM Sherryl Bouchard, NP [facility] None    Discharge Instructions     AMB Referral to Cardiac Rehabilitation - Phase II   Complete by: As directed    Diagnosis: Coronary Stents   After initial evaluation and assessments completed: Virtual Based Care may be provided alone or in conjunction with Phase 2 Cardiac Rehab based on patient barriers.: Yes   Intensive Cardiac Rehabilitation (ICR) [facility] location only OR Traditional Cardiac Rehabilitation (TCR) *If criteria for ICR are not met will enroll in TCR [facility] only): Yes   Call MD for:  redness, tenderness, or signs of infection (pain, swelling, redness, odor or green/yellow discharge around incision site)   Complete by: As directed  Call MD for:  severe uncontrolled pain   Complete by: As directed    Discharge instructions   Complete by: As directed    Radial Site Care Refer to this sheet in the next few weeks. These instructions provide  you with information on caring for yourself after your procedure. Your caregiver may also give you more specific instructions. Your treatment has been planned according to current medical practices, but problems sometimes occur. Call your caregiver if you have any problems or questions after your procedure.  HOME CARE INSTRUCTIONS You may shower the day after the procedure. Remove the bandage (dressing) and gently wash the site with plain soap and water. Gently pat the site dry.  Do not apply powder or lotion to the site.  Do not submerge the affected site in water for 3 to 5 days.  Inspect the site at least twice daily.  Do not flex or bend the affected arm for 24 hours.  No lifting over 5 pounds (2.3 kg) for 5 days after your procedure.  Do not drive home if you are discharged the same day of the procedure. Have someone else drive you.  You may drive 24 hours after the procedure unless otherwise instructed by your caregiver.   What to expect: Any bruising will usually fade within 1 to 2 weeks.  Blood that collects in the tissue (hematoma) may be painful to the touch. It should usually decrease in size and tenderness within 1 to 2 weeks.   SEEK IMMEDIATE MEDICAL CARE IF: You have unusual pain at the radial site.  You have redness, warmth, swelling, or pain at the radial site.  You have drainage (other than a small amount of blood on the dressing).  You have chills.  You have a fever or persistent symptoms for more than 72 hours.  You have a fever and your symptoms suddenly get worse.  Your arm becomes pale, cool, tingly, or numb.  You have heavy bleeding from the site. Hold pressure on the site.   PLEASE DO NOT MISS ANY DOSES OF YOUR EFFIENT !!!!! Also keep a log of you blood pressures and bring back to your follow up appt. Please call the office with any questions.   Patients taking blood thinners should generally stay away from medicines like ibuprofen, Advil, Motrin, naproxen, and  Aleve due to risk of stomach bleeding. You may take Tylenol  as directed or talk to your primary doctor about alternatives.  PLEASE ENSURE THAT YOU DO NOT RUN OUT OF YOUR EFFIENT . This medication is very important to remain on for at least one year. IF you have issues obtaining this medication due to cost please CALL the office 3-5 business days prior to running out in order to prevent missing doses of this medication.   We like to hold Metformin for 48 hours post-procedure after a heart cath. You can re-start your Metformin with your morning dose on Sunday 6/29      Discharge Medications Allergies as of 04/15/2024       Reactions   Lyrica [pregabalin] Shortness Of Breath   Chest Pain    Bactrim  [sulfamethoxazole -trimethoprim ] Rash   Prednisone Palpitations, Hypertension           Medication List     PAUSE taking these medications    metFORMIN 500 MG tablet Wait to take this until: April 17, 2024 Morning Commonly known as: GLUCOPHAGE Take 500 mg by mouth daily with breakfast.       STOP taking these medications  furosemide  40 MG tablet Commonly known as: Lasix        TAKE these medications    albuterol  108 (90 Base) MCG/ACT inhaler Commonly known as: VENTOLIN  HFA Inhale 2 puffs into the lungs every 4 (four) hours as needed for shortness of breath or wheezing.   aspirin  EC 81 MG tablet Take 81 mg by mouth daily. What changed: Another medication with the same name was added. Make sure you understand how and when to take each.   aspirin  EC 81 MG tablet Take 1 tablet (81 mg total) by mouth daily. Swallow whole. What changed: You were already taking a medication with the same name, and this prescription was added. Make sure you understand how and when to take each.   atorvastatin  80 MG tablet Commonly known as: LIPITOR  Take 1 tablet (80 mg total) by mouth daily at 6 PM.   budesonide -formoterol  80-4.5 MCG/ACT inhaler Commonly known as: Symbicort  Take 2 puffs  first thing in am and then another 2 puffs about 12 hours later.   busPIRone  7.5 MG tablet Commonly known as: BUSPAR  Take 15 mg by mouth 3 (three) times daily.   cholecalciferol 25 MCG (1000 UNIT) tablet Commonly known as: VITAMIN D3 Take 1,000 Units by mouth daily.   DULoxetine  60 MG capsule Commonly known as: CYMBALTA  Take 60 mg by mouth every evening. Take with 30 mg to equal 90 mg   DULoxetine  30 MG capsule Commonly known as: CYMBALTA  Take 30 mg by mouth every evening. Take with 60 mg to equal 90 mg   FeroSul 325 (65 Fe) MG tablet Generic drug: ferrous sulfate  Take 325 mg by mouth every other day.   fexofenadine 180 MG tablet Commonly known as: ALLEGRA Take 180 mg by mouth daily.   fluticasone  50 MCG/ACT nasal spray Commonly known as: FLONASE Place 2 sprays into both nostrils daily as needed for allergies.   gabapentin  300 MG capsule Commonly known as: NEURONTIN  Take 900 mg by mouth 3 (three) times daily.   isosorbide  mononitrate 30 MG 24 hr tablet Commonly known as: IMDUR  TAKE ONE TABLET BY MOUTH ONCE DAILY   metoprolol  tartrate 25 MG tablet Commonly known as: LOPRESSOR  Take 1 tablet (25 mg total) by mouth 2 (two) times daily.   metroNIDAZOLE 0.75 % cream Commonly known as: METROCREAM Apply 1 Application topically 2 (two) times daily.   nitroGLYCERIN  0.4 MG SL tablet Commonly known as: NITROSTAT  Place 1 tablet (0.4 mg total) under the tongue every 5 (five) minutes as needed for chest pain.   omeprazole  20 MG capsule Commonly known as: PRILOSEC Take 20 mg by mouth daily.   oxyCODONE -acetaminophen  10-325 MG tablet Commonly known as: PERCOCET Take 1 tablet by mouth every 8 (eight) hours as needed for pain.   prasugrel  10 MG Tabs tablet Commonly known as: EFFIENT  Take 1 tablet (10 mg total) by mouth daily.   Spiriva  Respimat 2.5 MCG/ACT Aers Generic drug: Tiotropium Bromide  Monohydrate Inhale 2 puffs into the lungs daily.   tiZANidine  4 MG  tablet Commonly known as: ZANAFLEX  Take 4 mg by mouth 3 (three) times daily.        Duration of Discharge Encounter: APP Time: 25 minutes   Signed, Waddell DELENA Donath, PA-C 04/15/2024, 9:42 AM   Personally seen and examined. Agree with above.  Successful stent of distal RCA at the bifurcation of PDA.  There was brief occlusion of posterior lateral branch however last shot of the catheterization personally reviewed does demonstrate TIMI I flow.  Preliminarily echocardiogram appears  to have normal ejection fraction.  Flattened septum as seen before secondary to elevated pulmonary pressures in the setting of COPD/interstitial lung disease.  Cardiac rehab walks with her.  She does desat with ambulation when she is on 3 L.  Instructions are for 6 L when ambulating.  Dr. Darlean has expressed this in the past for her.  New medications include dual antiplatelet therapy Effient  10 mg a day, aspirin  81 mg a day  Atorvastatin  80 mg a day, metoprolol  25 mg twice a day  Smoking cessation discussed.  Still smoking about 8 cigarettes a day.  Catheterization site normal.  Good distal pulses.  Okay to go home.  Okay to discharge.  Oneil Parchment, MD Time 25 min

## 2024-04-16 LAB — LIPOPROTEIN A (LPA): Lipoprotein (a): 22.1 nmol/L (ref <75.0)

## 2024-04-17 MED FILL — Lidocaine HCl Local Preservative Free (PF) Inj 1%: INTRAMUSCULAR | Qty: 30 | Status: AC

## 2024-04-19 ENCOUNTER — Ambulatory Visit: Payer: Self-pay | Admitting: Cardiology

## 2024-04-25 NOTE — Progress Notes (Deleted)
 Cardiology Office Note:  .   Date:  04/25/2024  ID:  Tanya Lowe, DOB 03-24-1971, MRN [id number] PCP: Halbert Mariano SQUIBB, DO  [facility] Providers Cardiologist:  Alvan Carrier, MD { Click to update primary MD,subspecialty MD or APP then REFRESH:1}   History of Present Illness: .   Tanya Lowe is a 53 y.o. female with history of CAD s/p NSTEMI with DES to prox/mid LAD, mid/distal RCA, CHF, diabetes, COPD on 3 L of oxygen at baseline, fibromyalgia.  She presented to the [facility], ED on 04/14/2024 complaining of chest pain.  She underwent a nuclear stress test in January 2025 that showed very mild peri-infarct ischemia, deemed a low risk study.  She was experiencing central chest pain, radiating down her back starting earlier morning prior to arrival.  She underwent cardiac catheterization on 04/14/2024 by Dr. Mady.  The cath showed severe single-vessel CAD with 90% distal RCA bifurcation lesion, with successful PCI to distal RCA extending into RPDA using DES with 0% residual stenosis.  Cath also showed widely patent proximal/mid LAD and distal RCA stents.  Patent mid RCA stent with mild in-stent restenosis.  LVEF 55 to 65% with mildly elevated filling pressure, LVEDP 20 mmHg.   Recommendations post cath include DAPT with aspirin  + Effient  for at least 12 months along with aggressive secondary prevention of CAD.        ROS: ***  Studies Reviewed: SABRA         Prior CV Studies: {Select studies to display:26339}  Cardiac catheterization, 04/14/2024, performed by Dr. Mady Conclusions: Severe single-vessel coronary artery disease with 90% distal RCA bifurcation lesion ([place] 1, 0, 1).  There is also 60% stenosis of large OM2 branch as well as mild mid LAD and proximal RCA disease.. Widely patent proximal/mid LAD and distal RCA stents. Patent mid RCA stent with mild in-stent restenosis. Normal left ventricular systolic function (LVEF 55-65%) with mildly elevated filling pressure (LVEDP  20 mmHg). Complex but ultimately successful PCI to distal RCA extending into RPDA using Synergy 2.25 x 28 mm drug-eluting stent with 0% residual stenosis and TIMI-3 flow in the RCA and RPDA.  RPLAV wire dissection occurred with transient loss of flow.  Flow was reestablished with angioplasty at the ostium with 50% residual stenosis and TIMI-1 flow.   Recommendations: Dual antiplatelet therapy with aspirin  and prasugrel  for at least 12 months, ideally longer P2 Y12 inhibitor therapy given multivessel stenting. Aggressive secondary prevention of coronary artery disease.   Echocardiogram, 04/15/2024  Risk Assessment/Calculations:   {Does this patient have ATRIAL FIBRILLATION?[phone]} No BP recorded.  {Refresh Note OR Click here to enter BP  :1}***       Physical Exam:   VS:  There were no vitals taken for this visit.   Orhtostatics: No data found. Wt Readings from Last 3 Encounters:  04/14/24 174 lb 11.2 oz (79.2 kg)  03/24/24 176 lb (79.8 kg)  12/31/23 178 lb 6.4 oz (80.9 kg)    GEN: Well nourished, well developed in no acute distress NECK: No JVD; No carotid bruits CARDIAC: ***RRR, no murmurs, rubs, gallops RESPIRATORY:  Clear to auscultation without rales, wheezing or rhonchi  ABDOMEN: Soft, non-tender, non-distended EXTREMITIES:  No edema; No deformity   ASSESSMENT AND PLAN: .    CAD  cardiac catheterization on 04/14/2024 by Dr. Mady.  The cath showed severe single-vessel CAD with 90% distal RCA bifurcation lesion, with successful PCI to distal RCA extending into RPDA using DES with 0% residual stenosis.  Cath also showed widely patent proximal/mid LAD and distal RCA stents.  Patent mid RCA stent with mild in-stent restenosis.  LVEF 55 to 65% with mildly elevated filling pressure, LVEDP 20 mmHg  HTN  COPD on O2.   Tobacco abuse  {The patient has an active order for outpatient cardiac rehabilitation.   Please indicate if the patient is ready to start. Do NOT delete this.  It  will auto delete.  Refresh note, then sign.              Click here to document readiness and see contraindications.  :1}  Cardiac Rehabilitation Eligibility Assessment      {Are you ordering a CV Procedure (e.g. stress test, cath, DCCV, TEE, etc)?   Press F2        :[id number]}  Dispo: ***  Signed, Olivia Pavy, PA-C

## 2024-04-26 ENCOUNTER — Ambulatory Visit: Admitting: Physician Assistant

## 2024-04-27 ENCOUNTER — Ambulatory Visit: Attending: Cardiology | Admitting: Cardiology

## 2024-04-27 ENCOUNTER — Encounter: Payer: Self-pay | Admitting: Cardiology

## 2024-04-27 VITALS — BP 98/58 | HR 68 | Resp 16 | Ht 62.0 in | Wt 182.0 lb

## 2024-04-27 DIAGNOSIS — I5032 Chronic diastolic (congestive) heart failure: Secondary | ICD-10-CM

## 2024-04-27 DIAGNOSIS — E782 Mixed hyperlipidemia: Secondary | ICD-10-CM

## 2024-04-27 DIAGNOSIS — I10 Essential (primary) hypertension: Secondary | ICD-10-CM

## 2024-04-27 DIAGNOSIS — I2510 Atherosclerotic heart disease of native coronary artery without angina pectoris: Secondary | ICD-10-CM | POA: Diagnosis not present

## 2024-04-27 MED ORDER — NITROGLYCERIN 0.4 MG SL SUBL: 0.4000 mg | SUBLINGUAL_TABLET | SUBLINGUAL | 3 refills | Status: AC | PRN

## 2024-04-27 MED ORDER — FUROSEMIDE 40 MG PO TABS: 40.0000 mg | ORAL_TABLET | Freq: Every day | ORAL | 5 refills | Status: AC | PRN

## 2024-04-27 NOTE — Progress Notes (Signed)
 Clinical Summary Tanya Lowe is a 53 y.o.female seen today for follow up of the following medical problems.     1. CAD - history of NSTEMI Jan 2016, s/p DES to mid RCA and DES to distal RCA and DES to mid LAD.   - 04/2015 echo LVEF 55-60% -11/2015 DSE without ischemia - 05/2016 nuclear stress [place] no clear ischemia, limited by signicant gut radiotrace uptake   11/2018 nuclear stress: no ischemia      Jan 2022 echo: LVEF 65-70%, no WMAs, normal diastolic, normal RV   Jan 2025 nuclear stress: anterior infarct with very mild peri-infarct ischemia.   - admit 03/2024 with chest pain - 03/2024 cath: LM normal. Patent LAD stent, LCX patent, OM2 60%, RCA 25% and distal 90%,   -PCI to distal RCA extending into RPDA using Synergy 2.25 x 28 mm drug-eluting stent with 0% residual stenosis and TIMI-3 flow in the RCA and RPDA.  RPLAV wire dissection occurred with transient loss of flow.  Flow was reestablished with angioplasty at the ostium with 50% residual stenosis and TIMI-1 flow   -03/2024 echo: LVEF 60-65%, no WMAs, D shaped septum - some chest pains initially right after procedure, has resolved.  - compliant with meds   2. COPD on home O2 3L - followed by Dr Darlean Some recent SOB, recently diagnosed with emphysema.  12/2021. DOE with activities, occasional cough/wheezing. CT chest did show some emphysema. Will use prn albuterol  with benefit.  09/2022 PFTs: severe diffusion defect - breathing much improve breztri       3. HTN -compliant with meds   4. Hyperlipidemia - 03/2024 TC 68 TG 70 HDL 24 LDL 31   5. OSA - followed by Dr Buck neurology - she remains compliant with CPAP   6. Chronic back pain - prior nerve stimulator surgery     7. DM2 - followed by pcp   8. HFpEF  - 06/2022 echo: LVEF 65-70%, mild to mod RV dysfunction, diastolic not described  - no recent edema - chronic SOB on home O2 for COPD   - admit 08/2023 with volume overload, BNP 1340. Up 7 lbs from her  baseline - 08/2023 echo: LVEF 55-60%, normal diastolic fxn, mod RV dysfunction and mod RVE, severe pulm HTN PASP 75 - no recent edema. Home weight 168 lbs and stable . - lasix  40mg  daily stopped during recent admission, unclear indication.     9. Pulmonary HTN - 06/2022 echo: mild RVE, mild to mod RV dysfunction      PASP 34 -  08/2023 echo: LVEF 55-60%, normal diastolic fxn, mod RV dysfunction and mod RVE, severe pulm HTN PASP 75 - 08/2023 CT PE no PE. paving pattern of bilateral lungs. Differential diagnosis includes pulmonary edema, ARDS, acute interstitial pneumonia, pulmonary alveolar proteinosis - 08/2023 VQ: negative - known history of HFpEF, OSA, COPD. 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  Past Medical History:  Diagnosis Date   Arthritis    Bilateral hand pain    CAD (coronary artery disease)    a. s/p NSTEMI in 2016 with DES to mid-RCA, DES to [facility] and DES to mid-LAD b. low-risk NST in 05/2016 and 11/2018   Cancer (HCC)    Skin   Cervical radiculopathy    CHF (congestive heart failure) (HCC)    Chronic neck and back pain    Diabetes mellitus without complication (HCC)    Emphysema lung (HCC)    Fibromyalgia    Hypertension  Kidney stones    hx of w previous lithotripsy   MI (myocardial infarction) (HCC)    Pain management    [facility]   Thoracic back pain      Allergies  Allergen Reactions   Lyrica [Pregabalin] Shortness Of Breath    Chest Pain    Bactrim  [Sulfamethoxazole -Trimethoprim ] Rash   Prednisone Palpitations and Hypertension          Current Outpatient Medications  Medication Sig Dispense Refill   albuterol  (VENTOLIN  HFA) 108 (90 Base) MCG/ACT inhaler Inhale 2 puffs into the lungs every 4 (four) hours as needed for shortness of breath or wheezing.     aspirin  EC 81 MG tablet Take 81 mg by mouth daily.     aspirin  EC 81 MG tablet Take 1 tablet (81 mg total) by mouth daily. Swallow whole. 90 tablet 3   atorvastatin  (LIPITOR ) 80 MG  tablet Take 1 tablet (80 mg total) by mouth daily at 6 PM. 30 tablet 11   budesonide -formoterol  (SYMBICORT ) 80-4.5 MCG/ACT inhaler Take 2 puffs first thing in am and then another 2 puffs about 12 hours later. 1 each 11   busPIRone  (BUSPAR ) 7.5 MG tablet Take 15 mg by mouth 3 (three) times daily.     cholecalciferol  (VITAMIN D3) 25 MCG (1000 UNIT) tablet Take 1,000 Units by mouth daily.     DULoxetine  (CYMBALTA ) 30 MG capsule Take 30 mg by mouth every evening. Take with 60 mg to equal 90 mg     DULoxetine  (CYMBALTA ) 60 MG capsule Take 60 mg by mouth every evening. Take with 30 mg to equal 90 mg     FEROSUL 325 (65 Fe) MG tablet Take 325 mg by mouth every other day.     fexofenadine (ALLEGRA) 180 MG tablet Take 180 mg by mouth daily.     fluticasone  (FLONASE) 50 MCG/ACT nasal spray Place 2 sprays into both nostrils daily as needed for allergies.      gabapentin  (NEURONTIN ) 300 MG capsule Take 900 mg by mouth 3 (three) times daily.     isosorbide  mononitrate (IMDUR ) 30 MG 24 hr tablet TAKE ONE TABLET BY MOUTH ONCE DAILY 90 tablet 0   metFORMIN (GLUCOPHAGE) 500 MG tablet Take 500 mg by mouth daily with breakfast.     metoprolol  tartrate (LOPRESSOR ) 25 MG tablet Take 1 tablet (25 mg total) by mouth 2 (two) times daily. 180 tablet 3   metroNIDAZOLE (METROCREAM) 0.75 % cream Apply 1 Application topically 2 (two) times daily.     nitroGLYCERIN  (NITROSTAT ) 0.4 MG SL tablet Place 1 tablet (0.4 mg total) under the tongue every 5 (five) minutes as needed for chest pain. 25 tablet 3   omeprazole  (PRILOSEC) 20 MG capsule Take 20 mg by mouth daily.     oxyCODONE -acetaminophen  (PERCOCET) 10-325 MG per tablet Take 1 tablet by mouth every 8 (eight) hours as needed for pain.     prasugrel  (EFFIENT ) 10 MG TABS tablet Take 1 tablet (10 mg total) by mouth daily. 90 tablet 3   Tiotropium Bromide  Monohydrate (SPIRIVA  RESPIMAT) 2.5 MCG/ACT AERS Inhale 2 puffs into the lungs daily. 4 g 11   tiZANidine  (ZANAFLEX ) 4 MG  tablet Take 4 mg by mouth 3 (three) times daily.     No current facility-administered medications for this visit.     Past Surgical History:  Procedure Laterality Date   CARDIAC SURGERY     CORONARY STENT INTERVENTION N/A 04/14/2024   Procedure: CORONARY STENT INTERVENTION;  Surgeon: Mady Bruckner, MD;  Location: [facility] INVASIVE CV LAB;  Service: Cardiovascular;  Laterality: N/A;  RCA   CYSTOSCOPY WITH RETROGRADE PYELOGRAM, URETEROSCOPY AND STENT PLACEMENT Left 03/14/2015   Procedure: CYSTOSCOPY WITH LEFT RETROGRADE PYELOGRAM;  Surgeon: Glendia Elizabeth, MD;  Location: [facility] ORS;  Service: Urology;  Laterality: Left;   CYSTOSCOPY WITH RETROGRADE PYELOGRAM, URETEROSCOPY AND STENT PLACEMENT Left 03/20/2015   Procedure: CYSTOSCOPY WITH LEFT  RETROGRADE LEFT URETEROSCOPY AND LEFT  STENT ;  Surgeon: Norleen Seltzer, MD;  Location: [facility] OR[facility];  Service: Urology;  Laterality: Left;   HOLMIUM LASER APPLICATION Left 03/20/2015   Procedure: HOLMIUM LASER APPLICATION;  Surgeon: Norleen Seltzer, MD;  Location: [facility] OR[facility];  Service: Urology;  Laterality: Left;   LEFT HEART CATH AND CORONARY ANGIOGRAPHY N/A 04/14/2024   Procedure: LEFT HEART CATH AND CORONARY ANGIOGRAPHY;  Surgeon: Mady Bruckner, MD;  Location: [facility] INVASIVE CV LAB;  Service: Cardiovascular;  Laterality: N/A;   LEFT HEART CATHETERIZATION WITH CORONARY ANGIOGRAM N/A 10/25/2014   Procedure: LEFT HEART CATHETERIZATION WITH CORONARY ANGIOGRAM;  Surgeon: Bruckner JONETTA Cash, MD;  Location[facility] CATH LAB;  Service: Cardiovascular;  Laterality: N/A;   LITHOTRIPSY  about 2011   SPINAL FUSION     L5-S1   TUBAL LIGATION       Allergies  Allergen Reactions   Lyrica [Pregabalin] Shortness Of Breath    Chest Pain    Bactrim  [Sulfamethoxazole -Trimethoprim ] Rash   Prednisone Palpitations and Hypertension           Family History  Problem Relation Age of Onset   Diabetes Mother    Stroke Mother    Seizures Other    Cancer Other    Diabetes Other       Social History Ms. Wheeler reports that she has been smoking cigarettes. She started smoking about 33 years ago. She has a 25.1 pack-year smoking history. She has never used smokeless tobacco. Ms. Mellone reports no history of alcohol use.    Physical Examination Today's Vitals   04/27/24 1446  BP: (!) 98/58  Pulse: 68  Resp: 16  SpO2: 98%  Weight: 182 lb (82.6 kg)  Height: 5' 2 (1.575 m)   Body mass index is 33.29 kg/m.  Gen: resting comfortably, no acute distress HEENT: no scleral icterus, pupils equal round and reactive, no palptable cervical adenopathy,  CV: RRR, no m/rg, no jvd Resp: Clear to auscultation bilaterally GI: abdomen is soft, non-tender, non-distended, normal bowel sounds, no hepatosplenomegaly MSK: extremities are warm, no edema.  Skin: warm, no rash Neuro:  no focal deficits Psych: appropriate affect   Diagnostic Studies  Jan 2016 Cath PCI Note: She was given an additional 5000 units IV heparin  x 1. ACT was over 200. She was given Brilinta  180 mg po x 1.   Lesion # 1 Distal RCA: JR4 guiding catheter used to engage the RCA. Cougar IC wire down the RCA. 2.0 x 15 mm balloon x 1 for pre-dilatation. 2.25 x 16 mm Promus Premier DES x 1 distal RCA post-dilated with a 2.5 x 12 mm Du[place]weg balloon x 1. Stenosis taken from 90% down to 0%.   Lesion # 2 mid RCA: 2.0 x 15 mm balloon x 1. 3.0 x 12 mm Promus Premier DES x 1. The mid stenosis was followed by an aneurysmal segment with a size mismatch before the distal vessel so stents were not overlapped. Post-dilatation with a 3.5 x 8 mm [place] balloon x 1. Stenosis taken from 95% down to 0%.   Lesion #3 mid LAD: XB LAD  3.5 guiding catheter to engage left main. 2.5 x 12 mm balloon x 1 for pre-dilatation. 3.5 x 16 mm Promus Premier DES x 1 mid LAD. Stent post-dilated with a 3.5 x 8 mm Tome balloon x 2. Stenosis taken from 80% down to 0%.   The sheath was removed from the right radial artery and a Terumo hemostasis band was applied  at the arteriotomy site on the right wrist. There were no immediate complications. The patient was taken to the recovery area in stable condition.   Hemodynamic Findings: Central aortic pressure: 115/66 Left ventricular pressure: 121/3/14   Angiographic Findings:   Left main: No obstructive disease.     Left Anterior Descending Artery: Large caliber vessel that courses to the apex. 80% mid stenosis. Small caliber diagonal Sharlize Hoar. The distal LAD tapers to a small caliber vessel.     Circumflex Artery: Large caliber vessel with 20% proximal stenosis.     Right Coronary Artery: Large dominant vessel with diffuse 30% proximal stenosis, 95% mid stenosis followed by an aneurysmal segment. Distal 90% stenosis. Diffuse mild plaque in the posterolateral artery and PDA.   Left Ventricular Angiogram: LVEF=55-60%.   Impression: 1. Severe double vessel CAD 2. NSTEMI 3. Normal LV systolic function 4. Successful PTCA/DES x 1 mid RCA and PTCA/DES x 1 distal RCA (stents not overlapped due to size mismatch in the vessel and the presence of an aneurysmal segment in the distal segment of the mid vessel) 5. Successful PTCA/DES x 1 mid LAD   Recommendations: Will need dual anti-platelet therapy with ASA and Brilinta  for at least one year. Continue beta blocker and statin. Tobacco cessation.          04/2015 echo Study Conclusions  - Left ventricle: The cavity size was normal. Wall thickness was   increased in a pattern of mild LVH. Systolic function was normal.   The estimated ejection fraction was in the range of 55% to 60%.   Wall motion was normal; there were no regional wall motion   abnormalities. Left ventricular diastolic function parameters   were normal. - Aortic valve: Mildly calcified annulus. Trileaflet; mildly   thickened leaflets. Valve area (VTI): 2.07 cm^2. Valve area   (Vmax): 2.2 cm^2. - Atrial septum: No defect or patent foramen ovale was identified. - Technically adequate  study.   11/2015 DSE ------------------------------------------------------------------- Stress echo results:     Left ventricular ejection fraction was normal at rest and with stress. Normal echo stress   05/2016 Nuclear stress [place] Severe gut uptake, limited study. No clear ischemia       11/2018 nuclear stress   There was no ST segment deviation noted during stress. The study is normal. There are no perfusion defects consistent with prior infarct or current ischemia This is a low risk study. The left ventricular ejection fraction is normal (55-65%).   Jan 2022 echo   IMPRESSIONS     1. Left ventricular ejection fraction, by estimation, is 65 to 70%. The  left ventricle has normal function. The left ventricle has no regional  wall motion abnormalities. There is mild left ventricular hypertrophy.  Left ventricular diastolic parameters  were normal.   2. Right ventricular systolic function is normal. The right ventricular  size is normal. Tricuspid regurgitation signal is inadequate for assessing  PA pressure.   3. The mitral valve is grossly normal. Trivial mitral valve  regurgitation.   4. The aortic valve is tricuspid. Aortic valve regurgitation is trivial.   5. The inferior  vena cava is normal in size with greater than 50%  respiratory variability, suggesting right atrial pressure of 3 mmHg.        09/2022 PFTs severe diffusion defect       Assessment and Plan  1.CAD - recent PCI as outlined above, continue DAPT at least 12 months likely longer given her extensive history     2. HTN -bp at goal, continue curreng meds   3. Hyperlipidemia - LDL at goal, continue current meds   4. Chronic HFpEF -euvolemic today, unclear why lasix  stopped during recent admission. Will start back as just 40mg  prn, so far has not had recurrent edema.    F/u 6 months      Dorn PHEBE [name], M.D.

## 2024-04-27 NOTE — Patient Instructions (Signed)
 Medication Instructions:  Your physician has recommended you make the following change in your medication:  Start taking Lasix  40 mg daily as needed for swelling  Continue taking all other medications as prescribed  Labwork: None  Testing/Procedures: None  Follow-Up: Your physician recommends that you schedule a follow-up appointment in: 6 months  Any Other Special Instructions Will Be Listed Below (If Applicable). Thank you for choosing [facility]!     If you need a refill on your cardiac medications before your next appointment, please call your pharmacy.

## 2024-04-28 ENCOUNTER — Other Ambulatory Visit (HOSPITAL_COMMUNITY): Payer: Self-pay

## 2024-04-28 DIAGNOSIS — Z955 Presence of coronary angioplasty implant and graft: Secondary | ICD-10-CM

## 2024-05-03 ENCOUNTER — Telehealth: Payer: Self-pay | Admitting: Cardiology

## 2024-05-03 NOTE — Telephone Encounter (Signed)
 Pt c/o medication issue:  1. Name of Medication: prasugrel  (EFFIENT ) 10 MG TABS tablet   2. How are you currently taking this medication (dosage and times per day)? As written   3. Are you having a reaction (difficulty breathing--STAT)? No   4. What is your medication issue? Pt called in stating her bp has been low and she has been faint since taking this med. She states she passed out on Sunday. She states today she feels fatigue.    Pt c/o Syncope: STAT if syncope occurred within 24 hours and pt complains of lightheadedness  Did you pass out today? No    When is the last time you passed out? Sunday    Has this occurred multiple times? No    Did you have any symptoms prior to passing out? Faint    5. Did you fall? If so, are you on a blood thinner? yes, Prasugrel 

## 2024-05-03 NOTE — Telephone Encounter (Signed)
 Spoke with patient - thinks her Effient  is causing some issues with dizziness & low BP's.  Patient discharged on 04/15/24 from [facility] - was started on Effient  10mg  daily at this time.  Furosemide  was stopped as well.  At last OV with Dr. Alvan - he had her resume the Furosemide  but only as needed.  States she has taken x 2 since then.  No c/o chest pain.  Does have SOB but this is not new for her & states that she has this all the time.  States that she passed out on Sunday, but did not go to ED.  States that she has increased her fluid intake as Dr. Alvan suggested.  Does have visit to see her pcp tomorrow.  Next recall in system for [place] is January 2026.    BP readings :  90/60  59 - this am 86/62  69 - last evening  90/60  69 86/62  73 - 7:40 yesterday evening 85/60  72 - passed out this day (Sunday) - rechecked BP after about 45 min's - 119/81  62

## 2024-05-03 NOTE — Telephone Encounter (Signed)
 BP's in the past have been high 90s or 60s at clinic visits, looks to be getting lower and having some symptoms. Low bp with effient  is fairly infrequent, only about 4% of the time. Would try adjusting other meds first. Clarify taking imdur  30mg  daily and if so lower to 15mg  daily, clarify taking lopressor  25mg  bid and if so lower to 12.5mg  bid. Update us  at the end of the week on bp's  [name] [name] MD

## 2024-05-03 NOTE — Telephone Encounter (Signed)
 Mailbox is full.  Will send message via my chart.

## 2024-05-04 ENCOUNTER — Other Ambulatory Visit (HOSPITAL_COMMUNITY): Payer: Self-pay | Admitting: Family Medicine

## 2024-05-04 DIAGNOSIS — R911 Solitary pulmonary nodule: Secondary | ICD-10-CM

## 2024-05-04 DIAGNOSIS — R918 Other nonspecific abnormal finding of lung field: Secondary | ICD-10-CM

## 2024-05-04 DIAGNOSIS — Z720 Tobacco use: Secondary | ICD-10-CM

## 2024-05-05 ENCOUNTER — Other Ambulatory Visit: Payer: Self-pay | Admitting: Internal Medicine

## 2024-05-10 ENCOUNTER — Telehealth: Payer: Self-pay | Admitting: *Deleted

## 2024-05-10 MED ORDER — ISOSORBIDE MONONITRATE 20 MG PO TABS: 20.0000 mg | ORAL_TABLET | Freq: Two times a day (BID) | ORAL | Status: DC

## 2024-05-10 MED ORDER — METOPROLOL TARTRATE 25 MG PO TABS: 12.5000 mg | ORAL_TABLET | Freq: Two times a day (BID) | ORAL | Status: AC

## 2024-05-10 NOTE — Telephone Encounter (Signed)
 Patient notified and verbalized understanding.  States that her pcp recently changed her Imdur  to Isosorbide  Mononitrate 20mg  twice a day on 05/09/24.  She will also change her Lopressor  to 12.5mg  twice a day beginning today.  She will continue to monitor her BP readings & how she feels & update us  next week.

## 2024-05-10 NOTE — Telephone Encounter (Signed)
 This message is to inform you that the patient has not yet read the following message. (Notification date: May 05, 2024)    reply to phone call  From [name], LPN To [name], [place] and Delivered 05/03/2024  5:31 PM      BP's in the past have been high 90s or 60s at clinic visits, looks to be getting lower and having some symptoms. Low bp with effient  is fairly infrequent, only about 4% of the time. Would try adjusting other meds first. Clarify taking imdur  30mg  daily and if so lower to 15mg  daily, clarify taking lopressor  25mg  bid (twice a day) and if so lower to 12.5mg  bid. Update us  at the end of the week on bp's   [name] [name] MD    You can break / cut the 30mg  in half for the Imdur  (Isosorbide ) & same thing with the Lopressor  (Metoprolol  tart).    I did try to call you back but your voice mail was not set up.   I will be out of the office tomorrow and back on Thursday - please reply so I will know you received this message.  Also, if you have any further questions.    Thanks,   [name] DEL, LPN        [address]  MyChart User Last Read On  Tanya Lowe Not Read

## 2024-05-11 ENCOUNTER — Telehealth: Payer: Self-pay | Admitting: Cardiology

## 2024-05-11 ENCOUNTER — Ambulatory Visit: Admitting: Physician Assistant

## 2024-05-11 NOTE — Telephone Encounter (Signed)
 Dr. Halbert calling to speak with DOD about low bp readings in office. Call disconnected while waiting.

## 2024-05-11 NOTE — Telephone Encounter (Signed)
 Spoke with Dr. Halbert who reports that today in the office, BP was 86/61. Still has reports of dizziness when standing. Patient reported to Dr. Halbert that she did take furosemide  40 mg yesterday due to a 2 lbs weight gain in one week. Advised that as needed instructions for lasix  should be used for a 2-3 lbs weight gain in 24 hours or 5 lbs gain in one week. Patient did report that she decreased metoprolol  tartrate to 12.5 mg BID on yesterday.   Dr. Halbert suggest decreasing Imdur  to 10 mg BID.  Advised that this is reasonable and message would be sent to Dr. Alvan and DOD.  Dr. Halbert was appreciative of the call back.

## 2024-05-12 MED ORDER — ISOSORBIDE MONONITRATE 20 MG PO TABS: 10.0000 mg | ORAL_TABLET | Freq: Two times a day (BID) | ORAL | Status: DC

## 2024-05-12 NOTE — Telephone Encounter (Signed)
 Agree with lower imdur , muscle relaxer is on her med list which can also affect bp if she is taking regularly. Nursing visit for bp check early next week  [name] [name] MD

## 2024-05-12 NOTE — Telephone Encounter (Signed)
 Patient informed and verbalized understanding of plan. Says she has a nurse visit scheduled with her PCP next Wednesday for a BP recheck and will contact our office with the results.

## 2024-05-19 ENCOUNTER — Ambulatory Visit (HOSPITAL_COMMUNITY)
Admission: RE | Admit: 2024-05-19 | Discharge: 2024-05-19 | Disposition: A | Discharge status: Home / Self Care | Source: Ambulatory Visit | Attending: Family Medicine | Admitting: Family Medicine

## 2024-05-19 DIAGNOSIS — R911 Solitary pulmonary nodule: Secondary | ICD-10-CM | POA: Diagnosis present

## 2024-05-19 DIAGNOSIS — R918 Other nonspecific abnormal finding of lung field: Secondary | ICD-10-CM | POA: Insufficient documentation

## 2024-05-19 MED ORDER — IOHEXOL 300 MG/ML  SOLN
75.0000 mL | Freq: Once | INTRAMUSCULAR | Status: AC | PRN
  Administered 2024-05-19: 75 mL via INTRAVENOUS

## 2024-05-25 ENCOUNTER — Other Ambulatory Visit (HOSPITAL_COMMUNITY): Payer: Self-pay | Admitting: Family Medicine

## 2024-05-25 DIAGNOSIS — R591 Generalized enlarged lymph nodes: Secondary | ICD-10-CM

## 2024-05-25 DIAGNOSIS — R911 Solitary pulmonary nodule: Secondary | ICD-10-CM

## 2024-06-02 ENCOUNTER — Ambulatory Visit (HOSPITAL_COMMUNITY)
Admission: RE | Admit: 2024-06-02 | Discharge: 2024-06-02 | Disposition: A | Discharge status: Home / Self Care | Source: Ambulatory Visit | Attending: Family Medicine | Admitting: Family Medicine

## 2024-06-02 DIAGNOSIS — R918 Other nonspecific abnormal finding of lung field: Secondary | ICD-10-CM | POA: Diagnosis not present

## 2024-06-02 DIAGNOSIS — R911 Solitary pulmonary nodule: Secondary | ICD-10-CM | POA: Diagnosis present

## 2024-06-02 DIAGNOSIS — R591 Generalized enlarged lymph nodes: Secondary | ICD-10-CM | POA: Diagnosis present

## 2024-06-02 MED ORDER — FLUDEOXYGLUCOSE F - 18 (FDG) INJECTION
9.8000 | Freq: Once | INTRAVENOUS | Status: AC | PRN
  Administered 2024-06-02: 9.8 via INTRAVENOUS

## 2024-06-13 ENCOUNTER — Encounter: Payer: Self-pay | Admitting: *Deleted

## 2024-06-14 ENCOUNTER — Telehealth (INDEPENDENT_AMBULATORY_CARE_PROVIDER_SITE_OTHER): Payer: Medicare Other | Admitting: Adult Health

## 2024-06-14 DIAGNOSIS — G4733 Obstructive sleep apnea (adult) (pediatric): Secondary | ICD-10-CM

## 2024-06-14 NOTE — Progress Notes (Signed)
 PATIENT: Tanya Lowe DOB: [birth year]  REASON FOR VISIT: follow up HISTORY FROM: patient PRIMARY NEUROLOGIST:   Virtual Visit via Video Note  I connected with Tanya Lowe on 06/14/24 at  2:45 PM EDT by a video enabled telemedicine application located remotely at [facility] and verified that I am speaking with the correct person using two identifiers who was located at their own home in [place]   I discussed the limitations of evaluation and management by telemedicine and the availability of in person appointments. The patient expressed understanding and agreed to proceed.   PATIENT: Tanya Lowe DOB: 1971-05-09  REASON FOR VISIT: follow up HISTORY FROM: patient  HISTORY OF PRESENT ILLNESS: Today 06/14/24:  Tanya Lowe is a 53 y.o. female with a history of obstructive sleep apnea on CPAP. Returns today for follow-up.  She reports that CPAP is working well.  She does see pulmonology and is now wearing oxygen during the day but states that she does not need it at night.  She has a fullface mask.  Her download is below     06/16/23: Tanya Lowe is a 53 y.o. female with a history of OSA On CPAP. Returns today for follow-up.  Reports that CPAP is working well except her mask is leaking.  She feels that she is needs a different style of mask.  She states because of the leaking makes it hard to use consistently.  Her download is below     06/20/22: Tanya Lowe is a 53 year old female with a history of obstructive sleep apnea on CPAP.  She returns today for follow-up.  Her report indicates that she used her machine 28 out of 30 days for compliance of 98%.  She used her machine greater than 4 hours 20 out of 30 days for compliance of 67%.  On average she uses her machine 5 hours and 20 minutes.  Her residual AHI is 1.4 on 9 cm of water with EPR 2.  Leak in the 95th percentile is 46 L/min.  Reports that the leak is bothering her.  She is having to sleep in the recliner.  She  also does not like the heated tubing feature.  She continues to notice the benefit.   She returns today for an evaluation.  05/07/21: Tanya Lowe is a 53 year old female with a history of obstructive sleep apnea on CPAP she returns today for follow-up.  She reports that the CPAP is working well for her.  She denies any new issues.  She returns today for evaluation.     REVIEW OF SYSTEMS: Out of a complete 14 system review of symptoms, the patient complains only of the following symptoms, and all other reviewed systems are negative.  See HPI  ALLERGIES: Allergies  Allergen Reactions   Lyrica [Pregabalin] Shortness Of Breath    Chest Pain    Bactrim  [Sulfamethoxazole -Trimethoprim ] Rash   Prednisone Palpitations and Hypertension         HOME MEDICATIONS: Outpatient Medications Prior to Visit  Medication Sig Dispense Refill   albuterol  (VENTOLIN  HFA) 108 (90 Base) MCG/ACT inhaler Inhale 2 puffs into the lungs every 4 (four) hours as needed for shortness of breath or wheezing.     aspirin  EC 81 MG tablet Take 1 tablet (81 mg total) by mouth daily. Swallow whole. 90 tablet 3   atorvastatin  (LIPITOR ) 80 MG tablet Take 1 tablet (80 mg total) by mouth daily at 6 PM. 30 tablet 11  budesonide -formoterol  (SYMBICORT ) 80-4.5 MCG/ACT inhaler INHALE TWO PUFFS first thing in IN THE MORNING; THEN TWO puffs 12 hours LATER 10.2 g 11   busPIRone  (BUSPAR ) 7.5 MG tablet Take 15 mg by mouth 3 (three) times daily.     cholecalciferol  (VITAMIN D3) 25 MCG (1000 UNIT) tablet Take 1,000 Units by mouth daily.     DULoxetine  (CYMBALTA ) 30 MG capsule Take 30 mg by mouth every evening. Take with 60 mg to equal 90 mg     DULoxetine  (CYMBALTA ) 60 MG capsule Take 60 mg by mouth every evening. Take with 30 mg to equal 90 mg     FEROSUL 325 (65 Fe) MG tablet Take 325 mg by mouth every other day.     fexofenadine (ALLEGRA) 180 MG tablet Take 180 mg by mouth daily.     fluticasone  (FLONASE) 50 MCG/ACT nasal spray Place 2  sprays into both nostrils daily as needed for allergies.      furosemide  (LASIX ) 40 MG tablet Take 1 tablet (40 mg total) by mouth daily as needed for edema. 30 tablet 5   gabapentin  (NEURONTIN ) 300 MG capsule Take 900 mg by mouth 3 (three) times daily.     isosorbide  mononitrate (ISMO ) 20 MG tablet Take 0.5 tablets (10 mg total) by mouth 2 (two) times daily at 10 AM and 5 PM.     metFORMIN (GLUCOPHAGE) 500 MG tablet Take 500 mg by mouth daily with breakfast.     metoprolol  tartrate (LOPRESSOR ) 25 MG tablet Take 0.5 tablets (12.5 mg total) by mouth 2 (two) times daily.     metroNIDAZOLE (METROCREAM) 0.75 % cream Apply 1 Application topically 2 (two) times daily.     nitroGLYCERIN  (NITROSTAT ) 0.4 MG SL tablet Place 1 tablet (0.4 mg total) under the tongue every 5 (five) minutes as needed for chest pain. 25 tablet 3   omeprazole  (PRILOSEC) 20 MG capsule Take 20 mg by mouth daily.     oxyCODONE -acetaminophen  (PERCOCET) 10-325 MG per tablet Take 1 tablet by mouth every 8 (eight) hours as needed for pain.     OXYGEN Inhale 3 L into the lungs daily.     prasugrel  (EFFIENT ) 10 MG TABS tablet Take 1 tablet (10 mg total) by mouth daily. 90 tablet 3   Tiotropium Bromide  Monohydrate (SPIRIVA  RESPIMAT) 2.5 MCG/ACT AERS Inhale 2 puffs into the lungs daily. 4 g 11   tiZANidine  (ZANAFLEX ) 4 MG tablet Take 4 mg by mouth 3 (three) times daily.     No facility-administered medications prior to visit.    PAST MEDICAL HISTORY: Past Medical History:  Diagnosis Date   Arthritis    Bilateral hand pain    CAD (coronary artery disease)    a. s/p NSTEMI in 2016 with DES to mid-RCA, DES to [facility] and DES to mid-LAD b. low-risk NST in 05/2016 and 11/2018   Cancer (HCC)    Skin   Cervical radiculopathy    CHF (congestive heart failure) (HCC)    Chronic neck and back pain    Diabetes mellitus without complication (HCC)    Emphysema lung (HCC)    Fibromyalgia    Hypertension    Kidney stones    hx of w  previous lithotripsy   MI (myocardial infarction) (HCC)    Pain management    [facility]   Thoracic back pain     PAST SURGICAL HISTORY: Past Surgical History:  Procedure Laterality Date   CARDIAC SURGERY     CORONARY STENT INTERVENTION N/A 04/14/2024  Procedure: CORONARY STENT INTERVENTION;  Surgeon: Mady Bruckner, MD;  Location: [facility];  Service: Cardiovascular;  Laterality: N/A;  RCA   CYSTOSCOPY WITH RETROGRADE PYELOGRAM, URETEROSCOPY AND STENT PLACEMENT Left 03/14/2015   Procedure: CYSTOSCOPY WITH LEFT RETROGRADE PYELOGRAM;  Surgeon: Glendia Elizabeth, MD;  Location: [facility];  Service: Urology;  Laterality: Left;   CYSTOSCOPY WITH RETROGRADE PYELOGRAM, URETEROSCOPY AND STENT PLACEMENT Left 03/20/2015   Procedure: CYSTOSCOPY WITH LEFT  RETROGRADE LEFT URETEROSCOPY AND LEFT  STENT ;  Surgeon: Norleen Seltzer, MD;  Location: [facility];  Service: Urology;  Laterality: Left;   HOLMIUM LASER APPLICATION Left 03/20/2015   Procedure: HOLMIUM LASER APPLICATION;  Surgeon: Norleen Seltzer, MD;  Location: [facility];  Service: Urology;  Laterality: Left;   LEFT HEART CATH AND CORONARY ANGIOGRAPHY N/A 04/14/2024   Procedure: LEFT HEART CATH AND CORONARY ANGIOGRAPHY;  Surgeon: Mady Bruckner, MD;  Location: [facility];  Service: Cardiovascular;  Laterality: N/A;   LEFT HEART CATHETERIZATION WITH CORONARY ANGIOGRAM N/A 10/25/2014   Procedure: LEFT HEART CATHETERIZATION WITH CORONARY ANGIOGRAM;  Surgeon: Bruckner JONETTA Cash, MD;  Location: [facility] CATH [facility];  Service: Cardiovascular;  Laterality: N/A;   LITHOTRIPSY  about 2011   SPINAL FUSION     L5-S1   TUBAL LIGATION      FAMILY HISTORY: Family History  Problem Relation Age of Onset   Diabetes Mother    Stroke Mother    Heart attack Brother     SOCIAL HISTORY: Social History   Socioeconomic History   Marital status: Divorced    Spouse name: Not on file   Number of children: Not on file   Years of education: Not on file   Highest education  level: Not on file  Occupational History   Not on file  Tobacco Use   Smoking status: Every Day    Current packs/day: 0.75    Average packs/day: 0.8 packs/day for 33.6 years (25.2 ttl pk-yrs)    Types: Cigarettes    Start date: 10/25/1990   Smokeless tobacco: Never   Tobacco comments:    Patient stated that she is down to 8 cigarettes a day.  Vaping Use   Vaping status: Some Days   Substances: Nicotine   Substance and Sexual Activity   Alcohol use: No    Alcohol/week: 0.0 standard drinks of alcohol   Drug use: No   Sexual activity: Not on file  Other Topics Concern   Not on file  Social History Narrative   Not on file   Social Drivers of Health   Financial Resource Strain: Not on file  Food Insecurity: No Food Insecurity (04/14/2024)   Hunger Vital Sign    Worried About Running Out of Food in the Last Year: Never true    Ran Out of Food in the Last Year: Never true  Transportation Needs: No Transportation Needs (04/14/2024)   PRAPARE - Administrator, Civil Service (Medical): No    Lack of Transportation (Non-Medical): No  Physical Activity: Not on file  Stress: Not on file  Social Connections: Moderately Isolated (04/14/2024)   Social Connection and Isolation Panel    Frequency of Communication with Friends and Family: More than three times a week    Frequency of Social Gatherings with Friends and Family: Never    Attends Religious Services: Never    Database administrator or Organizations: Yes    Attends Banker Meetings: Never    Marital Status: Divorced  Catering manager  Violence: Not At Risk (04/14/2024)   Humiliation, Afraid, Rape, and Kick questionnaire    Fear of Current or Ex-Partner: No    Emotionally Abused: No    Physically Abused: No    Sexually Abused: No      PHYSICAL EXAM Generalized: Well developed, in no acute distress   Neurological examination  Mentation: Alert oriented to time, place, history taking. Follows all  commands speech and language fluent Cranial nerve II-XII: Facial symmetry noted.   DIAGNOSTIC DATA (LABS, IMAGING, TESTING) - I reviewed patient records, labs, notes, testing and imaging myself where available.  Lab Results  Component Value Date   WBC 6.8 04/15/2024   HGB 13.3 04/15/2024   HCT 42.1 04/15/2024   MCV 91.7 04/15/2024   PLT 187 04/15/2024      Component Value Date/Time   NA 141 04/15/2024 0457   K 3.9 04/15/2024 0457   CL 105 04/15/2024 0457   CO2 23 04/15/2024 0457   GLUCOSE 84 04/15/2024 0457   BUN 10 04/15/2024 0457   CREATININE 0.79 04/15/2024 0457   CALCIUM  9.2 04/15/2024 0457   PROT 6.6 09/10/2023 1204   ALBUMIN  3.0 (L) 09/10/2023 1204   AST 23 09/10/2023 1204   ALT 31 09/10/2023 1204   ALKPHOS 93 09/10/2023 1204   BILITOT 0.8 09/10/2023 1204   GFRNONAA >60 04/15/2024 0457   GFRAA >60 07/30/2015 1218   Lab Results  Component Value Date   CHOL 69 04/15/2024   HDL 24 (L) 04/15/2024   LDLCALC 31 04/15/2024   TRIG 70 04/15/2024   CHOLHDL 2.9 04/15/2024   Lab Results  Component Value Date   HGBA1C 5.8 (H) 04/15/2024   No results found for: VITAMINB12 Lab Results  Component Value Date   TSH 2.092 10/24/2014      ASSESSMENT AND PLAN 53 y.o. year old female  has a past medical history of Arthritis, Bilateral hand pain, CAD (coronary artery disease), Cancer (HCC), Cervical radiculopathy, CHF (congestive heart failure) (HCC), Chronic neck and back pain, Diabetes mellitus without complication (HCC), Emphysema lung (HCC), Fibromyalgia, Hypertension, Kidney stones, MI (myocardial infarction) (HCC), Pain management, and Thoracic back pain. here with:  OSA on CPAP  CPAP compliance suboptimal Residual AHI slightly elevated most likely due to leak Encouraged patient to continue using CPAP nightly and > 4 hours each night F/U in 1 year or sooner if needed   Duwaine Russell, MSN, NP-C 06/14/2024, 2:36 PM [facility] [address]  [address] [place], [place] [address] [phone]  The patient's condition requires frequent monitoring and adjustments in the treatment plan, reflecting the ongoing complexity of care.  This provider is the continuing focal point for all needed services for this condition.

## 2024-06-14 NOTE — Patient Instructions (Signed)
 Continue using CPAP nightly and greater than 4 hours each night If your symptoms worsen or you develop new symptoms please let us  know.

## 2024-06-22 ENCOUNTER — Other Ambulatory Visit (HOSPITAL_COMMUNITY): Payer: Self-pay

## 2024-06-23 ENCOUNTER — Other Ambulatory Visit (HOSPITAL_COMMUNITY): Payer: Self-pay

## 2024-06-30 ENCOUNTER — Ambulatory Visit (HOSPITAL_COMMUNITY)
Admission: RE | Admit: 2024-06-30 | Discharge: 2024-06-30 | Disposition: A | Discharge status: Home / Self Care | Source: Ambulatory Visit | Attending: Internal Medicine | Admitting: Internal Medicine

## 2024-06-30 ENCOUNTER — Ambulatory Visit (INDEPENDENT_AMBULATORY_CARE_PROVIDER_SITE_OTHER): Admitting: Internal Medicine

## 2024-06-30 ENCOUNTER — Encounter: Payer: Self-pay | Admitting: Internal Medicine

## 2024-06-30 VITALS — BP 86/59 | HR 73 | Ht 62.0 in | Wt 181.4 lb

## 2024-06-30 DIAGNOSIS — J9612 Chronic respiratory failure with hypercapnia: Secondary | ICD-10-CM

## 2024-06-30 DIAGNOSIS — R0609 Other forms of dyspnea: Secondary | ICD-10-CM | POA: Diagnosis present

## 2024-06-30 DIAGNOSIS — J9611 Chronic respiratory failure with hypoxia: Secondary | ICD-10-CM | POA: Diagnosis not present

## 2024-06-30 DIAGNOSIS — I10 Essential (primary) hypertension: Secondary | ICD-10-CM | POA: Diagnosis not present

## 2024-06-30 DIAGNOSIS — F17210 Nicotine dependence, cigarettes, uncomplicated: Secondary | ICD-10-CM

## 2024-06-30 DIAGNOSIS — J449 Chronic obstructive pulmonary disease, unspecified: Secondary | ICD-10-CM

## 2024-06-30 MED ORDER — PREDNISONE 10 MG PO TABS
ORAL_TABLET | ORAL | 0 refills | Status: AC
Start: 2024-06-30 — End: ?

## 2024-06-30 MED ORDER — AZITHROMYCIN 250 MG PO TABS: ORAL_TABLET | ORAL | 0 refills | Status: AC

## 2024-06-30 NOTE — Progress Notes (Signed)
 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 Sent home from hospital on 2LO2 cont    Dyspnea:  with 02 2lpm doing better and able to do [employer]/ uses [facility] parking  Cough: periodically yellowish brown  Sleep: on cpap s 02 SABA use: very confused with instructions on how/when to use which one but neb helps the most 02 :  2lpm daytime but not adjusting  Rec My office will be contacting you by phone for referral for PFTs    Plan A = Automatic = Always=    Breztri  Take 2 puffs first thing in am and then another 2 puffs about 12 hours later.   Work on inhaler technique:   Plan B = Backup (to supplement plan A, not to replace it) Only use your albuterol  inhaler as a rescue medication Plan C = Crisis (instead of Plan B but only if Plan B stops working) - only use your albuterol  nebulizer if you first try Plan B  Make sure you check your oxygen saturation  AT  your highest level of activity (not after you stop)   to be sure it stays over 90%  Plug 02 into your cpap @ 2lpm - call 02 supplier to get the right connection  The key is to stop smoking completely before smoking completely stops you!        10/06/2023  f/u ov/[place] office/Rahiem Schellinger re: COPD 0/AB maint on symbicort  80  Take 2 puffs first thing in am and then another 2 puffs about 12 hours later/still smoking  Chief Complaint  Patient presents with   COPD   Shortness of Breath  Dyspnea:  has not been back walking at  [employer] yet  Cough: clear  mucus only   Sleeping: bed blocks on side one pillow s  resp cc on cpap (see download 10/06/2023 )  SABA use: none  02: 3lpm except when sleeping on cpap  Rec My office will be contacting you by phone for referral for overnight oximetry on cpap   Make sure you check your oxygen saturation  AT  your highest level of activity (not after you stop)   to be sure it stays over 90%  Add spiriva  2 puffs  in am only, and only after you take symbiocrt 80 x 2 puffs first  Work on inhaler technique:    The key is to stop smoking completely before smoking completely stops you!   Download 10/06/23  - Usage days 20/30 / avg 5 h 34 min on days used, AHI 5.7 on CPAP  9 cm     12/31/2023  f/u ov/[place] office/Rowdy Guerrini re: COPD / AB  maint on symbicort  80 / spiriva  each am   Chief Complaint  Patient presents with   Follow-up  Dyspnea:  no [employer]  Cough: minimal mucoid esp in am clears p couple of coughs  Sleeping: on cpap   s resp cc  SABA use: not using  02:  3lpm daytime (not while on cpap)  Lung cancer screening: not due until 09/09/24 since had CTa 11/21/124  Rec Make sure you check your oxygen saturation  AT  your highest level of activity (not after you stop)   to be sure it stays over 90%      - 12/31/2023   Walked on 3lpm   x  3  lap(s) =  approx 450  ft  @ moderate  pace, stopped due to desats  with lowest 02 sats 87%  >>>  increased flow to to 4lpm and was able to walk another lap >>>> with sats 90% at end.   The key is to stop smoking completely before smoking completely stops you!   06/30/2024  f/u ov/[place] office/Marisah Laker re: COPD GOLD 0  / ab  maint on symbicort  160 /spriiva   still  smoker  Chief Complaint  Patient presents with   Cough    Mucus color brown - shob  Dyspnea:  less activity  a wek Cough: darker x a week worse in am  Sleeping:  bed blocs and one pillow noct s   resp cc  SABA use: not tried yet  02: 3;lpm 24/7      No obvious day to day or daytime  variability or assoc   mucus plugs or hemoptysis or cp or chest tightness, subjective wheeze or overt sinus or hb symptoms.    Also denies any obvious fluctuation of symptoms with weather or environmental changes or other aggravating or alleviating factors except as outlined above   No unusual exposure hx or h/o childhood pna/ asthma or knowledge of premature birth.  Current Allergies, Complete Past Medical History, Past Surgical History, Family History, and Social History were reviewed in [employer] record.  ROS  The following are not active complaints unless bolded Hoarseness, sore throat, dysphagia, dental problems, itching, sneezing,  nasal congestion or discharge of excess mucus or purulent secretions, ear ache,   fever, chills, sweats, unintended wt loss or wt gain, classically pleuritic or exertional cp,  orthopnea pnd or arm/hand swelling  or leg swelling, presyncope remotely, palpitations, abdominal pain, anorexia, nausea, vomiting, diarrhea  or change in bowel habits or change in bladder habits, change in stools or change in urine, dysuria, hematuria,  rash, arthralgias, visual complaints, headache, numbness, weakness or ataxia or problems with walking or coordination,  change in mood or  memory.        Current Meds  Medication Sig   albuterol  (VENTOLIN  HFA) 108 (90 Base) MCG/ACT inhaler Inhale 2 puffs into the lungs every 4 (four) hours as needed for shortness of breath or wheezing.   aspirin  EC 81 MG tablet Take 1 tablet (81 mg total) by mouth daily. Swallow whole.   atorvastatin  (LIPITOR ) 80 MG tablet Take 1 tablet (80 mg total) by mouth daily at 6 PM.   budesonide -formoterol  (SYMBICORT ) 80-4.5 MCG/ACT inhaler INHALE TWO PUFFS first thing in IN THE MORNING; THEN TWO puffs 12 hours LATER   busPIRone  (BUSPAR ) 7.5 MG tablet Take 15 mg by mouth 3 (three) times daily.   cholecalciferol  (VITAMIN D3) 25 MCG (1000 UNIT) tablet Take 1,000 Units by mouth daily.    DULoxetine  (CYMBALTA ) 30 MG capsule Take 30 mg by mouth every evening. Take with 60 mg to equal 90 mg   DULoxetine  (CYMBALTA ) 60 MG capsule Take 60 mg by mouth every evening. Take with 30 mg to equal 90 mg  FEROSUL 325 (65 Fe) MG tablet Take 325 mg by mouth every other day.   fexofenadine (ALLEGRA) 180 MG tablet Take 180 mg by mouth daily.   fluticasone  (FLONASE) 50 MCG/ACT nasal spray Place 2 sprays into both nostrils daily as needed for allergies.    furosemide  (LASIX ) 40 MG tablet Take 1 tablet (40 mg total) by mouth daily as needed for edema.   gabapentin  (NEURONTIN ) 300 MG capsule Take 900 mg by mouth 3 (three) times daily.   metFORMIN (GLUCOPHAGE) 500 MG tablet Take 500 mg by mouth daily with breakfast.   metoprolol  tartrate (LOPRESSOR ) 25 MG tablet Take 0.5 tablets (12.5 mg total) by mouth 2 (two) times daily.   metroNIDAZOLE (METROCREAM) 0.75 % cream Apply 1 Application topically 2 (two) times daily.   nitroGLYCERIN  (NITROSTAT ) 0.4 MG SL tablet Place 1 tablet (0.4 mg total) under the tongue every 5 (five) minutes as needed for chest pain.   omeprazole  (PRILOSEC) 20 MG capsule Take 20 mg by mouth daily.   oxyCODONE -acetaminophen  (PERCOCET) 10-325 MG per tablet Take 1 tablet by mouth every 8 (eight) hours as needed for pain.   OXYGEN Inhale 3 L into the lungs daily.   prasugrel  (EFFIENT ) 10 MG TABS tablet Take 1 tablet (10 mg total) by mouth daily.   Tiotropium Bromide  Monohydrate (SPIRIVA  RESPIMAT) 2.5 MCG/ACT AERS Inhale 2 puffs into the lungs daily.   tiZANidine  (ZANAFLEX ) 4 MG tablet Take 4 mg by mouth 3 (three) times daily.             Past Medical History:  Diagnosis Date   Arthritis    Bilateral hand pain    CAD (coronary artery disease)    a. s/p NSTEMI in 2016 with DES to mid-RCA, DES to [facility] and DES to mid-LAD b. low-risk NST in 05/2016 and 11/2018   Cervical radiculopathy    Chronic neck and back pain    Emphysema lung (HCC)    Fibromyalgia    Hypertension     Kidney stones    hx of w previous lithotripsy   MI (myocardial infarction) (HCC)    Pain management    [facility]   Thoracic back pain        Objective:    Wts  06/30/2024       181   12/31/2023       178  10/06/2023     175  04/08/2023       176    09/29/22 170 lb 9.6 oz (77.4 kg)  08/12/22 169 lb 9.6 oz (76.9 kg)  08/07/22 168 lb 6.4 oz (76.4 kg)    Vital signs reviewed  06/30/2024  - Note at rest 02 sats  91% on 3lpm pulsed   Pale elderly wf amb on 2  wheeled walker   Distant bs    HEENT : Oropharynx  clear         NECK :  without  apparent JVD/ palpable Nodes/TM    LUNGS: no acc muscle use,  Nl contour chest which is clear to A and P bilaterally without cough on insp or exp maneuvers   CV:  RRR  no s3 or murmur or increase in P2, and no edema   ABD:  soft and nontender   MS:    ext warm without deformities Or obvious joint restrictions  calf tenderness, cyanosis or clubbing    SKIN: warm and dry without lesions    NEURO:  alert, approp, nl sensorium with  no motor or  cerebellar deficits apparent.     CXR PA and Lateral:   06/30/2024 :    I personally reviewed images and impression is as follows:      Slt increase bronchial markings/ mild CM   Assessment     Assessment & Plan Chronic respiratory failure with hypoxia and hypercapnia (HCC) HC03  08/12/2022   =  32  - 08/12/2022 patient walked at a moderate pace on 2LO2 cont x 450 ft ft with . SOB starting after 150 ft but did not stop with  lowest sats 94%  - ono on cpap and no 02 10/06/2023 >>>  - 12/31/2023   Walked on 3lpm   x  3  lap(s) =  approx 450  ft  @ moderate  pace, stopped due to desats  with lowest 02 sats 87%  >>>  increased flow to to 4lpm and was able to walk the 3 lap oxygen with sats 90% at end.  Adequate control on present rx, reviewed in detail with pt > no change in rx needed   COPD GOLD 0 / active smoker/AB  Active smoker with PET CT c/w emphysema and AB 07/31/22  - 08/12/2022  After extensive  coaching inhaler device,  effectiveness =    75% so try breztri  2bid and approp saba - PFT's  09/29/22   FEV1 2.29 (89 % ) ratio 0.84  p 0 % improvement from saba p Breztri   prior to study with DLCO  7.25 (37%)   and FV curve nl  - 04/08/2023  After extensive coaching inhaler device,  effectiveness =   75% > try symbicort  80  - 10/06/2023  After extensive coaching inhaler device,  effectiveness =    75% from a baseline of 0 try adding spiriva  2.5 x 2 each am to symbicort  to see if improves any of her symptoms for which cardiac asthma may be a component ([facility] drug of choice)  - rx exac 06/30/2024 with pred x6 d, zpak    Group D (now reclassified as E) in terms of symptom/risk and laba/lama/ICS  therefore appropriate rx at this point >>>  continue symb 160 /spiriva  but needs to be more aggressive with hfa with flares of AB /advised      DOE (dyspnea on exertion)  Essential hypertension Try off acei 09/29/2022 due to airway symptoms >> pft changes   >>> BPlow today, pt looks pale ? Now dehydrated so cbc/ bmet and hold lopressor  when bp down    Cigarette smoker Counseled re importance of smoking cessation but did not meet time criteria for separate billing    Each maintenance medication was reviewed in detail including emphasizing most importantly the difference between maintenance and prns and under what circumstances the prns are to be triggered using an action plan format where appropriate.  Total time for H and P, chart review, counseling, reviewing 02/ pulse ox/ smi/ hfa  device(s) , directly observing portions of ambulatory 02 saturation study/ and generating customized AVS unique to this office visit / same day charting = 35 min                   AVS  Patient Instructions  Please remember to go to the  x-ray department  @  [facility] for your tests - we will call you with the results when they are available     Zpak   Prednisone  10 mg take  4 each am x 2 days,   2 each  am x 2 days,  1 each am x 2 days and stop   The key is to stop smoking completely before smoking completely stops you!    Make sure you check your oxygen saturation  AT  your highest level of activity (not after you stop)   to be sure it stays over 90% and adjust  02 flow upward to maintain this level if needed but remember to turn it back to previous settings when you stop (to conserve your supply).   Please schedule a follow up visit in 6  months but call sooner if needed    Ozell America, MD 06/30/2024

## 2024-06-30 NOTE — Assessment & Plan Note (Addendum)
 HC03  08/12/2022   =  32  - 08/12/2022 patient walked at a moderate pace on 2LO2 cont x 450 ft ft with . SOB starting after 150 ft but did not stop with  lowest sats 94%  - ono on cpap and no 02 10/06/2023 >>>  - 12/31/2023   Walked on 3lpm   x  3  lap(s) =  approx 450  ft  @ moderate  pace, stopped due to desats  with lowest 02 sats 87%  >>>  increased flow to to 4lpm and was able to walk the 3 lap oxygen with sats 90% at end.  Adequate control on present rx, reviewed in detail with pt > no change in rx needed

## 2024-06-30 NOTE — Patient Instructions (Addendum)
 Please remember to go to the  x-ray department  @  [facility] for your tests - we will call you with the results when they are available     Zpak   Prednisone  10 mg take  4 each am x 2 days,   2 each am x 2 days,  1 each am x 2 days and stop   The key is to stop smoking completely before smoking completely stops you!    Make sure you check your oxygen saturation  AT  your highest level of activity (not after you stop)   to be sure it stays over 90% and adjust  02 flow upward to maintain this level if needed but remember to turn it back to previous settings when you stop (to conserve your supply).   Please schedule a follow up visit in 6  months but call sooner if needed

## 2024-06-30 NOTE — Assessment & Plan Note (Addendum)
 Active smoker with PET CT c/w emphysema and AB 07/31/22  - 08/12/2022  After extensive coaching inhaler device,  effectiveness =    75% so try breztri  2bid and approp saba - PFT's  09/29/22   FEV1 2.29 (89 % ) ratio 0.84  p 0 % improvement from saba p Breztri   prior to study with DLCO  7.25 (37%)   and FV curve nl  - 04/08/2023  After extensive coaching inhaler device,  effectiveness =   75% > try symbicort  80  - 10/06/2023  After extensive coaching inhaler device,  effectiveness =    75% from a baseline of 0 try adding spiriva  2.5 x 2 each am to symbicort  to see if improves any of her symptoms for which cardiac asthma may be a component (L[facility] drug of choice)  - rx exac 06/30/2024 with pred x6 d, zpak    Group D (now reclassified as E) in terms of symptom/risk and laba/lama/ICS  therefore appropriate rx at this point >>>  continue symb 160 /spiriva  but needs to be more aggressive with hfa with flares of AB arthurine

## 2024-06-30 NOTE — Assessment & Plan Note (Addendum)
 Counseled re importance of smoking cessation but did not meet time criteria for separate billing    Each maintenance medication was reviewed in detail including emphasizing most importantly the difference between maintenance and prns and under what circumstances the prns are to be triggered using an action plan format where appropriate.  Total time for H and P, chart review, counseling, reviewing 02/ pulse ox/ smi/ hfa  device(s) , directly observing portions of ambulatory 02 saturation study/ and generating customized AVS unique to this office visit / same day charting = 35 min

## 2024-06-30 NOTE — Assessment & Plan Note (Addendum)
 Try off acei 09/29/2022 due to airway symptoms >> pft changes   >>> BPlow today, pt looks pale ? Now dehydrated so cbc/ bmet and hold lopressor  when bp down

## 2024-07-01 ENCOUNTER — Ambulatory Visit: Payer: Self-pay | Admitting: Internal Medicine

## 2024-07-01 LAB — CBC WITH DIFFERENTIAL/PLATELET
Basophils Absolute: 0 x10E3/uL (ref 0.0–0.2)
Basos: 0 %
EOS (ABSOLUTE): 0 x10E3/uL (ref 0.0–0.4)
Eos: 0 %
Hematocrit: 48.7 % — ABNORMAL HIGH (ref 34.0–46.6)
Hemoglobin: 15.2 g/dL (ref 11.1–15.9)
Immature Grans (Abs): 0 x10E3/uL (ref 0.0–0.1)
Immature Granulocytes: 0 %
Lymphocytes Absolute: 2.1 x10E3/uL (ref 0.7–3.1)
Lymphs: 22 %
MCH: 29.6 pg (ref 26.6–33.0)
MCHC: 31.2 g/dL — ABNORMAL LOW (ref 31.5–35.7)
MCV: 95 fL (ref 79–97)
Monocytes Absolute: 0.5 x10E3/uL (ref 0.1–0.9)
Monocytes: 6 %
Neutrophils Absolute: 6.7 x10E3/uL (ref 1.4–7.0)
Neutrophils: 72 %
Platelets: 242 x10E3/uL (ref 150–450)
RBC: 5.14 x10E6/uL (ref 3.77–5.28)
RDW: 14.7 % (ref 11.7–15.4)
WBC: 9.4 x10E3/uL (ref 3.4–10.8)

## 2024-07-01 LAB — BASIC METABOLIC PANEL WITH GFR
BUN/Creatinine Ratio: 10 (ref 9–23)
BUN: 8 mg/dL (ref 6–24)
CO2: 23 mmol/L (ref 20–29)
Calcium: 9.1 mg/dL (ref 8.7–10.2)
Chloride: 100 mmol/L (ref 96–106)
Creatinine, Ser: 0.8 mg/dL (ref 0.57–1.00)
Glucose: 95 mg/dL (ref 70–99)
Potassium: 5.1 mmol/L (ref 3.5–5.2)
Sodium: 141 mmol/L (ref 134–144)
eGFR: 87 mL/min/1.73 (ref >59)

## 2024-07-01 NOTE — Progress Notes (Signed)
 Spoke with pt and relayed results pt confirmed understanding. nfn

## 2024-07-08 NOTE — Progress Notes (Signed)
 Called and spoke with pt and relayed results to her, pt confirmed understanding. nfn

## 2024-08-05 ENCOUNTER — Ambulatory Visit: Admitting: Nurse Practitioner

## 2024-09-22 ENCOUNTER — Ambulatory Visit: Admitting: Cardiology

## 2024-10-05 ENCOUNTER — Other Ambulatory Visit: Payer: Self-pay | Admitting: Internal Medicine

## 2024-10-28 ENCOUNTER — Encounter: Payer: Self-pay | Admitting: Internal Medicine

## 2024-11-04 ENCOUNTER — Encounter: Payer: Self-pay | Admitting: Cardiology

## 2024-11-04 ENCOUNTER — Ambulatory Visit: Attending: Cardiology | Admitting: Cardiology

## 2024-11-04 ENCOUNTER — Telehealth: Payer: Self-pay | Admitting: Cardiology

## 2024-11-04 ENCOUNTER — Other Ambulatory Visit: Payer: Self-pay | Admitting: Cardiology

## 2024-11-04 ENCOUNTER — Ambulatory Visit

## 2024-11-04 VITALS — BP 118/78 | HR 75 | Ht 62.5 in | Wt 182.2 lb

## 2024-11-04 DIAGNOSIS — I5032 Chronic diastolic (congestive) heart failure: Secondary | ICD-10-CM | POA: Diagnosis not present

## 2024-11-04 DIAGNOSIS — E782 Mixed hyperlipidemia: Secondary | ICD-10-CM

## 2024-11-04 DIAGNOSIS — R0602 Shortness of breath: Secondary | ICD-10-CM | POA: Diagnosis not present

## 2024-11-04 DIAGNOSIS — R002 Palpitations: Secondary | ICD-10-CM

## 2024-11-04 DIAGNOSIS — I2510 Atherosclerotic heart disease of native coronary artery without angina pectoris: Secondary | ICD-10-CM

## 2024-11-04 NOTE — Patient Instructions (Addendum)
 Medication Instructions:   Continue all current medications.   Labwork:  none  Testing/Procedures:  Your physician has recommended that you wear a 7 day event monitor. Event monitors are medical devices that record the hearts electrical activity. Doctors most often us  these monitors to diagnose arrhythmias. Arrhythmias are problems with the speed or rhythm of the heartbeat. The monitor is a small, portable device. You can wear one while you do your normal daily activities. This is usually used to diagnose what is causing palpitations/syncope (passing out). Your physician has requested that you have an echocardiogram. Echocardiography is a painless test that uses sound waves to create images of your heart. It provides your doctor with information about the size and shape of your heart and how well your hearts chambers and valves are working. This procedure takes approximately one hour. There are no restrictions for this procedure. Please do NOT wear cologne, perfume, aftershave, or lotions (deodorant is allowed). Please arrive 15 minutes prior to your appointment time.  Please note: We ask at that you not bring children with you during ultrasound (echo/ vascular) testing. Due to room size and safety concerns, children are not allowed in the ultrasound rooms during exams. Our front office staff cannot provide observation of children in our lobby area while testing is being conducted. An adult accompanying a patient to their appointment will only be allowed in the ultrasound room at the discretion of the ultrasound technician under special circumstances. We apologize for any inconvenience. Office will contact with results via phone, letter or mychart.     Follow-Up:  6 weeks   Any Other Special Instructions Will Be Listed Below (If Applicable).  Please send BP via mychart for provider review.   If you need a refill on your cardiac medications before your next appointment, please call your  pharmacy.

## 2024-11-04 NOTE — Telephone Encounter (Signed)
 Checking percert on the following   7 day zio - palps

## 2024-11-04 NOTE — Progress Notes (Signed)
 "     Clinical Summary Tanya Lowe is a 54 y.o.female seen today for follow up of the following medical problems.     1. CAD - history of NSTEMI Jan 2016, s/p DES to mid RCA and DES to distal RCA and DES to mid LAD.   - 04/2015 echo LVEF 55-60% -11/2015 DSE without ischemia - 05/2016 nuclear stress [place] no clear ischemia, limited by signicant gut radiotrace uptake   11/2018 nuclear stress: no ischemia      Jan 2022 echo: LVEF 65-70%, no WMAs, normal diastolic, normal RV    Jan 2025 nuclear stress: anterior infarct with very mild peri-infarct ischemia.    - admit 03/2024 with chest pain - 03/2024 cath: LM normal. Patent LAD stent, LCX patent, OM2 60%, RCA 25% and distal 90%,   -PCI to distal RCA extending into RPDA using Synergy 2.25 x 28 mm drug-eluting stent with 0% residual stenosis and TIMI-3 flow in the RCA and RPDA.  RPLAV wire dissection occurred with transient loss of flow.  Flow was reestablished with angioplasty at the ostium with 50% residual stenosis and TIMI-1 flow    -03/2024 echo: LVEF 60-65%, no WMAs, D shaped septum, severe RV dysfunction  -no chest pains, chronic SOB. On 3L at home - compliant with meds     2. COPD on home O2 3L - followed by Dr Darlean Some recent SOB, recently diagnosed with emphysema.  12/2021. DOE with activities, occasional cough/wheezing. CT chest did show some emphysema. Will use prn albuterol  with benefit.  09/2022 PFTs: severe diffusion defect - breathing much improve breztri       3. HTN -due to low bp's we lowered imdur  to 15mg  daily, lopressor  to 12.5mg  bid.  - now off isosorbide  - taking zanaflex  at home tid - home bp's 110s/70s. Variable bp's at home,  at times can be in the 90s. Can feel lightheaded, dizzy about 1-2 times per week, improved.  - working to stay well hydrated.    4. Palpitations - home bp monit - 2-3 times per week has palpitatoins, lasts just a few seconds.    4. Hyperlipidemia - 03/2024 TC 68 TG 70 HDL 24 LDL  31   5. OSA - followed by Dr Buck neurology - she remains compliant with CPAP   6. Chronic back pain - prior nerve stimulator surgery     7. DM2 - followed by pcp   8. HFpEF  - 06/2022 echo: LVEF 65-70%, mild to mod RV dysfunction, diastolic not described  - no recent edema - chronic SOB on home O2 for COPD   - admit 08/2023 with volume overload, BNP 1340. Up 7 lbs from her baseline - 08/2023 echo: LVEF 55-60%, normal diastolic fxn, mod RV dysfunction and mod RVE, severe pulm HTN PASP 75 - no recent edema. Home weight 168 lbs and stable . - lasix  40mg  daily stopped during recent admission, unclear indication.   - recent LE edema. Has lasix  prn, has not taken few months.      9. Pulmonary HTN - 06/2022 echo: mild RVE, mild to mod RV dysfunction      PASP 34 -  08/2023 echo: LVEF 55-60%, normal diastolic fxn, mod RV dysfunction and mod RVE, severe pulm HTN PASP 75 - 08/2023 CT PE no PE. paving pattern of bilateral lungs. Differential diagnosis includes pulmonary edema, ARDS, acute interstitial pneumonia, pulmonary alveolar proteinosis - 08/2023 VQ: negative - known history of HFpEF, OSA, COPD. 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   Past Medical History:  Diagnosis Date   Arthritis    Bilateral hand pain    CAD (coronary artery disease)    a. s/p NSTEMI in 2016 with DES to mid-RCA, DES to [facility] and DES to mid-LAD b. low-risk NST in 05/2016 and 11/2018   Cancer (HCC)    Skin   Cervical radiculopathy    CHF (congestive heart failure) (HCC)    Chronic neck and back pain    Diabetes mellitus without complication (HCC)    Emphysema lung (HCC)    Fibromyalgia    Hypertension    Kidney stones    hx of w previous lithotripsy   MI (myocardial infarction) (HCC)    Pain management    [facility]   Thoracic back pain      Allergies[1]   Current Outpatient Medications  Medication Sig Dispense Refill   albuterol  (VENTOLIN  HFA) 108 (90 Base) MCG/ACT inhaler  Inhale 2 puffs into the lungs every 4 (four) hours as needed for shortness of breath or wheezing.     aspirin  EC 81 MG tablet Take 1 tablet (81 mg total) by mouth daily. Swallow whole. 90 tablet 3   atorvastatin  (LIPITOR ) 80 MG tablet Take 1 tablet (80 mg total) by mouth daily at 6 PM. 30 tablet 11   azithromycin  (ZITHROMAX ) 250 MG tablet Take 2 on day one then 1 daily x 4 days 6 tablet 0   budesonide -formoterol  (SYMBICORT ) 80-4.5 MCG/ACT inhaler INHALE TWO PUFFS first thing in IN THE MORNING; THEN TWO puffs 12 hours LATER 10.2 g 11   busPIRone  (BUSPAR ) 7.5 MG tablet Take 15 mg by mouth 3 (three) times daily.     cholecalciferol  (VITAMIN D3) 25 MCG (1000 UNIT) tablet Take 1,000 Units by mouth daily.     DULoxetine  (CYMBALTA ) 30 MG capsule Take 30 mg by mouth every evening. Take with 60 mg to equal 90 mg     DULoxetine  (CYMBALTA ) 60 MG capsule Take 60 mg by mouth every evening. Take with 30 mg to equal 90 mg     FEROSUL 325 (65 Fe) MG tablet Take 325 mg by mouth every other day.     fexofenadine (ALLEGRA) 180 MG tablet Take 180 mg by mouth daily.     fluticasone  (FLONASE) 50 MCG/ACT nasal spray Place 2 sprays into both nostrils daily as needed for allergies.      furosemide  (LASIX ) 40 MG tablet Take 1 tablet (40 mg total) by mouth daily as needed for edema. 30 tablet 5   gabapentin  (NEURONTIN ) 300 MG capsule Take 900 mg by mouth 3 (three) times daily.     metFORMIN (GLUCOPHAGE) 500 MG tablet Take 500 mg by mouth daily with breakfast.     metoprolol  tartrate (LOPRESSOR ) 25 MG tablet Take 0.5 tablets (12.5 mg total) by mouth 2 (two) times daily.     metroNIDAZOLE (METROCREAM) 0.75 % cream Apply 1 Application topically 2 (two) times daily.     nitroGLYCERIN  (NITROSTAT ) 0.4 MG SL tablet Place 1 tablet (0.4 mg total) under the tongue every 5 (five) minutes as needed for chest pain. 25 tablet 3   omeprazole  (PRILOSEC) 20 MG capsule Take 20 mg by mouth daily.     oxyCODONE -acetaminophen  (PERCOCET) 10-325  MG per tablet Take 1 tablet by mouth every 8 (eight) hours as needed for pain.     OXYGEN Inhale 3 L into the lungs daily.     prasugrel  (EFFIENT ) 10 MG TABS tablet Take 1 tablet (10 mg total)  by mouth daily. 90 tablet 3   predniSONE  (DELTASONE ) 10 MG tablet Take  4 each am x 2 days,   2 each am x 2 days,  1 each am x 2 days and stop 14 tablet 0   Tiotropium Bromide  (SPIRIVA  RESPIMAT) 2.5 MCG/ACT AERS INHALE TWO PUFFS INTO THE LUNGS DAILY 4 g 4   tiZANidine  (ZANAFLEX ) 4 MG tablet Take 4 mg by mouth 3 (three) times daily.     No current facility-administered medications for this visit.     Past Surgical History:  Procedure Laterality Date   CARDIAC SURGERY     CORONARY STENT INTERVENTION N/A 04/14/2024   Procedure: CORONARY STENT INTERVENTION;  Surgeon: Mady Bruckner, MD;  Location: [facility];  Service: Cardiovascular;  Laterality: N/A;  RCA   CYSTOSCOPY WITH RETROGRADE PYELOGRAM, URETEROSCOPY AND STENT PLACEMENT Left 03/14/2015   Procedure: CYSTOSCOPY WITH LEFT RETROGRADE PYELOGRAM;  Surgeon: Glendia Elizabeth, MD;  Location: [facility];  Service: Urology;  Laterality: Left;   CYSTOSCOPY WITH RETROGRADE PYELOGRAM, URETEROSCOPY AND STENT PLACEMENT Left 03/20/2015   Procedure: CYSTOSCOPY WITH LEFT  RETROGRADE LEFT URETEROSCOPY AND LEFT  STENT ;  Surgeon: Norleen Seltzer, MD;  Location: [facility];  Service: Urology;  Laterality: Left;   HOLMIUM LASER APPLICATION Left 03/20/2015   Procedure: HOLMIUM LASER APPLICATION;  Surgeon: Norleen Seltzer, MD;  Location: [facility];  Service: Urology;  Laterality: Left;   LEFT HEART CATH AND CORONARY ANGIOGRAPHY N/A 04/14/2024   Procedure: LEFT HEART CATH AND CORONARY ANGIOGRAPHY;  Surgeon: Mady Bruckner, MD;  Location: [facility];  Service: Cardiovascular;  Laterality: N/A;   LEFT HEART CATHETERIZATION WITH CORONARY ANGIOGRAM N/A 10/25/2014   Procedure: LEFT HEART CATHETERIZATION WITH CORONARY ANGIOGRAM;  Surgeon: Bruckner JONETTA Cash, MD;  Location: [facility] CATH  LAB;  Service: Cardiovascular;  Laterality: N/A;   LITHOTRIPSY  about 2011   SPINAL FUSION     L5-S1   TUBAL LIGATION       Allergies[2]    Family History  Problem Relation Age of Onset   Diabetes Mother    Stroke Mother    Heart attack Brother      Social History Ms. Marcon reports that she has been smoking cigarettes. She started smoking about 34 years ago. She has a 25.5 pack-year smoking history. She has never used smokeless tobacco. Ms. Mountz reports no history of alcohol use.    Physical Examination Today's Vitals   11/04/24 0957  BP: 118/78  Pulse: 75  SpO2: 92%  Weight: 182 lb 3.2 oz (82.6 kg)  Height: 5' 2.5 (1.588 m)  PainSc: 0-No pain   Body mass index is 32.79 kg/m.  Gen: resting comfortably, no acute distress HEENT: no scleral icterus, pupils equal round and reactive, no palptable cervical adenopathy,  CV: RRR, no m/rg, +JVD Resp: Clear to auscultation bilaterally GI: abdomen is soft, non-tender, non-distended, normal bowel sounds, no hepatosplenomegaly MSK: extremities are warm, 2+ bilateral LE edema Skin: warm, no rash Neuro:  no focal deficits Psych: appropriate affect   Diagnostic Studies  Jan 2016 Cath PCI Note: She was given an additional 5000 units IV heparin  x 1. ACT was over 200. She was given Brilinta  180 mg po x 1.   Lesion # 1 Distal RCA: JR4 guiding catheter used to engage the RCA. [place]ar IC wire down the RCA. 2.0 x 15 mm balloon x 1 for pre-dilatation. 2.25 x 16 mm Promus Premier DES x 1 distal RCA post-dilated with a 2.5 x 12 mm  [place] balloon x 1. Stenosis taken from 90% down to 0%.   Lesion # 2 mid RCA: 2.0 x 15 mm balloon x 1. 3.0 x 12 mm Promus Premier DES x 1. The mid stenosis was followed by an aneurysmal segment with a size mismatch before the distal vessel so stents were not overlapped. Post-dilatation with a 3.5 x 8 mm [place] balloon x 1. Stenosis taken from 95% down to 0%.   Lesion #3 mid LAD: XB LAD 3.5 guiding catheter to engage  left main. 2.5 x 12 mm balloon x 1 for pre-dilatation. 3.5 x 16 mm Promus Premier DES x 1 mid LAD. Stent post-dilated with a 3.5 x 8 mm [place] balloon x 2. Stenosis taken from 80% down to 0%.   The sheath was removed from the right radial artery and a Terumo hemostasis band was applied at the arteriotomy site on the right wrist. There were no immediate complications. The patient was taken to the recovery area in stable condition.   Hemodynamic Findings: Central aortic pressure: 115/66 Left ventricular pressure: 121/3/14   Angiographic Findings:   Left main: No obstructive disease.     Left Anterior Descending Artery: Large caliber vessel that courses to the apex. 80% mid stenosis. Small caliber diagonal [place]. The distal LAD tapers to a small caliber vessel.     Circumflex Artery: Large caliber vessel with 20% proximal stenosis.     Right Coronary Artery: Large dominant vessel with diffuse 30% proximal stenosis, 95% mid stenosis followed by an aneurysmal segment. Distal 90% stenosis. Diffuse mild plaque in the posterolateral artery and PDA.   Left Ventricular Angiogram: LVEF=55-60%.   Impression: 1. Severe double vessel CAD 2. NSTEMI 3. Normal LV systolic function 4. Successful PTCA/DES x 1 mid RCA and PTCA/DES x 1 distal RCA (stents not overlapped due to size mismatch in the vessel and the presence of an aneurysmal segment in the distal segment of the mid vessel) 5. Successful PTCA/DES x 1 mid LAD   Recommendations: Will need dual anti-platelet therapy with ASA and Brilinta  for at least one year. Continue beta blocker and statin. Tobacco cessation.          04/2015 echo Study Conclusions  - Left ventricle: The cavity size was normal. Wall thickness was   increased in a pattern of mild LVH. Systolic function was normal.   The estimated ejection fraction was in the range of 55% to 60%.   Wall motion was normal; there were no regional wall motion   abnormalities. Left ventricular  diastolic function parameters   were normal. - Aortic valve: Mildly calcified annulus. Trileaflet; mildly   thickened leaflets. Valve area (VTI): 2.07 cm^2. Valve area   (Vmax): 2.2 cm^2. - Atrial septum: No defect or patent foramen ovale was identified. - Technically adequate study.   11/2015 DSE ------------------------------------------------------------------- Stress echo results:     Left ventricular ejection fraction was normal at rest and with stress. Normal echo stress   05/2016 Nuclear stress [place] Severe gut uptake, limited study. No clear ischemia       11/2018 nuclear stress   There was no ST segment deviation noted during stress. The study is normal. There are no perfusion defects consistent with prior infarct or current ischemia This is a low risk study. The left ventricular ejection fraction is normal (55-65%).   Jan 2022 echo   IMPRESSIONS     1. Left ventricular ejection fraction, by estimation, is 65 to 70%. The  left ventricle has normal function.  The left ventricle has no regional  wall motion abnormalities. There is mild left ventricular hypertrophy.  Left ventricular diastolic parameters  were normal.   2. Right ventricular systolic function is normal. The right ventricular  size is normal. Tricuspid regurgitation signal is inadequate for assessing  PA pressure.   3. The mitral valve is grossly normal. Trivial mitral valve  regurgitation.   4. The aortic valve is tricuspid. Aortic valve regurgitation is trivial.   5. The inferior vena cava is normal in size with greater than 50%  respiratory variability, suggesting right atrial pressure of 3 mmHg.        09/2022 PFTs severe diffusion defect       Assessment and Plan   1.CAD - recent PCI as outlined above, continue DAPT at least 12 months likely longer given her extensive history -no recent symptoms, continue current meds     2. HTN -recent issues with low bp's, has improved to degree  off imdur  and lower dose beta blocker. Of note she takes zanaflex  tid - submit home bp log, continue to monitor   3. Hyperlipidemia -at goal, continue current meds   4. Acute on Chronic HFpEF -signs of volume overload, she will take her lasix  more regularlly - with increased edema and soft bp's repeat echo, has had right sided dysfunction from prior echos  5.Palpitations - check 7 day zio patch  Tanya Lowe, M.D.     [1]  Allergies Allergen Reactions   Lyrica [Pregabalin] Shortness Of Breath    Chest Pain    Bactrim  [Sulfamethoxazole -Trimethoprim ] Rash   Prednisone  Palpitations and Hypertension       [2]  Allergies Allergen Reactions   Lyrica [Pregabalin] Shortness Of Breath    Chest Pain    Bactrim  [Sulfamethoxazole -Trimethoprim ] Rash   Prednisone  Palpitations and Hypertension        "

## 2024-11-21 ENCOUNTER — Other Ambulatory Visit (HOSPITAL_COMMUNITY): Payer: Self-pay | Admitting: Family Medicine

## 2024-11-21 DIAGNOSIS — R942 Abnormal results of pulmonary function studies: Secondary | ICD-10-CM

## 2024-11-21 DIAGNOSIS — R911 Solitary pulmonary nodule: Secondary | ICD-10-CM

## 2024-11-28 ENCOUNTER — Ambulatory Visit

## 2024-12-09 ENCOUNTER — Ambulatory Visit (HOSPITAL_COMMUNITY)

## 2024-12-16 ENCOUNTER — Ambulatory Visit: Admitting: Nurse Practitioner

## 2025-06-13 ENCOUNTER — Telehealth: Admitting: Adult Health
